# Patient Record
Sex: Male | Born: 1945 | Race: White | Hispanic: No | Marital: Married | Smoking: Former smoker
Health system: Southern US, Community
[De-identification: ages and names within clinical notes are randomized; demographics above are authoritative.]

## PROBLEM LIST (undated history)

## (undated) DIAGNOSIS — T4145XA Adverse effect of unspecified anesthetic, initial encounter: Secondary | ICD-10-CM

## (undated) DIAGNOSIS — G4730 Sleep apnea, unspecified: Secondary | ICD-10-CM

## (undated) DIAGNOSIS — K219 Gastro-esophageal reflux disease without esophagitis: Secondary | ICD-10-CM

## (undated) DIAGNOSIS — F32A Depression, unspecified: Secondary | ICD-10-CM

## (undated) DIAGNOSIS — D751 Secondary polycythemia: Secondary | ICD-10-CM

## (undated) DIAGNOSIS — Z9889 Other specified postprocedural states: Secondary | ICD-10-CM

## (undated) DIAGNOSIS — T8859XA Other complications of anesthesia, initial encounter: Secondary | ICD-10-CM

## (undated) DIAGNOSIS — R0600 Dyspnea, unspecified: Secondary | ICD-10-CM

## (undated) DIAGNOSIS — I2510 Atherosclerotic heart disease of native coronary artery without angina pectoris: Secondary | ICD-10-CM

## (undated) DIAGNOSIS — R112 Nausea with vomiting, unspecified: Secondary | ICD-10-CM

## (undated) DIAGNOSIS — I10 Essential (primary) hypertension: Secondary | ICD-10-CM

## (undated) DIAGNOSIS — R911 Solitary pulmonary nodule: Secondary | ICD-10-CM

## (undated) DIAGNOSIS — Z9109 Other allergy status, other than to drugs and biological substances: Secondary | ICD-10-CM

## (undated) DIAGNOSIS — M1990 Unspecified osteoarthritis, unspecified site: Secondary | ICD-10-CM

## (undated) DIAGNOSIS — F329 Major depressive disorder, single episode, unspecified: Secondary | ICD-10-CM

## (undated) DIAGNOSIS — L719 Rosacea, unspecified: Secondary | ICD-10-CM

## (undated) DIAGNOSIS — E039 Hypothyroidism, unspecified: Secondary | ICD-10-CM

## (undated) HISTORY — DX: Dyspnea, unspecified: R06.00

## (undated) HISTORY — PX: NECK SURGERY: SHX720

## (undated) HISTORY — PX: BACK SURGERY: SHX140

## (undated) HISTORY — DX: Sleep apnea, unspecified: G47.30

## (undated) HISTORY — PX: POSTERIOR FUSION LUMBAR SPINE: SUR632

## (undated) HISTORY — DX: Solitary pulmonary nodule: R91.1

## (undated) HISTORY — DX: Rosacea, unspecified: L71.9

## (undated) HISTORY — PX: ESOPHAGOGASTRODUODENOSCOPY: SHX1529

## (undated) HISTORY — DX: Essential (primary) hypertension: I10

## (undated) HISTORY — DX: Atherosclerotic heart disease of native coronary artery without angina pectoris: I25.10

## (undated) HISTORY — PX: OTHER SURGICAL HISTORY: SHX169

## (undated) HISTORY — DX: Gastro-esophageal reflux disease without esophagitis: K21.9

---

## 1997-06-28 HISTORY — PX: SHOULDER OPEN ROTATOR CUFF REPAIR: SHX2407

## 1998-06-28 HISTORY — PX: PROSTATE SURGERY: SHX751

## 1998-12-22 ENCOUNTER — Ambulatory Visit (HOSPITAL_BASED_OUTPATIENT_CLINIC_OR_DEPARTMENT_OTHER): Admission: RE | Admit: 1998-12-22 | Discharge: 1998-12-22 | Payer: Self-pay | Admitting: Urology

## 2000-08-15 ENCOUNTER — Ambulatory Visit (HOSPITAL_COMMUNITY): Admission: RE | Admit: 2000-08-15 | Discharge: 2000-08-15 | Payer: Self-pay | Admitting: Orthopedic Surgery

## 2000-08-15 ENCOUNTER — Encounter: Payer: Self-pay | Admitting: Orthopedic Surgery

## 2002-04-28 HISTORY — PX: CERVICAL FUSION: SHX112

## 2002-05-03 ENCOUNTER — Encounter: Payer: Self-pay | Admitting: Neurological Surgery

## 2002-05-07 ENCOUNTER — Encounter: Payer: Self-pay | Admitting: Neurological Surgery

## 2002-05-08 ENCOUNTER — Inpatient Hospital Stay (HOSPITAL_COMMUNITY): Admission: AD | Admit: 2002-05-08 | Discharge: 2002-05-10 | Payer: Self-pay | Admitting: Neurological Surgery

## 2002-05-09 ENCOUNTER — Encounter: Payer: Self-pay | Admitting: Neurological Surgery

## 2003-05-16 ENCOUNTER — Ambulatory Visit (HOSPITAL_COMMUNITY): Admission: RE | Admit: 2003-05-16 | Discharge: 2003-05-16 | Payer: Self-pay | Admitting: Neurological Surgery

## 2003-07-02 ENCOUNTER — Inpatient Hospital Stay (HOSPITAL_COMMUNITY): Admission: RE | Admit: 2003-07-02 | Discharge: 2003-07-11 | Payer: Self-pay | Admitting: Neurological Surgery

## 2004-06-28 HISTORY — PX: TRANSURETHRAL RESECTION OF PROSTATE: SHX73

## 2004-08-13 ENCOUNTER — Ambulatory Visit: Payer: Self-pay | Admitting: Internal Medicine

## 2004-08-19 ENCOUNTER — Ambulatory Visit: Payer: Self-pay

## 2004-08-28 ENCOUNTER — Encounter (INDEPENDENT_AMBULATORY_CARE_PROVIDER_SITE_OTHER): Payer: Self-pay | Admitting: Specialist

## 2004-08-28 ENCOUNTER — Inpatient Hospital Stay (HOSPITAL_COMMUNITY): Admission: RE | Admit: 2004-08-28 | Discharge: 2004-09-02 | Payer: Self-pay | Admitting: Urology

## 2005-03-18 ENCOUNTER — Ambulatory Visit: Payer: Self-pay | Admitting: Pulmonary Disease

## 2005-12-22 ENCOUNTER — Ambulatory Visit: Payer: Self-pay | Admitting: Gastroenterology

## 2005-12-26 HISTORY — PX: COLONOSCOPY: SHX174

## 2006-01-13 ENCOUNTER — Ambulatory Visit: Payer: Self-pay | Admitting: Gastroenterology

## 2006-03-16 ENCOUNTER — Ambulatory Visit: Payer: Self-pay

## 2006-06-28 HISTORY — PX: LUMBAR DISC SURGERY: SHX700

## 2006-07-05 ENCOUNTER — Observation Stay (HOSPITAL_COMMUNITY): Admission: RE | Admit: 2006-07-05 | Discharge: 2006-07-07 | Payer: Self-pay | Admitting: Neurological Surgery

## 2007-01-12 ENCOUNTER — Ambulatory Visit (HOSPITAL_COMMUNITY): Admission: RE | Admit: 2007-01-12 | Discharge: 2007-01-12 | Payer: Self-pay | Admitting: Neurological Surgery

## 2007-02-16 ENCOUNTER — Inpatient Hospital Stay (HOSPITAL_COMMUNITY): Admission: RE | Admit: 2007-02-16 | Discharge: 2007-03-01 | Payer: Self-pay | Admitting: Neurological Surgery

## 2007-03-01 ENCOUNTER — Ambulatory Visit: Payer: Self-pay | Admitting: Gastroenterology

## 2007-06-01 ENCOUNTER — Ambulatory Visit: Payer: Self-pay | Admitting: Internal Medicine

## 2007-07-14 DIAGNOSIS — I10 Essential (primary) hypertension: Secondary | ICD-10-CM | POA: Insufficient documentation

## 2007-07-14 DIAGNOSIS — K219 Gastro-esophageal reflux disease without esophagitis: Secondary | ICD-10-CM | POA: Insufficient documentation

## 2008-03-20 ENCOUNTER — Ambulatory Visit: Payer: Self-pay | Admitting: Internal Medicine

## 2008-03-20 DIAGNOSIS — G4733 Obstructive sleep apnea (adult) (pediatric): Secondary | ICD-10-CM | POA: Insufficient documentation

## 2008-03-20 DIAGNOSIS — R0989 Other specified symptoms and signs involving the circulatory and respiratory systems: Secondary | ICD-10-CM | POA: Insufficient documentation

## 2008-03-20 DIAGNOSIS — R635 Abnormal weight gain: Secondary | ICD-10-CM | POA: Insufficient documentation

## 2008-03-20 DIAGNOSIS — R0609 Other forms of dyspnea: Secondary | ICD-10-CM

## 2008-06-28 HISTORY — PX: CATARACT EXTRACTION, BILATERAL: SHX1313

## 2008-07-29 ENCOUNTER — Ambulatory Visit: Payer: Self-pay | Admitting: Internal Medicine

## 2008-08-09 ENCOUNTER — Ambulatory Visit: Payer: Self-pay

## 2008-08-22 ENCOUNTER — Ambulatory Visit: Payer: Self-pay | Admitting: Internal Medicine

## 2008-08-22 LAB — CONVERTED CEMR LAB
Basophils Absolute: 0.1 10*3/uL (ref 0.0–0.1)
Calcium: 9.6 mg/dL (ref 8.4–10.5)
GFR calc Af Amer: 126 mL/min
Glucose, Bld: 120 mg/dL — ABNORMAL HIGH (ref 70–99)
INR: 1 (ref 0.8–1.0)
Lymphocytes Relative: 20.1 % (ref 12.0–46.0)
MCHC: 33.9 g/dL (ref 30.0–36.0)
Monocytes Absolute: 0.7 10*3/uL (ref 0.1–1.0)
Monocytes Relative: 9.1 % (ref 3.0–12.0)
Neutro Abs: 5.3 10*3/uL (ref 1.4–7.7)
Platelets: 241 10*3/uL (ref 150–400)
Potassium: 4.5 meq/L (ref 3.5–5.1)
Prothrombin Time: 11 s (ref 10.9–13.3)
RDW: 15.6 % — ABNORMAL HIGH (ref 11.5–14.6)
Sodium: 138 meq/L (ref 135–145)

## 2008-08-26 ENCOUNTER — Inpatient Hospital Stay (HOSPITAL_BASED_OUTPATIENT_CLINIC_OR_DEPARTMENT_OTHER): Admission: RE | Admit: 2008-08-26 | Discharge: 2008-08-26 | Payer: Self-pay | Admitting: Internal Medicine

## 2008-08-26 ENCOUNTER — Ambulatory Visit: Payer: Self-pay | Admitting: Internal Medicine

## 2008-08-26 HISTORY — PX: MENISCUS REPAIR: SHX5179

## 2008-08-26 HISTORY — PX: CARDIAC CATHETERIZATION: SHX172

## 2008-09-09 ENCOUNTER — Ambulatory Visit: Payer: Self-pay | Admitting: Internal Medicine

## 2008-09-09 ENCOUNTER — Encounter (INDEPENDENT_AMBULATORY_CARE_PROVIDER_SITE_OTHER): Payer: Self-pay | Admitting: *Deleted

## 2008-09-13 ENCOUNTER — Ambulatory Visit (HOSPITAL_COMMUNITY): Admission: RE | Admit: 2008-09-13 | Discharge: 2008-09-14 | Payer: Self-pay | Admitting: Orthopedic Surgery

## 2008-12-06 ENCOUNTER — Encounter: Payer: Self-pay | Admitting: Internal Medicine

## 2009-01-23 ENCOUNTER — Ambulatory Visit (HOSPITAL_COMMUNITY): Admission: RE | Admit: 2009-01-23 | Discharge: 2009-01-23 | Payer: Self-pay | Admitting: Neurological Surgery

## 2009-03-14 ENCOUNTER — Telehealth (INDEPENDENT_AMBULATORY_CARE_PROVIDER_SITE_OTHER): Payer: Self-pay | Admitting: *Deleted

## 2009-04-14 ENCOUNTER — Telehealth (INDEPENDENT_AMBULATORY_CARE_PROVIDER_SITE_OTHER): Payer: Self-pay | Admitting: *Deleted

## 2009-04-16 ENCOUNTER — Encounter (INDEPENDENT_AMBULATORY_CARE_PROVIDER_SITE_OTHER): Payer: Self-pay | Admitting: *Deleted

## 2009-05-26 ENCOUNTER — Inpatient Hospital Stay (HOSPITAL_COMMUNITY): Admission: RE | Admit: 2009-05-26 | Discharge: 2009-06-01 | Payer: Self-pay | Admitting: Neurological Surgery

## 2009-06-18 ENCOUNTER — Telehealth (INDEPENDENT_AMBULATORY_CARE_PROVIDER_SITE_OTHER): Payer: Self-pay | Admitting: *Deleted

## 2009-06-18 ENCOUNTER — Ambulatory Visit: Payer: Self-pay | Admitting: Internal Medicine

## 2009-06-18 DIAGNOSIS — I2510 Atherosclerotic heart disease of native coronary artery without angina pectoris: Secondary | ICD-10-CM | POA: Insufficient documentation

## 2009-06-18 DIAGNOSIS — E782 Mixed hyperlipidemia: Secondary | ICD-10-CM | POA: Insufficient documentation

## 2009-10-14 ENCOUNTER — Ambulatory Visit (HOSPITAL_COMMUNITY): Admission: RE | Admit: 2009-10-14 | Discharge: 2009-10-14 | Payer: Self-pay | Admitting: Neurological Surgery

## 2010-06-08 ENCOUNTER — Encounter: Payer: Self-pay | Admitting: Internal Medicine

## 2010-06-08 ENCOUNTER — Ambulatory Visit: Payer: Self-pay | Admitting: Internal Medicine

## 2010-06-17 ENCOUNTER — Encounter: Payer: Self-pay | Admitting: Internal Medicine

## 2010-07-30 NOTE — Assessment & Plan Note (Signed)
Summary: per check out/sf appt is 11:15/ gd   Primary Provider:  Shaune Pollack  CC:  1 year ROV; Had Pressure 1 month ago.  History of Present Illness: Patient is a 65 year old wiht a history of mild CAD (cath in 2010 with 30% lesions), dyslipidemia, sleep apnea, HTN and GERD.  I saw him 1 year ago. Since I saw him he has had rare atpical CP. Breathin is unchanged.  His biggest complaints are hurting all over.  Activity is limited by musculoskeletal complaints. He has sleep apnea.  Has not tolerated CPAP.  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 Once Daily 2)  Vesicare 10 Mg  Tabs (Solifenacin Succinate) .... Once Daily 3)  Miralax   Pack (Polyethylene Glycol 3350) .... Two Times A Day 4)  Nexium 40 Mg Cpdr (Esomeprazole Magnesium) .[name].. 1 Tab Once Daily 5)  Lisinopril-Hydrochlorothiazide 10-12.5 Mg Tabs (Lisinopril-Hydrochlorothiazide) .[name].. 1 Tab Once Daily 6)  Iron Pills .... 2 Tabs Daily 7)  Androgel Pump 1 % Gel (Testosterone) .... 8 Pumps Daily 8)  Simvastatin 20 Mg Tabs (Simvastatin) .... Take One Tablet By Mouth Daily At Bedtime 9)  Percocet 5-325 Mg Tabs (Oxycodone-Acetaminophen) .... 2 Tabs Q 4 Hours As Needed 10)  Valium 5 Mg Tabs (Diazepam) .[name].. 1 Tab Q 6hours 11)  Aspirin 81 Mg Tbec (Aspirin) .... Take One Tablet By Mouth Daily  Current Medications (verified): 1)  Synthroid 150 Mcg  Tabs (Levothyroxine Sodium) .... Once Daily 2)  Vesicare 10 Mg  Tabs (Solifenacin Succinate) .... Once Daily 3)  Miralax   Pack (Polyethylene Glycol 3350) .... Once Daily 4)  Prilosec 20 Mg Cpdr (Omeprazole) .... Take 1 By Mouth Once Daily 5)  Lisinopril-Hydrochlorothiazide 10-12.5 Mg  Tabs (Lisinopril-Hydrochlorothiazide) .[name].. 1 Tab Once Daily 6)  Iron Pills .... 2 Tabs Daily 7)  Androgel Pump 1 % Gel (Testosterone) .... 8 Pumps Daily 8)  Simvastatin 20 Mg Tabs (Simvastatin) .... Take One Tablet By Mouth Daily At Bedtime 9)  Vicodin 5-500 Mg Tabs (Hydrocodone-Acetaminophen) .... Take 1 By Mouth As Needed 10)  Aspirin 81 Mg Tbec (Aspirin) .... Take One Tablet By Mouth Two Times A Day 11)  Citalopram Hydrobromide 20 Mg Tabs (Citalopram Hydrobromide) .... Take 1 By Mouth Once Daily  Allergies: 1)  ! Sulfa 2)  ! Penicillin 3)  ! Codeine  Past History:  Past medical, surgical, family and social histories (including risk factors) reviewed, and no changes noted (except as noted below).  Past Medical History: Reviewed history from 06/18/2009 and no changes required.  OBSTRUCTIVE SLEEP APNEA (ICD-327.23)     - can't wear cpap HYPERTENSION (ICD-401.9) G E R D (ICD-530.81) Dyspnea   - PFTs completely normal 03/20/08 including DLC0 Chest pain CAD  Past Surgical History: Reviewed history from 07/14/2007 and no changes required. history of neck and back fusions 3 prostate surgeries hx of shoulder surgery  Family History: Reviewed history from 03/20/2008 and no changes required. neg resp dz  Social History: Reviewed history from 03/20/2008 and no changes required. Former smoker.  Quit in 1980's.  Smoked for 15 years 1 to 2 ppd. Married Works for [employer]  Review of [employer]  ALl systems reviewed.  Neg to the above problem except as noted above.  Vital Signs:  Patient profile:   65 year old male Height:      71 inches Weight:      259 pounds BMI:     36.25 Pulse rate:   80 / minute Pulse rhythm:   regular BP sitting:   120 / 90  (right arm) Cuff size:   regular  Vitals Entered By: Stanton Kidney, EMT-P (June 08, 2010 10:55 AM)  Physical Exam  Additional Exam:  Patient is in NAD HEENT:  Normocephalic, atraumatic. EOMI, PERRLA.  Neck:  JVP is normal. Lungs: clear to auscultation. No rales no wheezes.  Heart: Regular rate and rhythm. Normal S1, S2. No S3.   No significant murmurs. PMI not displaced.  Abdomen:  Supple, nontender. Normal bowel sounds. No masses. No hepatomegaly.  Extremities:   Good distal pulses throughout. No lower extremity edema.  Neuro:   alert and oriented x3.    EKG  Procedure date:  06/08/2010  Findings:      NSR.  80 bpm.    Occas PAC.  Impression & Recommendations:  Problem # 1:  CAD, NATIVE VESSEL (ICD-414.01) Mild by cath.  I am not convinced he is having any active angina.  No change in regimen.  Problem # 2:  HYPERLIPIDEMIA-MIXED (ICD-272.4) Assessment: Improved Will need to f/u on lipids. His updated medication list for this problem includes:    Simvastatin 20 Mg Tabs (Simvastatin) .[name]... Take one tablet by mouth daily at bedtime  Orders: EKG w/ Interpretation (93000)  Problem # 3:  OBSTRUCTIVE SLEEP APNEA (ICD-327.23) Encouraged him to try to use CPAP.    Problem # 4:  HYPERTENSION (ICD-401.9) Diastolic at 90.  Systolic good.  Would not make changes for now.  Follow. His updated medication list for this problem includes:    Lisinopril-hydrochlorothiazide 10-12.5 Mg Tabs (Lisinopril-hydrochlorothiazide) .[name]... 1 tab once daily    Aspirin 81 Mg Tbec (Aspirin) .[name]... Take one tablet by mouth two times a day  Patient Instructions: 1)  Your physician recommends that you schedule a follow-up appointment in: 1 year with Dr. Tenny Craw 2)  Your physician recommends that you have lab work drawn with  primary care doctor. 3)  Your physician recommends that you continue on your current medications as directed. Please refer to the Current Medication list given to you today. Prescriptions: SIMVASTATIN 20 MG TABS (SIMVASTATIN) Take one tablet by mouth daily at bedtime  #90 x 3   Entered by:   Lisabeth Devoid RN   Authorized by:   Sherrill Raring, MD, [facility]   Signed by:   Lisabeth Devoid RN on  06/08/2010   Method used:   Faxed to ...       [facility] (mail-order)             , [place]         Ph: [phone]       Fax: [phone]   RxID:   [id number]

## 2010-09-17 ENCOUNTER — Other Ambulatory Visit: Payer: Self-pay | Admitting: Oncology

## 2010-09-17 ENCOUNTER — Encounter (HOSPITAL_BASED_OUTPATIENT_CLINIC_OR_DEPARTMENT_OTHER): Payer: 59 | Admitting: Oncology

## 2010-09-17 DIAGNOSIS — D751 Secondary polycythemia: Secondary | ICD-10-CM

## 2010-09-17 LAB — CBC WITH DIFFERENTIAL/PLATELET
BASO%: 0.3 % (ref 0.0–2.0)
EOS%: 1.9 % (ref 0.0–7.0)
HGB: 16.5 g/dL (ref 13.0–17.1)
MCH: 32.3 pg (ref 27.2–33.4)
MCHC: 33.4 g/dL (ref 32.0–36.0)
RDW: 14.1 % (ref 11.0–14.6)
WBC: 5.9 10*3/uL (ref 4.0–10.3)
lymph#: 1 10*3/uL (ref 0.9–3.3)

## 2010-09-18 LAB — COMPREHENSIVE METABOLIC PANEL
ALT: 18 U/L (ref 0–53)
AST: 18 U/L (ref 0–37)
Albumin: 4 g/dL (ref 3.5–5.2)
Alkaline Phosphatase: 56 U/L (ref 39–117)
Potassium: 4.3 mEq/L (ref 3.5–5.3)
Sodium: 135 mEq/L (ref 135–145)
Total Protein: 7.3 g/dL (ref 6.0–8.3)

## 2010-09-29 LAB — CBC
HCT: 39.9 % (ref 39.0–52.0)
Hemoglobin: 13.4 g/dL (ref 13.0–17.0)
RBC: 4.49 MIL/uL (ref 4.22–5.81)
WBC: 10.3 10*3/uL (ref 4.0–10.5)

## 2010-09-29 LAB — BASIC METABOLIC PANEL
Calcium: 8 mg/dL — ABNORMAL LOW (ref 8.4–10.5)
GFR calc Af Amer: >60 mL/min (ref >60)
GFR calc non Af Amer: >60 mL/min (ref >60)
Glucose, Bld: 114 mg/dL — ABNORMAL HIGH (ref 70–99)
Potassium: 4.1 mEq/L (ref 3.5–5.1)
Sodium: 132 mEq/L — ABNORMAL LOW (ref 135–145)

## 2010-09-30 LAB — CBC
HCT: 43.5 % (ref 39.0–52.0)
Hemoglobin: 14.3 g/dL (ref 13.0–17.0)
Platelets: 210 10*3/uL (ref 150–400)
RBC: 4.93 MIL/uL (ref 4.22–5.81)
RDW: 18.9 % — ABNORMAL HIGH (ref 11.5–15.5)

## 2010-09-30 LAB — BASIC METABOLIC PANEL
CO2: 27 mEq/L (ref 19–32)
GFR calc Af Amer: >60 mL/min (ref >60)
GFR calc non Af Amer: >60 mL/min (ref >60)
Glucose, Bld: 99 mg/dL (ref 70–99)
Potassium: 4.3 mEq/L (ref 3.5–5.1)
Sodium: 137 mEq/L (ref 135–145)

## 2010-09-30 LAB — COMPREHENSIVE METABOLIC PANEL
AST: 27 U/L (ref 0–37)
Albumin: 3.9 g/dL (ref 3.5–5.2)
Alkaline Phosphatase: 63 U/L (ref 39–117)
Chloride: 103 mEq/L (ref 96–112)
GFR calc Af Amer: >60 mL/min (ref >60)
Potassium: 4.8 mEq/L (ref 3.5–5.1)
Total Bilirubin: 0.9 mg/dL (ref 0.3–1.2)

## 2010-09-30 LAB — TYPE AND SCREEN

## 2010-10-08 LAB — BASIC METABOLIC PANEL
BUN: 14 mg/dL (ref 6–23)
CO2: 27 mEq/L (ref 19–32)
GFR calc non Af Amer: >60 mL/min (ref >60)
Glucose, Bld: 103 mg/dL — ABNORMAL HIGH (ref 70–99)
Potassium: 4.3 mEq/L (ref 3.5–5.1)

## 2010-10-08 LAB — CBC
HCT: 42.7 % (ref 39.0–52.0)
MCHC: 32.8 g/dL (ref 30.0–36.0)
MCV: 82.8 fL (ref 78.0–100.0)
Platelets: 258 10*3/uL (ref 150–400)
RDW: 16.4 % — ABNORMAL HIGH (ref 11.5–15.5)

## 2010-11-10 NOTE — Discharge Summary (Signed)
NAMENAJEE, [name] [name]                 ACCOUNT NO.:  [id number]   MEDICAL RECORD NO.:  [id number]          PATIENT TYPE:  INP   LOCATION:  5114                         FACILITY:  [facility]   PHYSICIAN:  Stefani Dama, M.D.  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   FINAL DIAGNOSES:  1. Lumbar spondylosis and stenosis L3 to L5, status post arthrodesis      L5-S1 secondary to spondylolisthesis with lumbar radiculopathy and      myelopathy  2. Postoperative paralytic ileus.  3. Postoperative nausea.   HOSPITAL COURSE:  The patient is a 65 year old individual who has had  previous difficulties with lumbar spondylosis and spondylolisthesis,  having undergone a fusion at the L5-S1 level.  He developed increasing  lumbar radiculopathy and had a herniated nucleus pulposus at L3-L4 which  was operated on approximately eight months ago.  He was improving,  albeit very slowly and has had complaints of chronic radiculopathy.  Attempts to return him to the workplace were unsuccessful and the  patient has had increasing continual back pain for the past several  months.  Followup studies demonstrated that he was now developing  significant spondylitic changes at the L3-4 level and also at L4-5.  He  has had a moderate degree of stenosis that seemed to be worsening from  his previous MRIs.  After careful consideration of his options, I  advised regarding surgical decompression and arthrodesis from L3 down to  L5, thus he would have an arthrodesis from L3 to the sacrum.  Previously, the patient had had difficulties with postop paralytic  ileus, and after this the surgery, the condition was quite the same and  quite severe.  He was seen by the GI service and had a  gastrointestinal  consult where he was given multiple laxatives and enemas in an effort to  try to stimulate mobility in his bowels.  At the same time, he was  weaned as best possible from his narcotic pain medications.  He was  ambulated, albeit the patient's motivation in regards to ambulation was  quite limited.  Nonetheless, it took considerable amount of time for the  patient's bowels ultimately to breakthrough, and on the 2nd, he  complained significantly of having diarrhea stools.  Thing seemed to  improve, and by the morning of the 3rd, his diarrhea had largely  subsided.   His incision is clean and dry and healing well.  His motor function has  remained stable, although he feels generally weak.  He does have some  postoperative anemia with hematocrit down to 29.  He did not receive any  transfusions during this hospital stay.  Overall, we are pleased with  his progress and it is anticipated that he should have further smoother  recovery.   DISCHARGE MEDICATIONS:  He is discharged home with prescription for:  1. Percocet #60, without  refills.  2. Valium 5 mg, #40, with two refills.  3. Robaxin #40, 500 mg, with p.r.n. refills for muscle spasms.  4. MiraLax as a laxative to use twice a day.   DISCHARGE INSTRUCTIONS:  In addition, he will be seen by physical  therapy and occupational therapy after his discharged to home place.   CONDITION ON DISCHARGE:  Stable.      Stefani Dama, M.D.  Electronically Signed     HJE/MEDQ  D:  03/01/2007  T:  03/01/2007  Job:  [id number]

## 2010-11-10 NOTE — Assessment & Plan Note (Signed)
[facility]                             PULMONARY OFFICE NOTE   NAME:[name] [name], [name] [name]                        MRN:          [id number]  DATE:06/01/2007                            DOB:          02/23/1946    PULMONARY CONSULTATION   DATE OF VISIT:  June 01, 2007.   REQUESTING PHYSICIAN:  Duncan Dull, MD.   REASON FOR CONSULTATION:  Dyspnea.   HISTORY:  This is a 65 year old, white male, who quit smoking 30 years  ago and weighed about 180 pounds then.  He weighs 260 pounds now and  comes in with a history of an increasing cough and dyspnea since June of  2008, to the point where he is having trouble getting up a flight of  steps without stopping, although he can still do so without stopping at  the top.  He was much worse two weeks ago when Dr. Kevan Ny switched him  from Lisinopril to Benicar and he is some better today.  The cough is  dry in nature, worse as the day goes on, does not typically disturb his  sleep.  He denies any overt sinus or reflux symptoms, fevers, chills,  sweats, orthopnea, PND, or leg swelling.   PAST MEDICAL HISTORY:  1. Hypertension.  2. GERD complicated by a previous stricture needing dilatation.  3. He also is on a CPAP machine.   ALLERGIES:  1. PENICILLIN.  2. SULFA.  3. CODEINE.   MEDICATIONS:  Synthroid, Nexium, Benicar, Vesicare, aspirin, and  MiraLax.  For full medication dosing, please see medication face sheet  accommodated June 01, 2007.   SOCIAL HISTORY:  He quit smoking over 30 years ago, works for the [employer].   FAMILY HISTORY:  Taken in detail on the worksheet and negative for  atypia or asthma.   REVIEW OF SYSTEMS:  Taken also today in detail on the worksheet and  negative except as outlined above.   PHYSICAL EXAMINATION:  GENERAL:  This is an obese, ambulatory, white  male in no acute distress.  HEENT:  Unremarkable, oropharynx clear.  He does have frequent throat  clearing,  however, and slight hoarseness with voice fatigue.  Nasal  turbinates reveal moderate edema with mucopurulent secretions.  NECK:  Supple without cervical adenopathy or tenderness, trachea is  midline.  CHEST:  The lung fields reveal decreased breath sounds, but no wheeze.  There is no cough elicited on inspiratory or expiratory maneuvers.  HEART:  There is a regular rhythm with a distant S1 S2.  ABDOMEN:  Massively obese as well as benign with a positive end  inspiratory Hoover's sign.  EXTREMITIES:  Warm without calf tenderness, cyanosis, clubbing, or  edema.   LABORATORY DATA:  His office records were reviewed from November 14th  and indicate that is when the change was made to Benicar.   IMPRESSION:  1. New onset cough in a patient, who has documented reflux complicated      by stricture and occurred while the patient was on ACE inhibitors.      While not documented  in the literature, I have found that reflux      and ACE inhibitors go together like gasoline and matches.  Neither      is a problem until the match is struck by something like an      endotracheal tube or a viral upper respiratory infection, and then      the inflammation in the upper airway that ensues this can be quite      problematic.   I therefore am not only recommending elimination of ACE inhibitors  indefinitely but continued aggressive treatment directed at reflux with  Nexium b.i.d. and a diet that I reviewed with him.   1. Clearly some of his chronic dyspnea is related to obesity with      deconditioning and may be related to chronic obstructive pulmonary      disease.  I have asked him to return in four to six weeks for a set      of pulmonary function tests to sort through this and emphasized      paced exercise as a way to begin to overcome this problem.  I do      not believe any bronchodilators at this point are necessary and may      risk aggravating the upper airway component of the cough.      Charlaine Dalton. Sherene Sires, MD, [facility]  Electronically Signed    MBW/MedQ  DD: 06/01/2007  DT: 06/01/2007  Job #: [id number]   cc:   Duncan Dull, M.D.

## 2010-11-10 NOTE — Op Note (Signed)
NAME:  [name] [name], [name] [name]                 ACCOUNT NO.:  [id number]   MEDICAL RECORD NO.:  [id number]          PATIENT TYPE:  OIB   LOCATION:  5012                         FACILITY:  [facility]   PHYSICIAN:  Dyke Brackett, M.D.    DATE OF BIRTH:  1946/06/26   DATE OF PROCEDURE:  09/13/2008  DATE OF DISCHARGE:                               OPERATIVE REPORT   INDICATIONS:  A 65 year old with intractable knee pain.  MRI showing  tear of medial meniscus of the left knee with osteoarthritis, requiring  inpatient evaluation due to severe sleep apnea.   PREOPERATIVE DIAGNOSES:  1. Complex tear, posterior horn of the medial meniscus, left knee.  2. Patellofemoral chondromalacia, grade 3.  3. Grade 3 chondromalacia of medial compartment.   OPERATION:  1. Partial medial meniscectomy.  2. Debridement and chondroplasty of patellofemoral joint.  3. Debridement and chondroplasty of medial compartment.   SURGEON:  Dyke Brackett, MD   ANESTHESIA:  General with local.   Arthroscopic portals were created inferomedially and inferolaterally in  the leg holders.  The patellofemoral joint showed some generalized  chondromalacia, early grade 3 changes more on the medial side, some  involvement of the trochlear groove.  This was debrided and separated  from the medial compartment, lateral compartment and lateral meniscus  was normal.  Medial compartment showed a complex tear of the posterior  horn of the meniscus, resection 30-40%, meniscus substance with  generalized advanced grade 3 changes on the most of the weightbearing  surface of the femur with a less involvement, but still with some grade  3 changes on the tibia.  Again, two compartments were debrided that are  patellofemoral joint and medial compartment.  This was partially medial  meniscectomy __________closed portals  with nylon.  Portals were  injected with Marcaine with addition of 40 mg of Depo-Medrol into the  joint for a total of 30 mL, 0.5%  Marcaine with epinephrine.  Taken to  recovery room in stable condition,  after light compressive sterile  dressing applied.      Dyke Brackett, M.D.  Electronically Signed     WDC/MEDQ  D:  09/13/2008  T:  09/14/2008  Job:  [id number]

## 2010-11-10 NOTE — Assessment & Plan Note (Signed)
[facility]                            CARDIOLOGY OFFICE NOTE   NAME:[name] [name], [name] [name]                        MRN:          [id number]  DATE:07/29/2008                            DOB:          09-15-1945    IDENTIFICATION:  [name] [name] is a 65 year old gentleman who I saw last  time back in 2006.  He had a history of intermittent chest discomfort.  He returns today in referral from Dr. Kevan Ny for evaluation of chest  pains.   On talking to him, he is vagued, but he states that he occasionally feel  a vibrating sensation in his chest almost like he has a cellphone and  they are on vibrate.  Denies dizziness.  He is not that active because  of musculoskeletal issues.  Denies any episodes that wake him.  I do not  get a clear sense if there is an association with activity.   CURRENT MEDICINES:  1. Synthroid daily.  2. VESIcare daily.  3. Aspirin 81.  4. MiraLax daily.  5. Prilosec daily.  6. Lisinopril HCTZ 10/12.5.  7. Etodolac 500 b.i.d.  8. Z-PAK p.r.n.   PHYSICAL EXAMINATION:  GENERAL:  The patient is in no distress.  VITAL SIGNS:  Blood pressure 119/71, pulse is 80 and regular, and weight  248.  LUNGS:  Clear with good air flow.  No rales.  CARDIAC:  Regular rate and rhythm, S1 and S2.  No S3, no significant  murmurs.  ABDOMEN:  Benign.  EXTREMITIES:  No edema.   A 12-lead EKG; normal sinus rhythm at 78 beats per minute, left anterior  fascicular block, LVH.   IMPRESSION:  #1.  Chest pain.  The patient with no known coronary artery  disease.  My last Myoview in 2006 was unchanged from the one in 2003  shows inferior thinning defect consistent with scar with mild peri-  infarct ischemia may indeed note that there may be some soft tissue  attenuation.   I would recommend getting another adenosine Myoview.  The patient's pain  is atypical, but would schedule to reevaluate and compare.  He is being  scheduled for knee surgery and this will  be prior to preop risk  stratification.   I would keep him on his current regimen.  #2.  Hypertension, adequately controlled.  #3.  Cardiac risk factors.  He will be in touch with Dr. Kevan Ny' office  regarding lipid panel.     Pricilla Riffle, MD, [facility]  Electronically Signed    PVR/MedQ  DD: 07/29/2008  DT: 07/30/2008  Job #: [id number]   cc:   Duncan Dull, M.D.

## 2010-11-10 NOTE — Cardiovascular Report (Signed)
NAMEHAROUN, COTHAM                 ACCOUNT NO.:  [id number]   MEDICAL RECORD NO.:  [id number]          PATIENT TYPE:  OIB   LOCATION:  1962                         FACILITY:  [facility]   PHYSICIAN:  Bevelyn Buckles. Bensimhon, MDDATE OF BIRTH:  03/08/1946   DATE OF PROCEDURE:  08/26/2008  DATE OF DISCHARGE:  08/26/2008                            CARDIAC CATHETERIZATION   PRIMARY CARDIOLOGIST:  Pricilla Riffle, MD, [facility]   THE PATIENT IDENTIFICATION:  Mr. Pavek is a 65 year old male with  history of hypertension and sleep apnea.  He has been having  intermittent chest discomfort.  He saw Dr. Tenny Craw, underwent adenosine  Myoview on June 08, 2008.  This showed evidence of an inferior wall  scar with some mild peri-infarct ischemia.  He is thus sent for a  diagnostic catheterization in the outpatient lab.   PROCEDURES PERFORMED:  1. Selective coronary angiography.  2. Left heart catheterization.  3. Left ventriculogram.   DESCRIPTION OF THE PROCEDURE:  The risks and indication were explained,  consent was signed and placed on the chart, the right groin area was  prepped and draped in routine sterile fashion, anesthetized with 1%  local lidocaine.  A 4-French arterial sheath was placed in the right  femoral artery using a modified Seldinger technique.  A JL5, 3DRC, and  angled pigtail catheter were used for the procedure.  All catheter  exchanges were made over wire.  There are no apparent complications.   Central aortic pressure was 111/72 with a mean of 90.  LV pressure was  109/5 and EDP of 10.  There was no aortic stenosis.   Left main was normal.   LAD was a long vessel, wrapping the apex, gave off to moderate-sized  diagonals.  There is a long 30% tubular lesion in the midsection.   Left circumflex gave off a large ramus and 2 small OMs.  They were  angiographically normal.   Right coronary artery was a large dominant vessel, gave off a PDA and 2  posterolaterals.  There is a 30%  lesion in the proximal RCA.   Left ventriculogram done in the RAO position showed an EF of 55-60%.  There was some question of a mild wall motion defect in the mid anterior  wall.  Otherwise, I suspect this is just related to ectopy.   ASSESSMENT:  1. Mild nonobstructive coronary artery disease.  2. Normal left ventricular function.   PLAN:  Will be for medical therapy with continued risk factor  management.  He will follow up with Dr. Tenny Craw.      Bevelyn Buckles. Bensimhon, MD  Electronically Signed     DRB/MEDQ  D:  08/26/2008  T:  08/26/2008  Job:  [id number]

## 2010-11-10 NOTE — Op Note (Signed)
NAMECHRISTOPHR, [name] [name]                 ACCOUNT NO.:  [id number]   MEDICAL RECORD NO.:  [id number]          PATIENT TYPE:  INP   LOCATION:  3108                         FACILITY:  [facility]   PHYSICIAN:  Stefani Dama, M.D.  DATE OF BIRTH:  02/23/1946   DATE OF PROCEDURE:  02/16/2007  DATE OF DISCHARGE:                               OPERATIVE REPORT   PREOPERATIVE DIAGNOSIS:  Spondylosis and stenosis L3-L4 and L4-L5 status  post arthrodesis L5-S1.   POSTOPERATIVE DIAGNOSIS:  Spondylosis and stenosis L3-L4 and L4-L5  status post arthrodesis L5-S1.   OPERATION:  Decompression L3-L4 and L4-L5 via laminectomy of L3 and L4,  total discectomy L3-L4 and L4-L5, decompress L3, L4, and L5 nerve roots  bilaterally, posterior interbody arthrodesis L3-L4 and L4-L5 with PEEK  spacers, local autograft and allograft, segmental fixation L3 to L5,  posterolateral arthrodesis L3 to L5 with local autograft and allograft,  removal of previous hardware L5-S1.   SURGEON:  Stefani Dama, M.D.   FIRST ASSISTANT:  Cristi Loron, M.D.   ANESTHESIA:  General endotracheal.   INDICATIONS:  [name] [name] is a 65 year old individual who has had  significant problems with his back in the past.  He has advanced  spondylitic changes with stenosis in the lateral recess, nerve root  compromise at the L5-S1 level, and underwent fusion, developed a  herniated nucleus pulposus at L3-L4 causing a severe left lumbar  radiculopathy.  This had been decompressed and since that time, the  patient has developed additional spondylitic changes with retrolisthesis  of L3-L4 causing significant stenosis at that level and spondylitic  changes at L4-L5 with, again, lateral recess stenosis noted there.  The  patient was advised regarding surgical decompression and is taken to the  operating room now to undergo decompression and stabilization from L3 to  L5 with removal of the hardware.   PROCEDURE:  The patient was brought to the  operating room supine on the  stretcher. After the smooth induction of general endotracheal  anesthesia, he was turned prone.  The back was prepped with alcohol and  DuraPrep and draped in a sterile fashion.  A midline incision was  creating and carried down to the lumbodorsal fascia which was opened on  either side of the midline to expose the L3 lamina and L4 lamina in  addition to exposing the hardware at L5-S1.  This was Synthes hardware  that was placed a number of years ago and the hardware was uncovered and  then the central portion of the screws were loosened, rods were removed,  and then the pedicle screws were removed.  The fusion was noted be solid  at L5-S1 and then we proceeded with the laminectomy to decompress L3, L4  and also the L5 nerve roots.   Once the laminectomies were removed, a complete facetectomy was  performed at the L3-L4 and L4-L5 spaces and the disc space was then  entered, first on the left side where there had been a previous  extraforaminal discectomy, then the bulk of the retrolisthesis was noted  at the L3-L4 level.  This area was decompressed and a complete  discectomy was performed on the left side and a spacer was then placed  into the disc space.  This was an 8 mm spacer.  Discectomy was then  performed at L4-L5 on the left side and a similar 8 mm spacer could be  placed into this disc space which was noted to be markedly deteriorated.  Right sided discectomies were then performed removing the bulk of the  disc material from within the disc space and clearing over to the  opposite side.  Disc shapers were then used to remove cartilaginous  endplate and to size the intervertebral space at the L3-L4 level, 8 mm  PEEK spacers could be placed into the interspace, and these were then  prepared, filled with a combination of autograft and some strips of  Infuse.  The spacers were then placed into the interspace at L3-L4. At  L4-L5, 9 mm spacers were placed  in same fashion after complete  discectomy was performed.   Once the interbody portion of the procedure was completed, additional  bone graft was placed into the interbody space along with some small  strips of Infuse. Once this was completed, care was taken to again check  that the L3, the L4, and the L5 nerve roots were each well decompressed.  Then, using fluoroscopic guidance, pedicle screws were placed in L3, L4,  and L5.  These were 6.5 x 50 mm screws.  The left sided screw in L5 was  re-tapped more medially to obtain better bony purchase and a 6.5 x 50 mm  screw was placed into this pedicle.  On the right side, the previously  used pedicle opening was secured again with 50 mm 6.5 diameter screw. 60  mm pre-contoured rods were then fitted between the screw heads and  provisionally tightened.  Fluoroscopic images were obtained in AP and  lateral projections. Prior to placement of the hardware, the lateral  gutters had been decorticated and were packed off with sponges.  The  sponges had been removed.  At this time, additional Infuse and autograft  was placed into the lateral gutters.  The system was then tightened  completely and torqued into position.  A transverse connector was placed  at the bottom at L4-L5.   With the hardware being placed, the area was checked carefully for  hemostasis.  Once this was secured, the lumbodorsal fascia was closed  with #1 Vicryl in an interrupted fashion, 2-0 Vicryl was used in the  subcutaneous tissues, and 3-0 Vicryl subcuticularly.  A dry sterile  dressing was placed on the patient's back.  Blood loss was estimated at  600 mL, 200 mL of Cell Saver blood was returned to the patient.      Stefani Dama, M.D.  Electronically Signed     HJE/MEDQ  D:  02/16/2007  T:  02/17/2007  Job:  [id number]

## 2010-11-10 NOTE — Assessment & Plan Note (Signed)
[name]                            CARDIOLOGY OFFICE NOTE   NAME:Babiarz, [name] [name]                        MRN:          [id number]  DATE:09/09/2008                            DOB:          12-18-1945    IDENTIFICATION:  [name] is a 65 year old gentleman.  He had a  history of intermittent chest discomfort, hypertension.  I last saw him  in early February.   The patient complained of some chest tightness, vibrating sensation.  With this and his lack of activity because of DJD, I scheduled him for a  Myoview scan.  This was done on August 09, 2008, that showed inferior  defect consistent with mild scar with mild-to-moderate peri-infarct  ischemia.  Note, he had had some of these changes in the past.   Based on these findings, I went ahead and recommend a cardiac  catheterization to define his anatomy, especially since he was being  evaluated for a knee surgery.   Cardiac catheterization was done on August 26, 2008.  This showed a 30%  RCA lesion, 30% LAD lesion.  LVEF was normal.   Since seen, the patient has been doing okay.  Denies chest pain.   CURRENT MEDICINES:  1. Synthroid 150.  2. VESIcare 10.  3. Aspirin on hold.  4. MiraLax daily.  5. Prilosec daily.  6. Lisinopril hydrochlorothiazide 10/12.5 daily.  7. VPAP (the patient cannot tolerate).   PHYSICAL EXAMINATION:  GENERAL:  On exam, the patient is in no distress.  VITAL SIGNS:  Blood pressure is 118/72, pulse is 76 and regular, weight  249.  NECK:  JVP is normal.  LUNGS:  Clear with no rales or wheezes.  CARDIAC:  Regular rate and rhythm, S1 and S2.  No S3, no significant  murmurs.  ABDOMEN:  Benign.  Obese.  EXTREMITIES:  No edema.   IMPRESSION:  1. Chest pains.  Cath with minimal disease.  I would began him on a      statin, Zocor 20 to prevent progression, check lipids in 2-1/2      months (note in December, his LDL was 1034, HDL of 38).  Activity      will be  difficult with his back problems.  2. Hypertension, good control.  3. Sleep apnea.  The patient was seen by Dr. Fraser Din in the past.  He      is not tolerating the VPAP.  I offered that he be seen by one of      our pulmonologist after his orthopedic work done.  I do think it is      important.  Examination does show prominent tonsils not occluding      the upper airway.  He has been seen by Dr. Haroldine Laws also for      consideration for surgery.  No decision was made.   The patient is holding aspirin because of the upcoming surgery.   I will set to see him back in December but will be in touch with him  regarding blood work.     Pricilla Riffle, MD, [facility]  Electronically Signed    PVR/MedQ  DD: 09/09/2008  DT: 09/10/2008  Job #: [id number]

## 2010-11-10 NOTE — Assessment & Plan Note (Signed)
[facility]                            CARDIOLOGY OFFICE NOTE   NAME:Massey, YESENIA FONTENETTE                        MRN:          [id number]  DATE:08/21/2008                            DOB:          [birth year]    IDENTIFICATION:  Mr. Vasek is a 65 year old gentleman who I followed  in the past.  He has a history of intermittent chest pain, hypertension,  hypothyroidism.  He also has a history of sleep apnea.   I saw the patient on February 1 of this year.  He was complaining of  some vague vibrating sensations in his chest, did not say it was a chest  pain.  He is not that active, limited because of musculoskeletal issues.  I do not get a clear sense there was an association with activity.   With this, I went ahead and scheduled him for an adenosine Myoview.  He  had this done on June 08, 2009.  There were no EKG changes with  infusion.  Myoview scan showed a scar in the inferior wall with some  evidence of peri-infarct ischemia, mild to moderate.  Note, he has had  decreased counts in the inferior wall in the past that had been  attributed to scar and possible soft tissue attenuation without  significant ischemia.  Comparison of these three studies, scan from  February 12, improvement was prominent.   Based on this, we will plan for cardiac catheterization to define his  anatomy once in for all.   ALLERGIES:  CODEINE, SULFA, PENICILLIN.   CURRENT MEDICINES:  1. Synthroid 150 mcg.  2. VESIcare 10.  3. Aspirin 81.  4. MiraLax.  5. Prilosec.  6. Lisinopril/hydrochlorothiazide 10/12.5.  7. Etodolac 500 b.i.d.  8. VPAP.  9. Vicodin p.r.n.   PAST MEDICAL HISTORY:  1. Hypothyroidism.  2. DJD.  3. History of hypertension.  4. History of sleep apnea.   SOCIAL HISTORY:  The patient is married, works, quit smoking almost 35  years ago, does not drink.   FAMILY HISTORY:  Father had a heart attack in his 50s.  Mother with  breast cancer.  No other  history of premature CAD.   REVIEW OF SYSTEMS:  All systems reviewed negative to the above problems  except as noted above.   PHYSICAL EXAMINATION:  VITAL SIGNS:  When I saw him on February 1, blood  pressure was 119/71, pulse was 80 and regular, weight 248 pounds.  HEENT:  Normocephalic, atraumatic, EOMI, PERRL.  NECK:  JVP was normal.  LUNGS:  Clear.  CARDIAC:  Regular rate and rhythm.  S1 and S2.  No S3.  No murmurs.  ABDOMEN:  Benign.  EXTREMITIES:  Good distal pulses.  No lower extremity edema.  NEURO:  Deferred.   A 12-lead EKG showed normal sinus rhythm, 78 beats per minute, left  anterior fascicular block.  LVH.   IMPRESSION:  Mr. Hreha is a 65 year old gentleman with no known  coronary disease.  He has had abnormal Myoview in the past without  definite ischemia but inferior changes consistent with scar, possible  soft  tissue attenuation.  He was complaining of vibrating sensation in  his chest.  To clarify this, another Myoview was done.  This one shows  more improvement in the inferior wall.   Based on this, I would recommend a cardiac catheterization to finally  define his anatomy.  Discussed this with the patient.  He is agreeable.  We will plan to have pre-cath labs done this week and a chest x-Elih, set  catheterization for Monday.     Pricilla Riffle, MD, [facility]  Electronically Signed    PVR/MedQ  DD: 08/21/2008  DT: 08/22/2008  Job #: [id number]

## 2010-11-13 NOTE — Op Note (Signed)
NAME:  [name], [name] [name]                           ACCOUNT NO.:  [id number]   MEDICAL RECORD NO.:  [id number]                   PATIENT TYPE:  OIB   LOCATION:  2894                                 FACILITY:  [facility]   PHYSICIAN:  Stefani Dama, M.D.               DATE OF BIRTH:  May 02, 1946   DATE OF PROCEDURE:  05/07/2002  DATE OF DISCHARGE:                                 OPERATIVE REPORT   PREOPERATIVE DIAGNOSES:  Cervical spondylosis with myelopathy, C3-4.  Cervical radiculopathy.   POSTOPERATIVE DIAGNOSES:  Cervical spondylosis with myelopathy, C3-4.  Cervical radiculopathy.   OPERATION PERFORMED:  Anterior cervical decompression, arthrodesis with  structural allograft, Synthes plate fixation.   SURGEON:  Stefani Dama, M.D.   ASSISTANT:  Hilda Lias, M.D.   ANESTHESIA:  General endotracheal.   INDICATIONS FOR PROCEDURE:  The patient is a 65 year old individual who has  had significant neck, shoulder and arm pain. He was found to have myopathic  cord compression at the level of C3 and C4.  He also had some mild  spondylosis elsewhere in the cervical spine including C5-C6.  Because of his  ongoing problems with dysesthesias and severe localized neck pain.  He was  advised regarding decompression of the cord at 3-4.   DESCRIPTION OF PROCEDURE:  The patient was brought to the operating room  supine on the stretcher.  After smooth induction of general endotracheal  anesthesia, he was placed in five pounds of halter traction.  The neck was  shaved, prepped with DuraPrep and draped in sterile fashion.  A transverse  incision was created in the upper portion of the neck and this was carried  down to the platysma.  The plain between the sternocleidomastoid muscle and  strap muscles was dissected bluntly until the first prevertebral space was  reached.  This was identified as C3-4.  The longus colli muscle was stripped  on either side so as to place a Agricultural engineer.  The  ventral aspect of C3-  4 was opened with a small rongeur and the disk space was noted to be  collapsed anteriorly.  A high speed air drill and a 2.3 mm dissecting tool  was used to open up this ventral aspect of the disk space.  Then a  significant quantity of severely degenerated disk material was removed.  Bony osteophytic overgrowth was encountered.  A self-retaining disk spreader  could be placed first on the right side then by clearing out the left side  of the disk space, it was alternately placed on the opposite side to allow  for some distraction of the disk space ultimately to get to the posterior  longitudinal ligament region.  It was noted that the patient had large  spondylitic overgrowths from the inferior margin of body of C3 and also the  uncinate processes laterally.  These were drilled down with a 2.3 mm  dissection tool.  Ultimately, the bone spurs were removed en bloc.  The  posterior longitudinal ligament was noted to be thickened and redundant.  This was removed in piecemeal fashion to expose the common dural tube.  Ventral osteophytes were also removed from the superior margin of the body  of C4 centrally.  Once these were decompressed, the lateral edges were  decompressed.  Care was taken to assure hemostasis in the epidural  compartment and then a 7 mm tricortical graft  was fashioned with the  cortical surface facing dorsally.  Once this was secured, the ventral  aspects of the graft were trimmed and 18 mm standard size Synthes plate was  affixed to the anterior aspect of the vertebral body of C3 and C4 locking 4  x 14 mm screws.  This was checked for position  radiographically.  When it was felt to be adequate, the platysma was closed  with 3-0 Vicryl in interrupted fashion.  3-0 Vicryl was used to close the  subcuticular skin.  The patient tolerated the procedure well and was  returned to the recovery room in stable condition.                                                  Stefani Dama, M.D.    Merla Riches  D:  05/07/2002  T:  05/07/2002  Job:  [id number]

## 2010-11-13 NOTE — H&P (Signed)
[name], [name] [name]                 ACCOUNT NO.:  [id number]   MEDICAL RECORD NO.:  [id number]          PATIENT TYPE:  INP   LOCATION:  X001                         [facility]ACILITY:  [facility]   PHYSICIAN:  Jamison Neighbor, M.D.  DATE OF BIRTH:  [birth year]   DATE OF ADMISSION:  08/28/2004  DATE OF DISCHARGE:                                HISTORY & PHYSICAL   ADMISSION DIAGNOSIS:  Benign prostatic hypertrophy with bladder outlet  obstruction.   SECONDARY DIAGNOSES:  1.  Hypertension.  2.  Esophageal reflux disease.  3.  Arthritis.  4.  Possible mass on chest x-Ronak.   HISTORY OF PRESENT ILLNESS:  This 65 year old male has been followed by me  in the office for several years with lots of problems.  One of these is  bladder outlet obstruction.  The patient had a TUNA procedure done a number  of years ago and did well at first but now has a return of his symptoms.  He  has undergone cystoscopic examination and was found to have a tight bladder  neck with regrowth of the prostatic tissue and significant trabeculation.  The patient had an elevated post-residual and diminished flow rate.  Because  of his problems with dribbling, poor stream, urgency, frequency, incomplete  emptying, and general poor quality voiding, as well as his lack of response  to medications, he is now to undergo a TURP.  He will be admitted following  that procedure.   The patient also had erectile dysfunction.  He has not responded to oral  therapy.  Even with testosterone supplementation, his erections are very  poor.  The patient is going to consider implant therapy or injection therapy  but wants to wait until after the TURP has been completed.  The patient is  known to have some hypogonadism, but he has been AndroGel and his  testosterone and PSA have been carefully monitored.   PAST MEDICAL HISTORY:  1.  Esophageal reflux disease.  2.  History of hypertension.  3.  As part of his preoperative evaluation, we did  find an abnormality on      chest x-Damario, and he will getting a CT scan done while he is here in the      hospital.  4.  Thyroid disease.  5.  Depression.  6.  Breathing problems.   PAST SURGICAL HISTORY:  1.  Back fusion in January of 2005.  2.  Disk surgery in the neck in 2003.  3.  TUNA procedure in 2000.   MEDICATIONS:  1.  Stool softeners.  2.  Nexium.  3.  Synthroid.  4.  Lisinopril.  5.  AndroGel.  6.  He has been on anti-inflammatories and aspirin which have been      discontinued.  7.  He has also, in the fairly recent past, taken Lexapro.   ALLERGIES:  PENICILLIN, SULFA, AND CODEINE.   SOCIAL HISTORY:  The patient has not smoked since 1976.  He has not used  alcohol.   FAMILY HISTORY:  Pertinent for father who had a heart attack, hypertension,  diabetes,  kidney stones, and acid reflux.  He had a mother and sister both  with breast cancer.   REVIEW OF SYSTEMS:  Present in the office chart and is noncontributory at  this time.   PHYSICAL EXAMINATION:  GENERAL:  He is a well-developed, well-nourished man  in no acute distress.  VITAL SIGNS:  Temperature 97.8, pulse 84, respirations 18, blood pressure  134/70.  HEENT:  Normocephalic, atraumatic.  Cranial nerves II-XII are grossly  intact.  The patient does have an incision from his neck surgery.  LUNGS:  Clear.  HEART:  Regular rate and rhythm without murmurs, thrills, gallops, or rubs.  ABDOMEN:  Soft, nontender, with no palpable masses, rebound, or guarding.  EXTREMITIES:  No clubbing, cyanosis, or edema.  NEUROLOGIC:  Unremarkable.  VASCULAR:  Unremarkable.  SKIN:  Normal.  GU:  Normal penis.  The meatus was free of any abnormalities.  There were no  _________.  Testicles were normocephalic.  No __________ hernia, or  adenopathy.  RECTAL:  There is some regrowth of the prostate, generally smooth and  nonnodular.   IMPRESSION:  The patient has definite evidence of bladder outlet obstruction  and is now to  undergo cystoscopy and transurethral resection of the  prostate.      RJE/MEDQ  D:  08/28/2004  T:  08/28/2004  Job:  [id number]

## 2010-11-13 NOTE — Op Note (Signed)
NAME:  [name] [name], [name] [name]                           ACCOUNT NO.:  [id number]   MEDICAL RECORD NO.:  [id number]                   PATIENT TYPE:  INP   LOCATION:  3172                                 FACILITY:  [facility]   PHYSICIAN:  Stefani Dama, M.D.               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   PROCEDURES:  1. L5 Gill procedure.  2. Decompression of both L5 nerve roots.  3. Posterior lumbar interbody arthrodesis with leopard carbon fiber bone     cages.  4. Posterior nonsegmental fixation of L5-S1.  5. Posterolateral arthrodesis with local autograft and allograft platelet     gel.   SURGEON:  Stefani Dama, M.D.   FIRST ASSISTANT:  Cristi Loron, M.D.   ANESTHESIA:  General endotracheal.   INDICATIONS:  The patient is a 65 year old individual who has had previous  cervical spondylitic disease.  He has had increasing back and leg pain  bilaterally.  After careful consideration and evaluation of his problems, he  was noted to have a spondylolisthesis at the L5-S1 level and some lateral  recess entrapment of the L5 nerve roots.  Having failed efforts at  conservative management with the passage of time, he was advised regarding  surgical decompression and stabilization of his spine using a posterior  interbody technique.   DESCRIPTION OF PROCEDURE:  The patient was brought to the operating room and  placed on the table in the supine position.  After a smooth induction of  general endotracheal anesthesia, he was turned prone.  The back was shaved,  prepped with Duraprep, and draped in a sterile fashion.  Bony prominences  were appropriately padded and protected.  A midline  incision was created in  the lower lumbar spine and carried down to the lumbodorsal fascia, which was  opened on either side of the midline to expose the spinous processes of L4,  L5, and the sacrum.  L5 was noted to be particularly loose, but was  identified with radiography for positive identification.  Dissection was  then carried out in a subperiosteal fashion out to the facet joints and then  over the facet joints in the intertransverse spaces.  The laminar arch was  freed and then using rongeur to dissect the laminar fragment, it was removed  en bloc, allowing for good decompression of the bony cover over the common  dural tube and then the takeoff of the L5 nerve roots.  Then using  meticulous dissection of the takeoff of the L5 nerve roots.  This was  undertaken, removing significant overgrown material from the pseudoarthritic  capsule at L5-S1.  The facet surfaces were cleared on the superior facet of  S1 and laterally disk space was identified at the L5-S1 interspace.  After  dissecting the veins in the epidural space, the disk space was then entered  using a series of curets and rongeurs to remove the disk material from this  markedly degenerated space.  Once this was accomplished, interbody plugs  were inserted to distract the space and this was distracted to the 11 mm  height.  A template of the bone cage was then inserted on one side while the  other side was prepared.  A complete diskectomy was performed using a great  deal of caution and work to remove all of the disk from the subligamentous  space all the way to the anterior longitudinal ligament.  Once this was  secured, pedicle entry sites were then secured at L5-S1.  This was done with  radiographic confirmation of each of the pedicle probe placements.  Once  they were adequately placed, the probes were sequentially removed and  replaced with pedicle screws.  These were 6.5 x 45 mm pedicle screws.  The  lateral gutters  were then decorticated and this was in the intertransverse  space of L5 in the sacral ala.  This area was then packed with the patient's  autologous bone that was mixed with an allograft of bone matrix mixed with  platelet gel.  The interspace was similarly prepacked with bone and then 11  mm high leopard cages were placed into the interspace.  This was first done  on the right side and then on the left side.  The pedicle screws were then  reamed and the caps were placed over the pedicle screws at the L5 and S1  spaces.  These were provisionally locked down and then released to allow for  some compression of the construct.  Once the final compression was obtained,  the system was locked down totally.  The remainder of the bone was packed  into the lateral gutters over the hardware.  Then the wound was copiously  irrigated with antibiotic irrigating solution.  Final radiograph was  obtained.  Care was taken to make sure that the L5 and the S1 nerve roots  were well protected and were well decompressed.  Once this was secured, then  the lumbodorsal fascia was reapproximated with 1-0 Vicryl in an interrupted  fashion, 2-0 Vicryl was used in the subcutaneous tissues, and 3-0 Vicryl  subcuticularly.  The patient tolerated the procedure well.  The blood loss  was estimated at 500 mL and 200 mL of Cell Saver blood were returned to the  patient.                                               Stefani Dama, M.D.    Merla Riches  D:  07/02/2003  T:  07/02/2003  Job:  [id number]

## 2010-11-13 NOTE — Op Note (Signed)
[name] [name], [name] [name]                 ACCOUNT NO.:  [id number]   MEDICAL RECORD NO.:  [id number]          PATIENT TYPE:  INP   LOCATION:  0371                         FACILITY:  [facility]   PHYSICIAN:  Jamison Neighbor, M.D.  DATE OF BIRTH:  Jan 23, 1946   DATE OF PROCEDURE:  09/01/2004  DATE OF DISCHARGE:                                 OPERATIVE REPORT   PREOPERATIVE DIAGNOSIS:  Clot retention, status post TURP.   POSTOPERATIVE DIAGNOSIS:  Clot retention, status post TURP.   PROCEDURE:  1.  Clot evacuation.  2.  Fulguration of prostatic fossa.   SURGEON:  Jamison Neighbor, M.D.   ANESTHESIA:  General.   COMPLICATIONS:  None.   DRAINS:  A 24 French hematuria catheter with three-way bladder irrigation.   HISTORY:  This 65 year old male underwent a TURP on August 28, 2004.  The  patient's urine was quite clear on the first postoperative day.  The on-call  physician asked to consider removing the Foley catheter.  Due to the  patient's concern, the catheter was not removed until postoperative day #2  as per the usual postop routine.   The patient had a lot of straining to urinate as well as straining to move  his bowels, and he developed hematuria after the catheter was removed.  He  developed problems with emptying the bladder and had to have the catheter  removed.  Ever since the catheter was reinserted, the urine has been very  dark and has the appearance of old clot.  This has been difficult to clear,  and attempts at completely washing out the clot have been unsuccessful.  The  patient has had problems with his bowels, although those have straightened  out.  He has cut back some on his staining but because the urine is not  clear and despite two additional days of catheter drainage, it is felt that  he needs clot evacuation to remove the clot from the bladder in hopes to  allow him to urinate and leave the hospital.  The patient understands the  risks and benefits of the procedure  and gave full informed consent.   DESCRIPTION OF PROCEDURE:  After successful induction of general anesthesia,  the patient was placed in the dorsal lithotomy position, prepped with  Betadine, and draped in the usual sterile fashion.  Cystoscopy was  performed.  The urethra was visualized in its entirety and was found to be  normal.  Beyond the sphincter mechanism, the prostatic fossa was wide open.  The veru was intact.  There was no real bleeding from within the prostatic  fossa but up in the bladder proper, there were a lot of clots seen.  All the  clots were evacuated, and then inspection really showed very little in the  way of bleeding.  The cystoscope was removed.  The resectoscope was  inserted, and the entire area was carefully fulgurated removing old adherent  clot from the prostatic fossa and trying to fulgurate as much as possible.  There was very little if any tissue that required a resection and, in a  few  places, it was just done to smooth things out and allow the adherent clot to  be removed to check for underlying bleeders.  The irrigant ran quite clear,  and it was felt that there really was no active bleeding at this time.  A  three-way hematuria catheter was inserted.  Continuous bladder irrigation  was initiated.  This was placed to traction.  The patient  tolerated the procedure well and was taken to the recovery room in good  condition.  He will likely have a voiding trial tomorrow and be discharged  home as soon as he urinates.  Alternatively, he will be given the option to  go home with the catheter for a few days until stable.      RJE/MEDQ  D:  09/01/2004  T:  09/01/2004  Job:  [id number]

## 2010-11-13 NOTE — Discharge Summary (Signed)
[name] [name], [name] [name]                 ACCOUNT NO.:  [id number]   MEDICAL RECORD NO.:  [id number]          PATIENT TYPE:  INP   LOCATION:  0371                         [facility]ACILITY:  [facility]   PHYSICIAN:  Jamison Neighbor, M.D.  DATE OF BIRTH:  [birth year]   DATE OF ADMISSION:  08/28/2004  DATE OF DISCHARGE:  09/02/2004                                 DISCHARGE SUMMARY   DISCHARGE DIAGNOSES:  1.  Benign prostatic hypertrophy with bladder outlet obstruction.  2.  Postoperative bleeding.  3.  Hypertension.  4.  Esophageal reflux.  5.  Arthropathy.  6.  Erectile dysfunction.  7.  Hypogonadism.  8.  Constipation.  9.  History of tobacco use.  10. History of PENICILLIN allergy.   PRINCIPLE PROCEDURE:  Cystoscopy and transurethral resection of the  prostate.   SECONDARY PROCEDURE:  Cystoscopy and clot evacuation.   HISTORY:  This 65 year old male has been followed by me in the office for a  number of years.  The patient had a __________ procedure done a few years  ago and first did well but then began to have return of the symptoms.  The  patient was found to have a tight bladder neck with regrowth to the prostate  tissue and significant trabeculation.  Because of his significant postvoid  residual and diminished flow rate, it was recommended that he undergo a  TURP.   PAST MEDICAL HISTORY:  Remarkable for esophageal reflux disease,  hypertension, thyroid disease, depression, and some chronic breathing  problems.  Patient has also had past abnormalities on chest x-Kamran and had a  CT scan as part of his evaluation.  He does not appear to have a report in  the chart; however, my recollection of this is that it was negative when he  was actually in the hospital.   The patient's previous surgery and medications are well delineated in the  initial history and physical.   Patient was taken to the operating room, where he underwent a cystoscopy and  TURP.  The patient's initial postoperative  course was unremarkable.  He had  a little bit of nausea, but his urine cleared up very nicely.   By postoperative day #2, his urine was nearly clear, and Dr. __________  thought he would be ready for discharge.  The patient began to develop  severe pain after his cath was removed and said it was hard for him to  urinate.  For that reason, his discharge was held.  When the patient was  seen by me on March 6, his urine had turned grossly bloody and did not  clear.  We obtained a CBC and made the decision to take him for a clot  evacuation when his urine did not clear.  Fortunately, his laboratory  studies remained stable.  It does appear that the patient voided after the  Foley catheter was DC'd and could not clear the clot.  He was taken for clot  evacuation on March 7.   The patient's postoperative course was then quite unremarkable.  He was sent  home following clot evacuation  on September 02, 2004.  He was sent home with  Levaquin, Lorcet Plus, and will resume all of his preoperative medications  except for aspirin and anti-inflammatories.      RJE/MEDQ  D:  09/21/2004  T:  09/21/2004  Job:  [id number]

## 2010-11-13 NOTE — Discharge Summary (Signed)
NAME:  [name] [name], [name] [name]                           ACCOUNT NO.:  [id number]   MEDICAL RECORD NO.:  [id number]                   PATIENT TYPE:  INP   LOCATION:  3020                                 FACILITY:  [facility]   PHYSICIAN:  Stefani Dama, M.D.               DATE OF BIRTH:  11-13-1945   DATE OF ADMISSION:  07/02/2003  DATE OF DISCHARGE:  07/11/2003                                 DISCHARGE SUMMARY   ADMITTING DIAGNOSIS:  Spondylolisthesis, L5-S1 with lumbar radiculopathy in  L5 distribution.   DISCHARGE DIAGNOSES:  1. Spondylolisthesis, L5-S1 with lumbar radiculopathy in L5 distribution.  2. Paralytic ileus postoperatively with nausea and vomiting.   CONDITION ON DISCHARGE:  Improving.   HOSPITAL COURSE:  Mr. [name] is a 65 year old individual who has had  significant problems with back pain and lower extremity pain. He has a  spondylolisthesis at L5-S1.  He was taken to the operating room on  07/02/2003 where he underwent decompression stabilization. Postoperatively  the patient was slow to mobilize.  He is rather large in size.  He had some  fever in the early postoperative period of time and on the third  postoperative day was noted to develop increasing abdominal distention and  nausea with an ileus. He was given laxatives and kept NPO for a period of  time and given IV fluids.  This did not seem to make the situation better;  and, ultimately on the 8th he required the placement of an NG tube as he had  some episodes of nausea and vomiting.  The NG tube evacuated over 2 liters  of greenish-bilious fluid over time and this allowed for a good  decompression of his upper GI system.  The lower GI tract appeared to have  trapped gas and this was treated with a rectal tube which drained very  little.  Nonetheless he was observed in the hospital and finally on the 10th  it appeared that his abdomen was somewhat softer; but his ileus was somewhat  resolved, in that he was not  passing gas.  Rectal tube was removed.  The  patient was urged to become more mobile; and, over time, it seems that his  NG secretions decreased and he started to feel better; and his NG tube was  then discontinued on the 11th.  During the 12th he was able to tolerate some  p.o. intake and he was gradually urged to mobilize.  On the 13th his bowels  finally had moved.  He was feeling somewhat better, though he was  complaining of some back pain in the incision.  The incision was slightly  reddened and though it was not consistent with an infection the patient was  started on Cipro to prevent any infectious etiology from becoming manifest.   DISPOSITION:  The patient is discharged home.   DISCHARGE MEDICATIONS:  1. Cipro 500 mg twice a day  for 10 days.  2. Percocet, as needed for pain.  3. Valium as needed for muscle spasm.   DISCHARGE INSTRUCTIONS:  He will be seen in the office on January 21.   It should be noted that after the third hospital day when he had a fever to  101, his fevers gradually resolved though he did have some low-grade  temperature elevations to 100.4 maximally on the day prior to discharge.                                                Stefani Dama, M.D.    Merla Riches  D:  07/11/2003  T:  07/12/2003  Job:  [id number]

## 2010-11-13 NOTE — Op Note (Signed)
[name], [name]                 ACCOUNT NO.:  [id number]   MEDICAL RECORD NO.:  [id number]          PATIENT TYPE:  OIB   LOCATION:  3172                         FACILITY:  [facility]   PHYSICIAN:  Stefani Dama, M.D.  DATE OF BIRTH:  1945-10-22   DATE OF PROCEDURE:  07/05/2006  DATE OF DISCHARGE:                               OPERATIVE REPORT   PREOPERATIVE DIAGNOSIS:  Herniated nucleus pulposus, L3-L4 left with  left lumbar radiculopathy.   POSTOPERATIVE DIAGNOSIS:  Herniated nucleus pulposus, L3-L4 left with  left lumbar radiculopathy.   OPERATION:  L3-L4 extraforaminal diskectomy with operating microscope,  microdissection technique.   SURGEON:  Stefani Dama, M.D.   FIRST ASSISTANT:  Dr. Maeola Harman.   ANESTHESIA:  General endotracheal.   INDICATIONS:  [name] [name] is a 65 year old individual who has had  significant problems with back and left lower extremity pain and  weakness in the quadriceps and iliopsoas muscle.  He was found to have  foraminal stenosis on the left side at the L3-L4 level due to a  combination of spondylitic changes plus herniated fragment of disk right  in the region of the foramen.  The patient was advised that he may need  extraforaminal and intraforaminal decompression.  He is now taken to the  operating room.   PROCEDURE:  The patient brought to the operating room supine on the  stretcher.  After smooth induction of general endotracheal anesthesia he  was turned prone, back was prepped with DuraPrep and draped sterilely.  Radiographic localization with a needle marker was used to identify the  L3 laminar space and incision was made above this area and dissection  was then carried down in subperiosteal fashion to expose the laminar  arch of L3 out to the region of the pars.  Second localizing radiograph  confirmed position of the dissected area.  Then with a McCullough  retractor in place, the operating microscope was draped and  brought into  the field.  The external portion of the pars was drilled and the soft  tissue fascial attachments were removed.  The dissection was taken down  to the intertransverse ligament which was opened.  The fascia overlying  the external portion of the pars was then opened and underneath this the  L3 nerve root was identified. Inferior to the L3 nerve root, further  dissection relieved some fascial tissue and in this region, there was  noted to be some epidural veins, slight amount of fat.  The fat was  being displaced laterally and underneath this was identified a fragment  of disk in the region of the foramen.  The fragment was teased out.  It  was noted to have protruded from the under surface of the annular  ligament at the L3-4 disk space.  This opening was small.  Further  palpation revealed no other fragments of disk.  The foramen itself could  be palpated easily and inside the foramen, there was noted to be  significantly more fat.  The L3 nerve root itself appeared to be well  decompressed.  There was  some spondylitic overgrowth on the lateral  aspect that did appear to elevate and compress the path of the L3 nerve  root extraforaminally. This was taken down with an osteophyte tool and  allowed for some the decompression of lateral portion of the nerve right  in the region of the dorsal root ganglion.  In the and the L3 nerve root  was well decompressed in its path to the extraforaminal space.  Because  the extraforaminal decompression was felt to be quite adequate and  palpating within the foramen yielded no significant spondylitic  material, save for epidural fat then the intraforaminal exploration was  not performed.  The area was copiously irrigated with antibiotic  irrigating solution.  The retractor was removed, microscope was removed,  lumbodorsal fascia was then reapproximated with #1 Vicryl interrupted  fashion, 2-0 Vicryl using subcutaneous tissues, 3-0 Vicryl   subcuticularly and dry sterile dressing was placed on the skin.  The  patient tolerated procedure well was returned to recovery in stable  condition.      Stefani Dama, M.D.  Electronically Signed     HJE/MEDQ  D:  07/05/2006  T:  07/05/2006  Job:  [id number]

## 2010-11-13 NOTE — Discharge Summary (Signed)
NAME:  [name] [name], [name] [name]                           ACCOUNT NO.:  [id number]   MEDICAL RECORD NO.:  [id number]                   PATIENT TYPE:  INP   LOCATION:  3010                                 FACILITY:  [facility]   PHYSICIAN:  Stefani Dama, M.D.               DATE OF BIRTH:  [birth year]   DATE OF ADMISSION:  05/07/2002  DATE OF DISCHARGE:  05/10/2002                                 DISCHARGE SUMMARY   ADMISSION DIAGNOSIS:  Cervical spondylosis with myelopathy, C3-C4.   DISCHARGE DIAGNOSES:  1. Cervical spondylosis with myelopathy, C3-C4.  2. Postoperative urinary retention.  3. Postoperative nausea and vomiting secondary to ileus.  4. Spondylolisthesis, L5-S1 with lumbar radiculopathy.   CONDITION ON DISCHARGE:  Improving.   HOSPITAL COURSE:  Mr. Minish is a 65 year old individual who was having  significant left shoulder and arm pain. He was seen in the office with a MRI  that showed that he had spinal cord compression at the level of C3-C4. He  was advised regarding surgical decompression with stabilization of this  process and anterior diskectomy. This was carried out on 11/10.  Postoperatively, the patient had significant difficulties with at first  urinary retention and then a paralytic ileus which resulted in significant  nausea and vomiting. Medications were changed around in the first 24 hours,  and this did little to help; ultimately discontinuing his Percocet seemed to  help resolve the situation. The patient complained bitterly of significant  back pain and leg pain at the start of his admission. Because of the  persistence of this process, it was initially felt that his pain was due to  the fact he had to stop his Lodine prior to the surgery, but after given him  significant analgesic and anti-inflammatory medication, the pain persisted.  A MRI of the lumbar spine was performed on 11/12. This demonstrated the  patient had a grade I spondylolisthesis with some lateral  entrapment of the  L5 nerve root. It is felt at this time that he should be treated  conservatively for this process. His incision has been clean and dry,  although he has had some residual swelling developed in the left side of the  neck. It is not felt that this significant enough to require a surgical  drainage, and because he has remained stable, he was discharged home with  Darvocet N-100 #60 without refills, Valium 5 mg #40 without refills. He will  be seen in the office in two to three weeks' time for further followup. We  will see how he does with his low back pain and lumbar radiculopathy. He is  notably interested in the prospects for surgical intervention for this  process, and I suggested at this time that this should be treated  conservatively as he is neurologically intact with good dorsiflexor strength  in both lower extremities, though he does have primarily radicular  complaints  in the left leg. Condition on discharge is improving. Final  diagnoses are as noted above.                                               Stefani Dama, M.D.    Merla Riches  D:  05/10/2002  T:  05/10/2002  Job:  [id number]

## 2010-11-13 NOTE — Op Note (Signed)
NAMESHERROD, [name] [name]                 ACCOUNT NO.:  [id number]   MEDICAL RECORD NO.:  [id number]          PATIENT TYPE:  INP   LOCATION:  X001                         FACILITY:  [facility]   PHYSICIAN:  Jamison Neighbor, M.D.  DATE OF BIRTH:  [birth year]   DATE OF PROCEDURE:  08/28/2004  DATE OF DISCHARGE:                                 OPERATIVE REPORT   PREOPERATIVE DIAGNOSIS:  Benign prostatic hypertrophy with bladder outlet  obstruction.   POSTOPERATIVE DIAGNOSIS:  Benign prostatic hypertrophy with bladder outlet  obstruction.   PROCEDURE:  TURP.   SURGEON:  Jamison Neighbor, M.D.   ANESTHESIA:  General.   COMPLICATIONS:  None.   DRAINS:  69 French three-way Foley catheter.   INDICATIONS FOR PROCEDURE:  This 65 year old male has had longstanding  problems with bladder outlet obstruction.  A number of years ago, he  underwent a TUNA which was reasonably successful at first.  The patient did  not hold on to his good results for very long, however, and now has problems  with dribbling, poor stream, urgency, frequency, incomplete emptying, and a  sense that he is not voiding as well as he might like.  Cystoscopy and flow  rate study plus PVR check showed that he did have evidence of regrowth in  the prostate and a very tight urethra at the level of the bladder neck.  The  prostate had lateral hypertrophy as well as significant median lobe  hypertrophy.  The bladder was trabeculated.  The patient held 400 cc.  His  flow rate showed that he only voided 144 cc with an average __________ of  only 1, and it was a very sawtoothed pattern showing significant straining  and pushing.  The patient had a posterior residual of 175.  Because the  patient has not responded well to medication, he is now to undergo TURP.  He  understands the risks and benefits of the procedure and gave full informed  consent.   DESCRIPTION OF PROCEDURE:  After successful induction of general anesthesia,  the  patient was placed in the dorsal lithotomy position, prepped with  Betadine, and draped in the usual sterile fashion.   The urethra was dilated to 30 [place] with [employer].  The continuous  flow resectoscope sheath was then inserted using a 10 Blake obturator.  The  bladder neck was very tight and somewhat firm and certainly was obstructing.  The patient also had some lateral lobe hypertrophy.  The bladder was noted  to be trabeculated as seen in the studies done preoperatively.  The ureteral  orifices were identified.  The bladder neck was carefully taken down and  flattened as was the median lobe.  The right lateral lobe was then resected  beginning at the 11 o'clock position and extending down to the floor of the  prostate.  The left lateral lobe was resected in identical fashion.  There  was a small amount of tissue at the apex that was resected, with the  verumontanum and sphincter mechanism intact.  Resection was taken down to  the surgical  capsule all the way around and including the top where a small  amount of anterior lobe tissue was identified.  All chips were irrigated  from the bladder.  Careful inspection showed the bladder was wide open with  little fatty residual prostatic tissue.  Rectal examination confirmed that  there was a good resection.  The veru was intact.  The sphincter was intact.  The bladder neck had not been undermined.  The ureters had not been injured.  One final inspection showed that all chips were removed.  Adequate  hemostasis was  obtained with electrocautery.  The resectoscope was removed.  A 24 French  three-way Foley catheter was inserted and placed to straight drainage.  The  patient tolerated this procedure well and was taken to the recovery room in  good condition, with continuous bladder irrigation running.      RJE/MEDQ  D:  08/28/2004  T:  08/28/2004  Job:  [id number]   cc:   Duncan Dull, M.D.  [address]   [place]  [place] [address]  Fax: [phone]

## 2011-01-13 ENCOUNTER — Other Ambulatory Visit: Payer: Self-pay | Admitting: Oncology

## 2011-01-13 ENCOUNTER — Encounter (HOSPITAL_BASED_OUTPATIENT_CLINIC_OR_DEPARTMENT_OTHER): Payer: 59 | Admitting: Oncology

## 2011-01-13 DIAGNOSIS — D751 Secondary polycythemia: Secondary | ICD-10-CM

## 2011-01-13 LAB — CBC WITH DIFFERENTIAL/PLATELET
Basophils Absolute: 0 10*3/uL (ref 0.0–0.1)
EOS%: 1.5 % (ref 0.0–7.0)
HCT: 51.2 % — ABNORMAL HIGH (ref 38.4–49.9)
HGB: 17.4 g/dL — ABNORMAL HIGH (ref 13.0–17.1)
MCH: 31.9 pg (ref 27.2–33.4)
MCV: 93.6 fL (ref 79.3–98.0)
MONO%: 10.2 % (ref 0.0–14.0)
NEUT%: 69.4 % (ref 39.0–75.0)
Platelets: 178 10*3/uL (ref 140–400)
lymph#: 1.2 10*3/uL (ref 0.9–3.3)

## 2011-01-21 ENCOUNTER — Encounter: Payer: Self-pay | Admitting: Physician Assistant

## 2011-01-27 ENCOUNTER — Ambulatory Visit (INDEPENDENT_AMBULATORY_CARE_PROVIDER_SITE_OTHER): Payer: 59 | Admitting: Physician Assistant

## 2011-01-27 ENCOUNTER — Encounter: Payer: Self-pay | Admitting: Physician Assistant

## 2011-01-27 VITALS — BP 94/58 | HR 77 | Resp 18 | Ht 71.0 in | Wt 275.0 lb

## 2011-01-27 DIAGNOSIS — E785 Hyperlipidemia, unspecified: Secondary | ICD-10-CM

## 2011-01-27 DIAGNOSIS — I2510 Atherosclerotic heart disease of native coronary artery without angina pectoris: Secondary | ICD-10-CM

## 2011-01-27 DIAGNOSIS — I10 Essential (primary) hypertension: Secondary | ICD-10-CM

## 2011-01-27 DIAGNOSIS — Z01810 Encounter for preprocedural cardiovascular examination: Secondary | ICD-10-CM

## 2011-01-27 HISTORY — PX: TOTAL KNEE ARTHROPLASTY: SHX125

## 2011-01-27 NOTE — Progress Notes (Signed)
History of Present Illness: Primary Cardiologist:  Dr. Dietrich Pates  [name] [name] is a 65 y.o. male who presents for surgical clearance.   He has a history of nonobstructive CAD by cardiac catheterization in 3/10.  At that time he had a mid LAD 30% and proximal RCA 30% with an EF of 55-60%.  Myoview prior to his cardiac catheterization 2/10 demonstrated inferior scar with mild to moderate peri-infarct ischemia.  Echocardiogram 3/03 demonstrated normal LV function.  His other medical history includes hypertension, hyperlipidemia, hypothyroidism, depression, testosterone deficiency, obstructive sleep apnea and degenerative joint disease.  He needs a left total knee replacement.  The date is to be determined.  His surgeon will be Dr. Madelon Lips.  He denies any chest pain.  He has chronic dyspnea with exertion.  This is unchanged.  He is able to walk up hills, go up steps, vacuum a room and do all the cooking for his house without chest pain or shortness of breath.  He denies syncope.  He denies orthopnea.  He denies PND.  He denies significant edema.  His lisinopril/HCTZ was changed recently to what sounds like an ARB/HCTZ combination.  His cough has improved significantly with this.  Unfortunately, we do not have that on his medication list today.   Past Medical History  Diagnosis Date  . Observed sleep apnea     can't wear cpap  . HTN (hypertension)   . GERD (gastroesophageal reflux disease)   . Dyspnea     -PFTs compeltely normal 03/20/08 including DLc0  . Chest pain   . CAD (coronary artery disease)     cath 3/10: mLAD 30%, pRCA 30%, EF 55-60%    Current Outpatient Prescriptions  Medication Sig Dispense Refill  . aspirin (ASPIR-81) 81 MG EC tablet Take 81 mg by mouth 2 (two) times daily.        . citalopram (CELEXA) 20 MG tablet Take 20 mg by mouth daily.        [name] esomeprazole (NEXIUM) 40 MG capsule Take 40 mg by mouth daily before breakfast.        . HYDROcodone-acetaminophen (VICODIN) 5-500 MG  per tablet Take 1 tablet by mouth as needed.        [name] levothyroxine (SYNTHROID) 150 MCG tablet Take 150 mcg by mouth daily.        [name] lisinopril-hydrochlorothiazide (PRINZIDE,ZESTORETIC) 10-12.5 MG per tablet Take 1 tablet by mouth daily.        [name] oxymetazoline (AFRIN) 0.05 % nasal spray Place 2 sprays into the nose 2 (two) times daily.        . polyethylene glycol (MIRALAX) packet Take 17 g by mouth daily.        . simvastatin (ZOCOR) 20 MG tablet Take 20 mg by mouth at bedtime.        . solifenacin (VESICARE) 10 MG tablet Take 5 mg by mouth daily.        . Testosterone (ANDROGEL PUMP) 1.25 GM/ACT (1%) GEL Place onto the skin. 8 pumps daily         Allergies: Allergies  Allergen Reactions  . Codeine   . Penicillins   . Sulfonamide Derivatives     Social history:  Nonsmoker.  He quit 30 years ago.  ROS:  Please see the history of present illness.  All other systems reviewed and negative.   Vital Signs: BP 94/58  Pulse 77  Resp 18  Ht 5\' 11"  (1.803 m)  Wt 275 lb (124.739 kg)  BMI  38.35 kg/m2 Repeat blood pressure by me on the left: 108/70  PHYSICAL EXAM: Well nourished, well developed, in no acute distress HEENT: normal Neck: no JVD Vascular: No carotid bruits Cardiac:  normal S1, S2; RRR; no murmur Lungs:  clear to auscultation bilaterally, no wheezing, rhonchi or rales Abd: soft, nontender, no hepatomegaly Ext: Trace bilateral edema Skin: warm and dry Neuro:  CNs 2-12 intact, no focal abnormalities noted Psych: Normal affect  EKG:  Sinus rhythm, heart rate 77, left axis deviation, nonspecific ST-T wave changes  ASSESSMENT AND PLAN:

## 2011-01-27 NOTE — Patient Instructions (Signed)
Your physician wants you to follow-up in: December 2012 WITH DR. Tenny Craw. You will receive a reminder letter in the mail two months in advance. If you don't receive a letter, please call our office to schedule the follow-up appointment.

## 2011-01-27 NOTE — Assessment & Plan Note (Signed)
Controlled.  

## 2011-01-27 NOTE — Assessment & Plan Note (Signed)
Continue simvastatin. 

## 2011-01-27 NOTE — Assessment & Plan Note (Addendum)
He does not have any unstable cardiac conditions.  He had minimal nonobstructive coronary artery disease at cardiac catheterization in 3/10.  He is able to achieve 4 METS without symptoms of chest pain or dyspnea.  According to ACC/AHA guidelines, he requires no further cardiac workup prior to his noncardiac surgery.  He should be at acceptable risk.  Our service will be available in the perioperative period as necessary.

## 2011-01-27 NOTE — Assessment & Plan Note (Signed)
Continue aspirin and statin.  His blood pressure would not tolerate the addition of a beta blocker at this time.

## 2011-02-10 ENCOUNTER — Encounter (HOSPITAL_COMMUNITY)
Admission: RE | Admit: 2011-02-10 | Discharge: 2011-02-10 | Disposition: A | Discharge status: Home / Self Care | Payer: 59 | Source: Ambulatory Visit | Attending: Orthopedic Surgery | Admitting: Orthopedic Surgery

## 2011-02-10 ENCOUNTER — Other Ambulatory Visit (HOSPITAL_COMMUNITY): Payer: Self-pay | Admitting: Orthopedic Surgery

## 2011-02-10 ENCOUNTER — Ambulatory Visit (HOSPITAL_COMMUNITY)
Admission: RE | Admit: 2011-02-10 | Discharge: 2011-02-10 | Disposition: A | Discharge status: Home / Self Care | Payer: 59 | Source: Ambulatory Visit | Attending: Orthopedic Surgery | Admitting: Orthopedic Surgery

## 2011-02-10 DIAGNOSIS — J42 Unspecified chronic bronchitis: Secondary | ICD-10-CM | POA: Insufficient documentation

## 2011-02-10 DIAGNOSIS — Z01818 Encounter for other preprocedural examination: Secondary | ICD-10-CM | POA: Insufficient documentation

## 2011-02-10 DIAGNOSIS — I10 Essential (primary) hypertension: Secondary | ICD-10-CM | POA: Insufficient documentation

## 2011-02-10 DIAGNOSIS — G4730 Sleep apnea, unspecified: Secondary | ICD-10-CM | POA: Insufficient documentation

## 2011-02-10 DIAGNOSIS — M1712 Unilateral primary osteoarthritis, left knee: Secondary | ICD-10-CM

## 2011-02-10 DIAGNOSIS — Z01812 Encounter for preprocedural laboratory examination: Secondary | ICD-10-CM | POA: Insufficient documentation

## 2011-02-10 LAB — URINALYSIS, ROUTINE W REFLEX MICROSCOPIC
Glucose, UA: NEGATIVE mg/dL
Leukocytes, UA: NEGATIVE
Protein, ur: NEGATIVE mg/dL
Specific Gravity, Urine: 1.016 (ref 1.005–1.030)
pH: 6 (ref 5.0–8.0)

## 2011-02-10 LAB — COMPREHENSIVE METABOLIC PANEL
ALT: 27 U/L (ref 0–53)
Calcium: 9.7 mg/dL (ref 8.4–10.5)
GFR calc Af Amer: >60 mL/min (ref >60)
Glucose, Bld: 102 mg/dL — ABNORMAL HIGH (ref 70–99)
Sodium: 137 mEq/L (ref 135–145)
Total Protein: 8.2 g/dL (ref 6.0–8.3)

## 2011-02-10 LAB — DIFFERENTIAL
Basophils Absolute: 0 10*3/uL (ref 0.0–0.1)
Basophils Relative: 0 % (ref 0–1)
Eosinophils Relative: 2 % (ref 0–5)
Lymphocytes Relative: 23 % (ref 12–46)
Monocytes Absolute: 0.6 10*3/uL (ref 0.1–1.0)

## 2011-02-10 LAB — PROTIME-INR: Prothrombin Time: 13.3 seconds (ref 11.6–15.2)

## 2011-02-10 LAB — CBC
Hemoglobin: 18.2 g/dL — ABNORMAL HIGH (ref 13.0–17.0)
RBC: 5.7 MIL/uL (ref 4.22–5.81)

## 2011-02-10 LAB — APTT: aPTT: 33 seconds (ref 24–37)

## 2011-02-10 LAB — SURGICAL PCR SCREEN: MRSA, PCR: NEGATIVE

## 2011-02-10 LAB — TYPE AND SCREEN

## 2011-02-11 LAB — URINE CULTURE

## 2011-02-19 ENCOUNTER — Inpatient Hospital Stay (HOSPITAL_COMMUNITY)
Admission: RE | Admit: 2011-02-19 | Discharge: 2011-02-24 | DRG: 470 | Disposition: A | Discharge status: Skilled Nursing Facility | Payer: 59 | Source: Ambulatory Visit | Attending: Orthopedic Surgery | Admitting: Orthopedic Surgery

## 2011-02-19 ENCOUNTER — Inpatient Hospital Stay (HOSPITAL_COMMUNITY): Payer: 59

## 2011-02-19 DIAGNOSIS — E785 Hyperlipidemia, unspecified: Secondary | ICD-10-CM | POA: Diagnosis present

## 2011-02-19 DIAGNOSIS — G4733 Obstructive sleep apnea (adult) (pediatric): Secondary | ICD-10-CM | POA: Diagnosis present

## 2011-02-19 DIAGNOSIS — E291 Testicular hypofunction: Secondary | ICD-10-CM | POA: Diagnosis present

## 2011-02-19 DIAGNOSIS — Z01812 Encounter for preprocedural laboratory examination: Secondary | ICD-10-CM

## 2011-02-19 DIAGNOSIS — K560 Paralytic ileus: Secondary | ICD-10-CM | POA: Diagnosis not present

## 2011-02-19 DIAGNOSIS — I10 Essential (primary) hypertension: Secondary | ICD-10-CM | POA: Diagnosis present

## 2011-02-19 DIAGNOSIS — Y831 Surgical operation with implant of artificial internal device as the cause of abnormal reaction of the patient, or of later complication, without mention of misadventure at the time of the procedure: Secondary | ICD-10-CM | POA: Diagnosis not present

## 2011-02-19 DIAGNOSIS — M1710 Unilateral primary osteoarthritis, unspecified knee: Principal | ICD-10-CM | POA: Diagnosis present

## 2011-02-19 DIAGNOSIS — K929 Disease of digestive system, unspecified: Secondary | ICD-10-CM | POA: Diagnosis not present

## 2011-02-19 DIAGNOSIS — Z87891 Personal history of nicotine dependence: Secondary | ICD-10-CM

## 2011-02-19 DIAGNOSIS — K219 Gastro-esophageal reflux disease without esophagitis: Secondary | ICD-10-CM | POA: Diagnosis present

## 2011-02-19 DIAGNOSIS — E039 Hypothyroidism, unspecified: Secondary | ICD-10-CM | POA: Diagnosis present

## 2011-02-19 DIAGNOSIS — Z6838 Body mass index (BMI) 38.0-38.9, adult: Secondary | ICD-10-CM

## 2011-02-19 DIAGNOSIS — N400 Enlarged prostate without lower urinary tract symptoms: Secondary | ICD-10-CM | POA: Diagnosis present

## 2011-02-19 DIAGNOSIS — Z01818 Encounter for other preprocedural examination: Secondary | ICD-10-CM

## 2011-02-19 DIAGNOSIS — I2510 Atherosclerotic heart disease of native coronary artery without angina pectoris: Secondary | ICD-10-CM | POA: Diagnosis present

## 2011-02-20 LAB — CBC
HCT: 43.2 % (ref 39.0–52.0)
Hemoglobin: 14.4 g/dL (ref 13.0–17.0)
MCHC: 33.3 g/dL (ref 30.0–36.0)
RBC: 4.61 MIL/uL (ref 4.22–5.81)
WBC: 10.2 10*3/uL (ref 4.0–10.5)

## 2011-02-20 LAB — BASIC METABOLIC PANEL
BUN: 16 mg/dL (ref 6–23)
CO2: 30 mEq/L (ref 19–32)
Chloride: 99 mEq/L (ref 96–112)
Glucose, Bld: 129 mg/dL — ABNORMAL HIGH (ref 70–99)
Potassium: 4 mEq/L (ref 3.5–5.1)

## 2011-02-21 LAB — CBC
HCT: 39.4 % (ref 39.0–52.0)
Hemoglobin: 13.3 g/dL (ref 13.0–17.0)
MCH: 31.5 pg (ref 26.0–34.0)
MCHC: 33.8 g/dL (ref 30.0–36.0)
MCV: 93.4 fL (ref 78.0–100.0)

## 2011-02-21 LAB — BASIC METABOLIC PANEL
BUN: 16 mg/dL (ref 6–23)
CO2: 33 mEq/L — ABNORMAL HIGH (ref 19–32)
Calcium: 8.5 mg/dL (ref 8.4–10.5)
GFR calc non Af Amer: >60 mL/min (ref >60)
Glucose, Bld: 123 mg/dL — ABNORMAL HIGH (ref 70–99)

## 2011-02-22 ENCOUNTER — Inpatient Hospital Stay (HOSPITAL_COMMUNITY): Payer: 59

## 2011-02-22 DIAGNOSIS — R933 Abnormal findings on diagnostic imaging of other parts of digestive tract: Secondary | ICD-10-CM

## 2011-02-22 DIAGNOSIS — K560 Paralytic ileus: Secondary | ICD-10-CM

## 2011-02-22 LAB — URINE MICROSCOPIC-ADD ON

## 2011-02-22 LAB — URINALYSIS, ROUTINE W REFLEX MICROSCOPIC
Glucose, UA: NEGATIVE mg/dL
Ketones, ur: NEGATIVE mg/dL
Protein, ur: 100 mg/dL — AB
Urobilinogen, UA: 1 mg/dL (ref 0.0–1.0)

## 2011-02-22 LAB — CBC
MCH: 31.1 pg (ref 26.0–34.0)
MCHC: 33.3 g/dL (ref 30.0–36.0)
MCV: 93.3 fL (ref 78.0–100.0)
Platelets: 192 10*3/uL (ref 150–400)

## 2011-02-22 LAB — BASIC METABOLIC PANEL
Calcium: 8.7 mg/dL (ref 8.4–10.5)
Creatinine, Ser: 0.83 mg/dL (ref 0.50–1.35)
GFR calc non Af Amer: >60 mL/min (ref >60)
Glucose, Bld: 104 mg/dL — ABNORMAL HIGH (ref 70–99)
Sodium: 131 mEq/L — ABNORMAL LOW (ref 135–145)

## 2011-02-23 ENCOUNTER — Inpatient Hospital Stay (HOSPITAL_COMMUNITY): Payer: 59

## 2011-02-23 DIAGNOSIS — R933 Abnormal findings on diagnostic imaging of other parts of digestive tract: Secondary | ICD-10-CM

## 2011-02-23 DIAGNOSIS — K560 Paralytic ileus: Secondary | ICD-10-CM

## 2011-02-23 LAB — URINALYSIS, ROUTINE W REFLEX MICROSCOPIC
Nitrite: NEGATIVE
Protein, ur: 30 mg/dL — AB
Specific Gravity, Urine: 1.006 (ref 1.005–1.030)
Urobilinogen, UA: 1 mg/dL (ref 0.0–1.0)

## 2011-02-23 LAB — BASIC METABOLIC PANEL
CO2: 28 mEq/L (ref 19–32)
Calcium: 8.3 mg/dL — ABNORMAL LOW (ref 8.4–10.5)
Chloride: 94 mEq/L — ABNORMAL LOW (ref 96–112)
Glucose, Bld: 112 mg/dL — ABNORMAL HIGH (ref 70–99)
Potassium: 3.7 mEq/L (ref 3.5–5.1)
Sodium: 130 mEq/L — ABNORMAL LOW (ref 135–145)

## 2011-02-23 LAB — URINE MICROSCOPIC-ADD ON

## 2011-02-23 LAB — MAGNESIUM: Magnesium: 2.5 mg/dL (ref 1.5–2.5)

## 2011-02-23 LAB — PHOSPHORUS: Phosphorus: 2.3 mg/dL (ref 2.3–4.6)

## 2011-02-23 LAB — TSH: TSH: 3.706 u[IU]/mL (ref 0.350–4.500)

## 2011-02-25 LAB — URINE CULTURE

## 2011-03-12 NOTE — Op Note (Signed)
NAME:  MICKEAL, DAWS NO.:  [id number]  MEDICAL RECORD NO.:  [id number]  LOCATION:  5033                         FACILITY:  [facility]  PHYSICIAN:  Dyke Brackett, M.D.    DATE OF BIRTH:  [birth year]  DATE OF PROCEDURE:  02/19/2011 DATE OF DISCHARGE:                              OPERATIVE REPORT   INDICATION:  A 65 year old with significant osteoarthritis left knee, felt to be amenable to hospitalization, replacement.  PREOPERATIVE DIAGNOSIS:  Osteoarthritis, left knee.  POSTOPERATIVE DIAGNOSIS:  Osteoarthritis, left knee.  OPERATION:  Left total knee replacement (Sigma cemented knee, size 4 tibia, femur, 10 mm bearing with 41-mm 3-peg all poly patella.  SURGEON:  Dyke Brackett, MD  General anesthetic with local supplementation.  ASSISTANT:  Laural Benes. Su Hilt, PA  DESCRIPTION OF PROCEDURE:  Sterile prep and drape, exsanguination of legs, inflation of 375 mmHg.  Straight skin incision made with medial parapatellar approach to the knee made, identified the joint. Significant synovectomy carried out, disease in the medial compartment was noted.  We cut an 11-mm with 5-degree distal valgus cut followed by cutting 10 mm up the mostly diseased medial lateral compartment, resected the PCL and excess menisci.  Once this was done, we measured the extension gap at 10 mm and then pared the femur.  We sized the femur to be a size 4 and then placed a rotational guide on there, set the rotational pins, then placed the 5-in-1 cutting block on the femur cutting the anterior and posterior femur, and chamfers in slight external rotation.  We then did a little bone trimming on the posterior aspect of the femur  with released of the capsule and then matched the extension gap at 10 mm.  Attention was next directed to the tibia.  We actually used an MBT tray due to the patient's high BMI of over approximately 38.  __________ in keeping with the MBT tray we had actually cut the  slope of the tibia to accommodate that of a -2, placed the trial tibia then went back, did the box cut for the femoral prosthesis placed in the trial femur.  Full extension was noted. Excellent stability of varus and valgus.  No instability through anterior or  posterior stability was noted.  The patella was cut, leaving about 17 mm for the 3-peg all poly trial.  Again trial was carried out, full extension good alignment.  No  varus instability and full extension and flexion at 120 degrees without difficulty.  Bony surfaces were irrigated.  Trials was removed.  We placed final prosthesis in, cemented to dough state, followed by femur and patella, leaving the bearing trial in place, cement was allowed to harden, excess cement was removed under direct vision with the trial bearing out. Tourniquet was released.  No excessive bleeding was noted in the posterior aspect of the knee, all bleeders were coagulated.  Final bearing was placed.  We placed a Hemovac drain in the superolateral gutter.  We closed the wound with interrupted Ethibond, 2-0 Vicryl, skin clips, Marcaine with epinephrine was placed in the capsule and taken to the recovery room in a stable condition.  Dyke Brackett, M.D.     WDC/MEDQ  D:  02/19/2011  T:  02/19/2011  Job:  [id number]  Electronically Signed by W. Vira Chaplin M.D. on 03/12/2011 02:45:47 PM

## 2011-04-02 NOTE — Discharge Summary (Signed)
NAMEARLAND, USERY NO.:  [id number]  MEDICAL RECORD NO.:  [id number]  LOCATION:  5033                         FACILITY:  [facility]  PHYSICIAN:  Dyke Brackett, M.D.    DATE OF BIRTH:  1945-09-14  DATE OF ADMISSION:  02/19/2011 DATE OF DISCHARGE:  02/24/2011                              DISCHARGE SUMMARY   DIAGNOSIS:  End-stage degenerative joint disease, left knee.  SECONDARY DIAGNOSES:  Postoperative ileus, postoperative urinary tract infection, hypertension, benign prostatic hypertrophy, coronary artery disease, hyperlipidemia, and hypothyroidism.  DISCHARGE SUMMARY:  The patient is 65 year old male with a many year history of left knee osteoarthritis has failed conservative care with nonsteroidal, anti-inflammatories, injections, physical therapy, rest, and has pain that now prevents ADLs, safe ambulation, sleep and requires assistance with transfer.  The patient wishes to proceed with a left total knee arthroplasty after discussing the risks versus benefits of the procedure.  Primary MD is Dr. Kevan Ny.  Cardiac MD is Dr. Tenny Craw. Clearances were obtained from both prior to surgery.  The patient is allergic to PENICILLIN which causes rash and SULFA which causes nausea. Also has difficulty with CODEINE causing nausea.  Previous surgical history includes rotator cuff repair, TURP, effusion of both his neck and his back, vertebral bodies, heart catheterization, cataract surgery. Only difficulties postoperatively have been postoperative ileus.  MEDICATION AT TIME OF ADMISSION:  AndroGel, VESIcare, baby aspirin, citalopram, simvastatin, Nexium, levothyroxine, hydrocodone, nasal spray, losartan, hydrochlorothiazide, and MiraLax as well as daily aspirin.  SOCIAL HISTORY:  The patient no longer smokes.  He does not use alcohol or have any other drug or alcohol problems.  He lives with his wife in a 2-storey residence.  FAMILY HISTORY:  Positive for coronary artery  disease, heart attack, hypertension, diabetes, and breast cancer.  REVIEW OF SYSTEMS:  The patient is positive for change of vision due to cataract surgery, decreased hearing, difficulty swallowing large __bites,enlarged________ tonsils, upper and lower dentures, shortness of breath with exertion, elevated hemoglobin which is followed by Dr. Clelia Croft, sleep apnea for which he uses a VPAP, dizziness, and frequent urination.  Preoperative labs including CBC, CMET, chest x-Othon, EKG, PT, and PTT were all within normal limits.  PHYSICAL EXAMINATION:  VITAL SIGNS:  The patient's temperature is 99, pulse 84, respirations 16, blood pressure 120/81.  He is 5 feet 11 inches, 265-pound male with BMI of 37. HEENT:  Pupils equal, round, and reactive to light and accommodation after cataract surgery.  Nose is clear.  Throat benign. NECK:  Supple.  Full range of motion. CHEST:  Clear to auscultation with some upper airway congestion. CARDIAC:  Regular rate and rhythm. ABDOMEN:  Soft, nontender.  Positive bowel sounds. CNS:  Decreased light touch in bilateral lower extremities in stocking distribution with decreased quad strength bilaterally which has been present since time of his back surgeries.  SKIN:  Multiple scars, bilateral lower extremities as well as postsurgical scars in multiple areas. MUSCULOSKELETAL:  Decreased range of motion, left knee 5-110 degrees, stable ligamentous, minimal varus deformity.  HOSPITAL COURSE:  On the day of admission, the patient was taken to operating room at [facility] where he underwent a left total  knee arthroplasty using a Sigma DePuy components, size 4 femur, size 4 tibia, 10-mm bearing with 41 mm 3 peg all poly patella, all components cemented.  The patient was placed on perioperative antibiotics, postoperative Lovenox prophylaxis along with mechanical VTE prevention including TED hose and pulse stockings.  CPM was begun in the PACU. Hemovac and Foley  drains were placed perioperatively.  Postoperative day #1, the patient complained of moderate pain, was tolerating the CPM well.  Hemovac was discontinued without difficulty.  Lab shows hemoglobin to be 14.4, WBC of 10.2.  He had not advanced to full diet and trying to advance him slowly due to his previous history of ileus. Postoperative day #2, the patient was making slow but steady progress in physical therapy.  There was some thought that he might have to go to a skilled nursing facility due to his preoperative deconditioning. Temperature was 99.2, hemoglobin 13.3, WBC is 11.2.  Other vitals stable.  Minimal drainage of the Hemovac site.  Otherwise wound was clean and dry, tolerating CPM, still had no bowel movement but did have positive flatus and positive bowel sounds.  Postoperative day #3, the patient continued to complain of difficulty having bowel movement, had positive nausea, no emesis but was feeling bloated, was still having positive flatus.  Temperature 100.1, pulse 88.  Other vitals stable. Hemoglobin 12.9, WBC 11.0.  Also complained of some difficulty with burning with urination and he had a sensation that his urine was darker than its usual color.  The wound was clean and dry, orthopedically was stable, made slow to steady progress in physical therapy.  Because of his continued complaints of constipation and previous history of ileus, a KUB was obtained which did show a adynamic ileus.  [facility] was consulted as they had treated him in the past and discharge planning was begun for a skilled bed due to a slow progress in physical therapy. UA was obtained to determine if he had a UTI which did turn out to be positive for multiple bacteria after cultures were sent.  He was started on Cipro.  Postoperative day #4, the patient had positive bowel movement.  He was advanced from his full liquid diet to gradually increase to regular diet.  Orthopedically he was stable.   No change in wound, still had significant weakness but at his baseline level. Postoperative day #5, the patient was without complaint other than moderate pain and weakness when he attempted to ambulate.  He had had several positive bowel movements and no longer felt bloated.  He completed his course of Reglan to promote his bowels and a bed was available at [facility].  Examination of his wound showed it to be clean and dry.  No sign of infection.  Neck showed full range of motion and he was felt to be stable and okayed for discharge to [facility] for continued physical therapy and care.  At the time of his discharge, he was medically stable and improving.  DIAGNOSES:  End-stage degenerative joint disease, left knee, status post multiple surgeries on his lower back resulting in lower extremity weakness, postoperative ileus, mild hyponatremia, hypertension, hypothyroidism, cardiac artery disease, sleep apnea, and postoperative urinary tract infection.  MEDICATIONS AT TIME OF DISCHARGE: 1. Tylenol 1-2 pills by mouth every 4 hours as needed for pain.  This     should be used in preference to narcotics as much as possible due     to his postoperative ileus. 2. Cepacol sore throat lozenges as needed. 3.  Chloraseptic spray as needed for throat irritation. 4. Cipro 500 mg by mouth twice daily for an additional 10 days. 5. Benadryl 25 mg mouth daily as needed at bedtime. 6. Colace 100 mg by mouth twice daily. 7. Lovenox 30 mg injection subcutaneously b.i.d. for a total of 9     additional days. 8. Flonase nasal spray daily as needed. 9. TED hose.  Please continue until followup with the physician. 10.Robaxin 500 mg 1 p.o. by mouth every 6 hours as needed. 11.Ocean nasal spray as needed daily. 12.AndroGel 1%, 8 pumps to chest every morning topically. 13.Cetirizine 10 mg 1 tablet by mouth daily. 14.Citalopram 20 mg by mouth daily. 15.Hydrochlorothiazide 25 mg by mouth daily. 16.Hydrocodone  with Tylenol 1-2 tablets by mouth as needed every 6-8     hours that should be given in a very limited amount in hopes of     decreasing the return of his postoperative ileus. 17.Levothroid 175 mcg by mouth daily. 18.Losartan 50 mg by mouth daily. 19.MiraLax 1 scoop by mouth b.i.d. 2 daily as needed. 20.Nexium 40 mg by mouth daily. 21.Simvastatin 20 mg by mouth daily. 22.VESIcare 10 mg by mouth daily.  ACTIVITY:  Weightbearing as tolerated, bilateral lower extremities.  He should use a knee immobilizer until he is safe to ambulate without it due to his quadriceps strength.  This may be several more days.  He should use a walker and have assistance while ambulation, CPM 0-75 degrees today, increase by 5-10 degrees daily until bending 90 degrees, continue until followup appointment.  Followup appointment with Dr. Madelon Lips in 9 days' time.  Please call [phone] to make appointment.  He should return or call sooner if he should have any drainage from wound, any temperature increase greater than 101, any pain that is not well controlled by oral pain medication.  DIET:  Regular.  DRESSING CHANGES:  He needs to keep the wound clean, dry, and covered. The patient may shower doing a seated shower with careful attention to dry dressing being replaced after the shower is completed.  He should continue with TED hose for VTE prophylaxis until returning to see Dr. Madelon Lips.     Laural Benes. Jannet Mantis   ______________________________ Dyke Brackett, M.D.    JBR/MEDQ  D:  02/24/2011  T:  02/24/2011  Job:  [id number]  Electronically Signed by Laurell Josephs. on 03/12/2011 09:50:25 PM Electronically Signed by Lacretia Nicks. Sunshine Mackowski M.D. on 04/02/2011 01:56:56 PM

## 2011-04-09 LAB — CBC
HCT: 28.9 — ABNORMAL LOW
HCT: 29.6 — ABNORMAL LOW
HCT: 31.3 — ABNORMAL LOW
Hemoglobin: 10.6 — ABNORMAL LOW
Hemoglobin: 11 — ABNORMAL LOW
Hemoglobin: 13.5
Hemoglobin: 9.9 — ABNORMAL LOW
MCHC: 33.1
MCHC: 33.8
MCHC: 33.8
MCV: 82.1
MCV: 83.3
MCV: 84.3
MCV: 85.3
Platelets: 249
RBC: 3.81 — ABNORMAL LOW
RBC: 3.91 — ABNORMAL LOW
RBC: 4.91
RDW: 16.5 — ABNORMAL HIGH
RDW: 17 — ABNORMAL HIGH
RDW: 17.2 — ABNORMAL HIGH
WBC: 14.6 — ABNORMAL HIGH
WBC: 7.5
WBC: 8.4

## 2011-04-09 LAB — BASIC METABOLIC PANEL
BUN: 6
BUN: 7
CO2: 25
CO2: 25
CO2: 28
Calcium: 8.2 — ABNORMAL LOW
Calcium: 8.2 — ABNORMAL LOW
Calcium: 8.3 — ABNORMAL LOW
Chloride: 102
Chloride: 102
Chloride: 103
Chloride: 104
Chloride: 105
Creatinine, Ser: 0.76
Creatinine, Ser: 0.87
Creatinine, Ser: 1.1
GFR calc Af Amer: >60
GFR calc Af Amer: >60
GFR calc Af Amer: >60
GFR calc non Af Amer: >60
GFR calc non Af Amer: >60
Glucose, Bld: 101 — ABNORMAL HIGH
Glucose, Bld: 119 — ABNORMAL HIGH
Glucose, Bld: 137 — ABNORMAL HIGH
Glucose, Bld: 97
Potassium: 3.7
Potassium: 3.8
Sodium: 134 — ABNORMAL LOW
Sodium: 135
Sodium: 137
Sodium: 137

## 2011-04-09 LAB — URINALYSIS, ROUTINE W REFLEX MICROSCOPIC
Glucose, UA: NEGATIVE
Ketones, ur: NEGATIVE
Nitrite: NEGATIVE
Specific Gravity, Urine: 1.02
pH: 6

## 2011-04-09 LAB — TYPE AND SCREEN: Antibody Screen: NEGATIVE

## 2011-04-09 LAB — CULTURE, BLOOD (ROUTINE X 2)
Culture: NO GROWTH
Culture: NO GROWTH

## 2011-04-09 LAB — ABO/RH: ABO/RH(D): A POS

## 2011-04-09 LAB — URINE CULTURE

## 2011-05-19 ENCOUNTER — Other Ambulatory Visit: Payer: Self-pay | Admitting: Internal Medicine

## 2011-05-26 ENCOUNTER — Encounter: Payer: Self-pay | Admitting: *Deleted

## 2011-06-04 ENCOUNTER — Ambulatory Visit (INDEPENDENT_AMBULATORY_CARE_PROVIDER_SITE_OTHER): Payer: 59 | Admitting: Internal Medicine

## 2011-06-04 ENCOUNTER — Encounter: Payer: Self-pay | Admitting: Internal Medicine

## 2011-06-04 VITALS — BP 130/78 | HR 77 | Ht 71.0 in | Wt 266.4 lb

## 2011-06-04 DIAGNOSIS — G4733 Obstructive sleep apnea (adult) (pediatric): Secondary | ICD-10-CM

## 2011-06-04 DIAGNOSIS — E785 Hyperlipidemia, unspecified: Secondary | ICD-10-CM

## 2011-06-04 DIAGNOSIS — I10 Essential (primary) hypertension: Secondary | ICD-10-CM

## 2011-06-04 DIAGNOSIS — I2510 Atherosclerotic heart disease of native coronary artery without angina pectoris: Secondary | ICD-10-CM

## 2011-06-04 NOTE — Progress Notes (Signed)
HPI Patient is a 65 year old with a history of nonobstructive CAD by cardiac catheterization in 3/10. At that time he had a mid LAD 30% and proximal RCA 30% with an EF of 55-60%. Myoview prior to his cardiac catheterization 2/10 demonstrated inferior scar with mild to moderate peri-infarct ischemia. Echocardiogram 3/03 demonstrated normal LV function. His other medical history includes hypertension, hyperlipidemia, hypothyroidism, depression, testosterone deficiency, obstructive sleep apnea and degenerative joint disease.  He was last seen in clinic in August by Wende Mott as a preop risk stratification.  He underwent knee replacement by D. Caffrey. Since surgery his biggest problems have been continued knee pains. Also back pain.  He has been seen by Dr. Madelon Lips  He has an appt with neurosurgery. He denies Cp.  Breathing is OK.    Allergies  Allergen Reactions  . Codeine   . Penicillins   . Sulfonamide Derivatives     Current Outpatient Prescriptions  Medication Sig Dispense Refill  . aspirin (ASPIR-81) 81 MG EC tablet Take 81 mg by mouth 2 (two) times daily.        . citalopram (CELEXA) 20 MG tablet Take 20 mg by mouth daily.        [name] esomeprazole (NEXIUM) 40 MG capsule Take 40 mg by mouth daily before breakfast.        . hydrochlorothiazide (HYDRODIURIL) 25 MG tablet Take 25 mg by mouth daily.        [name] HYDROcodone-acetaminophen (VICODIN) 5-500 MG per tablet Take 1 tablet by mouth as needed.        [name] levothyroxine (SYNTHROID) 150 MCG tablet Take 200 mcg by mouth daily.       [name] losartan-hydrochlorothiazide (HYZAAR) 50-12.5 MG per tablet Take 1 tablet by mouth daily.        . methocarbamol (ROBAXIN) 500 MG tablet Take 500 mg by mouth 2 (two) times daily.        [name] oxymetazoline (AFRIN) 0.05 % nasal spray Place 2 sprays into the nose 2 (two) times daily.        . polyethylene glycol (MIRALAX) packet Take 17 g by mouth daily.        . simvastatin (ZOCOR) 20 MG tablet TAKE 1 TABLET AT BEDTIME  90  tablet  2  . solifenacin (VESICARE) 10 MG tablet Take 5 mg by mouth daily.        . Testosterone (ANDROGEL PUMP) 1.25 GM/ACT (1%) GEL Place onto the skin. 8 pumps daily         Past Medical History  Diagnosis Date  . Observed sleep apnea     can't wear cpap  . HTN (hypertension)   . GERD (gastroesophageal reflux disease)   . Dyspnea     -PFTs compeltely normal 03/20/08 including DLc0  . Chest pain   . CAD (coronary artery disease)     cath 3/10: mLAD 30%, pRCA 30%, EF 55-60%    Past Surgical History  Procedure Date  . Neck and back fusions   . Prostate surgeries     x3  . Shoulder surgery     No family history on file.  History   Social History  . Marital Status: Married    Spouse Name: N/A    Number of Children: N/A  . Years of Education: N/A   Occupational History  . Not on file.   Social History Main Topics  . Smoking status: Former Games developer  . Smokeless tobacco: Not on file   Comment: smoked for  15 years 1-2 ppd. Quit 1980s   . Alcohol Use:   . Drug Use:   . Sexually Active:    Other Topics Concern  . Not on file   Social History Narrative  . No narrative on file    Review of Systems:  All systems reviewed.  They are negative to the above problem except as previously stated.  Vital Signs: BP 130/78  Pulse 77  Ht 5\' 11"  (1.803 m)  Wt 266 lb 6.4 oz (120.838 kg)  BMI 37.16 kg/m2  Physical Exam  Patient is in NAD HEENT:  Normocephalic, atraumatic. EOMI, PERRLA.  Neck: JVP is normal. No thyromegaly. No bruits.  Lungs: clear to auscultation. No rales no wheezes.  Heart: Regular rate and rhythm. Normal S1, S2. No S3.   No significant murmurs. PMI not displaced.  Abdomen:  Supple, nontender. Normal bowel sounds. No masses. No hepatomegaly.  Extremities:   Good distal pulses throughout.  L knee swollen. Musculoskeletal :moving all extremities.  Neuro:   alert and oriented x3.  CN II-XII grossly intact.  EKG:  SR.  77 bpm.  LAFB.  LVH   Assessment  and Plan:

## 2011-06-04 NOTE — Patient Instructions (Signed)
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

## 2011-06-06 NOTE — Assessment & Plan Note (Signed)
Adequate control. 

## 2011-06-06 NOTE — Assessment & Plan Note (Signed)
Need to get fasting lipids from [facility] office.

## 2011-06-06 NOTE — Assessment & Plan Note (Signed)
Intolerant to CPAP (tried all masks)

## 2011-06-06 NOTE — Assessment & Plan Note (Signed)
No symptoms to sugg ischemia.  Encouraged phisical acitivyt as Knee, back allow.

## 2011-06-08 ENCOUNTER — Other Ambulatory Visit: Payer: Self-pay | Admitting: Oncology

## 2011-06-08 ENCOUNTER — Telehealth: Payer: Self-pay | Admitting: Oncology

## 2011-06-08 ENCOUNTER — Other Ambulatory Visit (HOSPITAL_BASED_OUTPATIENT_CLINIC_OR_DEPARTMENT_OTHER): Payer: 59 | Admitting: Lab

## 2011-06-08 ENCOUNTER — Ambulatory Visit (HOSPITAL_BASED_OUTPATIENT_CLINIC_OR_DEPARTMENT_OTHER): Payer: 59 | Admitting: Oncology

## 2011-06-08 VITALS — BP 120/78 | HR 76 | Temp 97.4°F | Ht 71.0 in | Wt 266.4 lb

## 2011-06-08 DIAGNOSIS — D751 Secondary polycythemia: Secondary | ICD-10-CM

## 2011-06-08 LAB — CBC & DIFF AND RETIC
BASO%: 0.2 % (ref 0.0–2.0)
EOS%: 1.5 % (ref 0.0–7.0)
Immature Retic Fract: 10.4 % (ref 3.00–10.60)
MCH: 30.9 pg (ref 27.2–33.4)
MCHC: 33.5 g/dL (ref 32.0–36.0)
NEUT%: 64.9 % (ref 39.0–75.0)
RBC: 5.27 10*6/uL (ref 4.20–5.82)
RDW: 13.2 % (ref 11.0–14.6)
lymph#: 2 10*3/uL (ref 0.9–3.3)

## 2011-06-08 NOTE — Progress Notes (Signed)
Hematology and Oncology Follow Up Visit  [name] [name] [id number] [birth year] 65 y.o. 06/08/2011 10:33 AM  CC: Duncan Dull, M.D.    Principle Diagnosis:This is a 65 year old gentleman with polycythemia, initially seen in March 2012, etiology most likely reactive due to obstructive sleep apnea and androgen supplements.   Current therapy: Observation and Surveillance.   Interim History:  [name] [name] presents today for a followup visit.  He is a pleasant 65 year old gentleman whom I saw back in March 2012 for evaluation for polycythemia.  His hemoglobin had ranged between 16 and 17 and up as high as 18 in December 2011.  He had normal white cell count and normal platelets.  He has had a lot of reasons to possibly be causing his erythrocytosis.  Causes of possible polycythemia would include obstructive sleep apnea as well as androgen supplements.  He has had really prototypical obstructive sleep apnea symptoms that include apneic episodes, snoring, he has really a thick neck and thick tonsils.  He had not had any major complications related to that, had not had any thrombotic events.  Did not report any bleeding complications.  Overall, performance status has been limited due to osteoarthritis of the knees, planning a knee operation in the near future. He has his Knee operation without complications  Medications: I have reviewed the patient's current medications. Current outpatient prescriptions:aspirin (ASPIR-81) 81 MG EC tablet, Take 81 mg by mouth 2 (two) times daily.  , Disp: , Rfl: ;  citalopram (CELEXA) 20 MG tablet, Take 20 mg by mouth daily.  , Disp: , Rfl: ;  esomeprazole (NEXIUM) 40 MG capsule, Take 40 mg by mouth daily before breakfast.  , Disp: , Rfl: ;  hydrochlorothiazide (HYDRODIURIL) 25 MG tablet, Take 25 mg by mouth daily.  , Disp: , Rfl:  HYDROcodone-acetaminophen (VICODIN) 5-500 MG per tablet, Take 1 tablet by mouth as needed.  , Disp: , Rfl: ;  levothyroxine (SYNTHROID) 150 MCG  tablet, Take 200 mcg by mouth daily. , Disp: , Rfl: ;  losartan-hydrochlorothiazide (HYZAAR) 50-12.5 MG per tablet, Take 1 tablet by mouth daily.  , Disp: , Rfl: ;  methocarbamol (ROBAXIN) 500 MG tablet, Take 500 mg by mouth 2 (two) times daily.  , Disp: , Rfl:  oxymetazoline (AFRIN) 0.05 % nasal spray, Place 2 sprays into the nose 2 (two) times daily.  , Disp: , Rfl: ;  polyethylene glycol (MIRALAX) packet, Take 17 g by mouth daily.  , Disp: , Rfl: ;  simvastatin (ZOCOR) 20 MG tablet, TAKE 1 TABLET AT BEDTIME, Disp: 90 tablet, Rfl: 2;  solifenacin (VESICARE) 10 MG tablet, Take 5 mg by mouth daily.  , Disp: , Rfl:  Testosterone (ANDROGEL PUMP) 1.25 GM/ACT (1%) GEL, Place onto the skin. 8 pumps daily , Disp: , Rfl:   Allergies:  Allergies  Allergen Reactions  . Codeine   . Penicillins   . Sulfonamide Derivatives     Past Medical History, Surgical history, Social history, and Family History were reviewed and updated.  Review of Systems: Constitutional:  Negative for fever, chills, night sweats, anorexia, weight loss, pain. Cardiovascular: no chest pain or dyspnea on exertion Respiratory: no cough, shortness of breath, or wheezing Neurological: no TIA or stroke symptoms Dermatological: negative ENT: negative Skin: Negative. Gastrointestinal: no abdominal pain, change in bowel habits, or black or bloody stools Genito-Urinary: no dysuria, trouble voiding, or hematuria Hematological and Lymphatic: negative Breast: negative Musculoskeletal: negative Remaining ROS negative. Physical Exam: Blood pressure 120/78, pulse 76, temperature 97.4  F (36.3 C), temperature source Oral, height 5\' 11"  (1.803 m), weight 266 lb 6.4 oz (120.838 kg). ECOG: 1 General appearance: alert Head: Normocephalic, without obvious abnormality, atraumatic Neck: no adenopathy, no carotid bruit, no JVD, supple, symmetrical, trachea midline and thyroid not enlarged, symmetric, no tenderness/mass/nodules Lymph nodes:  Cervical, supraclavicular, and axillary nodes normal. Heart:regular rate and rhythm, S1, S2 normal, no murmur, click, rub or gallop Lung:chest clear, no wheezing, rales, normal symmetric air entry Abdomin: soft, non-tender, without masses or organomegaly EXT:no erythema, induration, or nodules   Lab Results: Lab Results  Component Value Date   WBC 8.1 06/08/2011   HGB 16.3 06/08/2011   HCT 48.7 06/08/2011   MCV 92.4 06/08/2011   PLT 230 06/08/2011     Chemistry      Component Value Date/Time   NA 130* 02/23/2011 0600   K 3.7 02/23/2011 0600   CL 94* 02/23/2011 0600   CO2 28 02/23/2011 0600   BUN 20 02/23/2011 0600   CREATININE 0.90 02/23/2011 0600      Component Value Date/Time   CALCIUM 8.3* 02/23/2011 0600   ALKPHOS 71 02/10/2011 0939   AST 25 02/10/2011 0939   ALT 27 02/10/2011 0939   BILITOT 0.5 02/10/2011 0939         Impression and Plan: This is a pleasant 65 year old gentleman with the following issues: 1. 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  I encouraged him to really get more definitive treatment for his sleep apnea, whether that would be surgical intervention, whether it is more adherence to his CPAP prescription.  At this point, his hemoglobin, although slightly elevated above the normal range, is really a lower risk of thrombosis than primary causes for polycythemia.  At this point, I see really no immediate need for phlebotomy, and really from a hematology standpoint, it would be reasonable to observe for the time being.  2. Thrombosis prophylaxis.  I think low-dose aspirin would be a reasonable choice. 3. Knee operation.  No complications at this time .    [facility], MD 12/11/201210:33 AM

## 2011-06-08 NOTE — Telephone Encounter (Signed)
D and t per pt,appt info given for 11/2011,aom

## 2011-06-24 ENCOUNTER — Encounter: Payer: Self-pay | Admitting: Internal Medicine

## 2011-09-15 ENCOUNTER — Encounter (HOSPITAL_COMMUNITY): Payer: Self-pay | Admitting: Pharmacy Technician

## 2011-09-16 ENCOUNTER — Other Ambulatory Visit (HOSPITAL_COMMUNITY): Payer: Self-pay | Admitting: *Deleted

## 2011-09-17 ENCOUNTER — Encounter (HOSPITAL_COMMUNITY): Payer: Self-pay

## 2011-09-17 ENCOUNTER — Encounter (HOSPITAL_COMMUNITY)
Admission: RE | Admit: 2011-09-17 | Discharge: 2011-09-17 | Disposition: A | Discharge status: Home / Self Care | Payer: Medicare Other | Source: Ambulatory Visit | Attending: Otolaryngology | Admitting: Otolaryngology

## 2011-09-17 HISTORY — DX: Unspecified osteoarthritis, unspecified site: M19.90

## 2011-09-17 HISTORY — DX: Secondary polycythemia: D75.1

## 2011-09-17 HISTORY — DX: Major depressive disorder, single episode, unspecified: F32.9

## 2011-09-17 HISTORY — DX: Other specified postprocedural states: Z98.890

## 2011-09-17 HISTORY — DX: Nausea with vomiting, unspecified: R11.2

## 2011-09-17 HISTORY — DX: Sleep apnea, unspecified: G47.30

## 2011-09-17 HISTORY — DX: Depression, unspecified: F32.A

## 2011-09-17 HISTORY — DX: Hypothyroidism, unspecified: E03.9

## 2011-09-17 LAB — BASIC METABOLIC PANEL
Calcium: 10.2 mg/dL (ref 8.4–10.5)
GFR calc non Af Amer: >90 mL/min (ref >90)
Sodium: 136 mEq/L (ref 135–145)

## 2011-09-17 LAB — DIFFERENTIAL
Basophils Absolute: 0 K/uL (ref 0.0–0.1)
Basophils Relative: 0 % (ref 0–1)
Eosinophils Absolute: 0.1 10*3/uL (ref 0.0–0.7)
Eosinophils Relative: 2 % (ref 0–5)
Lymphocytes Relative: 19 % (ref 12–46)
Lymphs Abs: 1.6 10*3/uL (ref 0.7–4.0)
Monocytes Absolute: 1 K/uL (ref 0.1–1.0)
Monocytes Relative: 12 % (ref 3–12)
Neutro Abs: 5.5 K/uL (ref 1.7–7.7)
Neutrophils Relative %: 67 % (ref 43–77)

## 2011-09-17 LAB — SURGICAL PCR SCREEN
MRSA, PCR: NEGATIVE
Staphylococcus aureus: NEGATIVE

## 2011-09-17 LAB — BASIC METABOLIC PANEL WITH GFR
BUN: 15 mg/dL (ref 6–23)
CO2: 29 meq/L (ref 19–32)
Chloride: 97 meq/L (ref 96–112)
Creatinine, Ser: 0.8 mg/dL (ref 0.50–1.35)
GFR calc Af Amer: >90 mL/min (ref >90)
Glucose, Bld: 71 mg/dL (ref 70–99)
Potassium: 4.7 meq/L (ref 3.5–5.1)

## 2011-09-17 LAB — CBC
HCT: 52.6 % — ABNORMAL HIGH (ref 39.0–52.0)
Hemoglobin: 18.3 g/dL — ABNORMAL HIGH (ref 13.0–17.0)
MCH: 31.9 pg (ref 26.0–34.0)
MCHC: 34.8 g/dL (ref 30.0–36.0)
MCV: 91.8 fL (ref 78.0–100.0)
Platelets: 247 10*3/uL (ref 150–400)
RBC: 5.73 MIL/uL (ref 4.22–5.81)
RDW: 13.6 % (ref 11.5–15.5)
WBC: 8.2 10*3/uL (ref 4.0–10.5)

## 2011-09-17 NOTE — Consult Note (Signed)
Anesthesia:  Patient is a 65 year old male scheduled for a UPPP, tonsillectomy, bilateral turbinate reduction, septal reconstruction on 09/21/11.  History includes post-operative N/V, former smoker, OSA, HTN, GERD, depression, hypothyroidism, polycythemia felt secondary to OSA and androgen supplements (sees Hematologist Dr. Firas Shadad), dyspnea with normal PFTs on 03/20/08, prior neck, back, and prostate surgeries, and history of chest pain with non-obstructive CAD by cath in March 2010.  He is S/P left TKR on 02/19/11.  His Cardiologist is Dr. Ross.  He last saw her on 06/06/11.  He was free of chest pain and breathing was at baseline at that appointment.  EKG at that time showed NSR, LAFB, LVH which was present on his prior EKG from August 2012.  According to his last Cardiology notes, he had an abnormal adenosine Myoview on 06/08/08 that demonstrated inferior scar with mild to moderate peri-infarct ischemia. He subsequently underwent cardiac cath on 08/26/08 that showed: 1. Mild nonobstructive coronary artery disease (30% mid LAD, 40% proximal RCA).  2. Normal left ventricular function. EF 55-60%.  CXR on 02/10/11 showed no active disease. Stable chronic mild bronchitic changes.   Labs noted.  As mentioned he has a history of polycythemia with Hbg ranging between 16-18 according to Hematology notes. He last saw Dr. Shadad on 06/08/11 (see Notes tab)  According to his assessment/plan at that time, he did not see any immediate need for phlebotomy but recommended definitive treatment for his OSA which he is now scheduled.  Anticipate he can proceed if no significant change in his status. 

## 2011-09-17 NOTE — Pre-Procedure Instructions (Addendum)
20 [name] [name]  09/17/2011   Your procedure is scheduled on: September 21, 2011  Report to [facility] at 0530 AM.  Call this number if you have problems the morning of surgery: [phone]   Remember:   Do not eat food:After Midnight.  May have clear liquids: up to 4 Hours before arrival.  Clear liquids include soda, tea, black coffee, apple or grape juice, broth.  Take these medicines the morning of surgery with A SIP OF WATER: Cymbalta, Nexium, Levothyroxine, Vesicare   Stop Aspirin today  Do not wear jewelry, make-up or nail polish.  Do not wear lotions, powders, or perfumes. You may wear deodorant.  Do not shave 48 hours prior to surgery.  Do not bring valuables to the hospital.  Contacts, dentures or bridgework may not be worn into surgery.  Leave suitcase in the car. After surgery it may be brought to your room.  For patients admitted to the hospital, checkout time is 11:00 AM the day of discharge.   Patients discharged the day of surgery will not be allowed to drive home.  Name and phone number of your driver: Will have drive DOS  Special Instructions: CHG Shower Use Special Wash: 1/2 bottle night before surgery and 1/2 bottle morning of surgery.   Please read over the following fact sheets that you were given: Pain Booklet, Coughing and Deep Breathing and Surgical Site Infection Prevention

## 2011-09-20 NOTE — H&P (Signed)
NAME:  [name] [name], ENDERLE                      ACCOUNT NO.:  MEDICAL RECORD NO.:  [id number]  LOCATION:                                 FACILITY:  PHYSICIAN:  Hermelinda Medicus, M.D.   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  I am recommending a septal reconstruction, turbinate reduction, tonsillectomy, and uvulopalatoplasty because he has also severe sleep apnea.  He has been on CPAP, which has essentially failed because they have had to have such high 20 cm of water pressure.  He is very tired of the CPAP and cannot get it to work as well as he had hoped.  He had a polysomnogram, which showed the sleep time of 291 minutes, 0% time in stage III, 25 minutes in stage REM.  His deep sleep should be at least 1/3 of his time and he is spending about 1/10 of this time in deep sleep.  His RDI is 37.3, his O2 nadir was 82%.  His slowest heart rate was 53.  The CPAP being set so strongly, it literally blows his mask and he states off his face and with this severe septal deviation that is one of the problems, where he cannot get the air in through his nose.  He wishes to reconsider and talk about surgery and wishes to have a septal reconstruction, turbinate reduction, tonsillectomy, and uvulopalatoplasty or UPPP.  His past history is of hypothyroidism; depression, which may be somewhat secondary to sleep deprivation; gastroesophageal reflux, which is also aggravated by his sleep deprivation; snoring and sleep apnea; and chronic nasal obstruction.  MEDICATIONS: 1. MiraLAX suspension 1 scoop once a day. 2. Aspirin 81. 3. Nasal spray solution 2 sprays each nostril at bedtime. 4. VESIcare 10. 5. AndroGel pump 1% 8 pumps once a  day. 6. Nexium 40. 7. Synthroid 200 mcg 6 days a week. 8. Hydrochlorothiazide 25 mg tablet once a day. 9. Losartan potassium 50 mg once a day. 10.Simvastatin 20 once a day. 11.Citalopram hydrobromide 20 mg has been discontinued. 12.Nasonex nasal spray once a day. 13.Zyrtec for allergy 10 mg per day.  PAST MEDICAL HISTORY: 1. Hypertension. 2. Hyperthyroidism. 3. Gastroesophageal reflux. 4. Osteoarthritis. 5. Cervical spondylosis. 6. Obesity. 7. Sleep apnea. 8. Hypogonadism. 9. Coronary artery disease.  GENERAL HISTORY:  He has a history of smoking, but quit in 1973.  He does drink some caffeine, Pepsi, and coffee.  Does not exercise very much.  He has also had orthopedic surgery in the past.  PHYSICAL EXAMINATION:  HEENT:  Ears are completely clear.  Tympanic membranes look excellent.  He is doing extremely well.  His nose shows a severe septal deviation and turbinate hypertrophy as well.  His oral cavity is fairly small.  He has large tonsils.  His larynx is clear. True cords, false cords, epiglottis, base of tongue, lateral pharyngeal walls are clear.  No evidence  of any ulcerations, mass, or lesions. True cord mobility, gag reflex, tongue mobility, EOMs, facial nerve are all symmetrical as is shoulder strength. NECK:  His neck is fairly full, but no evidence of any cervical adenopathy, mass, or ulceration.  Posterior triangles also scalp and skin are clear of ulceration mass or lesion. CHEST:  Clear.  No rales, rhonchi, or wheezes.  He had a cardiac catheterization.  At that time, it shows a mid LAD 30%, proximal RCA 30%.  Ejection fraction 55-60%.  He had a mild scar and moderate peri-infarct and ischemia.  Echocardiogram demonstrated normal left ventricular function.  He does have a history of hypertension, hyperlipidemia, hypothyroidism, depression, testosterone deficiency, sleep apnea, and degenerative joint disease.  He has been seen recently by Dr. Dietrich Pates,  who has cleared him for surgery.  In her statements June 04, 2011, doing well from a cardiac standpoint.  Low risk of any major cardiac events for ENT surgery, proceed with further testing.  He therefore has a diagnosis of sleep apnea with septal deviation, turbinate hypertrophy, uvulopalatoplasty, and tonsillar hypertrophy. Uvulopalatoplasty necessary.  A clear larynx.  His further diagnoses; hypertension, hypothyroidism, gastroesophageal reflux, multiple joint osteoarthritis, cervical spondylosis, low back pain, obesity, sleep apnea, hypogonadism, history of smoking, history of chronic nasal congestion.  He also has an allergy.  He is allergic to PENICILLIN, SULFA, and CODEINE.          ______________________________ Hermelinda Medicus, M.D.     JC/MEDQ  D:  09/20/2011  T:  09/20/2011  Job:  [id number]  cc:   Pricilla Riffle, MD, [facility] Armanda Magic, M.D. Duncan Dull, M.D.

## 2011-09-21 ENCOUNTER — Encounter (HOSPITAL_COMMUNITY)
Admission: RE | Disposition: A | Discharge status: Home / Self Care | Payer: Self-pay | Source: Ambulatory Visit | Attending: Otolaryngology

## 2011-09-21 ENCOUNTER — Encounter (HOSPITAL_COMMUNITY): Payer: Self-pay | Admitting: Vascular Surgery

## 2011-09-21 ENCOUNTER — Ambulatory Visit (HOSPITAL_COMMUNITY)
Admission: RE | Admit: 2011-09-21 | Discharge: 2011-09-22 | Disposition: A | Discharge status: Home / Self Care | Payer: Medicare Other | Source: Ambulatory Visit | Attending: Otolaryngology | Admitting: Otolaryngology

## 2011-09-21 ENCOUNTER — Ambulatory Visit (HOSPITAL_COMMUNITY): Payer: Medicare Other | Admitting: Vascular Surgery

## 2011-09-21 ENCOUNTER — Encounter (HOSPITAL_COMMUNITY): Payer: Self-pay | Admitting: Otolaryngology

## 2011-09-21 DIAGNOSIS — Z87891 Personal history of nicotine dependence: Secondary | ICD-10-CM | POA: Insufficient documentation

## 2011-09-21 DIAGNOSIS — E039 Hypothyroidism, unspecified: Secondary | ICD-10-CM | POA: Insufficient documentation

## 2011-09-21 DIAGNOSIS — Z01812 Encounter for preprocedural laboratory examination: Secondary | ICD-10-CM | POA: Insufficient documentation

## 2011-09-21 DIAGNOSIS — G4730 Sleep apnea, unspecified: Secondary | ICD-10-CM | POA: Insufficient documentation

## 2011-09-21 DIAGNOSIS — I10 Essential (primary) hypertension: Secondary | ICD-10-CM | POA: Insufficient documentation

## 2011-09-21 DIAGNOSIS — I2510 Atherosclerotic heart disease of native coronary artery without angina pectoris: Secondary | ICD-10-CM | POA: Insufficient documentation

## 2011-09-21 DIAGNOSIS — K219 Gastro-esophageal reflux disease without esophagitis: Secondary | ICD-10-CM | POA: Insufficient documentation

## 2011-09-21 DIAGNOSIS — J342 Deviated nasal septum: Secondary | ICD-10-CM | POA: Insufficient documentation

## 2011-09-21 HISTORY — DX: Adverse effect of unspecified anesthetic, initial encounter: T41.45XA

## 2011-09-21 HISTORY — PX: TONSILLECTOMY: SUR1361

## 2011-09-21 HISTORY — DX: Other complications of anesthesia, initial encounter: T88.59XA

## 2011-09-21 HISTORY — PX: UVULOPALATOPHARYNGOPLASTY, TONSILLECTOMY AND SEPTOPLASTY: SHX2632

## 2011-09-21 SURGERY — SEPTOPLASTY, NOSE, WITH TONSILLECTOMY AND UPPP
Anesthesia: General | Site: Mouth | Laterality: Bilateral | Wound class: Clean Contaminated

## 2011-09-21 MED ORDER — MIDAZOLAM HCL 5 MG/5ML IJ SOLN
INTRAMUSCULAR | Status: DC | PRN
  Administered 2011-09-21 (×2): 1 mg via INTRAVENOUS

## 2011-09-21 MED ORDER — NEOSTIGMINE METHYLSULFATE 1 MG/ML IJ SOLN
INTRAMUSCULAR | Status: DC | PRN
  Administered 2011-09-21: 4 mg via INTRAVENOUS

## 2011-09-21 MED ORDER — FENTANYL CITRATE 0.05 MG/ML IJ SOLN
INTRAMUSCULAR | Status: DC | PRN
  Administered 2011-09-21 (×2): 50 ug via INTRAVENOUS
  Administered 2011-09-21: 100 ug via INTRAVENOUS

## 2011-09-21 MED ORDER — DROPERIDOL 2.5 MG/ML IJ SOLN: 0.6250 mg | INTRAMUSCULAR | Status: DC | PRN

## 2011-09-21 MED ORDER — DEXTROSE 5 % IV SOLN
INTRAVENOUS | Status: AC
  Filled 2011-09-21: qty 50

## 2011-09-21 MED ORDER — SODIUM CHLORIDE 0.9 % IR SOLN
Status: DC | PRN
  Administered 2011-09-21: 1000 mL

## 2011-09-21 MED ORDER — DEXAMETHASONE SODIUM PHOSPHATE 4 MG/ML IJ SOLN
INTRAMUSCULAR | Status: DC | PRN
  Administered 2011-09-21: 8 mg via INTRAVENOUS

## 2011-09-21 MED ORDER — PHENYLEPHRINE HCL 10 MG/ML IJ SOLN
INTRAMUSCULAR | Status: DC | PRN
  Administered 2011-09-21 (×3): 80 ug via INTRAVENOUS
  Administered 2011-09-21: 40 ug via INTRAVENOUS
  Administered 2011-09-21: 80 ug via INTRAVENOUS

## 2011-09-21 MED ORDER — LIDOCAINE-EPINEPHRINE 1 %-1:100000 IJ SOLN
INTRAMUSCULAR | Status: DC | PRN
  Administered 2011-09-21: 2.5 mL

## 2011-09-21 MED ORDER — GLYCOPYRROLATE 0.2 MG/ML IJ SOLN
INTRAMUSCULAR | Status: DC | PRN
  Administered 2011-09-21: .6 mg via INTRAVENOUS

## 2011-09-21 MED ORDER — SODIUM CHLORIDE 0.9 % IV SOLN
INTRAVENOUS | Status: DC
  Administered 2011-09-21 – 2011-09-22 (×2): via INTRAVENOUS

## 2011-09-21 MED ORDER — POLYETHYLENE GLYCOL 3350 17 G PO PACK
17.0000 g | PACK | Freq: Every day | ORAL | Status: DC
  Administered 2011-09-21 – 2011-09-22 (×2): 17 g via ORAL
  Filled 2011-09-21 (×2): qty 1

## 2011-09-21 MED ORDER — CLINDAMYCIN PHOSPHATE 600 MG/4ML IJ SOLN
INTRAMUSCULAR | Status: AC
  Filled 2011-09-21: qty 4

## 2011-09-21 MED ORDER — PROPOFOL 10 MG/ML IV EMUL
INTRAVENOUS | Status: DC | PRN
  Administered 2011-09-21: 150 mg via INTRAVENOUS
  Administered 2011-09-21: 50 mg via INTRAVENOUS

## 2011-09-21 MED ORDER — PANTOPRAZOLE SODIUM 40 MG PO TBEC
40.0000 mg | DELAYED_RELEASE_TABLET | Freq: Every day | ORAL | Status: DC
  Filled 2011-09-21: qty 1

## 2011-09-21 MED ORDER — COCAINE HCL POWD
Status: AC
  Filled 2011-09-21: qty 200

## 2011-09-21 MED ORDER — WHITE PETROLATUM GEL
Status: AC
  Filled 2011-09-21: qty 5

## 2011-09-21 MED ORDER — LOSARTAN POTASSIUM-HCTZ 50-12.5 MG PO TABS: 1.0000 | ORAL_TABLET | Freq: Every day | ORAL | Status: DC

## 2011-09-21 MED ORDER — COCAINE HCL POWD
Status: DC | PRN
  Administered 2011-09-21: 200 mg via NASAL

## 2011-09-21 MED ORDER — LIDOCAINE HCL 4 % MT SOLN
OROMUCOSAL | Status: DC | PRN
  Administered 2011-09-21: 4 mL via TOPICAL

## 2011-09-21 MED ORDER — DARIFENACIN HYDROBROMIDE ER 7.5 MG PO TB24
7.5000 mg | ORAL_TABLET | Freq: Every day | ORAL | Status: DC
  Administered 2011-09-21 – 2011-09-22 (×2): 7.5 mg via ORAL
  Filled 2011-09-21 (×2): qty 1

## 2011-09-21 MED ORDER — HYDROCHLOROTHIAZIDE 12.5 MG PO CAPS
12.5000 mg | ORAL_CAPSULE | Freq: Every day | ORAL | Status: DC
  Administered 2011-09-22: 12.5 mg via ORAL
  Filled 2011-09-21 (×2): qty 1

## 2011-09-21 MED ORDER — LOSARTAN POTASSIUM 50 MG PO TABS
50.0000 mg | ORAL_TABLET | Freq: Every day | ORAL | Status: DC
  Administered 2011-09-22: 50 mg via ORAL
  Filled 2011-09-21 (×2): qty 1

## 2011-09-21 MED ORDER — EPHEDRINE SULFATE 50 MG/ML IJ SOLN
INTRAMUSCULAR | Status: DC | PRN
  Administered 2011-09-21 (×3): 5 mg via INTRAVENOUS

## 2011-09-21 MED ORDER — BACITRACIN-NEOMYCIN-POLYMYXIN 400-5-5000 EX OINT
TOPICAL_OINTMENT | CUTANEOUS | Status: DC | PRN
  Administered 2011-09-21: 1 via TOPICAL

## 2011-09-21 MED ORDER — CLINDAMYCIN PHOSPHATE 600 MG/50ML IV SOLN
600.0000 mg | Freq: Three times a day (TID) | INTRAVENOUS | Status: DC
  Administered 2011-09-21 – 2011-09-22 (×3): 600 mg via INTRAVENOUS
  Filled 2011-09-21 (×5): qty 50

## 2011-09-21 MED ORDER — LEVOTHYROXINE SODIUM 200 MCG PO TABS: 200.0000 ug | ORAL_TABLET | ORAL | Status: DC

## 2011-09-21 MED ORDER — ONDANSETRON HCL 4 MG PO TABS: 4.0000 mg | ORAL_TABLET | ORAL | Status: DC | PRN

## 2011-09-21 MED ORDER — HYDROCHLOROTHIAZIDE 25 MG PO TABS
25.0000 mg | ORAL_TABLET | Freq: Every day | ORAL | Status: DC
  Administered 2011-09-22: 25 mg via ORAL
  Filled 2011-09-21 (×2): qty 1

## 2011-09-21 MED ORDER — LACTATED RINGERS IV SOLN
INTRAVENOUS | Status: DC | PRN
  Administered 2011-09-21 (×2): via INTRAVENOUS

## 2011-09-21 MED ORDER — CLINDAMYCIN PHOSPHATE 600 MG/50ML IV SOLN
INTRAVENOUS | Status: DC | PRN
  Administered 2011-09-21: 600 mg via INTRAVENOUS

## 2011-09-21 MED ORDER — ROCURONIUM BROMIDE 100 MG/10ML IV SOLN
INTRAVENOUS | Status: DC | PRN
  Administered 2011-09-21: 50 mg via INTRAVENOUS

## 2011-09-21 MED ORDER — IBUPROFEN 100 MG/5ML PO SUSP
400.0000 mg | Freq: Four times a day (QID) | ORAL | Status: DC | PRN
  Administered 2011-09-21 – 2011-09-22 (×3): 400 mg via ORAL
  Filled 2011-09-21 (×4): qty 20

## 2011-09-21 MED ORDER — HYDROMORPHONE HCL PF 1 MG/ML IJ SOLN
0.2500 mg | INTRAMUSCULAR | Status: DC | PRN
  Administered 2011-09-21 (×2): 0.25 mg via INTRAVENOUS

## 2011-09-21 MED ORDER — HYDROCODONE-ACETAMINOPHEN 7.5-500 MG/15ML PO SOLN
10.0000 mL | ORAL | Status: DC | PRN
  Administered 2011-09-21 – 2011-09-22 (×5): 15 mL via ORAL
  Filled 2011-09-21 (×5): qty 15

## 2011-09-21 MED ORDER — SIMVASTATIN 20 MG PO TABS
20.0000 mg | ORAL_TABLET | Freq: Every day | ORAL | Status: DC
  Administered 2011-09-21: 20 mg via ORAL
  Filled 2011-09-21 (×2): qty 1

## 2011-09-21 MED ORDER — ONDANSETRON HCL 4 MG/2ML IJ SOLN
INTRAMUSCULAR | Status: DC | PRN
  Administered 2011-09-21: 4 mg via INTRAVENOUS

## 2011-09-21 MED ORDER — LEVOTHYROXINE SODIUM 200 MCG PO TABS
200.0000 ug | ORAL_TABLET | ORAL | Status: DC
  Administered 2011-09-22: 200 ug via ORAL
  Filled 2011-09-21 (×2): qty 1

## 2011-09-21 MED ORDER — ONDANSETRON HCL 4 MG/2ML IJ SOLN: 4.0000 mg | INTRAMUSCULAR | Status: DC | PRN

## 2011-09-21 MED ORDER — PHENYLEPHRINE HCL 10 MG/ML IJ SOLN
10.0000 mg | INTRAVENOUS | Status: DC | PRN
  Administered 2011-09-21: 20 ug/min via INTRAVENOUS

## 2011-09-21 SURGICAL SUPPLY — 39 items
CANISTER SUCTION 2500CC (MISCELLANEOUS) ×3 IMPLANT
CLEANER TIP ELECTROSURG 2X2 (MISCELLANEOUS) ×3 IMPLANT
CLOTH BEACON ORANGE TIMEOUT ST (SAFETY) ×3 IMPLANT
COAGULATOR SUCT SWTCH 10FR 6 (ELECTROSURGICAL) IMPLANT
COVER SURGICAL LIGHT HANDLE (MISCELLANEOUS) ×2 IMPLANT
DECANTER SPIKE VIAL GLASS SM (MISCELLANEOUS) ×3 IMPLANT
DRESSING NASAL POPE 10X1.5X2.5 (GAUZE/BANDAGES/DRESSINGS) ×1 IMPLANT
DRESSING TELFA 8X3 (GAUZE/BANDAGES/DRESSINGS) ×3 IMPLANT
DRSG NASAL POPE 10X1.5X2.5 (GAUZE/BANDAGES/DRESSINGS) ×3
ELECT COATED BLADE 2.86 ST (ELECTRODE) ×3 IMPLANT
ELECT REM PT RETURN 9FT ADLT (ELECTROSURGICAL) ×3
ELECTRODE REM PT RTRN 9FT ADLT (ELECTROSURGICAL) ×2 IMPLANT
GLOVE BIOGEL PI IND STRL 6.5 (GLOVE) ×1 IMPLANT
GLOVE BIOGEL PI IND STRL 7.5 (GLOVE) ×1 IMPLANT
GLOVE BIOGEL PI INDICATOR 6.5 (GLOVE) ×1
GLOVE BIOGEL PI INDICATOR 7.5 (GLOVE) ×1
GLOVE ECLIPSE 7.5 STRL STRAW (GLOVE) ×4 IMPLANT
GLOVE SURG SS PI 6.5 STRL IVOR (GLOVE) ×4 IMPLANT
GLOVE SURG SS PI 7.5 STRL IVOR (GLOVE) ×3 IMPLANT
GOWN PREVENTION PLUS XLARGE (GOWN DISPOSABLE) ×3 IMPLANT
GOWN STRL NON-REIN LRG LVL3 (GOWN DISPOSABLE) ×5 IMPLANT
HEMOSTAT SURGICEL .5X2 ABSORB (HEMOSTASIS) IMPLANT
KIT BASIN OR (CUSTOM PROCEDURE TRAY) ×3 IMPLANT
KIT ROOM TURNOVER OR (KITS) ×3 IMPLANT
NDL SPNL 25GX3.5 QUINCKE BL (NEEDLE) ×1 IMPLANT
NEEDLE SPNL 25GX3.5 QUINCKE BL (NEEDLE) ×3 IMPLANT
NS IRRIG 1000ML POUR BTL (IV SOLUTION) ×3 IMPLANT
PAD ARMBOARD 7.5X6 YLW CONV (MISCELLANEOUS) ×6 IMPLANT
PENCIL BUTTON HOLSTER BLD 10FT (ELECTRODE) ×3 IMPLANT
SPECIMEN JAR SMALL (MISCELLANEOUS) ×7 IMPLANT
SPONGE TONSIL 1 RF SGL (DISPOSABLE) ×2 IMPLANT
SUT PLAIN 4 0 ~~LOC~~ 1 (SUTURE) ×3 IMPLANT
SUT PLAIN 5 0 P 3 18 (SUTURE) ×3 IMPLANT
SYR 3ML LL SCALE MARK (SYRINGE) ×3 IMPLANT
TOWEL OR 17X24 6PK STRL BLUE (TOWEL DISPOSABLE) ×3 IMPLANT
TOWEL OR 17X26 10 PK STRL BLUE (TOWEL DISPOSABLE) ×3 IMPLANT
TRAY ENT MC OR (CUSTOM PROCEDURE TRAY) ×3 IMPLANT
WATER STERILE IRR 1000ML POUR (IV SOLUTION) ×1 IMPLANT
YANKAUER SUCT BULB TIP NO VENT (SUCTIONS) ×3 IMPLANT

## 2011-09-21 NOTE — H&P (View-Only) (Signed)
Anesthesia:  Patient is a 66 year old male scheduled for a UPPP, tonsillectomy, bilateral turbinate reduction, septal reconstruction on 09/21/11.  History includes post-operative N/V, former smoker, OSA, HTN, GERD, depression, hypothyroidism, polycythemia felt secondary to OSA and androgen supplements (sees Hematologist Dr. Eli Hose), dyspnea with normal PFTs on 03/20/08, prior neck, back, and prostate surgeries, and history of chest pain with non-obstructive CAD by cath in March 2010.  He is S/P left TKR on 02/19/11.  His Cardiologist is Dr. Tenny Craw.  He last saw her on 06/06/11.  He was free of chest pain and breathing was at baseline at that appointment.  EKG at that time showed NSR, LAFB, LVH which was present on his prior EKG from August 2012.  According to his last Cardiology notes, he had an abnormal adenosine Myoview on 06/08/08 that demonstrated inferior scar with mild to moderate peri-infarct ischemia. He subsequently underwent cardiac cath on 08/26/08 that showed: 1. Mild nonobstructive coronary artery disease (30% mid LAD, 40% proximal RCA).  2. Normal left ventricular function. EF 55-60%.  CXR on 02/10/11 showed no active disease. Stable chronic mild bronchitic changes.   Labs noted.  As mentioned he has a history of polycythemia with Hbg ranging between 16-18 according to Hematology notes. He last saw Dr. Clelia Croft on 06/08/11 (see Notes tab)  According to his assessment/plan at that time, he did not see any immediate need for phlebotomy but recommended definitive treatment for his OSA which he is now scheduled.  Anticipate he can proceed if no significant change in his status.

## 2011-09-21 NOTE — Discharge Instructions (Signed)
Soft diet continue pain meds no travel for 10 days

## 2011-09-21 NOTE — Anesthesia Preprocedure Evaluation (Addendum)
Anesthesia Evaluation  Patient identified by MRN, date of birth, ID band Patient awake    Reviewed: Allergy & Precautions, H&P , NPO status , Patient's Chart, lab work & pertinent test results  History of Anesthesia Complications (+) PONV  Airway Mallampati: III TM Distance: >3 FB Neck ROM: Full    Dental  (+) Edentulous Upper and Edentulous Lower   Pulmonary shortness of breath, sleep apnea , former smoker   Pulmonary exam normal       Cardiovascular hypertension, Pt. on medications + CAD Rhythm:Regular Rate:Normal     Neuro/Psych PSYCHIATRIC DISORDERS Depression    GI/Hepatic Neg liver ROS, GERD-  Medicated,  Endo/Other  Hypothyroidism Morbid obesity  Renal/GU negative Renal ROS     Musculoskeletal   Abdominal   Peds  Hematology   Anesthesia Other Findings   Reproductive/Obstetrics                          Anesthesia Physical Anesthesia Plan  ASA: III  Anesthesia Plan: General   Post-op Pain Management:    Induction: Intravenous  Airway Management Planned: Oral ETT  Additional Equipment:   Intra-op Plan:   Post-operative Plan:   Informed Consent: I have reviewed the patients History and Physical, chart, labs and discussed the procedure including the risks, benefits and alternatives for the proposed anesthesia with the patient or authorized representative who has indicated his/her understanding and acceptance.   Dental advisory given  Plan Discussed with: CRNA, Anesthesiologist and Surgeon  Anesthesia Plan Comments:         Anesthesia Quick Evaluation

## 2011-09-21 NOTE — Anesthesia Postprocedure Evaluation (Signed)
Anesthesia Post Note  Patient: [name] [name]  Procedure(s) Performed: Procedure(s) (LRB): UVULOPALATOPHARYNGOPLASTY (UPPP)/TONSILLECTOMY/SEPTOPLASTY (Bilateral)  Anesthesia type: general  Patient location: PACU  Post pain: Pain level controlled  Post assessment: Patient's Cardiovascular Status Stable  Last Vitals:  Filed Vitals:   09/21/11 0915  BP:   Pulse:   Temp: 36.3 C  Resp:     Post vital signs: Reviewed and stable  Level of consciousness: sedated  Complications: No apparent anesthesia complications

## 2011-09-21 NOTE — Progress Notes (Signed)
Post op note Patient doing well SaO2 range 90-93% Slight    Bleeding from left nose Oral cavity clear

## 2011-09-21 NOTE — Op Note (Signed)
[id number] operative note

## 2011-09-21 NOTE — Interval H&P Note (Signed)
History and Physical Interval Note:  09/21/2011 7:15 AM  [name] [name]  has presented today for surgery, with the diagnosis of septal deviation, sleep apena, turbinate hypertrophy  The various methods of treatment have been discussed with the patient and family. After consideration of risks, benefits and other options for treatment, the patient has consented to  Procedure(s) (LRB): UVULOPALATOPHARYNGOPLASTY (UPPP) (Bilateral) SEPTOPLASTY (Bilateral) as a surgical intervention .  The patients' history has been reviewed, patient examined, no change in status, stable for surgery.  I have reviewed the patients' chart and labs.  Questions were answered to the patient's satisfaction.     Hermelinda Medicus

## 2011-09-21 NOTE — Transfer of Care (Signed)
Immediate Anesthesia Transfer of Care Note  Patient: [name] [name]  Procedure(s) Performed: Procedure(s) (LRB): UVULOPALATOPHARYNGOPLASTY (UPPP)/TONSILLECTOMY/SEPTOPLASTY (Bilateral)  Patient Location: PACU  Anesthesia Type: General  Level of Consciousness: awake, alert  and oriented  Airway & Oxygen Therapy: Patient Spontanous Breathing and Patient connected to face mask  Post-op Assessment: Report given to PACU RN, Post -op Vital signs reviewed and stable and Patient moving all extremities X 4  Post vital signs: Reviewed and stable  Complications: No apparent anesthesia complications

## 2011-09-22 NOTE — Discharge Summary (Signed)
NAMEEMANNUEL, VISE NO.:  [id number]  MEDICAL RECORD NO.:  [id number]  LOCATION:  5154                         FACILITY:  [facility]  PHYSICIAN:  Hermelinda Medicus, M.D.   DATE OF BIRTH:  09/18/1945  DATE OF ADMISSION:  09/21/2011 DATE OF DISCHARGE:                              DISCHARGE SUMMARY   HISTORY OF PRESENT ILLNESS:  He is a 66 year old male who I have followed since 2007.  He has had severe septal deviation about almost 100% on one side and on the opposite side he has about 70-80%.  He has been on CPAP essentially that has failed because he has had such a high cm of water pressure, he says the CPAP blows it off his face.  He has had a polysomnogram in course of a sleep time of 291 minutes, 0 time stage, 325 minutes and stage REM which is less than 10%, and it should be approximately 30%.  His nadir O2 82%, RDI is 37.3, slowest heart rate is 53.  His CPAP was set so strong that he literally could not use.  He wishes to consider surgery which would mean a septal reconstruction, turbinate reduction, as well as a tonsillectomy because he has very large tonsils which show exudate, he also has very narrow pharynx, oropharynx, and a very low palate with 3 times the size of normal uvula. He has past history of hypothyroidism, depression which I think is somewhat secondary to sleep deprivation because he does not sleep well. His wife states he cannot even eat very well with his large tonsils and narrow throat.  He does have also gastroesophageal reflux, typical of a sleep apnea patient with chronic nasal obstruction.  MEDICATIONS:  Are in the initial note. 1. MiraLax suspension once a day. 2. Aspirin 81 nasal spray solution. 3. VESIcare 10. 4. AndroGel 1%, 8 pumps a day. 5. Nexium 40. 6. Synthroid 200 mcg 6 days a week. 7. Hydrochlorothiazide 25. 8. Losartan potassium 50 mg once a day. 9. Simvastatin 20 once a day. 10.Citalopram hydrobromide 20 mg, which has  been discontinued     recently. 11.Nasonex nasal spray. 12.Zyrtec 10 mg.  PAST MEDICAL HISTORY: 1. Hypertension. 2. Hypothyroidism. 3. Gastroesophageal reflux. 4. Osteoarthritis. 5. Cervical spondylosis. 6. Obesity. 7. Sleep apnea. 8. Hypogonadism. 9. Coronary artery disease.  He has had a complete examination and has been cleared by Dr. Dietrich Pates.  He has had a complete cardiovascular evaluation, has been cleared for surgery by Dr. Dietrich Pates.  The details are in the initial chart. His general history he smoked until he quit in 1973.  Does drink some coffee, Pepsi.  He has also had a orthopedic surgery in the past.  PHYSICAL EXAMINATION:  HEENT:  The tympanic membranes to be completely clear and move well.  He has a severe septal deviation and turbinate hypertrophy which is blocking his nose.  He has a very large tonsils with small narrow oral cavity.  His larynx is clear.  True cords, false cords, epiglottis, base of tongue, lateral pharyngeal walls are within normal limits except he does have some reflux esophagitis issue. CHEST:  Clear.  No rales, rhonchi, or wheezes.  Increased AP diameter. HEART:  Within normal limits.  He had a cardiac cath showing a mid LAD 30%, proximal RCA 30%, ejection fraction 50-60%.  He had a mild scar, moderate peri-infarct and old with history of ischemia.  Electrocardiogram was normal with left ventricular function normal.  Our plan would be to do an uvulopalatoplasty and a tonsillectomy with a septal reconstruction turbinate reduction.  LABORATORY VALUES:  Showing a hematocrit of 48.7, hemoglobin of 16.3, platelets 230.  His differential within normal limits.  Urinalysis is also within normal limits.  He is negative for MRSA and staph.  He underwent a septal reconstruction turbinate reduction with a tonsillectomy and uvulopalatoplasty and did extremely well postoperatively.  He was observed on 5100 with O2 levels approaching 90%,  sometimes falling into 88%.  I have removed his packing this morning, and he will be discharged as a 23 hour observation, will be followed up tomorrow for cleansing his nose.  His diagnosis is sleep apnea with septal deviation turbinate reduction failed CPAP, and his diagnoses are also hypertension, hypothyroidism, gastroesophageal reflux, osteoarthritis, cervical spondylosis, obesity, sleep apnea, hypogonadism, and coronary artery disease cleared by Dr. Tenny Craw.  His further followup will be then in 10 days, 3 weeks, 6 weeks, 3 months, 6 months, and a year.          ______________________________ Hermelinda Medicus, M.D.     JC/MEDQ  D:  09/22/2011  T:  09/22/2011  Job:  [id number]  cc:   Pricilla Riffle, MD, [facility] Armanda Magic, M.D. Duncan Dull, M.D.

## 2011-09-22 NOTE — Op Note (Signed)
NAMELENIX, KIDD NO.:  [id number]  MEDICAL RECORD NO.:  [id number]  LOCATION:  5154                         FACILITY:  [facility]  PHYSICIAN:  Hermelinda Medicus, M.D.   DATE OF BIRTH:  20-Mar-1946  DATE OF PROCEDURE: DATE OF DISCHARGE:                              OPERATIVE REPORT   This patient is a 66 year old male who has failed a CPAP.  He has had a set at 20 mL of water.  He has had an RDI of 37.3, his O2 nadir was 82%. He spent 0 stage and stage III in 25 minutes in REM, normal is about 30% of your time.  He was approximately less than 10% of the 291 minutes sleep time.  He also takes Nexium for reflux which is aggravated course by sleep apnea, so essentially just failed CPAP.  He is aware of the risks and gains.  Our plan is to do a tonsillectomy and a uvulopalatoplasty plus a septal reconstruction as he has had a history of trauma to his nose as well as a turbinate reduction, and he is aware that he is going to have some congestion and some pain with this tonsillectomy and that he cannot travel for about 10-12 days to any out of the way places, and he is to be on soft bland diet, and he could have some bleeding from his tonsils of specifically each aggressively.  PREOPERATIVE DIAGNOSIS:  Sleep apnea with a septal deviation right side approximately 80-90% obstruction, left side off the premaxillary crest of 70%, turbinate hypertrophy with tonsillar hypertrophy with a very large long uvula and a low redundant palate.  POSTOPERATIVE DIAGNOSIS:  Sleep apnea with a septal deviation right side approximately 80-90% obstruction, left side off the premaxillary crest of 70%, turbinate hypertrophy with tonsillar hypertrophy with a very large long uvula and a low redundant palate.  OPERATION:  Septal reconstruction, turbinate reduction with a tonsillectomy and an uvulopalatoplasty under general endotracheal anesthesia with Dr. Krista Blue.  SURGEON:  Hermelinda Medicus,  M.D.  PROCEDURE:  The patient was placed in supine position under general endotracheal anesthesia.  The patient was prepped and draped in the usual manner using the facial prep and then the drape was with the usual head drape and body drape.  The septum was first approached where the anesthesia was 1% Xylocaine with epinephrine at 4 mL and topical cocaine 200 mg.  The inferior turbinates were aggressively outfractured, and we gained considerable space here on those turbinates just on gaining some nasal airway.  We then made a hemitransfixion incision on the right side and worked our way back toward a quadrilateral cartilage and ethmoid septal deviation on the right side and corrected this by using the open and close Morgan Jemmott.  We then worked our way on the left side back where was off the premaxillary crest and took a 4 mm chisel and took down this spur off the premaxillary crest bringing the septum back to the midline and establishing it in the middle. The closure of the septal surgery was with 5-0 plain catgut on the hemitransfixion incision, and then through and through septal suture as a blanket stitch using 4-0  plain x2. This minimizes any chance of hematoma or swelling within the septum.  We then placed 2 Telfa packings, and we oriented ourselves toward the oral cavity where the Crowe-Davis mouth gag was placed, and the uvula was found to be approximately 3 times its normal size plus very large tonsils which were deeply buried and also were filled with infectious and food debris.  We removed the tonsils using the sharp, blunt, and Bovie electrocoagulation, the tonsils were very large in size, taking up considerable space making his oral cavity extremely narrow in that posterior area.  He is a large man.  Weight is 250 pounds with a very full neck where his tongue rode quite high as well.  His both tonsils were removed without difficulty.  The uvula was trimmed, and the  palate was trimmed and then the closure of anterior- posterior mucous membrane was completed using 5-0 plain catgut.  All hemostasis was again checked, the stomach was suctioned.  The oral cavity was checked for any hemostasis, found to be completely under control, and we then removed the Telfa and placed a Merocel pack on his right side and a #8 anesthesia trumpet on the left to guarantee his airway when he is coming out of surgery.  The throat was again checked. Hemostasis was established, and there was probably blood loss estimated at 50 mL.  The patient was then awakened, tolerated procedure well, and is doing well postoperatively.  His follow up will be overnight with a pulse oximeter on 5100, and then he will be seen again under my care on Thursday, and then in 1 week, 3 weeks, 6 weeks, 3 months, 6 months, and a year.          ______________________________ Hermelinda Medicus, M.D.     JC/MEDQ  D:  09/21/2011  T:  09/21/2011  Job:  [id number]  cc:   Duncan Dull, M.D. Pricilla Riffle, MD, [facility] Armanda Magic, M.D.

## 2011-09-22 NOTE — Op Note (Addendum)
discharge sum [id number]   CSN [id number]

## 2011-09-22 NOTE — Progress Notes (Signed)
DC instructions given to pt and wife by Dr. Haroldine Laws. No verbal questions at this time. Asal packing removed by Dr. Haroldine Laws

## 2011-09-23 ENCOUNTER — Ambulatory Visit (HOSPITAL_COMMUNITY): Admission: RE | Admit: 2011-09-23 | Payer: Medicare Other | Source: Ambulatory Visit | Admitting: Otolaryngology

## 2011-09-23 ENCOUNTER — Encounter (HOSPITAL_COMMUNITY): Admission: RE | Payer: Self-pay | Source: Ambulatory Visit

## 2011-09-23 SURGERY — SEPTOPLASTY, NOSE, WITH TONSILLECTOMY AND UPPP
Anesthesia: General

## 2011-12-15 ENCOUNTER — Telehealth: Payer: Self-pay | Admitting: Oncology

## 2011-12-15 ENCOUNTER — Other Ambulatory Visit (HOSPITAL_BASED_OUTPATIENT_CLINIC_OR_DEPARTMENT_OTHER): Payer: Medicare Other | Admitting: Lab

## 2011-12-15 ENCOUNTER — Ambulatory Visit (HOSPITAL_BASED_OUTPATIENT_CLINIC_OR_DEPARTMENT_OTHER): Payer: Medicare Other | Admitting: Oncology

## 2011-12-15 VITALS — BP 144/89 | HR 110 | Temp 97.5°F | Ht 69.0 in | Wt 271.1 lb

## 2011-12-15 DIAGNOSIS — D751 Secondary polycythemia: Secondary | ICD-10-CM

## 2011-12-15 DIAGNOSIS — G4733 Obstructive sleep apnea (adult) (pediatric): Secondary | ICD-10-CM

## 2011-12-15 DIAGNOSIS — D45 Polycythemia vera: Secondary | ICD-10-CM

## 2011-12-15 LAB — CBC WITH DIFFERENTIAL/PLATELET
BASO%: 0.5 % (ref 0.0–2.0)
EOS%: 1.6 % (ref 0.0–7.0)
HCT: 47 % (ref 38.4–49.9)
LYMPH%: 19.6 % (ref 14.0–49.0)
MCH: 31.6 pg (ref 27.2–33.4)
MCHC: 33.9 g/dL (ref 32.0–36.0)
NEUT%: 66.5 % (ref 39.0–75.0)
Platelets: 236 10*3/uL (ref 140–400)
RBC: 5.03 10*6/uL (ref 4.20–5.82)

## 2011-12-15 NOTE — Addendum Note (Signed)
Addended by: Sherre Poot on: 12/15/2011 09:32 AM   Modules accepted: Orders

## 2011-12-15 NOTE — Progress Notes (Signed)
Hematology and Oncology Follow Up Visit  [name] [name] [id number] [birth year] 66 y.o. 12/15/2011 9:21 AM  CC: [name] [name], M.D.    Principle Diagnosis:This is a 66 year old gentleman with polycythemia, initially seen in March 2012, etiology most likely reactive due to obstructive sleep apnea and androgen supplements.   Current therapy: Observation and Surveillance.   Interim History:  [name] [name] presents today for a followup visit.  He is a pleasant 66 year old gentleman whom I saw back in March 2012 for evaluation for polycythemia.  His hemoglobin had ranged between 16 and 17 and up as high as 18 in December 2011.  He had normal white cell count and normal platelets.  He has had a lot of reasons to possibly be causing his erythrocytosis.  Causes of possible polycythemia would include obstructive sleep apnea as well as androgen supplements.  He has had really prototypical obstructive sleep apnea symptoms that include apneic episodes, snoring, he has really a thick neck and thick tonsils. He had tonsill surgery recently which have helped his symptoms. He had not had any major complications related to that, had not had any thrombotic events.  Did not report any bleeding complications.  Overall, performance status has been limited due to osteoarthritis of the knees.  Medications: I have reviewed the patient's current medications. Current outpatient prescriptions:DULoxetine (CYMBALTA) 30 MG capsule, Take 30 mg by mouth daily., Disp: , Rfl: ;  esomeprazole (NEXIUM) 40 MG capsule, Take 40 mg by mouth daily before breakfast.  , Disp: , Rfl: ;  hydrochlorothiazide (HYDRODIURIL) 25 MG tablet, Take 25 mg by mouth daily. , Disp: , Rfl: ;  levothyroxine (SYNTHROID, LEVOTHROID) 200 MCG tablet, Take 200 mcg by mouth as directed. Pt takes 6 days/week; none on 7th day, Disp: , Rfl:  losartan-hydrochlorothiazide (HYZAAR) 50-12.5 MG per tablet, Take 1 tablet by mouth daily.  , Disp: , Rfl: ;  polyethylene glycol  (MIRALAX) packet, Take 17 g by mouth daily.  , Disp: , Rfl: ;  simvastatin (ZOCOR) 20 MG tablet, Take 20 mg by mouth at bedtime., Disp: , Rfl: ;  solifenacin (VESICARE) 10 MG tablet, Take 10 mg by mouth daily. , Disp: , Rfl:  Testosterone (ANDROGEL PUMP) 20.25 MG/ACT (1.62%) GEL, Place 81 mg onto the skin daily. 4 pumps per day= 81mg , Disp: , Rfl: ;  DISCONTD: citalopram (CELEXA) 20 MG tablet, Take 20 mg by mouth daily.  , Disp: , Rfl:   Allergies:  Allergies  Allergen Reactions  . Penicillins Hives and Other (See Comments)    "I break out in welts"  . Codeine Nausea And Vomiting  . Sulfonamide Derivatives Nausea And Vomiting    Past Medical History, Surgical history, Social history, and Family History were reviewed and updated.  Review of Systems: Constitutional:  Negative for fever, chills, night sweats, anorexia, weight loss, pain. Cardiovascular: no chest pain or dyspnea on exertion Respiratory: no cough, shortness of breath, or wheezing Neurological: no TIA or stroke symptoms Dermatological: negative ENT: negative Skin: Negative. Gastrointestinal: no abdominal pain, change in bowel habits, or black or bloody stools Genito-Urinary: no dysuria, trouble voiding, or hematuria Hematological and Lymphatic: negative Breast: negative Musculoskeletal: negative Remaining ROS negative. Physical Exam: Blood pressure 144/89, pulse 110, temperature 97.5 F (36.4 C), temperature source Oral, height 5\' 9"  (1.753 m), weight 271 lb 1.6 oz (122.97 kg). ECOG: 1 General appearance: alert Head: Normocephalic, without obvious abnormality, atraumatic Neck: no adenopathy, no carotid bruit, no JVD, supple, symmetrical, trachea midline and thyroid not enlarged, symmetric,  no tenderness/mass/nodules Lymph nodes: Cervical, supraclavicular, and axillary nodes normal. Heart:regular rate and rhythm, S1, S2 normal, no murmur, click, rub or gallop Lung:chest clear, no wheezing, rales, normal symmetric air  entry Abdomin: soft, non-tender, without masses or organomegaly EXT:no erythema, induration, or nodules   Lab Results: Lab Results  Component Value Date   WBC 6.5 12/15/2011   HGB 15.9 12/15/2011   HCT 47.0 12/15/2011   MCV 93.4 12/15/2011   PLT 236 12/15/2011     Chemistry      Component Value Date/Time   NA 136 09/17/2011 0902   K 4.7 09/17/2011 0902   CL 97 09/17/2011 0902   CO2 29 09/17/2011 0902   BUN 15 09/17/2011 0902   CREATININE 0.80 09/17/2011 0902      Component Value Date/Time   CALCIUM 10.2 09/17/2011 0902   ALKPHOS 71 02/10/2011 0939   AST 25 02/10/2011 0939   ALT 27 02/10/2011 0939   BILITOT 0.5 02/10/2011 0939         Impression and Plan: This is a pleasant 66 year old gentleman with the following issues: 1. 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 This has normalized since his tonsill surgery. No further work up needed at this time. Will continue to monitor every 6 months.   2. Thrombosis prophylaxis.  I think low-dose aspirin would be a reasonable choice.    [facility], MD 6/19/20139:21 AM

## 2011-12-15 NOTE — Telephone Encounter (Signed)
appts made and printed for pt aom °

## 2012-04-04 ENCOUNTER — Telehealth: Payer: Self-pay | Admitting: Internal Medicine

## 2012-04-04 NOTE — Telephone Encounter (Signed)
Pt needs new rx refill to [facility] for 90 days, simvastatin 20 mg

## 2012-04-05 ENCOUNTER — Other Ambulatory Visit: Payer: Self-pay | Admitting: Cardiology

## 2012-04-05 MED ORDER — SIMVASTATIN 20 MG PO TABS: 20.0000 mg | ORAL_TABLET | Freq: Every day | ORAL | Status: DC

## 2012-04-05 NOTE — Telephone Encounter (Signed)
Called patient and left message that I sent in his refill request to [facility] and that if he had any questions to give me a call back at [phone].

## 2012-04-14 ENCOUNTER — Other Ambulatory Visit: Payer: Self-pay | Admitting: *Deleted

## 2012-04-14 MED ORDER — SIMVASTATIN 20 MG PO TABS: 20.0000 mg | ORAL_TABLET | Freq: Every day | ORAL | Status: DC

## 2012-04-14 NOTE — Telephone Encounter (Signed)
Pt calling re refill request to [facility] supply, simvastatin 20 mg, still not there, requested 10-8, can it be faxed today [phone] or call [phone]

## 2012-04-20 ENCOUNTER — Other Ambulatory Visit: Payer: Self-pay | Admitting: Internal Medicine

## 2012-04-20 MED ORDER — SIMVASTATIN 20 MG PO TABS: 20.0000 mg | ORAL_TABLET | Freq: Every day | ORAL | Status: DC

## 2012-04-20 NOTE — Telephone Encounter (Signed)
Follow-up:    Patient's wife called in needing a refill of her husbands simvastatin (ZOCOR) 20 MG tablet filled with [facility]. Please call once the order has been placed and feel free to leave a message

## 2012-04-20 NOTE — Telephone Encounter (Signed)
LMOM that I sent refill for generic Zocor to mail order pharmacy.

## 2012-04-25 ENCOUNTER — Other Ambulatory Visit: Payer: Self-pay | Admitting: *Deleted

## 2012-04-25 MED ORDER — SIMVASTATIN 20 MG PO TABS: 20.0000 mg | ORAL_TABLET | Freq: Every day | ORAL | Status: DC

## 2012-05-24 ENCOUNTER — Other Ambulatory Visit (HOSPITAL_COMMUNITY): Payer: Self-pay | Admitting: Neurological Surgery

## 2012-05-24 ENCOUNTER — Other Ambulatory Visit: Payer: Self-pay | Admitting: Neurological Surgery

## 2012-05-24 DIAGNOSIS — M47812 Spondylosis without myelopathy or radiculopathy, cervical region: Secondary | ICD-10-CM

## 2012-05-24 DIAGNOSIS — M545 Low back pain: Secondary | ICD-10-CM

## 2012-05-24 DIAGNOSIS — M546 Pain in thoracic spine: Secondary | ICD-10-CM

## 2012-05-26 ENCOUNTER — Encounter (HOSPITAL_COMMUNITY): Payer: Self-pay | Admitting: Respiratory Therapy

## 2012-05-30 ENCOUNTER — Ambulatory Visit (HOSPITAL_COMMUNITY)
Admission: RE | Admit: 2012-05-30 | Discharge: 2012-05-30 | Disposition: A | Discharge status: Home / Self Care | Payer: Medicare Other | Source: Ambulatory Visit | Attending: Neurological Surgery | Admitting: Neurological Surgery

## 2012-05-30 ENCOUNTER — Encounter (HOSPITAL_COMMUNITY): Payer: Self-pay

## 2012-05-30 DIAGNOSIS — M47812 Spondylosis without myelopathy or radiculopathy, cervical region: Secondary | ICD-10-CM

## 2012-05-30 DIAGNOSIS — M545 Low back pain, unspecified: Secondary | ICD-10-CM | POA: Insufficient documentation

## 2012-05-30 DIAGNOSIS — M546 Pain in thoracic spine: Secondary | ICD-10-CM

## 2012-05-30 DIAGNOSIS — Z981 Arthrodesis status: Secondary | ICD-10-CM | POA: Insufficient documentation

## 2012-05-30 MED ORDER — HYDROCODONE-ACETAMINOPHEN 5-325 MG PO TABS
1.0000 | ORAL_TABLET | ORAL | Status: DC | PRN
  Administered 2012-05-30 (×2): 1 via ORAL

## 2012-05-30 MED ORDER — IOHEXOL 300 MG/ML  SOLN
10.0000 mL | Freq: Once | INTRAMUSCULAR | Status: AC | PRN
  Administered 2012-05-30: 10 mL via INTRATHECAL

## 2012-05-30 MED ORDER — HYDROCODONE-ACETAMINOPHEN 5-325 MG PO TABS
ORAL_TABLET | ORAL | Status: AC
  Filled 2012-05-30: qty 1

## 2012-05-30 MED ORDER — ONDANSETRON HCL 4 MG/2ML IJ SOLN: 4.0000 mg | Freq: Four times a day (QID) | INTRAMUSCULAR | Status: DC | PRN

## 2012-05-30 MED ORDER — DIAZEPAM 5 MG PO TABS
ORAL_TABLET | ORAL | Status: AC
  Administered 2012-05-30: 10 mg via ORAL
  Filled 2012-05-30: qty 2

## 2012-05-30 MED ORDER — DIAZEPAM 5 MG PO TABS
10.0000 mg | ORAL_TABLET | Freq: Once | ORAL | Status: AC
  Administered 2012-05-30: 10 mg via ORAL

## 2012-05-30 MED ORDER — DIAZEPAM 5 MG PO TABS: 10.0000 mg | ORAL_TABLET | Freq: Once | ORAL | Status: DC

## 2012-05-30 NOTE — Progress Notes (Signed)
C/O pain 3/10 post myelogram po pain meds as ordered

## 2012-05-30 NOTE — Procedures (Signed)
05/24/2012:  [name] returns to the office today. I had last visited with him in December of 2012 when I reviewed some plain radiographs of his lumbar spine.  It demonstrated that he had a solid arthrodesis from L2 to the sacrum.   Clinically, [name] [name] tells me that he has been having considerably more problems with back pain, but he also gets some neck pain, headaches, and dysesthesias into his upper extremities.  His stamina on his feet is terrible and he notes that his balance is particularly a concern.  He has a great deal of difficulty if he has to turn rapidly, that he will lose his balance substantially.    Today on observing his gait, I note that he has a moderately wide-based gait. He is grasping for a wall whenever he can to help support him.  His motor strength, however, in the major groups, particularly the iliopsoas, the quadriceps, tibialis anterior, and the gastrocnemii appears intact on observing his gait.  His upper extremity strength also appears good.  His reflexes are trace in the biceps and triceps.  He has no reflexes in the distal lower extremities.    I obtained some new radiographs of the patient's cervical spine and lumbar spine today in the office.  The cervical spine x-rays demonstrate that the patient has spondylitic changes throughout the cervical spine.  He has a one-level anterior decompression and arthrodesis at the level of C3-4 with a plate well placed and bone graft nicely formed.  The other levels, particularly C5-6 shows some significant osteophytosis in the posterior aspect of the vertebral body.    The impression on the cervical spine is that the patient has anterior cervical decompression and fusion at C3-4 with advanced spondylosis below.    As regards to his lumbar spine, the patient has evidence of arthrodesis from L2 to the sacrum, with good alignment in the coronal and sagittal planes.  The adjacent level above his arthrodesis, that is L1-2, shows only modest  degenerative changes, but no evidence of any retrolisthesis or significant malalignment. Coronal alignment is good on the AP view.    Impression on the x-Nameer of the AP and lateral lumbar spine is that the patient has a solid arthrodesis L2 to the sacrum, with adjacent level spondylosis.    At this time, I indicated to [name] [name] that the best way to work his back and his neck up would be with a myelogram and post-myelogram CT scan.  The last study that he had performed was in December of 2010 and at that time we saw that he did not have any overt compression, but he did have evidence of significant spondylitic disease.  This was in the cervical spine below his fusion. There was no overt compression of the cord.  We will proceed with myelography at the earliest convenience and hopefully gain some resolution or some idea of what can be done for [name] [name] in regards to his neck and also his back as this is the major area that has been bothering him.   Pre op Dx: Spondylosis with myelopathy cervical, thoracic, lumbar Post op Dx: Spondylosis with myelopathy cervical, thoracic, lumbar  Procedure: Total myelogram Surgeon: Danielle Dess Puncture level: L2-3 Fluid color: Clear colorless Injection: 9 cc iohexol 300 Findings: Diffuse spondylosis

## 2012-06-12 ENCOUNTER — Encounter: Payer: Self-pay | Admitting: Internal Medicine

## 2012-06-12 ENCOUNTER — Ambulatory Visit (INDEPENDENT_AMBULATORY_CARE_PROVIDER_SITE_OTHER): Payer: Medicare Other | Admitting: Internal Medicine

## 2012-06-12 VITALS — BP 105/63 | HR 84 | Ht 71.0 in | Wt 275.0 lb

## 2012-06-12 DIAGNOSIS — I2584 Coronary atherosclerosis due to calcified coronary lesion: Secondary | ICD-10-CM

## 2012-06-12 NOTE — Progress Notes (Addendum)
HPI Patient is a 66 year old with a history of nonobstructive CAD by cardiac catheterization in 3/10. At that time he had a mid LAD 30% and proximal RCA 30% with an EF of 55-60%. Myoview prior to his cardiac catheterization 2/10 demonstrated inferior scar with mild to moderate peri-infarct ischemia. Echocardiogram 3/03 demonstrated normal LV function. His other medical history includes hypertension, hyperlipidemia, hypothyroidism, depression, testosterone deficiency, obstructive sleep apnea and degenerative joint disease.  He was last in clinic in December 2012.  Patinet notes occasional SOB   Occasional R sided chest pains.  COmes with associated HA    Allergies  Allergen Reactions  . Penicillins Hives and Other (See Comments)    "I break out in welts"  . Codeine Nausea And Vomiting  . Sulfonamide Derivatives Nausea And Vomiting    Current Outpatient Prescriptions  Medication Sig Dispense Refill  . aspirin 81 MG tablet Take 2 tabs daily      . citalopram (CELEXA) 20 MG tablet Take 20 mg by mouth daily.      . hydrochlorothiazide (HYDRODIURIL) 25 MG tablet Take 25 mg by mouth daily.       [name] levothyroxine (SYNTHROID, LEVOTHROID) 200 MCG tablet Take 200 mcg by mouth daily.       [name] losartan (COZAAR) 50 MG tablet Take 50 mg by mouth daily.      [name] omeprazole (PRILOSEC) 20 MG capsule Take 20 mg by mouth daily.      . polyethylene glycol (MIRALAX) packet Take 17 g by mouth daily.        . simvastatin (ZOCOR) 20 MG tablet Take 1 tablet (20 mg total) by mouth at bedtime.  90 tablet  3  . Testosterone (ANDROGEL PUMP) 20.25 MG/ACT (1.62%) GEL Place 81 mg onto the skin daily. 6 pumps per day= 81mg       . Trospium Chloride 60 MG CP24 Take 60 mg by mouth daily.        Past Medical History  Diagnosis Date  . Observed sleep apnea     can't wear cpap  . HTN (hypertension)   . GERD (gastroesophageal reflux disease)   . Dyspnea     -PFTs compeltely normal 03/20/08 including DLc0  . Chest pain   .  Sleep apnea     Most recent sleep study 2010; records at [facility] office  . CAD (coronary artery disease)     cath 3/10: mLAD 30%, pRCA 30%, EF 55-60% Dr. Tenny Craw cardiologist  most recent  stress stress done ~ 2 years ago with Dr. Tenny Craw  . Hypothyroidism   . Low testosterone   . Arthritis   . Depression   . Polycythemia   . PONV (postoperative nausea and vomiting)   . Complication of anesthesia     "he usually gets an ileus after back OR"    Past Surgical History  Procedure Date  . Cataract extraction, bilateral 2010  . Meniscus repair 08/2008    left  . Tonsillectomy 09/21/11  . Uvulopalatopharyngoplasty, tonsillectomy and septoplasty 09/21/11  . Posterior fusion lumbar spine 2005; 01/2007; 04/2009    L5; L3-4; L2-3  . Lumbar disc surgery 06/2006    L3  . Back surgery 2005; 06/2006; 01/2007; 04/2009  . Prostate surgery 2000  . Transurethral resection of prostate 2006    followed by "surgery to get rid of clots"  . Shoulder open rotator cuff repair     left  . Cervical fusion 04/2002    ?C3-4  . Total knee  arthroplasty 01/2011    left  . Cardiac catheterization 08/2008    Family History  Problem Relation Age of Onset  . Anesthesia problems Neg Hx     History   Social History  . Marital Status: Married    Spouse Name: N/A    Number of Children: N/A  . Years of Education: N/A   Occupational History  . Not on file.   Social History Main Topics  . Smoking status: Former Smoker -- 1.5 packs/day for 18 years    Types: Cigarettes    Quit date: 06/28/1974  . Smokeless tobacco: Never Used  . Alcohol Use: No  . Drug Use: No  . Sexually Active: No   Other Topics Concern  . Not on file   Social History Narrative  . No narrative on file    Review of Systems:  All systems reviewed.  They are negative to the above problem except as previously stated.  Vital Signs: BP 105/63  Pulse 84  Ht 5\' 11"  (1.803 m)  Wt 275 lb (124.739 kg)  BMI 38.35 kg/m2  Physical  Exam Patient in NAD HEENT:  Normocephalic, atraumatic. EOMI, PERRLA.  Neck: JVP is normal.  No bruits.  Lungs: clear to auscultation. No rales no wheezes.  Heart: Regular rate and rhythm. Normal S1, S2. No S3.   No significant murmurs. PMI not displaced.  Abdomen:  Supple, nontender. Normal bowel sounds. No masses. No hepatomegaly.  Extremities:   Good distal pulses throughout. No lower extremity edema.  Musculoskeletal :moving all extremities.  Neuro:   alert and oriented x3.  CN I-XII grossly intact.  EKG:  NSR  84 bpm  Assessment and Plan:  1. CAD  Mild by cath in 2010  I am not convinced of any active symptoms.  R sided chest pain that has been going on for Awhile is atypical  Most likely musculoskeletal with radiation  2.  HTN  Good control  3.  HL  ON simvistatin.  Need to get lipids from Dr Kevan Ny.

## 2012-06-13 ENCOUNTER — Encounter: Payer: Self-pay | Admitting: Oncology

## 2012-06-13 ENCOUNTER — Telehealth: Payer: Self-pay | Admitting: Oncology

## 2012-06-13 ENCOUNTER — Ambulatory Visit (HOSPITAL_BASED_OUTPATIENT_CLINIC_OR_DEPARTMENT_OTHER): Payer: Medicare Other | Admitting: Oncology

## 2012-06-13 ENCOUNTER — Other Ambulatory Visit (HOSPITAL_BASED_OUTPATIENT_CLINIC_OR_DEPARTMENT_OTHER): Payer: Medicare Other

## 2012-06-13 VITALS — BP 129/73 | HR 81 | Temp 98.3°F | Resp 20 | Ht 71.0 in | Wt 274.2 lb

## 2012-06-13 DIAGNOSIS — G4733 Obstructive sleep apnea (adult) (pediatric): Secondary | ICD-10-CM

## 2012-06-13 DIAGNOSIS — R918 Other nonspecific abnormal finding of lung field: Secondary | ICD-10-CM

## 2012-06-13 DIAGNOSIS — D751 Secondary polycythemia: Secondary | ICD-10-CM

## 2012-06-13 DIAGNOSIS — R911 Solitary pulmonary nodule: Secondary | ICD-10-CM | POA: Insufficient documentation

## 2012-06-13 HISTORY — DX: Solitary pulmonary nodule: R91.1

## 2012-06-13 LAB — COMPREHENSIVE METABOLIC PANEL (CC13)
ALT: 26 U/L (ref 0–55)
AST: 20 U/L (ref 5–34)
Alkaline Phosphatase: 77 U/L (ref 40–150)
BUN: 15 mg/dL (ref 7.0–26.0)
Calcium: 9.5 mg/dL (ref 8.4–10.4)
Creatinine: 1 mg/dL (ref 0.7–1.3)
Total Bilirubin: 0.64 mg/dL (ref 0.20–1.20)

## 2012-06-13 LAB — CBC WITH DIFFERENTIAL/PLATELET
BASO%: 0.4 % (ref 0.0–2.0)
Basophils Absolute: 0 10*3/uL (ref 0.0–0.1)
EOS%: 1.5 % (ref 0.0–7.0)
HCT: 48.9 % (ref 38.4–49.9)
HGB: 16.5 g/dL (ref 13.0–17.1)
LYMPH%: 21 % (ref 14.0–49.0)
MCH: 29.8 pg (ref 27.2–33.4)
MCHC: 33.7 g/dL (ref 32.0–36.0)
MCV: 88.5 fL (ref 79.3–98.0)
MONO%: 9 % (ref 0.0–14.0)
NEUT%: 68.1 % (ref 39.0–75.0)

## 2012-06-13 NOTE — Telephone Encounter (Signed)
gv and printed pt appt schedule for June 2014...the patient aware central scheduling will call with d/t of Ct.

## 2012-06-13 NOTE — Progress Notes (Signed)
Hematology and Oncology Follow Up Visit  BRIGG CAPE [id number] Oct 11, 1945 66 y.o. 06/13/2012 10:43 AM  CC: Duncan Dull, M.D.    Principle Diagnosis:This is a 66 year old gentleman with polycythemia, initially seen in March 2012, etiology most likely reactive due to obstructive sleep apnea and androgen supplements.   Current therapy: Observation and Surveillance.   Interim History:  Mr. Kohles presents today for a followup visit.  He is a pleasant 66 year old gentleman initially seen in March 2012 for evaluation for polycythemia.  His hemoglobin had ranged between 16 and 17 and up as high as 18 in December 2011.  He had normal white cell count and normal platelets.  He has had a lot of reasons to possibly be causing his erythrocytosis.  Causes of possible polycythemia would include obstructive sleep apnea as well as androgen supplements.  He has had really prototypical obstructive sleep apnea symptoms that include apneic episodes, snoring, he has really a thick neck and thick tonsils. He had tonsill surgery with improvement of symptoms. He had not had any major complications related to that, had not had any thrombotic events.  Did not report any bleeding complications.  Overall, performance status has been limited due to osteoarthritis of the knees. The patient had a recent CT angiogram of his back. There were small pulmonary nodules noted. He has no hemoptysis and SOB.  Medications: I have reviewed the patient's current medications. Current outpatient prescriptions:aspirin 81 MG tablet, Take 2 tabs daily, Disp: , Rfl: ;  citalopram (CELEXA) 20 MG tablet, Take 20 mg by mouth daily., Disp: , Rfl: ;  hydrochlorothiazide (HYDRODIURIL) 25 MG tablet, Take 25 mg by mouth daily. , Disp: , Rfl: ;  levothyroxine (SYNTHROID, LEVOTHROID) 200 MCG tablet, Take 200 mcg by mouth daily. , Disp: , Rfl: ;  losartan (COZAAR) 50 MG tablet, Take 50 mg by mouth daily., Disp: , Rfl:  omeprazole (PRILOSEC) 20 MG  capsule, Take 20 mg by mouth daily., Disp: , Rfl: ;  polyethylene glycol (MIRALAX) packet, Take 17 g by mouth daily.  , Disp: , Rfl: ;  simvastatin (ZOCOR) 20 MG tablet, Take 1 tablet (20 mg total) by mouth at bedtime., Disp: 90 tablet, Rfl: 3;  Testosterone (ANDROGEL PUMP) 20.25 MG/ACT (1.62%) GEL, Place 81 mg onto the skin daily. 6 pumps per day= 81mg , Disp: , Rfl:  Trospium Chloride 60 MG CP24, Take 60 mg by mouth daily., Disp: , Rfl:   Allergies:  Allergies  Allergen Reactions  . Penicillins Hives and Other (See Comments)    "I break out in welts"  . Codeine Nausea And Vomiting  . Sulfonamide Derivatives Nausea And Vomiting    Past Medical History, Surgical history, Social history, and Family History were reviewed and updated.  Review of Systems: Constitutional:  Negative for fever, chills, night sweats, anorexia, weight loss, pain. Cardiovascular: no chest pain or dyspnea on exertion Respiratory: no cough, shortness of breath, or wheezing Neurological: no TIA or stroke symptoms Dermatological: negative ENT: negative Skin: Negative. Gastrointestinal: no abdominal pain, change in bowel habits, or black or bloody stools Genito-Urinary: no dysuria, trouble voiding, or hematuria Hematological and Lymphatic: negative Breast: negative Musculoskeletal: negative Remaining ROS negative.  Physical Exam: Blood pressure 129/73, pulse 81, temperature 98.3 F (36.8 C), temperature source Oral, resp. rate 20, height 5\' 11"  (1.803 m), weight 274 lb 3.2 oz (124.376 kg). ECOG: 1 General appearance: alert Head: Normocephalic, without obvious abnormality, atraumatic Neck: no adenopathy, no carotid bruit, no JVD, supple, symmetrical, trachea midline and  thyroid not enlarged, symmetric, no tenderness/mass/nodules Lymph nodes: Cervical, supraclavicular, and axillary nodes normal. Heart:regular rate and rhythm, S1, S2 normal, no murmur, click, rub or gallop Lung:chest clear, no wheezing, rales,  normal symmetric air entry Abdomen: soft, non-tender, without masses or organomegaly EXT:no erythema, induration, or nodules   Lab Results: Lab Results  Component Value Date   WBC 6.3 06/13/2012   HGB 16.5 06/13/2012   HCT 48.9 06/13/2012   MCV 88.5 06/13/2012   PLT 211 06/13/2012     Chemistry      Component Value Date/Time   NA 136 06/13/2012 0921   NA 136 09/17/2011 0902   K 4.4 06/13/2012 0921   K 4.7 09/17/2011 0902   CL 100 06/13/2012 0921   CL 97 09/17/2011 0902   CO2 28 06/13/2012 0921   CO2 29 09/17/2011 0902   BUN 15.0 06/13/2012 0921   BUN 15 09/17/2011 0902   CREATININE 1.0 06/13/2012 0921   CREATININE 0.80 09/17/2011 0902      Component Value Date/Time   CALCIUM 9.5 06/13/2012 0921   CALCIUM 10.2 09/17/2011 0902   ALKPHOS 77 06/13/2012 0921   ALKPHOS 71 02/10/2011 0939   AST 20 06/13/2012 0921   AST 25 02/10/2011 0939   ALT 26 06/13/2012 0921   ALT 27 02/10/2011 0939   BILITOT 0.64 06/13/2012 0921   BILITOT 0.5 02/10/2011 0939       Impression and Plan: This is a pleasant 66 year old gentleman with the following issues: 1. 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 This has normalized since his tonsil surgery. No further work up needed at this time. Will continue to monitor every 6 months. 2. Thrombosis prophylaxis. On low dose ASA. 3. Pulmonary nodules. Pt has a history of smoking 1.5-2 ppd for 18 years. I have ordered a CT of the chest in about 6 months to ensure stability.    Dovey Fatzinger 12/17/201310:43 AM

## 2012-06-19 ENCOUNTER — Encounter: Payer: Self-pay | Admitting: Internal Medicine

## 2012-06-20 ENCOUNTER — Telehealth: Payer: Self-pay | Admitting: *Deleted

## 2012-06-20 NOTE — Telephone Encounter (Signed)
Called Dr.Gates office. They will fax the last set of labs to Dr.Ross.

## 2012-07-17 ENCOUNTER — Encounter: Payer: Self-pay | Admitting: Internal Medicine

## 2012-08-15 ENCOUNTER — Encounter: Payer: Self-pay | Admitting: Internal Medicine

## 2012-08-15 ENCOUNTER — Ambulatory Visit (INDEPENDENT_AMBULATORY_CARE_PROVIDER_SITE_OTHER): Payer: Medicare Other | Admitting: Internal Medicine

## 2012-08-15 VITALS — BP 130/72 | HR 84 | Ht 69.0 in | Wt 286.1 lb

## 2012-08-15 DIAGNOSIS — K219 Gastro-esophageal reflux disease without esophagitis: Secondary | ICD-10-CM

## 2012-08-15 DIAGNOSIS — K5900 Constipation, unspecified: Secondary | ICD-10-CM

## 2012-08-15 DIAGNOSIS — R1314 Dysphagia, pharyngoesophageal phase: Secondary | ICD-10-CM

## 2012-08-15 NOTE — Patient Instructions (Addendum)
You have been scheduled for an endoscopy with propofol. Please follow written instructions given to you at your visit today. If you use inhalers (even only as needed) or a CPAP machine, please bring them with you on the day of your procedure.  Take a 1/2 dose of Miralax every day and see if that helps the diarrhea.  Thank you for choosing me and [facility].  Iva Boop, M.D., [facility]

## 2012-08-15 NOTE — Progress Notes (Signed)
Subjective:    Patient ID: [name] [name], male    DOB: 10-25-1945, 67 y.o.   MRN: [id number]  HPI Patient is a very nice middle-aged white man here with his wife. He is having intermittent dysphagia. He had a CT myelogram within the past couple of months that demonstrated thickening of the distal esophagus. He has a remote history of having an esophageal dilation in 1996. He takes Prilosec 20 mg daily and does not have much heartburn. However he is having intermittent dysphagia with a suprasternal sticking point and is modified his diet to avoid this. He wears dentures, and he generally wears both sets though it often leads the lower set out because of friction in the oral cavity. He has a long history, 15 years of intermittent sharp right upper chest wall pain that comes and goes a few times a week. Wonders if that is related to reflux. He has chronic constipation that is helped by MiraLax but he has to use a MiraLax intermittently because he may develop diarrhea. He had a colonoscopy in 2007 that was unremarkable. Allergies  Allergen Reactions  . Penicillins Hives and Other (See Comments)    "I break out in welts"  . Codeine Nausea And Vomiting  . Sulfonamide Derivatives Nausea And Vomiting   Outpatient Prescriptions Prior to Visit  Medication Sig Dispense Refill  . aspirin 81 MG tablet Take 2 tabs daily      . citalopram (CELEXA) 20 MG tablet Take 20 mg by mouth daily.      . hydrochlorothiazide (HYDRODIURIL) 25 MG tablet Take 25 mg by mouth daily.       [name] levothyroxine (SYNTHROID, LEVOTHROID) 200 MCG tablet Take 200 mcg by mouth daily.       [name] losartan (COZAAR) 50 MG tablet Take 50 mg by mouth daily.      [name] omeprazole (PRILOSEC) 20 MG capsule Take 20 mg by mouth daily.      . polyethylene glycol (MIRALAX) packet Take 17 g by mouth daily.        . simvastatin (ZOCOR) 20 MG tablet Take 1 tablet (20 mg total) by mouth at bedtime.  90 tablet  3  . Testosterone (ANDROGEL PUMP) 20.25 MG/ACT  (1.62%) GEL Place 81 mg onto the skin daily. 6 pumps per day= 81mg       . Trospium Chloride 60 MG CP24 Take 60 mg by mouth daily.       No facility-administered medications prior to visit.   Past Medical History  Diagnosis Date  . Observed sleep apnea     can't wear cpap  . HTN (hypertension)   . GERD (gastroesophageal reflux disease)   . Dyspnea     -PFTs compeltely normal 03/20/08 including DLc0  . Chest pain   . Sleep apnea     Most recent sleep study 2010; records at [facility] office  . CAD (coronary artery disease)     cath 3/10: mLAD 30%, pRCA 30%, EF 55-60% Dr. Tenny Craw cardiologist  most recent  stress stress done ~ 2 years ago with Dr. Tenny Craw  . Hypothyroidism   . Low testosterone   . Arthritis   . Depression   . Polycythemia   . PONV (postoperative nausea and vomiting)   . Complication of anesthesia     "he usually gets an ileus after back OR"  . Pulmonary nodule 06/13/2012  . Rosacea    Past Surgical History  Procedure Laterality Date  . Cataract extraction, bilateral  2010  .  Meniscus repair  08/2008    left  . Tonsillectomy  09/21/11  . Uvulopalatopharyngoplasty, tonsillectomy and septoplasty  09/21/11  . Posterior fusion lumbar spine  2005; 01/2007; 04/2009    L5; L3-4; L2-3  . Lumbar disc surgery  06/2006    L3  . Back surgery  2005; 06/2006; 01/2007; 04/2009  . Prostate surgery  2000  . Transurethral resection of prostate  2006    followed by "surgery to get rid of clots"  . Shoulder open rotator cuff repair      left  . Cervical fusion  04/2002    ?C3-4  . Total knee arthroplasty  01/2011    left  . Cardiac catheterization  08/2008  . Esophagogastroduodenoscopy  1996     [facility]  . Colonoscopy     History   Social History  . Marital Status: Married         Number of Children: 3  .     Occupational History  . retired    Social History Main Topics  . Smoking status: Former Smoker -- 1.50 packs/day for 18 years    Types: Cigarettes    Quit date:  06/28/1974  . Smokeless tobacco: Never Used  . Alcohol Use: No  . Drug Use: No  . Sexually Active: No     Family History  Problem Relation Age of Onset  . Anesthesia problems Neg Hx   . Breast cancer Mother   . Colon polyps Mother   . Diabetes Mother   . Breast cancer Sister   . Diabetes Sister     borderline  . Heart Problems Sister   . Heart attack Father   . Alzheimer's disease Father   . Lung cancer Paternal Uncle     x4  . Throat cancer Maternal Uncle   . Liver cancer Maternal Uncle    The patient is retired from the city of [place]     Review of Systems Somewhat diffusely positive, notable for postnasal drip. He has a mucous production at times. He has fatigue. He has reduced hearing and insomnia and dyspnea at times. He has problems with sleep apnea, he had uvula surgery and tonsillectomy last year but has not had another sleeping test. He had problems with a mask causing skin reactions. He has rosacea and recently tried MetroGel for a month but did not think it helped. His primary care physician has suggested he might have lupus but her rheumatologist says he does not. He does have myalgias and muscle aches and occasional headaches also. Back pain as a problem as well and is chronic. He has new diagnosis of pulmonary nodules and a CT of the chest is plan for about 6 months from now, these are tiny All other review of systems negative or as per history of present illness.    Objective:   Physical Exam  General:  Well-developed, well-nourished and in no acute distress Eyes:  anicteric. ENT:   Mouth and posterior pharynx free of lesions. + upper dentures Neck:   supple w/o thyromegaly or mass.  Lungs: Clear to auscultation bilaterally.Rare wheeze Heart:  S1S2, no rubs, murmurs, gallops. Abdomen:  obese soft, non-tender, no hepatosplenomegaly, hernia, or mass and BS+.  Lymph:  no cervical or supraclavicular adenopathy. Extremities:   Trace lower extremity  edema Skin   + rosacea Neuro:  A&O x 3.  Psych:  appropriate mood and  Affect.   Data Reviewed: CT myelogram, 2007 colonoscopy 1996 EGD, recent hematology oncology note  Assessment & Plan:   1. Dysphagia, pharyngoesophageal phase   2. GERD (gastroesophageal reflux disease)   Sounds like he has a symptomatic peptic stricture or esophagitis reflux. Plan for upper GI endoscopy and probable esophageal dilation. The risks and benefits as well as alternatives of endoscopic procedure(s) have been discussed and reviewed. All questions answered. The patient agrees to proceed.  We'll need to discuss reducing caffeine ingestion also he drinks 5 beverages daily If this is unrevealing then consider other workup.   3. constipation   Chronic issue, he sometimes has problems with diarrhea related to too much MiraLax and has difficulty regulating so I have suggested taking a half a dose and MiraLax every day    I appreciate the opportunity to care for this patient.  CC: Hollice Espy, MD

## 2012-08-18 ENCOUNTER — Ambulatory Visit (AMBULATORY_SURGERY_CENTER): Payer: Medicare Other | Admitting: Internal Medicine

## 2012-08-18 ENCOUNTER — Encounter: Payer: Self-pay | Admitting: Internal Medicine

## 2012-08-18 VITALS — BP 134/81 | HR 82 | Temp 98.2°F | Resp 25 | Ht 69.0 in | Wt 286.0 lb

## 2012-08-18 DIAGNOSIS — R1314 Dysphagia, pharyngoesophageal phase: Secondary | ICD-10-CM

## 2012-08-18 DIAGNOSIS — D130 Benign neoplasm of esophagus: Secondary | ICD-10-CM

## 2012-08-18 DIAGNOSIS — K2210 Ulcer of esophagus without bleeding: Secondary | ICD-10-CM

## 2012-08-18 MED ORDER — OMEPRAZOLE 40 MG PO CPDR: 40.0000 mg | DELAYED_RELEASE_CAPSULE | Freq: Every day | ORAL | Status: DC

## 2012-08-18 MED ORDER — SODIUM CHLORIDE 0.9 % IV SOLN: 500.0000 mL | INTRAVENOUS | Status: DC

## 2012-08-18 NOTE — Op Note (Signed)
[facility] [address] [place], [address]   ENDOSCOPY PROCEDURE REPORT  PATIENT: [name] [name]  MR#: [id number] BIRTHDATE: 05-Oct-1945 , 66  yrs. old GENDER: Male ENDOSCOPIST: Iva Boop, MD, [facility] PROCEDURE DATE:  08/18/2012 PROCEDURE:  EGD w/ biopsy and Maloney dilation of esophagus ASA CLASS:     Class III INDICATIONS:  Dysphagia. MEDICATIONS: propofol (Diprivan) 200mg  IV, MAC sedation, administered by CRNA, and These medications were titrated to patient response per physician's verbal order TOPICAL ANESTHETIC: Cetacaine Spray  DESCRIPTION OF PROCEDURE: After the risks benefits and alternatives of the procedure were thoroughly explained, informed consent was obtained.  The [facility] [id number]-[id number] endoscope was introduced through the mouth and advanced to the second portion of the duodenum. Without limitations.  The instrument was slowly withdrawn as the mucosa was fully examined.        ESOPHAGUS: Esophagitis was found in the middle third of the esophagus and upper third of the esophagus. Fissures, exudate and slight erosions seen.  Multiple biopsies were performed using cold forceps.  Sample sent for histology.  The remainder of the upper endoscopy exam was otherwise normal. Retroflexed views revealed no abnormalities.     The scope was then withdrawn from the patient, a [address] [name] was passed without much resistance, there was small heme (after bxs) and the procedure completed.  COMPLICATIONS: There were no complications. ENDOSCOPIC IMPRESSION: 1.   Esophagitis in the middle third of the esophagus and upper third of the esophagus; multiple biopsies 2.   The remainder of the upper endoscopy exam was otherwise normal - 48 Fr Maloney dilation performed to treat dysphagia  RECOMMENDATIONS: 1.  Clear liquids until 1130, then soft foods rest of day.  Resume prior diet tomorrow. 2.  Office will call with results/plans 3.  PPI qam - change  to omeprazole 40 mg   eSigned:  Iva Boop, MD, [facility] 08/18/2012 10:38 AM  VH:QIONG Kevan Ny, MD and The Patient

## 2012-08-18 NOTE — Patient Instructions (Addendum)
The esophagus was irritated and inflamed - "esophagitis". Could be from acid reflux - most likely. I took biopsies. The rest of the exam was ok.  I dilated the esophagus also to help swallowing.  I want you to increase omeprazole to 40 mg daily (new prescription sent) before breakfast.  You should also reduce the amount of pepsi and other caffeine you drink.  Once the biopsy results are in will contact you by phone.   Thank you for choosing me and [facility].  Iva Boop, MD, [facility]  Discharge instructions given with verbal understanding. Handout on a dilatation diet. Resume previous medications. Increase omeprazole to 40 mg daily. YOU HAD AN ENDOSCOPIC PROCEDURE TODAY AT THE [facility]: Refer to the procedure report that was given to you for any specific questions about what was found during the examination.  If the procedure report does not answer your questions, please call your gastroenterologist to clarify.  If you requested that your care partner not be given the details of your procedure findings, then the procedure report has been included in a sealed envelope for you to review at your convenience later.  YOU SHOULD EXPECT: Some feelings of bloating in the abdomen. Passage of more gas than usual.  Walking can help get rid of the air that was put into your GI tract during the procedure and reduce the bloating. If you had a lower endoscopy (such as a colonoscopy or flexible sigmoidoscopy) you may notice spotting of blood in your stool or on the toilet paper. If you underwent a bowel prep for your procedure, then you may not have a normal bowel movement for a few days.  DIET: Your first meal following the procedure should be a light meal and then it is ok to progress to your normal diet.  A half-sandwich or bowl of soup is an example of a good first meal.  Heavy or fried foods are harder to digest and may make you feel nauseous or bloated.  Likewise meals  heavy in dairy and vegetables can cause extra gas to form and this can also increase the bloating.  Drink plenty of fluids but you should avoid alcoholic beverages for 24 hours.  ACTIVITY: Your care partner should take you home directly after the procedure.  You should plan to take it easy, moving slowly for the rest of the day.  You can resume normal activity the day after the procedure however you should NOT DRIVE or use heavy machinery for 24 hours (because of the sedation medicines used during the test).    SYMPTOMS TO REPORT IMMEDIATELY: A gastroenterologist can be reached at any hour.  During normal business hours, 8:30 AM to 5:00 PM Monday through Friday, call [phone].  After hours and on weekends, please call the GI answering service at [phone] who will take a message and have the physician on call contact you.   Following upper endoscopy (EGD)  Vomiting of blood or coffee ground material  New chest pain or pain under the shoulder blades  Painful or persistently difficult swallowing  New shortness of breath  Fever of 100F or higher  Black, tarry-looking stools  FOLLOW UP: If any biopsies were taken you will be contacted by phone or by letter within the next 1-3 weeks.  Call your gastroenterologist if you have not heard about the biopsies in 3 weeks.  Our staff will call the home number listed on your records the next business day following your procedure  to check on you and address any questions or concerns that you may have at that time regarding the information given to you following your procedure. This is a courtesy call and so if there is no answer at the home number and we have not heard from you through the emergency physician on call, we will assume that you have returned to your regular daily activities without incident.  SIGNATURES/CONFIDENTIALITY: You and/or your care partner have signed paperwork which will be entered into your electronic medical record.  These  signatures attest to the fact that that the information above on your After Visit Summary has been reviewed and is understood.  Full responsibility of the confidentiality of this discharge information lies with you and/or your care-partner.

## 2012-08-18 NOTE — Progress Notes (Signed)
Called to room to assist during endoscopic procedure.  Patient ID and intended procedure confirmed with present staff. Received instructions for my participation in the procedure from the performing physician.  

## 2012-08-18 NOTE — Progress Notes (Signed)
Patient did not experience any of the following events: a burn prior to discharge; a fall within the facility; wrong site/side/patient/procedure/implant event; or a hospital transfer or hospital admission upon discharge from the facility. (G8907) Patient did not have preoperative order for IV antibiotic SSI prophylaxis. (G8918)  

## 2012-08-21 ENCOUNTER — Telehealth: Payer: Self-pay | Admitting: *Deleted

## 2012-08-21 NOTE — Telephone Encounter (Signed)
  Follow up Call-  Call back number 08/18/2012  Post procedure Call Back phone  # [phone] or [phone]  Permission to leave phone message Yes     Patient questions:  Do you have a fever, pain , or abdominal swelling? no Pain Score  0 *  Have you tolerated food without any problems? yes  Have you been able to return to your normal activities? yes  Do you have any questions about your discharge instructions: Diet   no Medications  no Follow up visit  no  Do you have questions or concerns about your Care? no  Actions: * If pain score is 4 or above: No action needed, pain <4.  Patient c/o "sore throat over the weekend" but he states it was better today.

## 2012-08-23 NOTE — Progress Notes (Signed)
Quick Note:  Please call from office  bx results show inflammation only - probably from acid reflux  Stay on new dose omeprazole (40) Reduce caffeine See me in 2 months in office - call back at 1 month if still not swallowing better  [facility] - no letter or recall ______

## 2012-10-10 ENCOUNTER — Ambulatory Visit (INDEPENDENT_AMBULATORY_CARE_PROVIDER_SITE_OTHER): Payer: Medicare Other | Admitting: Internal Medicine

## 2012-10-10 ENCOUNTER — Encounter: Payer: Self-pay | Admitting: Internal Medicine

## 2012-10-10 VITALS — BP 126/76 | HR 85 | Ht 70.0 in | Wt 287.0 lb

## 2012-10-10 DIAGNOSIS — R1310 Dysphagia, unspecified: Secondary | ICD-10-CM

## 2012-10-10 DIAGNOSIS — R1314 Dysphagia, pharyngoesophageal phase: Secondary | ICD-10-CM

## 2012-10-10 DIAGNOSIS — E669 Obesity, unspecified: Secondary | ICD-10-CM

## 2012-10-10 NOTE — Progress Notes (Signed)
Subjective:    Patient ID: [name] [name], male    DOB: October 06, 1945, 67 y.o.   MRN: [id number]  HPI The patient is here w/ wife after he had EGD and dilation 07/2012. Erosive esophagitis c/w GERD. Doing well w/o dysphagia. Has not reduced Pepsi, is starting to try to reduce weight but not much effort yet.  Allergies  Allergen Reactions  . Penicillins Hives and Other (See Comments)    "I break out in welts"  . Codeine Nausea And Vomiting  . Sulfonamide Derivatives Nausea And Vomiting   Outpatient Prescriptions Prior to Visit  Medication Sig Dispense Refill  . aspirin 81 MG tablet Take 2 tabs daily      . citalopram (CELEXA) 20 MG tablet Take 20 mg by mouth daily.      . hydrochlorothiazide (HYDRODIURIL) 25 MG tablet Take 25 mg by mouth daily.       [name] levothyroxine (SYNTHROID, LEVOTHROID) 200 MCG tablet Take 175 mcg by mouth daily. Take one tablet daily 5 days a week      . loratadine (CLARITIN) 10 MG tablet Take 10 mg by mouth daily.      [name] losartan (COZAAR) 50 MG tablet Take 50 mg by mouth daily.      [name] omeprazole (PRILOSEC) 40 MG capsule Take 1 capsule (40 mg total) by mouth daily before breakfast. 30 minutes before  90 capsule  3  . polyethylene glycol (MIRALAX) packet Take 17 g by mouth daily.        . simvastatin (ZOCOR) 20 MG tablet Take 1 tablet (20 mg total) by mouth at bedtime.  90 tablet  3  . Testosterone (ANDROGEL PUMP) 20.25 MG/ACT (1.62%) GEL Place 81 mg onto the skin daily. 6 pumps per day= 81mg       . Trospium Chloride 60 MG CP24 Take 60 mg by mouth daily.       No facility-administered medications prior to visit.   Past Medical History  Diagnosis Date  . Observed sleep apnea     can't wear cpap  . HTN (hypertension)   . GERD (gastroesophageal reflux disease)   . Dyspnea     -PFTs compeltely normal 03/20/08 including DLc0  . Chest pain   . Sleep apnea     Most recent sleep study 2010; records at [facility] office  . CAD (coronary artery disease)     cath 3/10:  mLAD 30%, pRCA 30%, EF 55-60% Dr. Tenny Craw cardiologist  most recent  stress stress done ~ 2 years ago with Dr. Tenny Craw  . Hypothyroidism   . Low testosterone   . Arthritis   . Depression   . Polycythemia   . PONV (postoperative nausea and vomiting)   . Complication of anesthesia     "he usually gets an ileus after back OR"  . Pulmonary nodule 06/13/2012  . Rosacea    Past Surgical History  Procedure Laterality Date  . Cataract extraction, bilateral  2010  . Meniscus repair  08/2008    left  . Tonsillectomy  09/21/11  . Uvulopalatopharyngoplasty, tonsillectomy and septoplasty  09/21/11  . Posterior fusion lumbar spine  2005; 01/2007; 04/2009    L5; L3-4; L2-3  . Lumbar disc surgery  06/2006    L3  . Back surgery  2005; 06/2006; 01/2007; 04/2009  . Prostate surgery  2000  . Transurethral resection of prostate  2006    followed by "surgery to get rid of clots"  . Shoulder open rotator cuff repair  left  . Cervical fusion  04/2002    ?C3-4  . Total knee arthroplasty  01/2011    left  . Cardiac catheterization  08/2008  . Esophagogastroduodenoscopy  multiple    last 07/2012 GERD esophagitis, 48 Fr dilation  . Colonoscopy     Review of Systems As above    Objective:   Physical Exam Obese, NAD    Assessment & Plan:  Reflux esophagitis-improved/controlled  Esophageal dysphagia-resolved  Obesity-no change  1. Stay on long-term PPI - omeprazole 40 mg now. Possible bone loss issues discussed, I believe benefits>risks. 2. Try to reduce caffeine, sodas and lose weight. Caloric reduction discussed. 3. See me as needed, PCP should be able to refill PPI - I can do up to 2 years if no problems ongoing, if needed.  EX:BMWUX,LKGMW RUTH, MD

## 2012-10-10 NOTE — Patient Instructions (Addendum)
Stay on your omeprazole and you should be able to get it refilled thru your PCP.  Continue your weight loss efforts and try to cut back on your caffeine consumption.  Follow up with [name] as needed.  Thank you for choosing me and [facility].  Iva Boop, M.D., [facility]

## 2012-10-30 ENCOUNTER — Ambulatory Visit: Payer: Medicare Other | Attending: Family Medicine

## 2012-10-30 DIAGNOSIS — IMO0001 Reserved for inherently not codable concepts without codable children: Secondary | ICD-10-CM | POA: Insufficient documentation

## 2012-10-30 DIAGNOSIS — R269 Unspecified abnormalities of gait and mobility: Secondary | ICD-10-CM | POA: Insufficient documentation

## 2012-10-30 DIAGNOSIS — Z96659 Presence of unspecified artificial knee joint: Secondary | ICD-10-CM | POA: Insufficient documentation

## 2012-10-30 DIAGNOSIS — M6281 Muscle weakness (generalized): Secondary | ICD-10-CM | POA: Insufficient documentation

## 2012-10-30 DIAGNOSIS — R262 Difficulty in walking, not elsewhere classified: Secondary | ICD-10-CM | POA: Insufficient documentation

## 2012-10-30 DIAGNOSIS — M25569 Pain in unspecified knee: Secondary | ICD-10-CM | POA: Insufficient documentation

## 2012-11-01 ENCOUNTER — Ambulatory Visit: Payer: Medicare Other | Admitting: Physical Therapy

## 2012-11-07 ENCOUNTER — Ambulatory Visit: Payer: Medicare Other

## 2012-11-09 ENCOUNTER — Ambulatory Visit: Payer: Medicare Other

## 2012-11-13 ENCOUNTER — Ambulatory Visit: Payer: Medicare Other

## 2012-11-15 ENCOUNTER — Encounter: Payer: Medicare Other | Admitting: Physical Therapy

## 2012-12-12 ENCOUNTER — Ambulatory Visit (HOSPITAL_COMMUNITY)
Admission: RE | Admit: 2012-12-12 | Discharge: 2012-12-12 | Disposition: A | Discharge status: Home / Self Care | Payer: Medicare Other | Source: Ambulatory Visit | Attending: Oncology | Admitting: Oncology

## 2012-12-12 ENCOUNTER — Other Ambulatory Visit (HOSPITAL_BASED_OUTPATIENT_CLINIC_OR_DEPARTMENT_OTHER): Payer: Medicare Other | Admitting: Lab

## 2012-12-12 DIAGNOSIS — R918 Other nonspecific abnormal finding of lung field: Secondary | ICD-10-CM | POA: Insufficient documentation

## 2012-12-12 DIAGNOSIS — K7689 Other specified diseases of liver: Secondary | ICD-10-CM | POA: Insufficient documentation

## 2012-12-12 DIAGNOSIS — G4733 Obstructive sleep apnea (adult) (pediatric): Secondary | ICD-10-CM

## 2012-12-12 DIAGNOSIS — R911 Solitary pulmonary nodule: Secondary | ICD-10-CM

## 2012-12-12 DIAGNOSIS — D751 Secondary polycythemia: Secondary | ICD-10-CM

## 2012-12-12 LAB — COMPREHENSIVE METABOLIC PANEL (CC13)
Albumin: 3.4 g/dL — ABNORMAL LOW (ref 3.5–5.0)
Alkaline Phosphatase: 74 U/L (ref 40–150)
BUN: 12.3 mg/dL (ref 7.0–26.0)
Calcium: 9.2 mg/dL (ref 8.4–10.4)
Creatinine: 0.8 mg/dL (ref 0.7–1.3)
Glucose: 109 mg/dl — ABNORMAL HIGH (ref 70–99)
Potassium: 3.9 mEq/L (ref 3.5–5.1)

## 2012-12-12 LAB — CBC WITH DIFFERENTIAL/PLATELET
Basophils Absolute: 0 10*3/uL (ref 0.0–0.1)
EOS%: 2.9 % (ref 0.0–7.0)
HCT: 45.1 % (ref 38.4–49.9)
HGB: 15 g/dL (ref 13.0–17.1)
LYMPH%: 26.5 % (ref 14.0–49.0)
MCH: 30.5 pg (ref 27.2–33.4)
MCV: 91.7 fL (ref 79.3–98.0)
NEUT%: 60.1 % (ref 39.0–75.0)
Platelets: 222 10*3/uL (ref 140–400)
lymph#: 1.3 10*3/uL (ref 0.9–3.3)

## 2012-12-12 MED ORDER — IOHEXOL 300 MG/ML  SOLN
100.0000 mL | Freq: Once | INTRAMUSCULAR | Status: AC | PRN
  Administered 2012-12-12: 100 mL via INTRAVENOUS

## 2012-12-14 ENCOUNTER — Ambulatory Visit (HOSPITAL_BASED_OUTPATIENT_CLINIC_OR_DEPARTMENT_OTHER): Payer: Medicare Other | Admitting: Oncology

## 2012-12-14 ENCOUNTER — Telehealth: Payer: Self-pay | Admitting: Oncology

## 2012-12-14 VITALS — BP 95/56 | HR 81 | Temp 98.4°F | Resp 18 | Ht 70.0 in | Wt 281.8 lb

## 2012-12-14 DIAGNOSIS — R911 Solitary pulmonary nodule: Secondary | ICD-10-CM

## 2012-12-14 NOTE — Telephone Encounter (Signed)
gv adn printed appt sched and avs for pt °

## 2012-12-14 NOTE — Progress Notes (Signed)
Hematology and Oncology Follow Up Visit  [name] [name] [id number] 1946/06/02 67 y.o. 12/14/2012 9:43 AM  CC: [name] [name], M.D.    Principle Diagnosis:This is a 67 year old gentleman with polycythemia, initially seen in March 2012, etiology most likely reactive due to obstructive sleep apnea and androgen supplements.  Current therapy: Observation and Surveillance.   Interim History:  [name] presents today for a followup visit.  He is a pleasant 67 year old gentleman initially seen in March 2012 for evaluation for polycythemia.  His hemoglobin had ranged between 16 and 17 and up as high as 18 in December 2011.  He had normal white cell count and normal platelets.  He has had a lot of reasons to possibly be causing his erythrocytosis.  Causes of possible polycythemia would include obstructive sleep apnea as well as androgen supplements.  He has had really prototypical obstructive sleep apnea symptoms that include apneic episodes, snoring, he has really a thick neck and thick tonsils. He had tonsill surgery with improvement of symptoms. He had not had any major complications related to that, had not had any thrombotic events.  Did not report any bleeding complications.  Overall, performance status has been limited due to osteoarthritis of the knees. The patient had a recent CT angiogram of his back. There were small pulmonary nodules noted. He has no hemoptysis and SOB. No new symptoms noted since the last visit.   Medications: I have reviewed the patient's current medications.  Current Outpatient Prescriptions  Medication Sig Dispense Refill  . aspirin 81 MG tablet Take 2 tabs daily      . citalopram (CELEXA) 20 MG tablet Take 20 mg by mouth daily.      . hydrochlorothiazide (HYDRODIURIL) 25 MG tablet Take 25 mg by mouth daily.       [name] levothyroxine (SYNTHROID, LEVOTHROID) 125 MCG tablet Take 125 mcg by mouth daily before breakfast.      . losartan (COZAAR) 50 MG tablet Take 50 mg by mouth  daily.      [name] omeprazole (PRILOSEC) 40 MG capsule Take 1 capsule (40 mg total) by mouth daily before breakfast. 30 minutes before  90 capsule  3  . polyethylene glycol (MIRALAX) packet Take 17 g by mouth daily.        . simvastatin (ZOCOR) 20 MG tablet Take 1 tablet (20 mg total) by mouth at bedtime.  90 tablet  3  . Testosterone (ANDROGEL PUMP) 20.25 MG/ACT (1.62%) GEL Place 81 mg onto the skin daily. 6 pumps per day= 81mg       . Trospium Chloride 60 MG CP24 Take 60 mg by mouth daily.       No current facility-administered medications for this visit.    Allergies:  Allergies  Allergen Reactions  . Penicillins Hives and Other (See Comments)    "I break out in welts"  . Codeine Nausea And Vomiting  . Sulfonamide Derivatives Nausea And Vomiting    Past Medical History, Surgical history, Social history, and Family History were reviewed and updated.  Review of Systems: Constitutional:  Negative for fever, chills, night sweats, anorexia, weight loss, pain. Cardiovascular: no chest pain or dyspnea on exertion Respiratory: no cough, shortness of breath, or wheezing Neurological: no TIA or stroke symptoms Dermatological: negative ENT: negative Skin: Negative. Gastrointestinal: no abdominal pain, change in bowel habits, or black or bloody stools Genito-Urinary: no dysuria, trouble voiding, or hematuria Hematological and Lymphatic: negative Breast: negative Musculoskeletal: negative Remaining ROS negative.  Physical Exam: Blood pressure 95/56,  pulse 81, temperature 98.4 F (36.9 C), temperature source Oral, resp. rate 18, height 5\' 10"  (1.778 m), weight 281 lb 12.8 oz (127.824 kg), SpO2 97.00%. ECOG: 1 General appearance: alert Head: Normocephalic, without obvious abnormality, atraumatic Neck: no adenopathy, no carotid bruit, no JVD, supple, symmetrical, trachea midline and thyroid not enlarged, symmetric, no tenderness/mass/nodules Lymph nodes: Cervical, supraclavicular, and  axillary nodes normal. Heart:regular rate and rhythm, S1, S2 normal, no murmur, click, rub or gallop Lung:chest clear, no wheezing, rales, normal symmetric air entry Abdomen: soft, non-tender, without masses or organomegaly EXT:no erythema, induration, or nodules   Lab Results: Lab Results  Component Value Date   WBC 4.8 12/12/2012   HGB 15.0 12/12/2012   HCT 45.1 12/12/2012   MCV 91.7 12/12/2012   PLT 222 12/12/2012     Chemistry      Component Value Date/Time   NA 137 12/12/2012 0752   NA 136 09/17/2011 0902   K 3.9 12/12/2012 0752   K 4.7 09/17/2011 0902   CL 102 12/12/2012 0752   CL 97 09/17/2011 0902   CO2 26 12/12/2012 0752   CO2 29 09/17/2011 0902   BUN 12.3 12/12/2012 0752   BUN 15 09/17/2011 0902   CREATININE 0.8 12/12/2012 0752   CREATININE 0.80 09/17/2011 0902      Component Value Date/Time   CALCIUM 9.2 12/12/2012 0752   CALCIUM 10.2 09/17/2011 0902   ALKPHOS 74 12/12/2012 0752   ALKPHOS 71 02/10/2011 0939   AST 20 12/12/2012 0752   AST 25 02/10/2011 0939   ALT 23 12/12/2012 0752   ALT 27 02/10/2011 0939   BILITOT 0.60 12/12/2012 0752   BILITOT 0.5 02/10/2011 0939     CT CHEST WITH CONTRAST  Technique: Multidetector CT imaging of the chest was performed  following the standard protocol during bolus administration of  intravenous contrast.  Contrast: OMNIPAQUE IOHEXOL 300 MG/ML SOLN  Comparison: CT thoracic spine dated 05/30/2012  Findings: Multiple small bilateral pulmonary nodules, many of which  are subpleural in location. For example:  --4 mm nodule along the right minor fissure (series 5/image 29)  --6 mm nodule in the right lower lobe (series 5/image 36),  unchanged  --5 mm nodule along the right lung base (series 5/image 43)  Calcified pleural plaques. No pleural effusion or pneumothorax.  Minimal nodularity along the right lateral aspect of the trachea  (series 5/image 14), possibly reflecting secretions/debris.  Visualized thyroid is unremarkable.  The heart  is normal in size. No pericardial effusion.  No mediastinal, hilar, or axillary lymphadenopathy.  Visualized upper abdomen is notable for mild hepatic steatosis.  Mild degenerative changes of the visualized thoracolumbar spine.  IMPRESSION:  Multiple small bilateral pulmonary nodules, as described above.  68-month stability has been demonstrated for the dominant 6 mm right  lower lobe nodule. Follow-up CT chest is suggested in 12 months  (18-24 months from original CT).  Calcified pleural plaques, suggesting asbestos related pleural  disease.  This recommendation follows the consensus statement: Guidelines for  Management of Small Pulmonary Nodules Detected on CT Scans: A  Statement from the Fleischner Society as published in Radiology  2005; 237:395-400.   Impression and Plan: This is a pleasant 67 year old gentleman with the following issues: 1. 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 This has normalized since his tonsil surgery. No further work up needed at this time.  2. Thrombosis prophylaxis. On low dose ASA. 3. Pulmonary nodules.  Pt has a history of smoking 1.5-2 ppd for 18 years. CT scan discussed today and show stable nodule. The plan is to repeat in 12 months, if continues to be stable in 12 months, no further follow up will be needed.     Lacrystal Barbe 6/19/20149:43 AM

## 2013-02-08 ENCOUNTER — Other Ambulatory Visit: Payer: Self-pay | Admitting: *Deleted

## 2013-02-08 MED ORDER — SIMVASTATIN 20 MG PO TABS: 20.0000 mg | ORAL_TABLET | Freq: Every day | ORAL | Status: DC

## 2013-03-14 ENCOUNTER — Ambulatory Visit (INDEPENDENT_AMBULATORY_CARE_PROVIDER_SITE_OTHER): Payer: Medicare Other | Admitting: Neurology

## 2013-03-14 ENCOUNTER — Encounter: Payer: Self-pay | Admitting: Neurology

## 2013-03-14 VITALS — BP 126/68 | HR 83 | Temp 98.5°F | Ht 70.0 in | Wt 287.0 lb

## 2013-03-14 DIAGNOSIS — R269 Unspecified abnormalities of gait and mobility: Secondary | ICD-10-CM

## 2013-03-14 NOTE — Patient Instructions (Addendum)
I encouraged the patient to use a walker for long distances preferably one with a seat that he can rest when tired. Check MRI scan of the brain and neck with and without contrast. I offered referral to physical therapy but the patient refused since it has not helped him in the past. Return for followup in 6 weeks.

## 2013-03-14 NOTE — Progress Notes (Signed)
[facility] [address] [place]. [place] [address] [phone]       OFFICE CONSULT NOTE  [name]. [name] [name] Date of Birth:  Jul 25, 1945 Medical Record Number:  [id number]   Referring MD:  Shaune Pollack, MD  Reason for Referral:  neuropathy  HPI: [name] [name] is a 64 year Caucasian male who is had chronic low back pain and gait difficulties for several years. He states his symptoms began in 2005 since his first back surgery. Over the years he subsequently had 4 other back surgeries and since then he also had left knee surgery and noticed that his balance is poor and his left leg is weak, he cannot trust his left leg as  it often gives out. He cannot walk for long distances and often needs to hold on. He has been advised to use a cane and walker but does not use them consistently. He was in fact evaluated by me in July 2013 and thought to have multifactorial gait disorder clips exam showing some weakness and sensory loss. No collection the history none by Dr. Terrace Arabia on 01/10/12 showed no evidence of peripheral neuropathy but did show changes of chronic  right L4 radiculopathy. I had advised MRI scan of the brain but for unclear reason that was not done and the patient did not keep followup appointments with me. He states that he has noticed increasing gait and balance problems  of late. He has had chronic back pain but denies any recent increase or radicular pain. He does have some pain and numbness in his left foot which is also chronic and not change recently. He denies any symptoms suggestive of her stroke sudden weakness numbness. He was referred to physical therapy in May this year by Dr. Kevan Ny but went only for 2 weeks and stopped going due to lack of benefit. He does also complain of some intermittent left upper extremity numbness off and on even while driving particularly when he raises his left arm above his head. He has had previous neck surgery but has not had any imaging studies in  recent years.    ROS:   14 system review of systems is positive for weight gain, leg swelling, hearing loss, spinning sensation, trouble swallowing, itching, nose, rash, shortness of breath, cough, wheezing, snoring, constipation, importance, increased thirst, joint pain, allergies, numbness, weakness, difficulty swallowing, dizziness, depression, not enough sleep, decreased energy, disinterest in activities snoring.  PMH:  Past Medical History  Diagnosis Date  . Observed sleep apnea     can't wear cpap  . HTN (hypertension)   . GERD (gastroesophageal reflux disease)   . Dyspnea     -PFTs compeltely normal 03/20/08 including DLc0  . Chest pain   . Sleep apnea     Most recent sleep study 2010; records at [facility] office  . CAD (coronary artery disease)     cath 3/10: mLAD 30%, pRCA 30%, EF 55-60% Dr. Tenny Craw cardiologist  most recent  stress stress done ~ 2 years ago with Dr. Tenny Craw  . Hypothyroidism   . Low testosterone   . Arthritis   . Depression   . Polycythemia   . PONV (postoperative nausea and vomiting)   . Complication of anesthesia     "he usually gets an ileus after back OR"  . Pulmonary nodule 06/13/2012  . Rosacea     Social History:  History   Social History  . Marital Status: Married    Spouse Name: N/A  Number of Children: 3  . Years of Education: N/A   Occupational History  . retired    Social History Main Topics  . Smoking status: Former Smoker -- 1.50 packs/day for 18 years    Types: Cigarettes    Quit date: 06/28/1974  . Smokeless tobacco: Never Used  . Alcohol Use: No  . Drug Use: No  . Sexual Activity: No   Other Topics Concern  . Not on file   Social History Narrative   Area, 3 children.   Retired from the city of [place]   5 caffeinated beverages daily    Medications:   Current Outpatient Prescriptions on File Prior to Visit  Medication Sig Dispense Refill  . aspirin 81 MG tablet Take 2 tabs daily      . citalopram (CELEXA) 20  MG tablet Take 20 mg by mouth daily.      . hydrochlorothiazide (HYDRODIURIL) 25 MG tablet Take 25 mg by mouth daily.       [name] levothyroxine (SYNTHROID, LEVOTHROID) 125 MCG tablet Take 125 mcg by mouth daily before breakfast.      . losartan (COZAAR) 50 MG tablet Take 50 mg by mouth daily.      [name] omeprazole (PRILOSEC) 40 MG capsule Take 1 capsule (40 mg total) by mouth daily before breakfast. 30 minutes before  90 capsule  3  . polyethylene glycol (MIRALAX) packet Take 17 g by mouth daily.        . simvastatin (ZOCOR) 20 MG tablet Take 1 tablet (20 mg total) by mouth at bedtime.  90 tablet  3  . Testosterone (ANDROGEL PUMP) 20.25 MG/ACT (1.62%) GEL Place 81 mg onto the skin daily. 6 pumps per day= 81mg       . Trospium Chloride 60 MG CP24 Take 60 mg by mouth daily.       No current facility-administered medications on file prior to visit.    Allergies:   Allergies  Allergen Reactions  . Penicillins Hives and Other (See Comments)    "I break out in welts"  . Codeine Nausea And Vomiting  . Sulfonamide Derivatives Nausea And Vomiting    Physical Exam General: obese middle aged Caucasian male, seated, in no evident distress Head: head normocephalic and atraumatic. Orohparynx benign Neck: supple with no carotid or supraclavicular bruits Cardiovascular: regular rate and rhythm, no murmurs Musculoskeletal: no deformity Skin:  no surgical scar left knee and midline low back Vascular:  Normal pulses all extremities Filed Vitals:   03/14/13 1351  BP: 126/68  Pulse: 83  Temp: 98.5 F (36.9 C)    Neurologic Exam Mental Status: Awake and fully alert. Oriented to place and time. Recent and remote memory intact. Attention span, concentration and fund of knowledge appropriate. Mood and affect appropriate.  Cranial Nerves: Fundoscopic exam reveals sharp disc margins. Pupils equal, briskly reactive to light. Extraocular movements full without nystagmus. Visual fields full to confrontation.  Hearing intact. Facial sensation intact. Face, tongue, palate moves normally and symmetrically.  Motor: Normal bulk and tone. Normal strength in all tested extremity muscles. Except mild weakness of the left shoulder abduction and elbow extension. Mild weakness of left hip flexors and ankle dorsiflexors.  Sensory.: diminished touch and pinprick and vibratory sensation left foot only..  Coordination: Rapid alternating movements normal in all extremities. Finger-to-nose and heel-to-shin performed accurately bilaterally. Gait and Station: Arises from chair with difficulty. Stance is  Broad based. Gait demonstrates flaring of the left knee and some splinting of his back. Unable  to tandem walking unsteady when standing on either foot unsupported, heel or toes  Reflexes: 2+ and symmetric in upper extremities left knee right ankle jerk are depressed. Toes downgoing.    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     PLAN: I encouraged the patient to use a walker for long distances preferably one with a seat that he can rest when tired. Check MRI scan of the brain for cerebrovascular and MRI neck with and without contrast for compressive myelopathy given his brisk reflexes. I offered referral to physical therapy but the patient refused since it has not helped him in the past. Return for followup in 6 weeks.

## 2013-04-13 ENCOUNTER — Ambulatory Visit
Admission: RE | Admit: 2013-04-13 | Discharge: 2013-04-13 | Disposition: A | Discharge status: Home / Self Care | Payer: Medicare Other | Source: Ambulatory Visit | Attending: Neurology | Admitting: Neurology

## 2013-04-13 DIAGNOSIS — R269 Unspecified abnormalities of gait and mobility: Secondary | ICD-10-CM

## 2013-04-13 MED ORDER — GADOBENATE DIMEGLUMINE 529 MG/ML IV SOLN
20.0000 mL | Freq: Once | INTRAVENOUS | Status: AC | PRN
  Administered 2013-04-13: 20 mL via INTRAVENOUS

## 2013-04-17 NOTE — Progress Notes (Signed)
Quick Note:  I called and relayed the MRI brain and C spine results to wife. She would relay to pt. Pt had 2 appts (11-10- and 11-4) I cancelled the 11/10 appt. Wife verbalized understanding of results and appt. ______

## 2013-05-01 ENCOUNTER — Encounter: Payer: Self-pay | Admitting: Neurology

## 2013-05-01 ENCOUNTER — Ambulatory Visit (INDEPENDENT_AMBULATORY_CARE_PROVIDER_SITE_OTHER): Payer: Medicare Other | Admitting: Neurology

## 2013-05-01 VITALS — BP 110/66 | HR 78 | Temp 98.2°F | Ht 70.0 in | Wt 288.0 lb

## 2013-05-01 DIAGNOSIS — R269 Unspecified abnormalities of gait and mobility: Secondary | ICD-10-CM

## 2013-05-01 NOTE — Progress Notes (Signed)
[facility] [address] [place]. [place] [address] [phone]       OFFICE FOLLOW UP NOTE  [name] Date of Birth:  [birth year] Medical Record Number:  [id number]   Referring MD:  Shaune Pollack, MD  Reason for Referral:  neuropathy  HPI: [name] is a 67 year Caucasian male who is had chronic low back pain and gait difficulties for several years. He states his symptoms began in 2005 since his first back surgery. Over the years he subsequently had 4 other back surgeries and since then he also had left knee surgery and noticed that his balance is poor and his left leg is weak, he cannot trust his left leg as  it often gives out. He cannot walk for long distances and often needs to hold on. He has been advised to use a cane and walker but does not use them consistently. He was in fact evaluated by me in July 2013 and thought to have multifactorial gait disorder clips exam showing some weakness and sensory loss. No collection the history none by Dr. Terrace Arabia on 01/10/12 showed no evidence of peripheral neuropathy but did show changes of chronic  right L4 radiculopathy. I had advised MRI scan of the brain but for unclear reason that was not done and the patient did not keep followup appointments with me. He states that he has noticed increasing gait and balance problems  of late. He has had chronic back pain but denies any recent increase or radicular pain. He does have some pain and numbness in his left foot which is also chronic and not change recently. He denies any symptoms suggestive of her stroke sudden weakness numbness. He was referred to physical therapy in May this year by Dr. Kevan Ny but went only for 2 weeks and stopped going due to lack of benefit. He does also complain of some intermittent left upper extremity numbness off and on even while driving particularly when he raises his left arm above his head. He has had previous neck surgery but has not had any imaging studies  in recent years.   Update 05/01/13 He returns for followup of her wrist last visit with me on 03/14/13. He had a fall on [place] 1 [place] when [place] his neck to one side. Fortunately did not sustain major injuries. He does complain that his neck is tight and he feels as if his head head is in a bucket hand with a lot of pressure on his temples and ears. He continues to have gait and balance difficulties which are unchanged. He did undergo MRI scan of the brain on 04/13/13 which showed mild changes of chronic microvascular ischemia and generalized cerebral atrophy. MRI scan of the cervical spine showed stable postoperative changes of C3-C4 anterior cervical fusion and prominent spondylitic changes from C4-C7 showing mild, and asymmetric right greater than left foraminal narrowing. ROS:   14 system review of systems is positive for weight gain, leg swelling, hearing loss, spinning sensation, trouble swallowing, itching, nose, rash, shortness of breath, cough, wheezing, snoring, constipation, importance, increased thirst, joint pain, allergies, numbness, weakness, difficulty swallowing, dizziness, depression, not enough sleep, decreased energy, disinterest in activities snoring.  PMH:  Past Medical History  Diagnosis Date  . Observed sleep apnea     can't wear cpap  . HTN (hypertension)   . GERD (gastroesophageal reflux disease)   . Dyspnea     -PFTs compeltely normal 03/20/08 including DLc0  .  Chest pain   . Sleep apnea     Most recent sleep study 2010; records at [facility] office  . CAD (coronary artery disease)     cath 3/10: mLAD 30%, pRCA 30%, EF 55-60% Dr. Tenny Craw cardiologist  most recent  stress stress done ~ 2 years ago with Dr. Tenny Craw  . Hypothyroidism   . Low testosterone   . Arthritis   . Depression   . Polycythemia   . PONV (postoperative nausea and vomiting)   . Complication of anesthesia     "he usually gets an ileus after back OR"  . Pulmonary nodule  06/13/2012  . Rosacea     Social History:  History   Social History  . Marital Status: Married    Spouse Name: N/A    Number of Children: 3  . Years of Education: N/A   Occupational History  . retired    Social History Main Topics  . Smoking status: Former Smoker -- 1.50 packs/day for 18 years    Types: Cigarettes    Quit date: 06/28/1974  . Smokeless tobacco: Never Used  . Alcohol Use: No  . Drug Use: No  . Sexual Activity: No   Other Topics Concern  . Not on file   Social History Narrative   Area, 3 children.   Retired from the city of [place]   5 caffeinated beverages daily    Medications:   Current Outpatient Prescriptions on File Prior to Visit  Medication Sig Dispense Refill  . aspirin 81 MG tablet Take 2 tabs daily      . citalopram (CELEXA) 20 MG tablet Take 20 mg by mouth daily.      . hydrochlorothiazide (HYDRODIURIL) 25 MG tablet Take 25 mg by mouth daily.       [name] levothyroxine (SYNTHROID, LEVOTHROID) 125 MCG tablet Take 125 mcg by mouth daily before breakfast.      . losartan (COZAAR) 50 MG tablet Take 50 mg by mouth daily.      [name] omeprazole (PRILOSEC) 40 MG capsule Take 1 capsule (40 mg total) by mouth daily before breakfast. 30 minutes before  90 capsule  3  . polyethylene glycol (MIRALAX) packet Take 17 g by mouth daily.        . simvastatin (ZOCOR) 20 MG tablet Take 1 tablet (20 mg total) by mouth at bedtime.  90 tablet  3  . Trospium Chloride 60 MG CP24 Take 60 mg by mouth daily.       No current facility-administered medications on file prior to visit.    Allergies:   Allergies  Allergen Reactions  . Penicillins Hives and Other (See Comments)    "I break out in welts"  . Codeine Nausea And Vomiting  . Sulfonamide Derivatives Nausea And Vomiting    Physical Exam General: obese 67 year Caucasian male, seated, in no evident distress Head: head normocephalic and atraumatic. Orohparynx benign Neck: supple with no carotid or  supraclavicular bruits Cardiovascular: regular rate and rhythm, no murmurs Musculoskeletal: no deformity. Painful left knee Skin:   surgical scar left knee and midline low back Vascular:  Normal pulses all extremities Filed Vitals:   05/01/13 1352  BP: 110/66  Pulse: 78  Temp: 98.2 F (36.8 C)    Neurologic Exam Mental Status: Awake and fully alert. Oriented to place and time. Recent and remote memory intact. Attention span, concentration and fund of knowledge appropriate. Mood and affect appropriate.  Cranial Nerves: Fundoscopic exam not done . Pupils equal,  briskly reactive to light. Extraocular movements full without nystagmus. Visual fields full to confrontation. Hearing intact. Facial sensation intact. Face, tongue, palate moves normally and symmetrically.  Motor: Normal bulk and tone. Normal strength in all tested extremity muscles. Except mild weakness of the left shoulder abduction and elbow extension. Mild weakness of left hip flexors and ankle dorsiflexors.  Sensory.: diminished touch and pinprick and vibratory sensation left foot only..  Coordination: Rapid alternating movements normal in all extremities. Finger-to-nose and heel-to-shin performed accurately bilaterally. Gait and Station: Arises from chair with difficulty. Stance is  Broad based. Gait demonstrates favoring of the left knee and some splinting of his back. Unable to tandem walking unsteady when standing on either foot unsupported, heel or toes  Reflexes: 2+ and symmetric in upper extremities left knee right ankle jerk are depressed. Toes downgoing.    ASSESSMENT: 67 year old Caucasian male with chronic gait difficulties and left leg weakness likely multifactorial etiology from combination of degenerative spine  & knee disease and small vessely cerebrovascular disease.    PLAN: I had a long discussion the patient and his wife Re: the results of his imaging findings, my exam and answered questions. I advised him to  get up slowly and avoid sudden movements and to use an assist device at all times while walking.I encouraged the patient to use a walker for long distances preferably one with a seat that he can rest when tired. Refer to physical therapy for gait and balance. Use wheeled walker with a seat for ambulation and given a prescription for the same. F/u in 3 months or as needed only

## 2013-05-01 NOTE — Patient Instructions (Signed)
Refer to physical therapy for gait and balance. Use wheeled walker with a seat for ambulation and given a prescription for the same. F/u in 3 months or as needed only

## 2013-05-04 ENCOUNTER — Ambulatory Visit: Payer: Medicare Other | Attending: Neurology

## 2013-05-04 DIAGNOSIS — R279 Unspecified lack of coordination: Secondary | ICD-10-CM | POA: Insufficient documentation

## 2013-05-04 DIAGNOSIS — IMO0001 Reserved for inherently not codable concepts without codable children: Secondary | ICD-10-CM | POA: Insufficient documentation

## 2013-05-04 DIAGNOSIS — R262 Difficulty in walking, not elsewhere classified: Secondary | ICD-10-CM | POA: Insufficient documentation

## 2013-05-07 ENCOUNTER — Ambulatory Visit: Payer: Medicare Other | Admitting: Neurology

## 2013-05-07 ENCOUNTER — Ambulatory Visit: Payer: Medicare Other

## 2013-05-09 ENCOUNTER — Ambulatory Visit: Payer: Medicare Other

## 2013-05-14 ENCOUNTER — Ambulatory Visit: Payer: Medicare Other

## 2013-05-16 ENCOUNTER — Ambulatory Visit: Payer: Medicare Other

## 2013-05-21 ENCOUNTER — Ambulatory Visit: Payer: Medicare Other

## 2013-05-22 ENCOUNTER — Ambulatory Visit: Payer: Medicare Other

## 2013-05-23 ENCOUNTER — Ambulatory Visit: Payer: Medicare Other

## 2013-05-28 ENCOUNTER — Ambulatory Visit: Payer: Medicare Other | Attending: Neurology | Admitting: Physical Therapy

## 2013-05-28 DIAGNOSIS — R262 Difficulty in walking, not elsewhere classified: Secondary | ICD-10-CM | POA: Insufficient documentation

## 2013-05-28 DIAGNOSIS — R279 Unspecified lack of coordination: Secondary | ICD-10-CM | POA: Insufficient documentation

## 2013-05-28 DIAGNOSIS — IMO0001 Reserved for inherently not codable concepts without codable children: Secondary | ICD-10-CM | POA: Insufficient documentation

## 2013-05-30 ENCOUNTER — Ambulatory Visit: Payer: Medicare Other | Admitting: Physical Therapy

## 2013-06-04 ENCOUNTER — Ambulatory Visit: Payer: Medicare Other

## 2013-06-06 ENCOUNTER — Ambulatory Visit: Payer: Medicare Other | Admitting: Physical Therapy

## 2013-06-11 ENCOUNTER — Ambulatory Visit: Payer: Medicare Other | Admitting: Physical Therapy

## 2013-06-13 ENCOUNTER — Ambulatory Visit: Payer: Medicare Other

## 2013-06-18 ENCOUNTER — Ambulatory Visit (INDEPENDENT_AMBULATORY_CARE_PROVIDER_SITE_OTHER): Payer: Medicare Other | Admitting: Internal Medicine

## 2013-06-18 ENCOUNTER — Ambulatory Visit: Payer: Medicare Other

## 2013-06-18 ENCOUNTER — Encounter: Payer: Self-pay | Admitting: Internal Medicine

## 2013-06-18 VITALS — BP 123/78 | HR 84 | Ht 70.0 in | Wt 291.8 lb

## 2013-06-18 DIAGNOSIS — I2510 Atherosclerotic heart disease of native coronary artery without angina pectoris: Secondary | ICD-10-CM

## 2013-06-18 NOTE — Patient Instructions (Signed)
Your physician wants you to follow-up in: ONE YEAR WITH DR Filbert Schilder will receive a reminder letter in the mail two months in advance. If you don't receive a letter, please call our office to schedule the follow-up appointment.   REFERRAL TO NUTRITION FOR WEIGHT LOSS

## 2013-06-18 NOTE — Progress Notes (Signed)
HPI Patient is a 67 year old with a history of nonobstructive CAD by cardiac catheterization in 3/10. At that time he had a mid LAD 30% and proximal RCA 30% with an EF of 55-60%. Myoview prior to his cardiac catheterization 2/10 demonstrated inferior scar with mild to moderate peri-infarct ischemia. Echocardiogram 3/03 demonstrated normal LV function. His other medical history includes hypertension, hyperlipidemia, hypothyroidism, depression, testosterone deficiency, obstructive sleep apnea and degenerative joint disease.  He was last in clinic in December 2012.  Patinet notes occasional SOB   Still notes some R sided CP  Not associated with activity  No L sided pain Not too active.    Allergies  Allergen Reactions  . Penicillins Hives and Other (See Comments)    "I break out in welts"  . Codeine Nausea And Vomiting  . Sulfonamide Derivatives Nausea And Vomiting    Current Outpatient Prescriptions  Medication Sig Dispense Refill  . aspirin 81 MG tablet Take 2 tabs daily      . citalopram (CELEXA) 20 MG tablet Take 20 mg by mouth daily.      [name] DIMETHICONE, TOPICAL, 2 % CREA Apply topically daily.      [name] doxycycline (VIBRA-TABS) 100 MG tablet       . hydrochlorothiazide (HYDRODIURIL) 25 MG tablet Take 25 mg by mouth daily.       [name] levothyroxine (SYNTHROID, LEVOTHROID) 125 MCG tablet Take 125 mcg by mouth daily before breakfast.      . losartan (COZAAR) 50 MG tablet Take 50 mg by mouth daily.      [name] omeprazole (PRILOSEC) 40 MG capsule Take 1 capsule (40 mg total) by mouth daily before breakfast. 30 minutes before  90 capsule  3  . polyethylene glycol (MIRALAX) packet Take 17 g by mouth daily.        . simvastatin (ZOCOR) 20 MG tablet Take 1 tablet (20 mg total) by mouth at bedtime.  90 tablet  3  . Trospium Chloride 60 MG CP24 Take 60 mg by mouth daily.       No current facility-administered medications for this visit.    Past Medical History  Diagnosis Date  . Observed sleep apnea      can't wear cpap  . HTN (hypertension)   . GERD (gastroesophageal reflux disease)   . Dyspnea     -PFTs compeltely normal 03/20/08 including DLc0  . Chest pain   . Sleep apnea     Most recent sleep study 2010; records at [facility] office  . CAD (coronary artery disease)     cath 3/10: mLAD 30%, pRCA 30%, EF 55-60% Dr. Tenny Craw cardiologist  most recent  stress stress done ~ 2 years ago with Dr. Tenny Craw  . Hypothyroidism   . Low testosterone   . Arthritis   . Depression   . Polycythemia   . PONV (postoperative nausea and vomiting)   . Complication of anesthesia     "he usually gets an ileus after back OR"  . Pulmonary nodule 06/13/2012  . Rosacea     Past Surgical History  Procedure Laterality Date  . Cataract extraction, bilateral  2010  . Meniscus repair  08/2008    left  . Tonsillectomy  09/21/11  . Uvulopalatopharyngoplasty, tonsillectomy and septoplasty  09/21/11  . Posterior fusion lumbar spine  2005; 01/2007; 04/2009    L5; L3-4; L2-3  . Lumbar disc surgery  06/2006    L3  . Back surgery  2005; 06/2006; 01/2007; 04/2009  .  Prostate surgery  2000  . Transurethral resection of prostate  2006    followed by "surgery to get rid of clots"  . Shoulder open rotator cuff repair      left  . Cervical fusion  04/2002    ?C3-4  . Total knee arthroplasty  01/2011    left  . Cardiac catheterization  08/2008  . Esophagogastroduodenoscopy  multiple    last 07/2012 GERD esophagitis, 48 Fr dilation  . Colonoscopy      Family History  Problem Relation Age of Onset  . Anesthesia problems Neg Hx   . Breast cancer Mother   . Colon polyps Mother   . Diabetes Mother   . Breast cancer Sister   . Diabetes Sister     borderline  . Heart Problems Sister   . Heart attack Father   . Alzheimer's disease Father   . Lung cancer Paternal Uncle     x4  . Throat cancer Maternal Uncle   . Liver cancer Maternal Uncle     History   Social History  . Marital Status: Married    Spouse Name:  N/A    Number of Children: 3  . Years of Education: N/A   Occupational History  . retired    Social History Main Topics  . Smoking status: Former Smoker -- 1.50 packs/day for 18 years    Types: Cigarettes    Quit date: 06/28/1974  . Smokeless tobacco: Never Used  . Alcohol Use: No  . Drug Use: No  . Sexual Activity: No   Other Topics Concern  . Not on file   Social History Narrative   Area, 3 children.   Retired from the city of [place]   5 caffeinated beverages daily    Review of Systems:  All systems reviewed.  They are negative to the above problem except as previously stated.  Vital Signs: BP 123/78  Pulse 84  Ht 5\' 10"  (1.778 m)  Wt 291 lb 12.8 oz (132.36 kg)  BMI 41.87 kg/m2  Physical Exam Patient in NAD HEENT:  Normocephalic, atraumatic. EOMI, PERRLA.  Neck: JVP is normal.  No bruits.  Lungs: clear to auscultation. No rales no wheezes.  Heart: Regular rate and rhythm. Normal S1, S2. No S3.   No significant murmurs. PMI not displaced.  Abdomen:  Supple, nontender. Normal bowel sounds. No masses. No hepatomegaly.  Extremities:   Good distal pulses throughout. No lower extremity edema.  Musculoskeletal :moving all extremities.  Neuro:   alert and oriented x3.  CN I-XII grossly intact.  EKG:  NSR  84 bpm  Nonspecific IVCD  LAFB  LVH    Assessment and Plan:  1. CAD  Mild by cath in 2010  I am not convinced of any active symptoms.  Again with R sided symptoms that I do not think are cardiac in origen   2.  HTN  Good control  3.  HL  ON simvistatin.  Need to get lipids from Dr Kevan Ny.  4.  Obesity  Will refer to dietary  Admits to sweets, sugar drinks  Hates to feel hungry. Needs to increase activity  Has stationary bike

## 2013-06-20 ENCOUNTER — Ambulatory Visit: Payer: Medicare Other | Admitting: Physical Therapy

## 2013-06-25 ENCOUNTER — Ambulatory Visit: Payer: Medicare Other | Admitting: Physical Therapy

## 2013-06-27 ENCOUNTER — Ambulatory Visit: Payer: Medicare Other

## 2013-07-02 ENCOUNTER — Encounter: Payer: Self-pay | Admitting: Internal Medicine

## 2013-08-01 ENCOUNTER — Ambulatory Visit: Payer: Medicare Other | Admitting: Neurology

## 2013-11-09 ENCOUNTER — Other Ambulatory Visit: Payer: Self-pay | Admitting: *Deleted

## 2013-11-09 DIAGNOSIS — E785 Hyperlipidemia, unspecified: Secondary | ICD-10-CM

## 2013-12-10 ENCOUNTER — Ambulatory Visit (INDEPENDENT_AMBULATORY_CARE_PROVIDER_SITE_OTHER): Payer: Medicare Other | Admitting: Internal Medicine

## 2013-12-10 ENCOUNTER — Encounter: Payer: Self-pay | Admitting: Internal Medicine

## 2013-12-10 VITALS — BP 108/60 | HR 81 | Temp 98.7°F | Ht 69.5 in | Wt 247.0 lb

## 2013-12-10 DIAGNOSIS — R059 Cough, unspecified: Secondary | ICD-10-CM

## 2013-12-10 DIAGNOSIS — R05 Cough: Secondary | ICD-10-CM

## 2013-12-10 DIAGNOSIS — R0989 Other specified symptoms and signs involving the circulatory and respiratory systems: Principal | ICD-10-CM

## 2013-12-10 DIAGNOSIS — R0609 Other forms of dyspnea: Secondary | ICD-10-CM

## 2013-12-10 DIAGNOSIS — I10 Essential (primary) hypertension: Secondary | ICD-10-CM

## 2013-12-10 MED ORDER — NEBIVOLOL HCL 5 MG PO TABS: 5.0000 mg | ORAL_TABLET | Freq: Every day | ORAL | Status: DC

## 2013-12-10 NOTE — Patient Instructions (Addendum)
We need to add CT sinus to your CT chest tomorrow  Stop cozar   Start bystolic 5 mg daily   Be sure to take  prilosec 40mg   Take 30-60 min before first meal of the day and Pepcid 20 mg one at bedtime until  Return  GERD (REFLUX)  is an extremely common cause of respiratory symptoms, many times with no significant heartburn at all.    It can be treated with medication, but also with lifestyle changes including avoidance of late meals, excessive alcohol, smoking cessation, and avoid fatty foods, chocolate, peppermint, colas, red wine, and acidic juices such as orange juice.  NO MINT OR MENTHOL PRODUCTS SO NO COUGH DROPS  USE SUGARLESS CANDY INSTEAD (jolley ranchers or Stover's)  NO OIL BASED VITAMINS - use powdered substitutes.  Please schedule a follow up office visit in 4 weeks, sooner if needed with all meds in hand

## 2013-12-10 NOTE — Progress Notes (Signed)
Subjective:    Patient ID: [name] [name], male    DOB: 04/13/1946  MRN: [id number]  HPI  28 yowm , who quit smoking around 1980   and weighed about 180 pounds then. seen   02/2008 wt 260 with nl pfts and unexplained sob assoc with obesity rec trial off acei and rx for GERD and did not return.  Reconsulted 12/10/13 at request of Dr Inda Merlin for same problem.   12/10/2013 1st [facility] office visit/ [name] [name]  Chief Complaint  Patient presents with  . Pulmonary Consult    Pt c/o SOB "for years"- worse for "a while".  He states that he gets SOB walking approx 200 ft. He also c/o cough "for years".  Cough is prod with minimal yellow to white sputum.   coughs daily but tends to flare up s pattern but  mostly daytime - no better with rx with nasal steroids or for GERD/ no better with saba in past  No obvious  patterns in day to day or daytime variabilty or assoc   cp or chest tightness, subjective wheeze overt sinus or hb symptoms. No unusual exp hx or h/o childhood pna/ asthma or knowledge of premature birth.  Sleeping ok without nocturnal  or early am exacerbation  of respiratory  c/o's or need for noct saba. Also denies any obvious fluctuation of symptoms with weather or environmental changes or other aggravating or alleviating factors except as outlined above   Current Medications, Allergies, Complete Past Medical History, Past Surgical History, Family History, and Social History were reviewed in [facility] record.                 Past Medical History:  OBSTRUCTIVE SLEEP APNEA (ICD-327.23)  - can't wear cpap  HYPERTENSION (ICD-401.9)  G E R D (ICD-530.81)  Dyspnea  - PFTs completely normal 03/20/08 including DLC0   Family History:  neg resp dz   Social History:  Former smoker. Quit in 1980's. Smoked for 15 years 1 to 2 ppd.  Married  Works for [employer]  Constitutional: Negative for fever, chills, activity change, appetite  change and unexpected weight change.  HENT: Positive for congestion, postnasal drip, sneezing and trouble swallowing. Negative for dental problem, rhinorrhea, sore throat and voice change.   Eyes: Negative for visual disturbance.  Respiratory: Positive for cough and shortness of breath. Negative for choking.   Cardiovascular: Negative for chest pain and leg swelling.  Gastrointestinal: Negative for nausea, vomiting and abdominal pain.  Genitourinary: Negative for difficulty urinating.  Musculoskeletal: Positive for arthralgias.  Skin: Negative for rash.  Psychiatric/Behavioral: Negative for behavioral problems and confusion.       Objective:   Physical Exam   amb mobidly obese wm nad  260 - 250 March 20, 2008 Wt Readings from Last 3 Encounters:  12/10/13 247 lb (112.038 kg)  06/18/13 291 lb 12.8 oz (132.36 kg)  05/01/13 288 lb (130.636 kg)     HEENT: edentulous with nl turbinates, and orophanx. Nl external ear canals without cough reflex  NECK :  without JVD/Nodes/TM/ nl carotid upstrokes bilaterally   LUNGS: no acc muscle use, clear to A and P bilaterally without cough on insp or exp maneuvers   CV:  RRR  no s3 or murmur or increase in P2, no edema   ABD:  soft and nontender with nl excursion in the supine position. No bruits or organomegaly, bowel sounds nl  MS:  warm without  deformities, calf tenderness, cyanosis or clubbing  SKIN: warm and dry without lesions    NEURO:  alert, approp, no deficits      12/12/12 CT Multiple small bilateral pulmonary nodules, as described above.  42-month stability has been demonstrated for the dominant 6 mm right  lower lobe nodule.          Assessment & Plan:

## 2013-12-11 ENCOUNTER — Encounter (HOSPITAL_COMMUNITY): Payer: Self-pay

## 2013-12-11 ENCOUNTER — Ambulatory Visit (HOSPITAL_COMMUNITY)
Admission: RE | Admit: 2013-12-11 | Discharge: 2013-12-11 | Disposition: A | Discharge status: Home / Self Care | Payer: Medicare Other | Source: Ambulatory Visit | Attending: Oncology | Admitting: Oncology

## 2013-12-11 ENCOUNTER — Encounter: Payer: Self-pay | Admitting: Internal Medicine

## 2013-12-11 ENCOUNTER — Other Ambulatory Visit (HOSPITAL_BASED_OUTPATIENT_CLINIC_OR_DEPARTMENT_OTHER): Payer: Medicare Other

## 2013-12-11 DIAGNOSIS — R911 Solitary pulmonary nodule: Secondary | ICD-10-CM

## 2013-12-11 DIAGNOSIS — R059 Cough, unspecified: Secondary | ICD-10-CM | POA: Insufficient documentation

## 2013-12-11 DIAGNOSIS — K7689 Other specified diseases of liver: Secondary | ICD-10-CM | POA: Insufficient documentation

## 2013-12-11 DIAGNOSIS — E785 Hyperlipidemia, unspecified: Secondary | ICD-10-CM

## 2013-12-11 DIAGNOSIS — R05 Cough: Secondary | ICD-10-CM

## 2013-12-11 DIAGNOSIS — R918 Other nonspecific abnormal finding of lung field: Secondary | ICD-10-CM | POA: Insufficient documentation

## 2013-12-11 DIAGNOSIS — D751 Secondary polycythemia: Secondary | ICD-10-CM

## 2013-12-11 LAB — COMPREHENSIVE METABOLIC PANEL (CC13)
ALT: 32 U/L (ref 0–55)
AST: 27 U/L (ref 5–34)
Albumin: 3.8 g/dL (ref 3.5–5.0)
Alkaline Phosphatase: 66 U/L (ref 40–150)
Anion Gap: 9 mEq/L (ref 3–11)
BUN: 14.3 mg/dL (ref 7.0–26.0)
CALCIUM: 9.2 mg/dL (ref 8.4–10.4)
CHLORIDE: 102 meq/L (ref 98–109)
CO2: 25 meq/L (ref 22–29)
Creatinine: 0.9 mg/dL (ref 0.7–1.3)
GLUCOSE: 106 mg/dL (ref 70–140)
POTASSIUM: 4 meq/L (ref 3.5–5.1)
Sodium: 136 mEq/L (ref 136–145)
TOTAL PROTEIN: 8 g/dL (ref 6.4–8.3)
Total Bilirubin: 0.77 mg/dL (ref 0.20–1.20)

## 2013-12-11 LAB — CBC WITH DIFFERENTIAL/PLATELET
BASO%: 0.5 % (ref 0.0–2.0)
Basophils Absolute: 0 10*3/uL (ref 0.0–0.1)
EOS ABS: 0.1 10*3/uL (ref 0.0–0.5)
EOS%: 2.3 % (ref 0.0–7.0)
HCT: 45.2 % (ref 38.4–49.9)
HGB: 14.8 g/dL (ref 13.0–17.1)
LYMPH%: 22.5 % (ref 14.0–49.0)
MCH: 30.8 pg (ref 27.2–33.4)
MCHC: 32.8 g/dL (ref 32.0–36.0)
MCV: 93.7 fL (ref 79.3–98.0)
MONO#: 0.5 10*3/uL (ref 0.1–0.9)
MONO%: 9.8 % (ref 0.0–14.0)
NEUT%: 64.9 % (ref 39.0–75.0)
NEUTROS ABS: 3.6 10*3/uL (ref 1.5–6.5)
PLATELETS: 231 10*3/uL (ref 140–400)
RBC: 4.82 10*6/uL (ref 4.20–5.82)
RDW: 13.9 % (ref 11.0–14.6)
WBC: 5.6 10*3/uL (ref 4.0–10.3)
lymph#: 1.3 10*3/uL (ref 0.9–3.3)

## 2013-12-11 MED ORDER — IOHEXOL 300 MG/ML  SOLN
80.0000 mL | Freq: Once | INTRAMUSCULAR | Status: AC | PRN
  Administered 2013-12-11: 80 mL via INTRAVENOUS

## 2013-12-11 NOTE — Assessment & Plan Note (Signed)
Changed losartan to bysolic 3/42/8768 due to ? Upper airway issues

## 2013-12-11 NOTE — Assessment & Plan Note (Addendum)
-   12/10/2013  Walked RA x 3 laps @ 185 ft each stopped due to  Legs tired, no sob, no desat / peak HR 107, lowest sat 96%  Symptoms are markedly disproportionate to objective findings and not clear this is a lung problem but pt does appear to have difficult airway management issues. DDX of  difficult airways management all start with A and  include Adherence, Ace Inhibitors, Acid Reflux, Active Sinus Disease, Alpha 1 Antitripsin deficiency, Anxiety masquerading as Airways dz,  ABPA,  allergy(esp in young), Aspiration (esp in elderly), Adverse effects of DPI,  Active smokers, plus two Bs  = Bronchiectasis and Beta blocker use..and one C= CHF  Adherence is always the initial "prime suspect" and is a multilayered concern that requires a "trust but verify" approach in every patient - starting with knowing how to use medications, especially inhalers, correctly, keeping up with refills and understanding the fundamental difference between maintenance and prns vs those medications only taken for a very short course and then stopped and not refilled.  ? Acid (or non-acid) GERD > always difficult to exclude as up to 75% of pts in some series report no assoc GI/ Heartburn symptoms> rec max (24h)  acid suppression and diet restrictions/ reviewed and instructions given in writing.    ? acei effect > For reasons that may related to vascular permability and nitric oxide pathways but not elevated  bradykinin levels (as seen with  ACEi use) losartan in the generic form has been reported now from mulitple sources  to cause a similar pattern of non-specific  upper airway symptoms as seen with acei.   This has not been reported with exposure to the other ARB's to date, so it seems reasonable for now to try either generic diovan or avapro if ARB needed or use an alternative class altogether.  See:  Lelon Frohlich Allergy Asthma Immunol  2008: 101: p [phone]   See hbp  ? Active sinus dz > add sinus CT to chest ct already scheduled  See  instructions for specific recommendations which were reviewed directly with the patient who was given a copy with highlighter outlining the key components.

## 2013-12-13 ENCOUNTER — Encounter: Payer: Self-pay | Admitting: Oncology

## 2013-12-13 ENCOUNTER — Ambulatory Visit (HOSPITAL_BASED_OUTPATIENT_CLINIC_OR_DEPARTMENT_OTHER): Payer: Medicare Other | Admitting: Oncology

## 2013-12-13 VITALS — BP 128/74 | HR 72 | Temp 98.3°F | Resp 20 | Ht 69.5 in | Wt 292.6 lb

## 2013-12-13 DIAGNOSIS — R911 Solitary pulmonary nodule: Secondary | ICD-10-CM

## 2013-12-13 DIAGNOSIS — D751 Secondary polycythemia: Secondary | ICD-10-CM

## 2013-12-13 DIAGNOSIS — G4733 Obstructive sleep apnea (adult) (pediatric): Secondary | ICD-10-CM

## 2013-12-13 NOTE — Progress Notes (Signed)
Quick Note:  LMTCB ______ 

## 2013-12-13 NOTE — Progress Notes (Signed)
Hematology and Oncology Follow Up Visit  [name] [name] [id number] 1946/04/25 68 y.o. 12/13/2013 9:20 AM  CC: [name] [name], M.D.    Principle Diagnosis:This is a 68 year old gentleman with polycythemia, initially seen in March 2012, etiology most likely reactive due to obstructive sleep apnea and androgen supplements. He was also found to have an incidental pulmonary nodules.  Current therapy: Observation and Surveillance.   Interim History:  [name] [name] presents today for a followup visit. Since his last visit, he continued to be relatively stable. He continues to have mild respiratory symptoms including chronic cough and dyspnea on exertion. He has had  obstructive sleep apnea symptoms that include apneic episodes, snoring, he has really a thick neck and thick tonsils. He had tonsill surgery with improvement of symptoms. He continues to have chronic back and knee pain related to arthritis.  Did not report any bleeding complications.  Overall, performance status has been limited due to osteoarthritis of the knees. He does not report any headaches or blurry vision or double vision. Does not report any fevers or chills or sweats. Does not report any hemoptysis or hematemesis. Is not reporting nausea vomiting abdominal pain. Does not report any frequency urgency or hesitancy. Does not report any lymphadenopathy or petechiae. Rest of his review of systems unremarkable. Medications: I have reviewed the patient's current medications.  Current Outpatient Prescriptions  Medication Sig Dispense Refill  . aspirin 81 MG tablet Take 2 tabs daily      . citalopram (CELEXA) 20 MG tablet Take 20 mg by mouth daily.      [name] guaiFENesin (MUCINEX) 600 MG 12 hr tablet Take 600 mg by mouth 2 (two) times daily.      . hydrochlorothiazide (HYDRODIURIL) 25 MG tablet Take 25 mg by mouth daily.       [name] levothyroxine (SYNTHROID, LEVOTHROID) 125 MCG tablet Take 125 mcg by mouth daily before breakfast.      . losartan  (COZAAR) 50 MG tablet       . mometasone (NASONEX) 50 MCG/ACT nasal spray Place 2 sprays into the nose daily.      . nebivolol (BYSTOLIC) 5 MG tablet Take 1 tablet (5 mg total) by mouth daily.    0  . omeprazole (PRILOSEC) 40 MG capsule Take 1 capsule (40 mg total) by mouth daily before breakfast. 30 minutes before  90 capsule  3  . polyethylene glycol (MIRALAX) packet Take 17 g by mouth daily.        . simvastatin (ZOCOR) 20 MG tablet Take 1 tablet (20 mg total) by mouth at bedtime.  90 tablet  3  . Trospium Chloride 60 MG CP24 Take 60 mg by mouth daily.       No current facility-administered medications for this visit.    Allergies:  Allergies  Allergen Reactions  . Penicillins Hives and Other (See Comments)    "I break out in welts"  . Codeine Nausea And Vomiting  . Sulfonamide Derivatives Nausea And Vomiting    Past Medical History, Surgical history, Social history, and Family History were reviewed and updated.    Physical Exam: Blood pressure 128/74, pulse 72, temperature 98.3 F (36.8 C), temperature source Oral, resp. rate 20, height 5' 9.5" (1.765 m), weight 292 lb 9.6 oz (132.722 kg), SpO2 100.00%. ECOG: 1 General appearance: alert awake not in any distress Head: Normocephalic, without obvious abnormality, atraumatic Neck: no adenopathy Lymph nodes: Cervical, supraclavicular, and axillary nodes normal. Heart:regular rate and rhythm, S1, S2 normal, no  murmur, click, rub or gallop Lung:chest clear, no wheezing.  Abdomen: soft, non-tender, without masses or organomegaly EXT:no erythema, induration, or nodules. Trace edema noted.   Lab Results: Lab Results  Component Value Date   WBC 5.6 12/11/2013   HGB 14.8 12/11/2013   HCT 45.2 12/11/2013   MCV 93.7 12/11/2013   PLT 231 12/11/2013     Chemistry      Component Value Date/Time   NA 136 12/11/2013 0805   NA 136 09/17/2011 0902   K 4.0 12/11/2013 0805   K 4.7 09/17/2011 0902   CL 102 12/12/2012 0752   CL 97 09/17/2011  0902   CO2 25 12/11/2013 0805   CO2 29 09/17/2011 0902   BUN 14.3 12/11/2013 0805   BUN 15 09/17/2011 0902   CREATININE 0.9 12/11/2013 0805   CREATININE 0.80 09/17/2011 0902      Component Value Date/Time   CALCIUM 9.2 12/11/2013 0805   CALCIUM 10.2 09/17/2011 0902   ALKPHOS 66 12/11/2013 0805   ALKPHOS 71 02/10/2011 0939   AST 27 12/11/2013 0805   AST 25 02/10/2011 0939   ALT 32 12/11/2013 0805   ALT 27 02/10/2011 0939   BILITOT 0.77 12/11/2013 0805   BILITOT 0.5 02/10/2011 0939     EXAM:  CT CHEST WITH CONTRAST  TECHNIQUE:  Multidetector CT imaging of the chest was performed during  intravenous contrast administration.  CONTRAST: 10mL OMNIPAQUE IOHEXOL 300 MG/ML SOLN  COMPARISON: None.  FINDINGS:  Stable bilateral pulmonary nodules, including:  --4 mm right upper lobe nodule (series 6/image 28)  --6 mm subpleural right lower lobe nodule (series 6/image 38)  --5 mm nodule along the right hemidiaphragm (series 6/image 44)  No new/suspicious pulmonary nodules. Calcified pleural plaques. No  pleural effusion or pneumothorax.  Stable mild nodularity along the right mid trachea (series 6/image  12).  Heart is top-normal in size. No pericardial effusion. Coronary  atherosclerosis.  No suspicious mediastinal, hilar, or axillary lymphadenopathy.  Visualized upper abdomen is notable for moderate hepatic steatosis.  Degenerative changes of the visualized thoracolumbar spine.  IMPRESSION:  Stable bilateral pulmonary nodules, measuring up to 6 mm, as above.  18 month stability of the dominant nodule has been demonstrated. No  dedicated follow-up imaging is required per Fleischner Society  guidelines.  Stable mild nodularity along the right mid trachea, favored to  reflect a benign etiology such as papillomas or hamartomas. Consider  bronchoscopic correlation as clinically warranted.  Nodule recommendation follows the consensus statement: Guidelines  for Management of Small Pulmonary Nodules  Detected on CT Scans: A  Statement from the [employer] as published in Radiology  2005; 237:395-400.    Impression and Plan: This is a pleasant 68 year old gentleman with the following issues: 1. 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 This has normalized since his tonsil surgery. No further work up needed at this time. His hemoglobin on 12/11/2013 showed normal level at this time. 2. Thrombosis prophylaxis. On low dose ASA. 3. Pulmonary nodules. Pt has a history of smoking 1.5-2 ppd for 18 years. CT scan on 616 was discussed today and show stable nodule and less likely malignancy. Based on the radiology recommendation no further imaging studies will be done routinely. 4. Followup: Will be as needed from an oncology standpoint.    OIZTIW,PYKDX 6/18/20159:20 AM

## 2013-12-14 ENCOUNTER — Other Ambulatory Visit: Payer: Self-pay | Admitting: Internal Medicine

## 2013-12-20 ENCOUNTER — Encounter: Payer: Self-pay | Admitting: *Deleted

## 2013-12-20 NOTE — Progress Notes (Signed)
Quick Note:  LMTCB Will go ahead and send a letter  ______

## 2013-12-20 NOTE — Progress Notes (Signed)
Quick Note:  LMTCB ______ 

## 2014-01-04 ENCOUNTER — Other Ambulatory Visit: Payer: Self-pay | Admitting: Neurological Surgery

## 2014-01-04 DIAGNOSIS — M5416 Radiculopathy, lumbar region: Secondary | ICD-10-CM

## 2014-01-07 ENCOUNTER — Ambulatory Visit: Payer: Medicare Other | Admitting: Internal Medicine

## 2014-01-07 ENCOUNTER — Encounter: Payer: Self-pay | Admitting: Internal Medicine

## 2014-01-07 ENCOUNTER — Ambulatory Visit: Admission: RE | Admit: 2014-01-07 | Payer: Medicare Other | Source: Ambulatory Visit

## 2014-01-07 ENCOUNTER — Ambulatory Visit (INDEPENDENT_AMBULATORY_CARE_PROVIDER_SITE_OTHER): Payer: Medicare Other | Admitting: Internal Medicine

## 2014-01-07 VITALS — BP 102/60 | HR 77 | Temp 97.8°F | Ht 68.0 in | Wt 290.0 lb

## 2014-01-07 DIAGNOSIS — R059 Cough, unspecified: Secondary | ICD-10-CM

## 2014-01-07 DIAGNOSIS — R0989 Other specified symptoms and signs involving the circulatory and respiratory systems: Secondary | ICD-10-CM

## 2014-01-07 DIAGNOSIS — R05 Cough: Secondary | ICD-10-CM

## 2014-01-07 DIAGNOSIS — R0609 Other forms of dyspnea: Secondary | ICD-10-CM

## 2014-01-07 NOTE — Assessment & Plan Note (Signed)
-   12/10/2013  Walked RA x 3 laps @ 185 ft each stopped due to  Legs tired, no sob, no desat / peak HR 107, lowest sat 96%  C/w obesity/ deconditioning

## 2014-01-07 NOTE — Assessment & Plan Note (Signed)
CT sinus 12/11/2013 > No evidence of sinusitis.  Most likely this is  Classic Upper airway cough syndrome, so named because it's frequently impossible to sort out how much is  CR/sinusitis with freq throat clearing (which can be related to primary GERD)   vs  causing  secondary (" extra esophageal")  GERD from wide swings in gastric pressure that occur with throat clearing, often  promoting self use of mint and menthol lozenges that reduce the lower esophageal sphincter tone and exacerbate the problem further in a cyclical fashion.   These are the same pts (now being labeled as having "irritable larynx syndrome" by some cough centers) who not infrequently have a history of having failed to tolerate ace inhibitors,  dry powder inhalers or biphosphonates or report having atypical reflux symptoms that don't respond to standard doses of PPI , and are easily confused as having aecopd or asthma flares by even experienced allergists/ pulmonologists.  Try adding h2 and h1 at hs per guidelines and f/u prn   Discussed that non-acid reflux is a major challenge and is mainly treated by wt loss and diet/ revewed in writing

## 2014-01-07 NOTE — Progress Notes (Signed)
Subjective:    Patient ID: [name] [name], male    DOB: 07-Sep-1945  MRN: [id number]    Brief patient profile:  36 yowm , who quit smoking around 1980   and weighed about 180 pounds then. seen   02/2008 wt 260 with nl pfts and unexplained sob assoc with obesity rec trial off acei and rx for GERD and did not return.  Reconsulted 12/10/13 at request of Dr Inda Merlin for same problem.    History of Present Illness  12/10/2013 1st [facility] office visit/ [name]  Chief Complaint  Patient presents with  . Pulmonary Consult    Pt c/o SOB "for years"- worse for "a while".  He states that he gets SOB walking approx 200 ft. He also c/o cough "for years".  Cough is prod with minimal yellow to white sputum.   coughs daily but tends to flare up s pattern but  mostly daytime - no better with rx with nasal steroids or for GERD/ no better with saba in past rec We need to add CT sinus > neg  Stop cozar  Start bystolic 5 mg daily  Be sure to take  prilosec 40mg   Take 30-60 min before first meal of the day and Pepcid 20 mg one at bedtime until  Return GERD diet   01/07/2014 f/u ov/[name] re:  Obesity/ deconditioning/ chronic cough Did not follow diet or try the pepcid at bedtime Daily cough esp in am assoc with sensation of pnds no difference off cozar Not limited by breathing from desired activities  But rather by back and legs No noct cough or sob  No obvious day to day or daytime variabilty or assoc chronic cough or cp or chest tightness, subjective wheeze overt sinus or hb symptoms. No unusual exp hx or h/o childhood pna/ asthma or knowledge of premature birth.  Sleeping ok without nocturnal  or early am exacerbation  of respiratory  c/o's or need for noct saba. Also denies any obvious fluctuation of symptoms with weather or environmental changes or other aggravating or alleviating factors except as outlined above   Current Medications, Allergies, Complete Past Medical History, Past Surgical History,  Family History, and Social History were reviewed in [employer] [facility] record.  ROS  The following are not active complaints unless bolded sore throat, dysphagia, dental problems, itching, sneezing,  nasal congestion or excess/ purulent secretions, ear ache,   fever, chills, sweats, unintended wt loss, pleuritic or exertional cp, hemoptysis,  orthopnea pnd or leg swelling, presyncope, palpitations, heartburn, abdominal pain, anorexia, nausea, vomiting, diarrhea  or change in bowel or urinary habits, change in stools or urine, dysuria,hematuria,  rash, arthralgias, visual complaints, headache, numbness weakness or ataxia or problems with walking or coordination,  change in mood/affect or memory.                   Past Medical History:  OBSTRUCTIVE SLEEP APNEA (ICD-327.23)  - can't wear cpap  HYPERTENSION (ICD-401.9)  G E R D (ICD-530.81)  Dyspnea  - PFTs completely normal 03/20/08 including DLC0   Family History:  neg resp dz   Social History:  Former smoker. Quit in 1980's. Smoked for 15 years 1 to 2 ppd.  Married  Works for [employer]        Objective:   Physical Exam   amb mobidly obese wm nad  260 - 250 March 20, 2008  > 01/07/2014  290  Wt Readings from Last 3 Encounters:  12/10/13 247 lb (112.038 kg)  06/18/13 291 lb 12.8 oz (132.36 kg)  05/01/13 288 lb (130.636 kg)     HEENT: edentulous with nl turbinates, and orophanx. Nl external ear canals without cough reflex  NECK :  without JVD/Nodes/TM/ nl carotid upstrokes bilaterally   LUNGS: no acc muscle use, clear to A and P bilaterally without cough on insp or exp maneuvers   CV:  RRR  no s3 or murmur or increase in P2, no edema   ABD: markedly obese soft and nontender with nl excursion in the supine position. No bruits or organomegaly, bowel sounds nl  MS:  warm without deformities, calf tenderness, cyanosis or clubbing  SKIN: warm and dry without lesions    NEURO:  alert,  approp, no deficits      12/12/12 CT Multiple small bilateral pulmonary nodules, as described above.  29-month stability has been demonstrated for the dominant 6 mm right  lower lobe nodule.   12/11/13 CT  No change, no need for f/u studies          Assessment & Plan:

## 2014-01-07 NOTE — Patient Instructions (Addendum)
You do not have have copd and you never will   Try pepcid 20 mg at bedtime and chlortimeton 4 mg x 2 at bedtime  For drainage take chlortrimeton (chlorpheniramine) 4 mg every 4 hours available over the counter (may cause drowsiness)   GERD (REFLUX)  is an extremely common cause of respiratory symptoms, many times with no significant heartburn at all.    It can be treated with medication, but also with lifestyle changes including avoidance of late meals, excessive alcohol, smoking cessation, and avoid fatty foods, chocolate, peppermint, colas, red wine, and acidic juices such as orange juice.  NO MINT OR MENTHOL PRODUCTS SO NO COUGH DROPS  USE SUGARLESS CANDY INSTEAD (jolley ranchers or Stover's)  NO OIL BASED VITAMINS - use powdered substitutes.   Ok to resume losartan instead of bystolic   Pulmonary follow up is as needed

## 2014-01-10 ENCOUNTER — Ambulatory Visit
Admission: RE | Admit: 2014-01-10 | Discharge: 2014-01-10 | Disposition: A | Discharge status: Home / Self Care | Payer: Medicare Other | Source: Ambulatory Visit | Attending: Neurological Surgery | Admitting: Neurological Surgery

## 2014-01-10 DIAGNOSIS — M5416 Radiculopathy, lumbar region: Secondary | ICD-10-CM

## 2014-01-10 MED ORDER — GADOBENATE DIMEGLUMINE 529 MG/ML IV SOLN
20.0000 mL | Freq: Once | INTRAVENOUS | Status: AC | PRN
  Administered 2014-01-10: 20 mL via INTRAVENOUS

## 2014-01-12 ENCOUNTER — Other Ambulatory Visit: Payer: Medicare Other

## 2014-04-08 ENCOUNTER — Encounter: Payer: Self-pay | Admitting: Internal Medicine

## 2014-05-02 ENCOUNTER — Other Ambulatory Visit: Payer: Self-pay | Admitting: Internal Medicine

## 2014-06-06 ENCOUNTER — Other Ambulatory Visit: Payer: Self-pay | Admitting: Neurological Surgery

## 2014-06-06 DIAGNOSIS — M4712 Other spondylosis with myelopathy, cervical region: Secondary | ICD-10-CM

## 2014-06-08 ENCOUNTER — Ambulatory Visit
Admission: RE | Admit: 2014-06-08 | Discharge: 2014-06-08 | Disposition: A | Discharge status: Home / Self Care | Payer: Medicare Other | Source: Ambulatory Visit | Attending: Neurological Surgery | Admitting: Neurological Surgery

## 2014-06-08 DIAGNOSIS — M4712 Other spondylosis with myelopathy, cervical region: Secondary | ICD-10-CM

## 2014-07-07 NOTE — Progress Notes (Signed)
HPI Patient is a 69 year old with a history of nonobstructive CAD by cardiac catheterization in 3/10. At that time he had a mid LAD 30% and proximal RCA 30% with an EF of 55-60%. Myoview prior to his cardiac catheterization 2/10 demonstrated inferior scar with mild to moderate peri-infarct ischemia. Echocardiogram 3/03 demonstrated normal LV function. His other medical history includes hypertension, hyperlipidemia, hypothyroidism, depression, testosterone deficiency, obstructive sleep apnea and degenerative joint disease.  He was last in clinic in 2014  patinet says that legs weak  Gives out Gets SOB   Back arthitis a problem   No presyncpe   Twinges of CP   Allergies  Allergen Reactions  . Penicillins Hives and Other (See Comments)    "I break out in welts"  . Codeine Nausea And Vomiting  . Sulfonamide Derivatives Nausea And Vomiting    Current Outpatient Prescriptions  Medication Sig Dispense Refill  . aspirin 81 MG tablet Take 2 tabs daily    . citalopram (CELEXA) 20 MG tablet Take 20 mg by mouth daily.    . famotidine (PEPCID) 20 MG tablet Take 20 mg by mouth daily.    . fluticasone (FLONASE) 50 MCG/ACT nasal spray Place 2 sprays into both nostrils daily.     . hydrochlorothiazide (HYDRODIURIL) 25 MG tablet Take 25 mg by mouth daily.     [name] levothyroxine (SYNTHROID, LEVOTHROID) 150 MCG tablet Take 150 mcg by mouth daily before breakfast.    . losartan (COZAAR) 50 MG tablet Take 50 mg by mouth daily.    [name] omeprazole (PRILOSEC) 40 MG capsule Take 1 capsule (40 mg total) by mouth daily before breakfast. 30 minutes before 90 capsule 3  . polyethylene glycol (MIRALAX) packet Take 17 g by mouth daily.      . simvastatin (ZOCOR) 20 MG tablet Take 1 tablet by mouth at  bedtime 30 tablet 3  . Trospium Chloride 60 MG CP24 Take 60 mg by mouth daily.     No current facility-administered medications for this visit.    Past Medical History  Diagnosis Date  . Observed sleep apnea     can't  wear cpap  . HTN (hypertension)   . GERD (gastroesophageal reflux disease)   . Dyspnea     -PFTs compeltely normal 03/20/08 including DLc0  . Chest pain   . Sleep apnea     Most recent sleep study 2010; records at [facility] office  . CAD (coronary artery disease)     cath 3/10: mLAD 30%, pRCA 30%, EF 55-60% Dr. Harrington Challenger cardiologist  most recent  stress stress done ~ 2 years ago with Dr. Harrington Challenger  . Hypothyroidism   . Low testosterone   . Arthritis   . Depression   . Polycythemia   . PONV (postoperative nausea and vomiting)   . Complication of anesthesia     "he usually gets an ileus after back OR"  . Pulmonary nodule 06/13/2012  . Rosacea     Past Surgical History  Procedure Laterality Date  . Cataract extraction, bilateral  2010  . Meniscus repair  08/2008    left  . Tonsillectomy  09/21/11  . Uvulopalatopharyngoplasty, tonsillectomy and septoplasty  09/21/11  . Posterior fusion lumbar spine  2005; 01/2007; 04/2009    L5; L3-4; L2-3  . Lumbar disc surgery  06/2006    L3  . Back surgery  2005; 06/2006; 01/2007; 04/2009  . Prostate surgery  2000  . Transurethral resection of prostate  2006  followed by "surgery to get rid of clots"  . Shoulder open rotator cuff repair      left  . Cervical fusion  04/2002    ?C3-4  . Total knee arthroplasty  01/2011    left  . Cardiac catheterization  08/2008  . Esophagogastroduodenoscopy  multiple    last 07/2012 GERD esophagitis, 48 Fr dilation  . Colonoscopy      Family History  Problem Relation Age of Onset  . Anesthesia problems Neg Hx   . Breast cancer Mother   . Colon polyps Mother   . Diabetes Mother   . Breast cancer Sister   . Diabetes Sister     borderline  . Heart Problems Sister   . Heart attack Father   . Alzheimer's disease Father   . Lung cancer Paternal Uncle     x4  . Throat cancer Maternal Uncle   . Liver cancer Maternal Uncle     History   Social History  . Marital Status: Married    Spouse Name: N/A     Number of Children: 3  . Years of Education: N/A   Occupational History  . retired    Social History Main Topics  . Smoking status: Former Smoker -- 1.50 packs/day for 18 years    Types: Cigarettes    Quit date: 06/28/1974  . Smokeless tobacco: Never Used  . Alcohol Use: No  . Drug Use: No  . Sexual Activity: No   Other Topics Concern  . Not on file   Social History Narrative   Area, 3 children.   Retired from the city of [place]   5 caffeinated beverages daily    Review of Systems:  All systems reviewed.  They are negative to the above problem except as previously stated.  Vital Signs: BP 111/70 mmHg  Pulse 84  Ht 5\' 8"  (1.727 m)  Wt 295 lb 3.2 oz (133.902 kg)  BMI 44.90 kg/m2  Physical Exam Patient in NAD HEENT:  Normocephalic, atraumatic. EOMI, PERRLA.  Neck: JVP is normal.  No bruits.  Lungs: clear to auscultation. No rales no wheezes.  Heart: Regular rate and rhythm. Normal S1, S2. No S3.   No significant murmurs. PMI not displaced.  Abdomen:  Supple, nontender. Normal bowel sounds. No masses. No hepatomegaly.  Extremities:   Good distal pulses throughout. No lower extremity edema.  Musculoskeletal :moving all extremities.  Neuro:   alert and oriented x3.  CN I-XII grossly intact.  EKG:  NSR  84 bpm  RBBB    LAFB  LVH    Assessment and Plan:  1. CAD  Mild by cath in 2010 Denis CP but does have some SOB Chronic  With EKG change (RBBB) will set up for echo  Consider myoview    2.  HTN  Good control  3.  HL  ON simvistatin.  Need to get lipids from Dr Inda Merlin.  4.  Obesity  Will refer to dietary  Admits to sweets, sugar drinks  Hates to feel hungry. Needs to increase activity  Has stationary bike

## 2014-07-08 ENCOUNTER — Encounter: Payer: Self-pay | Admitting: Internal Medicine

## 2014-07-08 ENCOUNTER — Ambulatory Visit (INDEPENDENT_AMBULATORY_CARE_PROVIDER_SITE_OTHER): Payer: Medicare Other | Admitting: Internal Medicine

## 2014-07-08 VITALS — BP 111/70 | HR 84 | Ht 68.0 in | Wt 295.2 lb

## 2014-07-08 DIAGNOSIS — I452 Bifascicular block: Secondary | ICD-10-CM

## 2014-07-08 DIAGNOSIS — I2510 Atherosclerotic heart disease of native coronary artery without angina pectoris: Secondary | ICD-10-CM

## 2014-07-08 MED ORDER — SIMVASTATIN 20 MG PO TABS: 20.0000 mg | ORAL_TABLET | Freq: Every day | ORAL | Status: DC

## 2014-07-08 NOTE — Patient Instructions (Signed)
Your physician recommends that you continue on your current medications as directed. Please refer to the Current Medication list given to you today.  Your physician has requested that you have an echocardiogram. Echocardiography is a painless test that uses sound waves to create images of your heart. It provides your doctor with information about the size and shape of your heart and how well your heart's chambers and valves are working. This procedure takes approximately one hour. There are no restrictions for this procedure.  Your physician recommends that you schedule a follow-up appointment based on results of echocardiogram

## 2014-07-17 ENCOUNTER — Ambulatory Visit (HOSPITAL_COMMUNITY): Payer: Medicare Other | Attending: Cardiovascular Disease | Admitting: Cardiology

## 2014-07-17 DIAGNOSIS — I452 Bifascicular block: Secondary | ICD-10-CM | POA: Diagnosis not present

## 2014-07-17 DIAGNOSIS — I2510 Atherosclerotic heart disease of native coronary artery without angina pectoris: Secondary | ICD-10-CM | POA: Diagnosis present

## 2014-07-17 NOTE — Progress Notes (Signed)
Echo performed. 

## 2015-03-31 ENCOUNTER — Other Ambulatory Visit: Payer: Self-pay | Admitting: Orthopedic Surgery

## 2015-03-31 DIAGNOSIS — M25561 Pain in right knee: Secondary | ICD-10-CM

## 2015-04-02 ENCOUNTER — Ambulatory Visit
Admission: RE | Admit: 2015-04-02 | Discharge: 2015-04-02 | Disposition: A | Discharge status: Home / Self Care | Payer: Medicare Other | Source: Ambulatory Visit | Attending: Orthopedic Surgery | Admitting: Orthopedic Surgery

## 2015-04-02 DIAGNOSIS — M25561 Pain in right knee: Secondary | ICD-10-CM

## 2015-05-08 ENCOUNTER — Encounter (HOSPITAL_BASED_OUTPATIENT_CLINIC_OR_DEPARTMENT_OTHER): Payer: Self-pay | Admitting: *Deleted

## 2015-05-08 NOTE — Pre-Procedure Instructions (Signed)
Pt to see cardiology Monday 05/12/15 at 1330 for cardiac clearance needed  for surgery Wed 05/14/15.  Will come in for labs following appt.

## 2015-05-09 ENCOUNTER — Ambulatory Visit: Payer: Self-pay | Admitting: Physician Assistant

## 2015-05-09 NOTE — H&P (Signed)
[name] [name] is an 69 y.o. male.   Chief Complaint: right knee pain HPI: .  Right knee we injected as well, very temporary relief and now with returned symptoms, more medial joint line pain.  Also complaints of chronic pain and weakness with the left knee since his replacement four years ago.  Also has had multiple back surgeries.  Unclear whether there is some radicular cause to this pain.  MRI right knee shows meniscal tearing. Moderate osteoarthritis.   Past Medical History  Diagnosis Date  . Observed sleep apnea     can't wear cpap  . HTN (hypertension)   . GERD (gastroesophageal reflux disease)   . Dyspnea     -PFTs compeltely normal 03/20/08 including DLc0  . Sleep apnea     Most recent sleep study 2010; records at [facility] office  . CAD (coronary artery disease)     cath 3/10: mLAD 30%, pRCA 30%, EF 55-60% Dr. Harrington Challenger cardiologist  most recent  stress stress done ~ 2 years ago with Dr. Harrington Challenger  . Hypothyroidism   . Low testosterone   . Arthritis   . Depression   . Polycythemia   . PONV (postoperative nausea and vomiting)   . Complication of anesthesia     "he usually gets an ileus after back OR"  . Pulmonary nodule 06/13/2012  . Rosacea     Past Surgical History  Procedure Laterality Date  . Cataract extraction, bilateral  2010  . Meniscus repair  08/2008    left  . Tonsillectomy  09/21/11  . Uvulopalatopharyngoplasty, tonsillectomy and septoplasty  09/21/11  . Posterior fusion lumbar spine  2005; 01/2007; 04/2009    L5; L3-4; L2-3  . Lumbar disc surgery  06/2006    L3  . Back surgery  2005; 06/2006; 01/2007; 04/2009  . Prostate surgery  2000  . Transurethral resection of prostate  2006    followed by "surgery to get rid of clots"  . Shoulder open rotator cuff repair      left  . Cervical fusion  04/2002    ?C3-4  . Total knee arthroplasty  01/2011    left  . Cardiac catheterization  08/2008  . Esophagogastroduodenoscopy  multiple    last 07/2012 GERD esophagitis, 48 Fr  dilation  . Colonoscopy      Family History  Problem Relation Age of Onset  . Anesthesia problems Neg Hx   . Breast cancer Mother   . Colon polyps Mother   . Diabetes Mother   . Breast cancer Sister   . Diabetes Sister     borderline  . Heart Problems Sister   . Heart attack Father   . Alzheimer's disease Father   . Lung cancer Paternal Uncle     x4  . Throat cancer Maternal Uncle   . Liver cancer Maternal Uncle    Social History:  reports that he quit smoking about 40 years ago. His smoking use included Cigarettes. He has a 27 pack-year smoking history. He has never used smokeless tobacco. He reports that he does not drink alcohol or use illicit drugs.  Allergies:  Allergies  Allergen Reactions  . Penicillins Hives and Other (See Comments)    "I break out in welts"  . Codeine Nausea And Vomiting  . Sulfonamide Derivatives Nausea And Vomiting     (Not in a hospital admission)  No results found for this or any previous visit (from the past 48 hour(s)). No results found.  Review of  Systems  HENT: Positive for hearing loss.   Respiratory: Positive for cough.   Musculoskeletal: Positive for joint pain.  Neurological: Positive for sensory change and weakness.  All other systems reviewed and are negative.   There were no vitals taken for this visit. Physical Exam  Constitutional: He is oriented to person, place, and time. He appears well-developed and well-nourished. No distress.  HENT:  Head: Normocephalic and atraumatic.  Nose: Nose normal.  Eyes: Conjunctivae and EOM are normal. Pupils are equal, round, and reactive to light.  Neck: Normal range of motion. Neck supple.  Cardiovascular: Normal rate and intact distal pulses.   Respiratory: Effort normal. No respiratory distress. He has no wheezes.  GI: Soft. He exhibits no distension. There is no tenderness.  Musculoskeletal:       Right knee: He exhibits decreased range of motion and swelling. He exhibits no LCL  laxity and no MCL laxity. Tenderness found. Medial joint line tenderness noted.  Lymphadenopathy:    He has no cervical adenopathy.  Neurological: He is alert and oriented to person, place, and time.  Skin: Skin is warm and dry. No rash noted. No erythema.  Psychiatric: He has a normal mood and affect. His behavior is normal.     Assessment/Plan Right knee medial meniscus tear moderate OA  This is certainly not an end stage knee. He is a candidate for an arthroscopic evaluation. The risks and benefits were discussed in detail. Severe sleep apnea, this would have to be done at [facility] with a possible overnight for pain medication.   Chriss Czar 05/09/2015, 11:31 AM

## 2015-05-12 ENCOUNTER — Encounter (HOSPITAL_BASED_OUTPATIENT_CLINIC_OR_DEPARTMENT_OTHER)
Admission: RE | Admit: 2015-05-12 | Discharge: 2015-05-12 | Disposition: A | Discharge status: Home / Self Care | Payer: Medicare Other | Source: Ambulatory Visit | Attending: Orthopedic Surgery | Admitting: Orthopedic Surgery

## 2015-05-12 ENCOUNTER — Encounter: Payer: Self-pay | Admitting: Nurse Practitioner

## 2015-05-12 ENCOUNTER — Ambulatory Visit (INDEPENDENT_AMBULATORY_CARE_PROVIDER_SITE_OTHER): Payer: Medicare Other | Admitting: Nurse Practitioner

## 2015-05-12 VITALS — BP 124/72 | HR 76 | Ht 70.0 in | Wt 283.4 lb

## 2015-05-12 DIAGNOSIS — Z882 Allergy status to sulfonamides status: Secondary | ICD-10-CM | POA: Diagnosis not present

## 2015-05-12 DIAGNOSIS — M2241 Chondromalacia patellae, right knee: Secondary | ICD-10-CM | POA: Diagnosis not present

## 2015-05-12 DIAGNOSIS — Z880 Allergy status to penicillin: Secondary | ICD-10-CM | POA: Diagnosis not present

## 2015-05-12 DIAGNOSIS — I452 Bifascicular block: Secondary | ICD-10-CM | POA: Diagnosis not present

## 2015-05-12 DIAGNOSIS — Z6839 Body mass index (BMI) 39.0-39.9, adult: Secondary | ICD-10-CM | POA: Diagnosis not present

## 2015-05-12 DIAGNOSIS — I2510 Atherosclerotic heart disease of native coronary artery without angina pectoris: Secondary | ICD-10-CM

## 2015-05-12 DIAGNOSIS — K219 Gastro-esophageal reflux disease without esophagitis: Secondary | ICD-10-CM | POA: Diagnosis not present

## 2015-05-12 DIAGNOSIS — M23221 Derangement of posterior horn of medial meniscus due to old tear or injury, right knee: Secondary | ICD-10-CM | POA: Diagnosis not present

## 2015-05-12 DIAGNOSIS — G4730 Sleep apnea, unspecified: Secondary | ICD-10-CM | POA: Diagnosis not present

## 2015-05-12 DIAGNOSIS — I10 Essential (primary) hypertension: Secondary | ICD-10-CM | POA: Diagnosis not present

## 2015-05-12 DIAGNOSIS — Z885 Allergy status to narcotic agent status: Secondary | ICD-10-CM | POA: Diagnosis not present

## 2015-05-12 DIAGNOSIS — Z87891 Personal history of nicotine dependence: Secondary | ICD-10-CM | POA: Diagnosis not present

## 2015-05-12 DIAGNOSIS — Z01818 Encounter for other preprocedural examination: Secondary | ICD-10-CM | POA: Diagnosis not present

## 2015-05-12 DIAGNOSIS — M1711 Unilateral primary osteoarthritis, right knee: Secondary | ICD-10-CM | POA: Diagnosis not present

## 2015-05-12 LAB — BASIC METABOLIC PANEL
ANION GAP: 7 (ref 5–15)
BUN: 17 mg/dL (ref 6–20)
CALCIUM: 9.3 mg/dL (ref 8.9–10.3)
CO2: 27 mmol/L (ref 22–32)
CREATININE: 0.86 mg/dL (ref 0.61–1.24)
Chloride: 103 mmol/L (ref 101–111)
GLUCOSE: 92 mg/dL (ref 65–99)
Potassium: 4 mmol/L (ref 3.5–5.1)
Sodium: 137 mmol/L (ref 135–145)

## 2015-05-12 MED ORDER — SIMVASTATIN 20 MG PO TABS: 20.0000 mg | ORAL_TABLET | Freq: Every day | ORAL | Status: DC

## 2015-05-12 NOTE — Progress Notes (Signed)
CARDIOLOGY OFFICE NOTE  Date:  05/12/2015    [name] [name] Date of Birth: Feb 10, 1946 Medical Record [id number]  PCP:  [name] Smolder, MD  Cardiologist:  [name] [name]    Chief Complaint  Patient presents with  . Coronary Artery Disease    Surgical clearance - seen for Dr. Harrington [name].   . Pre-op Exam    History of Present Illness: [name] [name] is a 69 y.o. male who presents today for a pre op clearance visit. Seen for Dr. Harrington [name]. He has a history of nonobstructive CAD by cardiac catheterization in 3/10. At that time he had a mid LAD 30% and proximal RCA 30% with an EF of 55-60%. Myoview prior to his cardiac catheterization 2/10 demonstrated inferior scar with mild to moderate peri-infarct ischemia. Echocardiogram 3/03 demonstrated normal LV function. His other medical history includes hypertension, hyperlipidemia, hypothyroidism, depression, testosterone deficiency, obstructive sleep apnea and degenerative joint disease.   He was last seen in January - felt to be doing ok. Got his echo updated. This was ok. Normal LV function. Mild LVH noted.   Comes back today. Here with his wife. He is limping coming in the office. He is to have right knee arthroscopy chondroplasty, medial menisectomy per Dr. French [name].  This is scheduled for later this week. Already off of his aspirin. He remains short of breath - this is not changed. He is pretty sedentary due to back issues/arthritis/neck pain. No chest pain whatsoever. No syncope. Tolerating his medicines. Labs by PCP.   Past Medical History  Diagnosis Date  . Observed sleep apnea     can't wear cpap  . HTN (hypertension)   . GERD (gastroesophageal reflux disease)   . Dyspnea     -PFTs compeltely normal 03/20/08 including DLc0  . Sleep apnea     Most recent sleep study 2010; records at [facility] office  . CAD (coronary artery disease)     cath 3/10: mLAD 30%, pRCA 30%, EF 55-60% Dr. Harrington [name] cardiologist  most recent  stress stress done ~ 2 years ago  with Dr. Harrington [name]  . Hypothyroidism   . Low testosterone   . Arthritis   . Depression   . Polycythemia   . PONV (postoperative nausea and vomiting)   . Complication of anesthesia     "he usually gets an ileus after back OR"  . Pulmonary nodule 06/13/2012  . Rosacea     Past Surgical History  Procedure Laterality Date  . Cataract extraction, bilateral  2010  . Meniscus repair  08/2008    left  . Tonsillectomy  09/21/11  . Uvulopalatopharyngoplasty, tonsillectomy and septoplasty  09/21/11  . Posterior fusion lumbar spine  2005; 01/2007; 04/2009    L5; L3-4; L2-3  . Lumbar disc surgery  06/2006    L3  . Back surgery  2005; 06/2006; 01/2007; 04/2009  . Prostate surgery  2000  . Transurethral resection of prostate  2006    followed by "surgery to get rid of clots"  . Shoulder open rotator cuff repair      left  . Cervical fusion  04/2002    ?C3-4  . Total knee arthroplasty  01/2011    left  . Cardiac catheterization  08/2008  . Esophagogastroduodenoscopy  multiple    last 07/2012 GERD esophagitis, 48 Fr dilation  . Colonoscopy       Medications: Current Outpatient Prescriptions  Medication Sig Dispense Refill  . citalopram (CELEXA) 20 MG tablet Take 20 mg by mouth  daily.    . hydrochlorothiazide (HYDRODIURIL) 25 MG tablet Take 25 mg by mouth daily.     [name] levothyroxine (SYNTHROID, LEVOTHROID) 150 MCG tablet Take 150 mcg by mouth daily before breakfast.    . losartan (COZAAR) 50 MG tablet Take 50 mg by mouth daily.    [name] omeprazole (PRILOSEC) 40 MG capsule Take 1 capsule (40 mg total) by mouth daily before breakfast. 30 minutes before 90 capsule 3  . polyethylene glycol (MIRALAX) packet Take 17 g by mouth daily.      . simvastatin (ZOCOR) 20 MG tablet Take 1 tablet (20 mg total) by mouth daily at 6 PM. 90 tablet 3  . Trospium Chloride 60 MG CP24 Take 60 mg by mouth daily.    [name] aspirin 81 MG tablet Take 2 tabs daily    . ibuprofen (ADVIL,MOTRIN) 200 MG tablet Take 200 mg by mouth at  bedtime.     No current facility-administered medications for this visit.    Allergies: Allergies  Allergen Reactions  . Penicillins Hives and Other (See Comments)    "I break out in welts"  . Codeine Nausea And Vomiting  . Sulfonamide Derivatives Nausea And Vomiting    Social History: The patient  reports that he quit smoking about 40 years ago. His smoking use included Cigarettes. He has a 27 pack-year smoking history. He has never used smokeless tobacco. He reports that he does not drink alcohol or use illicit drugs.   Family History: The patient's family history includes Alzheimer's disease in his father; Breast cancer in his mother and sister; Colon polyps in his mother; Diabetes in his mother and sister; Heart Problems in his sister; Heart attack in his father; Liver cancer in his maternal uncle; Lung cancer in his paternal uncle; Throat cancer in his maternal uncle. There is no history of Anesthesia problems.   Review of Systems: Please see the history of present illness.   Otherwise, the review of systems is positive for none.   All other systems are reviewed and negative.   Physical Exam: VS:  BP 124/72 mmHg  Pulse 76  Ht 5\' 10"  (1.778 m)  Wt 283 lb 6.4 oz (128.549 kg)  BMI 40.66 kg/m2 .  BMI Body mass index is 40.66 kg/(m^2).  Wt Readings from Last 3 Encounters:  05/12/15 283 lb 6.4 oz (128.549 kg)  05/08/15 278 lb (126.1 kg)  07/08/14 295 lb 3.2 oz (133.902 kg)    General: Pleasant. Obese male who is alert and in no acute distress.  HEENT: Normal. Neck: Supple, no JVD, carotid bruits, or masses noted.  Cardiac: Regular rate and rhythm. No murmurs, rubs, or gallops. No edema.  Respiratory:  Lungs are clear to auscultation bilaterally with normal work of breathing.  GI: Soft and nontender.  MS: No deformity or atrophy. Gait and ROM intact. Skin: Warm and dry. Color is normal.  Neuro:  Strength and sensation are intact and no gross focal deficits noted.  Psych:  Alert, appropriate and with normal affect.   LABORATORY DATA:  EKG:  EKG is ordered today. This demonstrates NSR with RBBB and LAFB. Reviewed with Dr. Harrington [name].   Lab Results  Component Value Date   WBC 5.6 12/11/2013   HGB 14.8 12/11/2013   HCT 45.2 12/11/2013   PLT 231 12/11/2013   GLUCOSE 106 12/11/2013   ALT 32 12/11/2013   AST 27 12/11/2013   NA 136 12/11/2013   K 4.0 12/11/2013   CL 102 12/12/2012   CREATININE  0.9 12/11/2013   BUN 14.3 12/11/2013   CO2 25 12/11/2013   TSH 3.706 02/23/2011   INR 0.99 02/10/2011    BNP (last 3 results) No results for input(s): BNP in the last 8760 hours.  ProBNP (last 3 results) No results for input(s): PROBNP in the last 8760 hours.   Other Studies Reviewed Today:  Echo Study Conclusions from 06/2014  - Left ventricle: The cavity size was normal. There was mild concentric hypertrophy. The estimated ejection fraction was 55%. - Atrial septum: No defect or patent foramen ovale was identified.   ANGIOGRAM FROM 2010 Left main was normal.  LAD was a long vessel, wrapping the apex, gave off to moderate-sized diagonals. There is a long 30% tubular lesion in the midsection.  Left circumflex gave off a large ramus and 2 small OMs. They were angiographically normal.  Right coronary artery was a large dominant vessel, gave off a PDA and 2 posterolaterals. There is a 30% lesion in the proximal RCA.  Left ventriculogram done in the RAO position showed an EF of 55-60%. There was some question of a mild wall motion defect in the mid anterior wall. Otherwise, I suspect this is just related to ectopy.  ASSESSMENT: 1. Mild nonobstructive coronary artery disease. 2. Normal left ventricular function.  PLAN: Will be for medical therapy with continued risk factor management. He will follow up with Dr. Harrington [name].   Shaune Pascal. Bensimhon, MD Electronically Signed  DRB/MEDQ D: 08/26/2008 T: 08/26/2008  Job: [id number]     Assessment/Plan: 1. CAD - MIld/nonobstructive disease by cath in 2010 - no symptoms reported.   2. HTN- BP ok on current regimen.   3. HLD on statin therapy. Labs from PCP. I sent in his refill today.   4. Obesity - needs to be more active - unfortunately, I do not see this improving.   5. R BBB with LAFB - no symptoms reported. No syncope.   6. Pre op clearance - he is felt to be a satisfactory candidate for his surgery from our standpoint. Discussed with Dr. Harrington [name] who is in agreement. We will see back in one year unless needed sooner. Will be available as needed.   Current medicines are reviewed with the patient today.  The patient does not have concerns regarding medicines other than what has been noted above.  The following changes have been made:  See above.  Labs/ tests ordered today include:    Orders Placed This Encounter  Procedures  . EKG 12-Lead     Disposition:   FU with Dr. Harrington [name] as planned.   Patient is agreeable to this plan and will call if any problems develop in the interim.   Signed: Burtis Junes, RN, ANP-C 05/12/2015 1:57 PM  [facility] [address] [place], [place]  [address] Phone: [phone] Fax: [phone]

## 2015-05-12 NOTE — Patient Instructions (Addendum)
We will be checking the following labs today - NONE   Medication Instructions:    Continue with your current medicines.  I refilled your cholesterol medicine today to your mail order     Testing/Procedures To Be Arranged:  N/A  Follow-Up:   See Dr. Harrington Challenger next November of 2017 - sooner if having any issues.     Other Special Instructions:   N/A    If you need a refill on your cardiac medications before your next appointment, please call your pharmacy.   Call the [place] office at [phone] if you have any questions, problems or concerns.

## 2015-05-14 ENCOUNTER — Ambulatory Visit (HOSPITAL_BASED_OUTPATIENT_CLINIC_OR_DEPARTMENT_OTHER)
Admission: RE | Admit: 2015-05-14 | Discharge: 2015-05-15 | Disposition: A | Discharge status: Home / Self Care | Payer: Medicare Other | Source: Ambulatory Visit | Attending: Orthopedic Surgery | Admitting: Orthopedic Surgery

## 2015-05-14 ENCOUNTER — Ambulatory Visit (HOSPITAL_BASED_OUTPATIENT_CLINIC_OR_DEPARTMENT_OTHER): Payer: Medicare Other | Admitting: Certified Registered"

## 2015-05-14 ENCOUNTER — Encounter (HOSPITAL_BASED_OUTPATIENT_CLINIC_OR_DEPARTMENT_OTHER)
Admission: RE | Disposition: A | Discharge status: Home / Self Care | Payer: Self-pay | Source: Ambulatory Visit | Attending: Orthopedic Surgery

## 2015-05-14 ENCOUNTER — Encounter (HOSPITAL_BASED_OUTPATIENT_CLINIC_OR_DEPARTMENT_OTHER): Payer: Self-pay | Admitting: *Deleted

## 2015-05-14 DIAGNOSIS — I10 Essential (primary) hypertension: Secondary | ICD-10-CM | POA: Diagnosis not present

## 2015-05-14 DIAGNOSIS — M23221 Derangement of posterior horn of medial meniscus due to old tear or injury, right knee: Secondary | ICD-10-CM | POA: Diagnosis not present

## 2015-05-14 DIAGNOSIS — Z87891 Personal history of nicotine dependence: Secondary | ICD-10-CM | POA: Insufficient documentation

## 2015-05-14 DIAGNOSIS — Z885 Allergy status to narcotic agent status: Secondary | ICD-10-CM | POA: Insufficient documentation

## 2015-05-14 DIAGNOSIS — M2241 Chondromalacia patellae, right knee: Secondary | ICD-10-CM | POA: Insufficient documentation

## 2015-05-14 DIAGNOSIS — Z880 Allergy status to penicillin: Secondary | ICD-10-CM | POA: Insufficient documentation

## 2015-05-14 DIAGNOSIS — Z882 Allergy status to sulfonamides status: Secondary | ICD-10-CM | POA: Insufficient documentation

## 2015-05-14 DIAGNOSIS — G4730 Sleep apnea, unspecified: Secondary | ICD-10-CM | POA: Insufficient documentation

## 2015-05-14 DIAGNOSIS — K219 Gastro-esophageal reflux disease without esophagitis: Secondary | ICD-10-CM | POA: Insufficient documentation

## 2015-05-14 DIAGNOSIS — Z6839 Body mass index (BMI) 39.0-39.9, adult: Secondary | ICD-10-CM | POA: Insufficient documentation

## 2015-05-14 DIAGNOSIS — M1711 Unilateral primary osteoarthritis, right knee: Secondary | ICD-10-CM | POA: Diagnosis not present

## 2015-05-14 DIAGNOSIS — I2510 Atherosclerotic heart disease of native coronary artery without angina pectoris: Secondary | ICD-10-CM | POA: Insufficient documentation

## 2015-05-14 HISTORY — PX: KNEE ARTHROSCOPY WITH MEDIAL MENISECTOMY: SHX5651

## 2015-05-14 SURGERY — ARTHROSCOPY, KNEE, WITH MEDIAL MENISCECTOMY
Anesthesia: General | Site: Knee | Laterality: Right

## 2015-05-14 MED ORDER — SODIUM CHLORIDE 0.9 % IV SOLN
INTRAVENOUS | Status: DC
Start: 2015-05-14 — End: 2015-05-15

## 2015-05-14 MED ORDER — BUPIVACAINE-EPINEPHRINE (PF) 0.5% -1:200000 IJ SOLN
INTRAMUSCULAR | Status: AC
  Filled 2015-05-14: qty 30

## 2015-05-14 MED ORDER — OXYCODONE HCL 5 MG PO TABS
5.0000 mg | ORAL_TABLET | Freq: Once | ORAL | Status: AC | PRN
  Administered 2015-05-14: 5 mg via ORAL
  Filled 2015-05-14: qty 1

## 2015-05-14 MED ORDER — ACETAMINOPHEN 10 MG/ML IV SOLN
INTRAVENOUS | Status: AC
  Filled 2015-05-14: qty 100

## 2015-05-14 MED ORDER — LIDOCAINE HCL (CARDIAC) 20 MG/ML IV SOLN
INTRAVENOUS | Status: AC
  Filled 2015-05-14: qty 5

## 2015-05-14 MED ORDER — GLYCOPYRROLATE 0.2 MG/ML IJ SOLN: 0.2000 mg | Freq: Once | INTRAMUSCULAR | Status: DC | PRN

## 2015-05-14 MED ORDER — FENTANYL CITRATE (PF) 100 MCG/2ML IJ SOLN
INTRAMUSCULAR | Status: AC
  Filled 2015-05-14: qty 2

## 2015-05-14 MED ORDER — DEXAMETHASONE SODIUM PHOSPHATE 4 MG/ML IJ SOLN
INTRAMUSCULAR | Status: DC | PRN
  Administered 2015-05-14: 10 mg via INTRAVENOUS

## 2015-05-14 MED ORDER — MEPERIDINE HCL 25 MG/ML IJ SOLN: 6.2500 mg | INTRAMUSCULAR | Status: DC | PRN

## 2015-05-14 MED ORDER — SODIUM CHLORIDE 0.9 % IR SOLN
Status: DC | PRN
  Administered 2015-05-14: 6000 mL

## 2015-05-14 MED ORDER — PROPOFOL 10 MG/ML IV BOLUS
INTRAVENOUS | Status: DC | PRN
  Administered 2015-05-14: 150 mg via INTRAVENOUS

## 2015-05-14 MED ORDER — VANCOMYCIN HCL IN DEXTROSE 1-5 GM/200ML-% IV SOLN
INTRAVENOUS | Status: AC
  Filled 2015-05-14: qty 200

## 2015-05-14 MED ORDER — HYDROMORPHONE HCL 1 MG/ML IJ SOLN: 0.2500 mg | INTRAMUSCULAR | Status: DC | PRN

## 2015-05-14 MED ORDER — LACTATED RINGERS IV SOLN
INTRAVENOUS | Status: DC
  Administered 2015-05-14 (×2): via INTRAVENOUS

## 2015-05-14 MED ORDER — SODIUM CHLORIDE 0.9 % IV SOLN
INTRAVENOUS | Status: DC
  Administered 2015-05-14: 16:00:00 via INTRAVENOUS

## 2015-05-14 MED ORDER — METOCLOPRAMIDE HCL 5 MG PO TABS: 5.0000 mg | ORAL_TABLET | Freq: Three times a day (TID) | ORAL | Status: DC | PRN

## 2015-05-14 MED ORDER — EPINEPHRINE HCL 1 MG/ML IJ SOLN
INTRAMUSCULAR | Status: DC | PRN
  Administered 2015-05-14: 2 mg

## 2015-05-14 MED ORDER — FENTANYL CITRATE (PF) 100 MCG/2ML IJ SOLN
50.0000 ug | INTRAMUSCULAR | Status: DC | PRN
  Administered 2015-05-14: 50 ug via INTRAVENOUS
  Administered 2015-05-14: 25 ug via INTRAVENOUS

## 2015-05-14 MED ORDER — DEXAMETHASONE SODIUM PHOSPHATE 10 MG/ML IJ SOLN
INTRAMUSCULAR | Status: AC
  Filled 2015-05-14: qty 1

## 2015-05-14 MED ORDER — VANCOMYCIN HCL IN DEXTROSE 1-5 GM/200ML-% IV SOLN
1000.0000 mg | INTRAVENOUS | Status: AC
  Administered 2015-05-14: 1000 mg via INTRAVENOUS

## 2015-05-14 MED ORDER — EPINEPHRINE HCL 1 MG/ML IJ SOLN
INTRAMUSCULAR | Status: AC
  Filled 2015-05-14: qty 1

## 2015-05-14 MED ORDER — ONDANSETRON HCL 4 MG PO TABS: 4.0000 mg | ORAL_TABLET | Freq: Four times a day (QID) | ORAL | Status: DC | PRN

## 2015-05-14 MED ORDER — CHLORHEXIDINE GLUCONATE 4 % EX LIQD: 60.0000 mL | Freq: Once | CUTANEOUS | Status: DC

## 2015-05-14 MED ORDER — METOCLOPRAMIDE HCL 5 MG/ML IJ SOLN: 5.0000 mg | Freq: Three times a day (TID) | INTRAMUSCULAR | Status: DC | PRN

## 2015-05-14 MED ORDER — LIDOCAINE HCL (CARDIAC) 20 MG/ML IV SOLN
INTRAVENOUS | Status: DC | PRN
  Administered 2015-05-14: 60 mg via INTRAVENOUS

## 2015-05-14 MED ORDER — ONDANSETRON HCL 4 MG/2ML IJ SOLN
INTRAMUSCULAR | Status: AC
  Filled 2015-05-14: qty 2

## 2015-05-14 MED ORDER — HYDROCODONE-ACETAMINOPHEN 7.5-325 MG PO TABS
1.0000 | ORAL_TABLET | ORAL | Status: DC | PRN
  Administered 2015-05-15: 1 via ORAL

## 2015-05-14 MED ORDER — MIDAZOLAM HCL 2 MG/2ML IJ SOLN: 1.0000 mg | INTRAMUSCULAR | Status: DC | PRN

## 2015-05-14 MED ORDER — ONDANSETRON HCL 4 MG/2ML IJ SOLN: 4.0000 mg | Freq: Four times a day (QID) | INTRAMUSCULAR | Status: DC | PRN

## 2015-05-14 MED ORDER — ONDANSETRON HCL 4 MG/2ML IJ SOLN
INTRAMUSCULAR | Status: DC | PRN
  Administered 2015-05-14: 4 mg via INTRAVENOUS

## 2015-05-14 MED ORDER — DOCUSATE SODIUM 100 MG PO CAPS
100.0000 mg | ORAL_CAPSULE | Freq: Two times a day (BID) | ORAL | Status: DC
  Administered 2015-05-14: 100 mg via ORAL
  Filled 2015-05-14: qty 1

## 2015-05-14 MED ORDER — PROPOFOL 10 MG/ML IV BOLUS
INTRAVENOUS | Status: AC
  Filled 2015-05-14: qty 20

## 2015-05-14 MED ORDER — METHYLPREDNISOLONE ACETATE 80 MG/ML IJ SUSP
INTRAMUSCULAR | Status: AC
  Filled 2015-05-14: qty 1

## 2015-05-14 MED ORDER — ACETAMINOPHEN 10 MG/ML IV SOLN
1000.0000 mg | Freq: Once | INTRAVENOUS | Status: AC
  Administered 2015-05-14: 1000 mg via INTRAVENOUS

## 2015-05-14 MED ORDER — SCOPOLAMINE 1 MG/3DAYS TD PT72: 1.0000 | MEDICATED_PATCH | Freq: Once | TRANSDERMAL | Status: DC | PRN

## 2015-05-14 MED ORDER — OXYCODONE HCL 5 MG/5ML PO SOLN: 5.0000 mg | Freq: Once | ORAL | Status: AC | PRN

## 2015-05-14 MED ORDER — VANCOMYCIN HCL IN DEXTROSE 1-5 GM/200ML-% IV SOLN
1000.0000 mg | Freq: Two times a day (BID) | INTRAVENOUS | Status: AC
  Administered 2015-05-15: 1000 mg via INTRAVENOUS

## 2015-05-14 MED ORDER — HYDROMORPHONE HCL 1 MG/ML IJ SOLN: 0.5000 mg | INTRAMUSCULAR | Status: DC | PRN

## 2015-05-14 SURGICAL SUPPLY — 41 items
BANDAGE ELASTIC 6 VELCRO ST LF (GAUZE/BANDAGES/DRESSINGS) ×2 IMPLANT
BANDAGE ESMARK 6X9 LF (GAUZE/BANDAGES/DRESSINGS) IMPLANT
BLADE 4.2CUDA (BLADE) ×2 IMPLANT
BLADE CUDA GRT WHITE 3.5 (BLADE) ×2 IMPLANT
BNDG CMPR 9X6 STRL LF SNTH (GAUZE/BANDAGES/DRESSINGS)
BNDG ESMARK 6X9 LF (GAUZE/BANDAGES/DRESSINGS)
BNDG GAUZE ELAST 4 BULKY (GAUZE/BANDAGES/DRESSINGS) ×3 IMPLANT
BRUSH SCRUB EZ PLAIN DRY (MISCELLANEOUS) ×3 IMPLANT
DRAPE ARTHROSCOPY W/POUCH 90 (DRAPES) ×3 IMPLANT
DRSG EMULSION OIL 3X3 NADH (GAUZE/BANDAGES/DRESSINGS) ×3 IMPLANT
DURAPREP 26ML APPLICATOR (WOUND CARE) ×3 IMPLANT
GAUZE SPONGE 4X4 12PLY STRL (GAUZE/BANDAGES/DRESSINGS) ×3 IMPLANT
GLOVE BIO SURGEON STRL SZ 6.5 (GLOVE) ×1 IMPLANT
GLOVE BIO SURGEON STRL SZ7.5 (GLOVE) ×3 IMPLANT
GLOVE BIO SURGEONS STRL SZ 6.5 (GLOVE) ×1
GLOVE BIOGEL PI IND STRL 7.0 (GLOVE) IMPLANT
GLOVE BIOGEL PI IND STRL 8 (GLOVE) ×2 IMPLANT
GLOVE BIOGEL PI INDICATOR 7.0 (GLOVE) ×4
GLOVE BIOGEL PI INDICATOR 8 (GLOVE) ×4
GLOVE SURG ORTHO 8.0 STRL STRW (GLOVE) ×3 IMPLANT
GOWN STRL REUS W/ TWL LRG LVL3 (GOWN DISPOSABLE) ×1 IMPLANT
GOWN STRL REUS W/ TWL XL LVL3 (GOWN DISPOSABLE) ×1 IMPLANT
GOWN STRL REUS W/TWL LRG LVL3 (GOWN DISPOSABLE) ×3
GOWN STRL REUS W/TWL XL LVL3 (GOWN DISPOSABLE) ×3
HOLDER KNEE FOAM BLUE (MISCELLANEOUS) ×1 IMPLANT
KNEE WRAP E Z 3 GEL PACK (MISCELLANEOUS) ×3 IMPLANT
MANIFOLD NEPTUNE II (INSTRUMENTS) ×2 IMPLANT
NDL SAFETY ECLIPSE 18X1.5 (NEEDLE) ×1 IMPLANT
NEEDLE HYPO 18GX1.5 SHARP (NEEDLE) ×6
PACK ARTHROSCOPY DSU (CUSTOM PROCEDURE TRAY) ×3 IMPLANT
PACK BASIN DAY SURGERY FS (CUSTOM PROCEDURE TRAY) ×3 IMPLANT
SET ARTHROSCOPY TUBING (MISCELLANEOUS) ×3
SET ARTHROSCOPY TUBING LN (MISCELLANEOUS) ×1 IMPLANT
SUT ETHILON 4 0 PS 2 18 (SUTURE) ×3 IMPLANT
SYR 5ML LL (SYRINGE) ×3 IMPLANT
TOWEL OR 17X24 6PK STRL BLUE (TOWEL DISPOSABLE) ×3 IMPLANT
TOWEL OR NON WOVEN STRL DISP B (DISPOSABLE) ×3 IMPLANT
WAND 3.0 CAPSURE 30 DEG W/CORD (SURGICAL WAND) IMPLANT
WAND 30 DEG SABER W/CORD (SURGICAL WAND) IMPLANT
WAND STAR VAC 90 (SURGICAL WAND) IMPLANT
WATER STERILE IRR 1000ML POUR (IV SOLUTION) ×3 IMPLANT

## 2015-05-14 NOTE — Anesthesia Procedure Notes (Signed)
Procedure Name: LMA Insertion Date/Time: 05/14/2015 2:24 PM Performed by: Jajuan Skoog D Pre-anesthesia Checklist: Patient identified, Emergency Drugs available, Suction available and Patient being monitored Patient Re-evaluated:Patient Re-evaluated prior to inductionOxygen Delivery Method: Circle System Utilized Preoxygenation: Pre-oxygenation with 100% oxygen Intubation Type: IV induction Ventilation: Mask ventilation without difficulty LMA: LMA inserted LMA Size: 4.0 Number of attempts: 1 Airway Equipment and Method: Bite block Placement Confirmation: positive ETCO2 Tube secured with: Tape Dental Injury: Teeth and Oropharynx as per pre-operative assessment

## 2015-05-14 NOTE — Anesthesia Preprocedure Evaluation (Addendum)
Anesthesia Evaluation    Airway Mallampati: II  TM Distance: >3 FB Neck ROM: Full    Dental  (+) Teeth Intact, Dental Advisory Given   Pulmonary sleep apnea (Pt does not tolerate CPAP) , former smoker,    breath sounds clear to auscultation       Cardiovascular hypertension, + CAD   Rhythm:Regular Rate:Normal     Neuro/Psych    GI/Hepatic GERD  Medicated and Controlled,  Endo/Other  Morbid obesity  Renal/GU      Musculoskeletal   Abdominal   Peds  Hematology   Anesthesia Other Findings   Reproductive/Obstetrics                          Anesthesia Physical Anesthesia Plan  ASA: III  Anesthesia Plan: General   Post-op Pain Management:    Induction: Intravenous  Airway Management Planned: LMA  Additional Equipment:   Intra-op Plan:   Post-operative Plan: Extubation in OR  Informed Consent: I have reviewed the patients History and Physical, chart, labs and discussed the procedure including the risks, benefits and alternatives for the proposed anesthesia with the patient or authorized representative who has indicated his/her understanding and acceptance.   Dental advisory given  Plan Discussed with: CRNA, Anesthesiologist and Surgeon  Anesthesia Plan Comments:         Anesthesia Quick Evaluation

## 2015-05-14 NOTE — Progress Notes (Signed)
Gave report to Daniel, RN to assume care of patient in [facility] 

## 2015-05-14 NOTE — Anesthesia Postprocedure Evaluation (Signed)
  Anesthesia Post-op Note  Patient: [name] [name]  Procedure(s) Performed: Procedure(s): RIGHT KNEE ARTHROSCOPY CHONDROPLASTY, PARTIAL  MEDIAL MENISECTOMY (Right)  Patient Location: PACU  Anesthesia Type: General   Level of Consciousness: awake, alert  and oriented  Airway and Oxygen Therapy: Patient Spontanous Breathing  Post-op Pain: mild  Post-op Assessment: Post-op Vital signs reviewed  Post-op Vital Signs: Reviewed  Last Vitals:  Filed Vitals:   05/14/15 1530  BP:   Pulse: 77  Temp:   Resp: 15    Complications: No apparent anesthesia complications

## 2015-05-14 NOTE — Transfer of Care (Signed)
Immediate Anesthesia Transfer of Care Note  Patient: [name] [name]  Procedure(s) Performed: Procedure(s): RIGHT KNEE ARTHROSCOPY CHONDROPLASTY, PARTIAL  MEDIAL MENISECTOMY (Right)  Patient Location: PACU  Anesthesia Type:General  Level of Consciousness: awake and patient cooperative  Airway & Oxygen Therapy: Patient Spontanous Breathing and Patient connected to face mask oxygen  Post-op Assessment: Report given to RN and Post -op Vital signs reviewed and stable  Post vital signs: Reviewed and stable  Last Vitals:  Filed Vitals:   05/14/15 1515  BP:   Pulse: 77  Temp:   Resp: 18    Complications: No apparent anesthesia complications

## 2015-05-14 NOTE — Brief Op Note (Signed)
05/14/2015  3:27 PM  PATIENT:  [name] [name]  69 y.o. male  PRE-OPERATIVE DIAGNOSIS:  RIGHT KNEE OTHER TEAR OF MEDIAL [place] INJURY, UNSPECIFIED KNEE,INITIAL ENCOUNTER  POST-OPERATIVE DIAGNOSIS:  RIGHT KNEE OTHER TEAR OF MEDIAL MENISCUS,CURRENT  PROCEDURE:  Procedure(s): RIGHT KNEE ARTHROSCOPY CHONDROPLASTY, PARTIAL  MEDIAL MENISECTOMY (Right)  SURGEON:  Surgeon(s) and Role:    * [name], MD - Primary  PHYSICIAN ASSISTANT:   ASSISTANTS: none   ANESTHESIA:   general  EBL:  Total I/O In: 1600 [I.V.:1600] Out: -   BLOOD ADMINISTERED:none  DRAINS: none   LOCAL MEDICATIONS USED:  NONE  SPECIMEN:  No Specimen  DISPOSITION OF SPECIMEN:  N/A  COUNTS:  YES  TOURNIQUET:  * No tourniquets in log *  DICTATION: .Other Dictation: Dictation Number unknown  PLAN OF CARE: Admit for overnight observation  PATIENT DISPOSITION:  PACU - hemodynamically stable.   Delay start of Pharmacological VTE agent (>24hrs) due to surgical blood loss or risk of bleeding: not applicable

## 2015-05-14 NOTE — Discharge Instructions (Signed)
Diet: As you were doing prior to hospitalization  ° °Activity: Increase activity slowly as tolerated  °No lifting or driving for 48 hours  ° °Shower: May shower without a dressing on post op day #3, NO SOAKING in tub  ° °Dressing: You may change your dressing on post op day #3.  °Then change the dressing daily with sterile 4"x4"s gauze dressing  °Or band aids.  ° °Weight Bearing: weight bearing as tolerated. ° °To prevent constipation: you may use a stool softener such as -  °Colace ( over the counter) 100 mg by mouth twice a day  °Drink plenty of fluids ( prune juice may be helpful) and high fiber foods  °Miralax ( over the counter) for constipation as needed.  ° °Precautions: If you experience chest pain or shortness of breath - call 911 immediately For transfer to the hospital emergency department!!  °If you develop a fever greater that 101 F, purulent drainage from wound, increased redness or drainage from wound, or calf pain -- Call the office  ° °Follow- Up Appointment: Please call for an appointment to be seen in 1 week or as previously scheduled  °[place] - [phone] ° ° ° °Post Anesthesia Home Care Instructions ° °Activity: °Get plenty of rest for the remainder of the day. A responsible adult should stay with you for 24 hours following the procedure.  °For the next 24 hours, DO NOT: °-Drive a car °-Operate machinery °-Drink alcoholic beverages °-Take any medication unless instructed by your physician °-Make any legal decisions or sign important papers. ° °Meals: °Start with liquid foods such as gelatin or soup. Progress to regular foods as tolerated. Avoid greasy, spicy, heavy foods. If nausea and/or vomiting occur, drink only clear liquids until the nausea and/or vomiting subsides. Call your physician if vomiting continues. ° °Special Instructions/Symptoms: °Your throat may feel dry or sore from the anesthesia or the breathing tube placed in your throat during surgery. If this causes discomfort,  gargle with warm salt water. The discomfort should disappear within 24 hours. ° °If you had a scopolamine patch placed behind your ear for the management of post- operative nausea and/or vomiting: ° °1. The medication in the patch is effective for 72 hours, after which it should be removed.  Wrap patch in a tissue and discard in the trash. Wash hands thoroughly with soap and water. °2. You may remove the patch earlier than 72 hours if you experience unpleasant side effects which may include dry mouth, dizziness or visual disturbances. °3. Avoid touching the patch. Wash your hands with soap and water after contact with the patch. °  ° °

## 2015-05-15 ENCOUNTER — Encounter (HOSPITAL_BASED_OUTPATIENT_CLINIC_OR_DEPARTMENT_OTHER): Payer: Self-pay | Admitting: Orthopedic Surgery

## 2015-05-15 DIAGNOSIS — M23221 Derangement of posterior horn of medial meniscus due to old tear or injury, right knee: Secondary | ICD-10-CM | POA: Diagnosis not present

## 2015-05-15 MED ORDER — HYDROCODONE-ACETAMINOPHEN 7.5-325 MG/15ML PO SOLN
ORAL | Status: AC
  Filled 2015-05-15: qty 15

## 2015-05-15 NOTE — Op Note (Signed)
NAME:  [name] [name], [name] [name] NO.:  [id number]  MEDICAL RECORD NO.:  [id number]  LOCATION:                               FACILITY:  [facility]  PHYSICIAN:  Lockie Pares, M.D.    DATE OF BIRTH:  December 23, 1945  DATE OF PROCEDURE:  05/14/2015 DATE OF DISCHARGE:  05/15/2015                              OPERATIVE REPORT   INDICATIONS:  A 69 year old, MRI proven symptomatic medial meniscus tear with osteoarthritis thought to be amenable to outpatient surgery.  PREOPERATIVE DIAGNOSES:  Complex tear of posterior horn of medial meniscus, right knee, with addition of osteoarthritis, patellofemoral joint medial compartment.  POSTOPERATIVE DIAGNOSES:  Complex tear of posterior horn of medial meniscus, right knee, with addition of osteoarthritis, patellofemoral joint medial compartment.  OPERATION: 1. Partial medial meniscectomy. 2. Debridement of chondroplasty patellofemoral joint of medial     compartment.  SURGEON:  Lockie Pares, M.D.  ANESTHESIA:  General with local supplementation.  DESCRIPTION OF PROCEDURE:  Inferomedial inferolateral portals were made. Patellofemoral joint showed mild chondromalacia, which was debrided. The patellofemoral joint, lateral compartment, lateral meniscus were normal.  Complex tear of the posterior horn resected probably 50-60% of the meniscus substance, generalized grade 3 chondral damage was noted on the surface of the femur with less damage on the tibia, thoracic chondroplasty was carried out.  No grade 4 changes appreciated.  Again, partial meniscectomy with debridement of chondroplasty carried out. Knee drained free of fluid.  Portals were infiltrated with 15-20 mL 0.5% Marcaine with epinephrine with additional 8-10 mL intra-articularly with 80 mg Depo-Medrol intra-articularly.  Lightly compressive sterile dressing applied.  Taken to recovery room in stable condition.     Lockie Pares, M.D.     WDC/MEDQ  D:  05/14/2015  T:   05/15/2015  Job:  [id number]

## 2015-07-03 ENCOUNTER — Telehealth: Payer: Self-pay | Admitting: Internal Medicine

## 2015-07-03 NOTE — Telephone Encounter (Signed)
[facility] Found that surgical clearance form was received and will be reviewed with Dr. Harrington Challenger when she is back in the office on 07/07/15. Will call them if patient needs seen again prior to this surgery, otherwise, will forward the clearance form directly the the surgeon's office.

## 2015-07-03 NOTE — Telephone Encounter (Signed)
New Message  Pt wife calling to speak w/ RN- pt is having additional surgery- previously cleared by Dr Harrington Challenger- in Feb. Pt wife wanted to discuss. Please call back and discuss.

## 2015-07-11 ENCOUNTER — Telehealth: Payer: Self-pay | Admitting: *Deleted

## 2015-07-11 NOTE — Telephone Encounter (Signed)
Signed surgical clearance form placed in medical records bin to be faxed.

## 2015-07-15 ENCOUNTER — Ambulatory Visit: Payer: Self-pay | Admitting: Physician Assistant

## 2015-07-15 NOTE — H&P (Signed)
TOTAL KNEE ADMISSION H&P  Patient is being admitted for right total knee arthroplasty.  Subjective:  Chief Complaint:right knee pain.  HPI: [name] [name], 70 y.o. male, has a history of pain and functional disability in the right knee due to arthritis and has failed non-surgical conservative treatments for greater than 12 weeks to includeNSAID's and/or analgesics, corticosteriod injections, viscosupplementation injections, flexibility and strengthening excercises, use of assistive devices and activity modification.  Onset of symptoms was gradual, starting 6 years ago with gradually worsening course since that time. The patient noted prior procedures on the knee to include  arthroscopy and menisectomy on the right knee(s).  Patient currently rates pain in the right knee(s) at 10 out of 10 with activity. Patient has night pain, worsening of pain with activity and weight bearing, pain that interferes with activities of daily living, pain with passive range of motion, crepitus and joint swelling.  Patient has evidence of periarticular osteophytes and joint space narrowing by imaging studies. There is no active infection.  Patient Active Problem List   Diagnosis Date Noted  . Cough 12/10/2013  . Abnormality of gait 03/14/2013  . Pulmonary nodule 06/13/2012  . Pre-operative cardiovascular examination 01/27/2011  . HYPERLIPIDEMIA-MIXED 06/18/2009  . CAD, NATIVE VESSEL 06/18/2009  . OBSTRUCTIVE SLEEP APNEA 03/20/2008  . WEIGHT GAIN, ABNORMAL 03/20/2008  . DYSPNEA 03/20/2008  . HYPERTENSION 07/14/2007  . G E R D 07/14/2007   Past Medical History  Diagnosis Date  . Observed sleep apnea     can't wear cpap  . HTN (hypertension)   . GERD (gastroesophageal reflux disease)   . Dyspnea     -PFTs compeltely normal 03/20/08 including DLc0  . Sleep apnea     Most recent sleep study 2010; records at [facility] office  . CAD (coronary artery disease)     cath 3/10: mLAD 30%, pRCA 30%, EF 55-60% Dr.  Harrington Challenger cardiologist  most recent  stress stress done ~ 2 years ago with Dr. Harrington Challenger  . Hypothyroidism   . Low testosterone   . Arthritis   . Depression   . Polycythemia   . PONV (postoperative nausea and vomiting)   . Complication of anesthesia     "he usually gets an ileus after back OR"  . Pulmonary nodule 06/13/2012  . Rosacea     Past Surgical History  Procedure Laterality Date  . Cataract extraction, bilateral  2010  . Meniscus repair  08/2008    left  . Tonsillectomy  09/21/11  . Uvulopalatopharyngoplasty, tonsillectomy and septoplasty  09/21/11  . Posterior fusion lumbar spine  2005; 01/2007; 04/2009    L5; L3-4; L2-3  . Lumbar disc surgery  06/2006    L3  . Back surgery  2005; 06/2006; 01/2007; 04/2009  . Prostate surgery  2000  . Transurethral resection of prostate  2006    followed by "surgery to get rid of clots"  . Shoulder open rotator cuff repair      left  . Cervical fusion  04/2002    ?C3-4  . Total knee arthroplasty  01/2011    left  . Cardiac catheterization  08/2008  . Esophagogastroduodenoscopy  multiple    last 07/2012 GERD esophagitis, 48 Fr dilation  . Colonoscopy    . Knee arthroscopy with medial menisectomy Right 05/14/2015    Procedure: RIGHT KNEE ARTHROSCOPY CHONDROPLASTY, PARTIAL  MEDIAL MENISECTOMY;  Surgeon: [name], MD;  Location: [facility];  Service: Orthopedics;  Laterality: Right;     (Not  in a hospital admission) Allergies  Allergen Reactions  . Penicillins Hives and Other (See Comments)    "I break out in welts"  . Codeine Nausea And Vomiting  . Sulfonamide Derivatives Nausea And Vomiting    Social History  Substance Use Topics  . Smoking status: Former Smoker -- 1.50 packs/day for 18 years    Types: Cigarettes    Quit date: 06/28/1974  . Smokeless tobacco: Never Used  . Alcohol Use: No    Family History  Problem Relation Age of Onset  . Anesthesia problems Neg Hx   . Breast cancer Mother   . Colon polyps Mother    . Diabetes Mother   . Breast cancer Sister   . Diabetes Sister     borderline  . Heart Problems Sister   . Heart attack Father   . Alzheimer's disease Father   . Lung cancer Paternal Uncle     x4  . Throat cancer Maternal Uncle   . Liver cancer Maternal Uncle      Review of Systems  HENT: Positive for hearing loss.   Gastrointestinal: Positive for constipation.  Musculoskeletal: Positive for myalgias, back pain and joint pain.  Neurological: Positive for focal weakness.  All other systems reviewed and are negative.   Objective:  Physical Exam  Constitutional: He is oriented to person, place, and time. He appears well-developed and well-nourished. No distress.  HENT:  Head: Normocephalic and atraumatic.  Nose: Nose normal.  Eyes: Conjunctivae and EOM are normal. Pupils are equal, round, and reactive to light.  Neck: Normal range of motion. Neck supple.  Cardiovascular: Normal rate, regular rhythm, normal heart sounds and intact distal pulses.   Respiratory: Effort normal and breath sounds normal. No respiratory distress. He has no wheezes.  GI: Soft. Bowel sounds are normal. He exhibits no distension. There is no tenderness.  Musculoskeletal:       Right knee: He exhibits decreased range of motion and swelling. He exhibits no LCL laxity and no MCL laxity. Tenderness found. Medial joint line tenderness noted.  Lymphadenopathy:    He has no cervical adenopathy.  Neurological: He is alert and oriented to person, place, and time. No cranial nerve deficit.  Skin: Skin is warm and dry. No rash noted. No erythema.  Psychiatric: He has a normal mood and affect. His behavior is normal.    Vital signs in last 24 hours: @VSRANGES @  Labs:   Estimated body mass index is 40.66 kg/(m^2) as calculated from the following:   Height as of 05/14/15: 5\' 10"  (1.778 m).   Weight as of 05/12/15: 128.549 kg (283 lb 6.4 oz).   Imaging Review Plain radiographs demonstrate moderate  degenerative joint disease of the right knee(s). The overall alignment ismild varus. The bone quality appears to be good for age and reported activity level.  Assessment/Plan:  End stage arthritis, right knee   The patient history, physical examination, clinical judgment of the provider and imaging studies are consistent with end stage degenerative joint disease of the right knee(s) and total knee arthroplasty is deemed medically necessary. The treatment options including medical management, injection therapy arthroscopy and arthroplasty were discussed at length. The risks and benefits of total knee arthroplasty were presented and reviewed. The risks due to aseptic loosening, infection, stiffness, patella tracking problems, thromboembolic complications and other imponderables were discussed. The patient acknowledged the explanation, agreed to proceed with the plan and consent was signed. Patient is being admitted for inpatient treatment for surgery, pain control, PT,  OT, prophylactic antibiotics, VTE prophylaxis, progressive ambulation and ADL's and discharge planning. The patient is planning to be discharged to skilled nursing facility prefers [facility].

## 2015-07-17 ENCOUNTER — Encounter: Payer: Self-pay | Admitting: Internal Medicine

## 2015-07-29 ENCOUNTER — Encounter (HOSPITAL_COMMUNITY)
Admission: RE | Admit: 2015-07-29 | Discharge: 2015-07-29 | Disposition: A | Discharge status: Home / Self Care | Payer: Medicare Other | Source: Ambulatory Visit | Attending: Orthopedic Surgery | Admitting: Orthopedic Surgery

## 2015-07-29 ENCOUNTER — Encounter (HOSPITAL_COMMUNITY): Payer: Self-pay

## 2015-07-29 ENCOUNTER — Other Ambulatory Visit: Payer: Self-pay

## 2015-07-29 ENCOUNTER — Encounter (HOSPITAL_COMMUNITY)
Admission: RE | Admit: 2015-07-29 | Discharge: 2015-07-29 | Disposition: A | Discharge status: Home / Self Care | Payer: Medicare Other | Source: Ambulatory Visit | Attending: Physician Assistant | Admitting: Physician Assistant

## 2015-07-29 DIAGNOSIS — I2510 Atherosclerotic heart disease of native coronary artery without angina pectoris: Secondary | ICD-10-CM | POA: Insufficient documentation

## 2015-07-29 DIAGNOSIS — Z79899 Other long term (current) drug therapy: Secondary | ICD-10-CM | POA: Diagnosis not present

## 2015-07-29 DIAGNOSIS — I10 Essential (primary) hypertension: Secondary | ICD-10-CM | POA: Diagnosis not present

## 2015-07-29 DIAGNOSIS — Z87891 Personal history of nicotine dependence: Secondary | ICD-10-CM | POA: Insufficient documentation

## 2015-07-29 DIAGNOSIS — K219 Gastro-esophageal reflux disease without esophagitis: Secondary | ICD-10-CM | POA: Insufficient documentation

## 2015-07-29 DIAGNOSIS — Z7982 Long term (current) use of aspirin: Secondary | ICD-10-CM | POA: Insufficient documentation

## 2015-07-29 DIAGNOSIS — Z01812 Encounter for preprocedural laboratory examination: Secondary | ICD-10-CM | POA: Diagnosis not present

## 2015-07-29 DIAGNOSIS — Z0183 Encounter for blood typing: Secondary | ICD-10-CM | POA: Diagnosis not present

## 2015-07-29 DIAGNOSIS — M1711 Unilateral primary osteoarthritis, right knee: Secondary | ICD-10-CM | POA: Insufficient documentation

## 2015-07-29 DIAGNOSIS — G4733 Obstructive sleep apnea (adult) (pediatric): Secondary | ICD-10-CM | POA: Insufficient documentation

## 2015-07-29 DIAGNOSIS — Z01818 Encounter for other preprocedural examination: Secondary | ICD-10-CM | POA: Diagnosis present

## 2015-07-29 HISTORY — DX: Other allergy status, other than to drugs and biological substances: Z91.09

## 2015-07-29 LAB — URINALYSIS, ROUTINE W REFLEX MICROSCOPIC
Bilirubin Urine: NEGATIVE
GLUCOSE, UA: NEGATIVE mg/dL
Hgb urine dipstick: NEGATIVE
KETONES UR: NEGATIVE mg/dL
LEUKOCYTES UA: NEGATIVE
NITRITE: NEGATIVE
PROTEIN: NEGATIVE mg/dL
Specific Gravity, Urine: 1.012 (ref 1.005–1.030)
pH: 6.5 (ref 5.0–8.0)

## 2015-07-29 LAB — CBC WITH DIFFERENTIAL/PLATELET
BASOS ABS: 0 10*3/uL (ref 0.0–0.1)
Basophils Relative: 1 %
EOS ABS: 0.1 10*3/uL (ref 0.0–0.7)
EOS PCT: 1 %
HCT: 45.5 % (ref 39.0–52.0)
HEMOGLOBIN: 15.2 g/dL (ref 13.0–17.0)
LYMPHS ABS: 2 10*3/uL (ref 0.7–4.0)
LYMPHS PCT: 23 %
MCH: 31.5 pg (ref 26.0–34.0)
MCHC: 33.4 g/dL (ref 30.0–36.0)
MCV: 94.4 fL (ref 78.0–100.0)
Monocytes Absolute: 0.8 10*3/uL (ref 0.1–1.0)
Monocytes Relative: 9 %
NEUTROS PCT: 66 %
Neutro Abs: 5.8 10*3/uL (ref 1.7–7.7)
PLATELETS: 215 10*3/uL (ref 150–400)
RBC: 4.82 MIL/uL (ref 4.22–5.81)
RDW: 13.2 % (ref 11.5–15.5)
WBC: 8.8 10*3/uL (ref 4.0–10.5)

## 2015-07-29 LAB — COMPREHENSIVE METABOLIC PANEL
ALK PHOS: 64 U/L (ref 38–126)
ALT: 27 U/L (ref 17–63)
AST: 25 U/L (ref 15–41)
Albumin: 3.7 g/dL (ref 3.5–5.0)
Anion gap: 10 (ref 5–15)
BUN: 19 mg/dL (ref 6–20)
CALCIUM: 9.6 mg/dL (ref 8.9–10.3)
CHLORIDE: 102 mmol/L (ref 101–111)
CO2: 29 mmol/L (ref 22–32)
CREATININE: 0.91 mg/dL (ref 0.61–1.24)
GFR calc Af Amer: >60 mL/min (ref >60)
GFR calc non Af Amer: >60 mL/min (ref >60)
GLUCOSE: 80 mg/dL (ref 65–99)
Potassium: 4.4 mmol/L (ref 3.5–5.1)
SODIUM: 141 mmol/L (ref 135–145)
Total Bilirubin: 0.6 mg/dL (ref 0.3–1.2)
Total Protein: 7.4 g/dL (ref 6.5–8.1)

## 2015-07-29 LAB — PROTIME-INR
INR: 1.03 (ref 0.00–1.49)
Prothrombin Time: 13.7 seconds (ref 11.6–15.2)

## 2015-07-29 LAB — TYPE AND SCREEN
ABO/RH(D): A POS
Antibody Screen: NEGATIVE

## 2015-07-29 LAB — SURGICAL PCR SCREEN
MRSA, PCR: NEGATIVE
STAPHYLOCOCCUS AUREUS: NEGATIVE

## 2015-07-29 LAB — APTT: APTT: 30 s (ref 24–37)

## 2015-07-29 MED ORDER — SODIUM CHLORIDE 0.9 % IV SOLN: INTRAVENOUS | Status: DC

## 2015-07-29 NOTE — Progress Notes (Signed)
[name] [name] reported that he has medical and cardiac clearance.  (Dr [name] and Dr Darcus Austin).  I called and let a message on [facility] voice message to send clearances to PAT.  (I did not find this information in Dr Harrington Challenger' notes. [name] [name] denies chest pain or shortness of breath.

## 2015-07-29 NOTE — Pre-Procedure Instructions (Signed)
    [name]     Your procedure is scheduled on Friday, February 10.  Report to [facility] Admitting at 6:30 A.M.              Your surgery or procedure is scheduled for 8:30 Am   Call this number if you have problems the morning of surgery:[phone]               For any other questions, please call [phone], Monday - Friday 8 AM - 4 PM.   Remember:  Do not eat food or drink liquids after midnight Thursday, February 9.  Take these medicines the morning of surgery with A SIP OF WATER:citalopram (CELEXA), levothyroxine (SYNTHROID, LEVOTHROID), omeprazole (PRILOSEC).               On Friday, February 3, STOP taking Aspirin, Aspirin Products, Ibuprofen( Advil).  Do not take Aleve (Naproxen), Vitamins or Herbal Medications.   Do not wear jewelry, make-up or nail polish.  Do not wear lotions, powders, or perfumes.    Men may shave face and neck.  Do not bring valuables to the hospital.  [facility] is not responsible for any belongings or valuables.  Contacts, dentures or bridgework may not be worn into surgery.  Leave your suitcase in the car.  After surgery it may be brought to your room.  For patients admitted to the hospital, discharge time will be determined by your treatment team.  Special instructions:  Review  [facility] - Preparing For Surgery.  Please read over the following fact sheets that you were given. Pain Booklet, Coughing and Deep Breathing, Blood Transfusion Information, MRSA Information and Surgical Site Infection Prevention and Incentive Spirometry.

## 2015-07-31 LAB — URINE CULTURE

## 2015-07-31 NOTE — Progress Notes (Signed)
Anesthesia Chart Review:  Pt is 70 year old male scheduled for R total knee arthroplasty on 08/08/2015 with Dr. French Ana.   PCP is Dr. Darcus Austin. Cardiologist is Dr. [name].   PMH includes:  CAD (cath 3/10: mLAD 30%, pRCA 30%), HTN, OSA (no CPAP), pulmonary nodule, GERD. Former smoker. BMI 41. S/p R knee arthroscopy 05/14/15. S/p uvulopalatopharyngoplasty 09/21/11.   Medications include: ASA, hctz, levothyroxine, losartan, prilosec, simvastatin.   Preoperative labs reviewed.    Chest x-Arish 07/29/15 reviewed. No active cardiopulmonary disease.   EKG 07/29/15: NSR. RBBB. LAFB. Unchanged when compared to EKG dated 07/08/14.   Echo 07/17/14:  - Left ventricle: The cavity size was normal. There was mild concentric hypertrophy. The estimated ejection fraction was 55%. - Atrial septum: No defect or patent foramen ovale was identified.  Cardiac cath 08/26/08:  1. Mild nonobstructive coronary artery disease. (30% mid LAD and proximal RCA) 2. Normal left ventricular function.  Pt has cardiac clearance from Dr. Harrington Challenger and medical clearance from Dr. Inda Merlin for surgery on paper chart.   If no changes, I anticipate pt can proceed with surgery as scheduled.   Willeen Cass, FNP-BC [facility]/Anesthesiology Phone: [phone] 07/31/2015 1:32 PM

## 2015-08-07 MED ORDER — CHLORHEXIDINE GLUCONATE 4 % EX LIQD: 60.0000 mL | Freq: Once | CUTANEOUS | Status: DC

## 2015-08-07 MED ORDER — SODIUM CHLORIDE 0.9 % IV SOLN
1000.0000 mg | INTRAVENOUS | Status: AC
  Administered 2015-08-08: 1000 mg via INTRAVENOUS
  Filled 2015-08-07: qty 10

## 2015-08-07 MED ORDER — ACETAMINOPHEN 500 MG PO TABS: 1000.0000 mg | ORAL_TABLET | Freq: Once | ORAL | Status: DC

## 2015-08-07 MED ORDER — VANCOMYCIN HCL 10 G IV SOLR
1500.0000 mg | INTRAVENOUS | Status: AC
  Administered 2015-08-08: 1500 mg via INTRAVENOUS
  Filled 2015-08-07 (×3): qty 1500

## 2015-08-08 ENCOUNTER — Inpatient Hospital Stay (HOSPITAL_COMMUNITY): Payer: Medicare Other | Admitting: Certified Registered"

## 2015-08-08 ENCOUNTER — Encounter (HOSPITAL_COMMUNITY)
Admission: RE | Disposition: A | Discharge status: Skilled Nursing Facility | Payer: Self-pay | Source: Ambulatory Visit | Attending: Orthopedic Surgery

## 2015-08-08 ENCOUNTER — Inpatient Hospital Stay (HOSPITAL_COMMUNITY)
Admission: RE | Admit: 2015-08-08 | Discharge: 2015-08-11 | DRG: 470 | Disposition: A | Discharge status: Skilled Nursing Facility | Payer: Medicare Other | Source: Ambulatory Visit | Attending: Orthopedic Surgery | Admitting: Orthopedic Surgery

## 2015-08-08 ENCOUNTER — Encounter (HOSPITAL_COMMUNITY): Payer: Self-pay | Admitting: Certified Registered"

## 2015-08-08 ENCOUNTER — Inpatient Hospital Stay (HOSPITAL_COMMUNITY): Payer: Medicare Other | Admitting: Emergency Medicine

## 2015-08-08 DIAGNOSIS — K219 Gastro-esophageal reflux disease without esophagitis: Secondary | ICD-10-CM | POA: Diagnosis present

## 2015-08-08 DIAGNOSIS — Z96652 Presence of left artificial knee joint: Secondary | ICD-10-CM | POA: Diagnosis present

## 2015-08-08 DIAGNOSIS — M25561 Pain in right knee: Secondary | ICD-10-CM | POA: Diagnosis present

## 2015-08-08 DIAGNOSIS — Z882 Allergy status to sulfonamides status: Secondary | ICD-10-CM | POA: Diagnosis not present

## 2015-08-08 DIAGNOSIS — E039 Hypothyroidism, unspecified: Secondary | ICD-10-CM | POA: Diagnosis present

## 2015-08-08 DIAGNOSIS — I10 Essential (primary) hypertension: Secondary | ICD-10-CM | POA: Diagnosis not present

## 2015-08-08 DIAGNOSIS — Z885 Allergy status to narcotic agent status: Secondary | ICD-10-CM

## 2015-08-08 DIAGNOSIS — E782 Mixed hyperlipidemia: Secondary | ICD-10-CM | POA: Diagnosis present

## 2015-08-08 DIAGNOSIS — F329 Major depressive disorder, single episode, unspecified: Secondary | ICD-10-CM | POA: Diagnosis present

## 2015-08-08 DIAGNOSIS — R269 Unspecified abnormalities of gait and mobility: Secondary | ICD-10-CM | POA: Diagnosis present

## 2015-08-08 DIAGNOSIS — R911 Solitary pulmonary nodule: Secondary | ICD-10-CM | POA: Diagnosis present

## 2015-08-08 DIAGNOSIS — Z880 Allergy status to penicillin: Secondary | ICD-10-CM

## 2015-08-08 DIAGNOSIS — M1711 Unilateral primary osteoarthritis, right knee: Principal | ICD-10-CM | POA: Diagnosis present

## 2015-08-08 DIAGNOSIS — G4733 Obstructive sleep apnea (adult) (pediatric): Secondary | ICD-10-CM | POA: Diagnosis present

## 2015-08-08 DIAGNOSIS — I2510 Atherosclerotic heart disease of native coronary artery without angina pectoris: Secondary | ICD-10-CM | POA: Diagnosis present

## 2015-08-08 DIAGNOSIS — Z87891 Personal history of nicotine dependence: Secondary | ICD-10-CM | POA: Diagnosis not present

## 2015-08-08 DIAGNOSIS — H9190 Unspecified hearing loss, unspecified ear: Secondary | ICD-10-CM | POA: Diagnosis present

## 2015-08-08 DIAGNOSIS — Z981 Arthrodesis status: Secondary | ICD-10-CM | POA: Diagnosis not present

## 2015-08-08 HISTORY — PX: TOTAL KNEE ARTHROPLASTY: SHX125

## 2015-08-08 LAB — CREATININE, SERUM
Creatinine, Ser: 0.91 mg/dL (ref 0.61–1.24)
GFR calc non Af Amer: >60 mL/min (ref >60)

## 2015-08-08 LAB — CBC
HEMATOCRIT: 42 % (ref 39.0–52.0)
Hemoglobin: 14 g/dL (ref 13.0–17.0)
MCH: 30.8 pg (ref 26.0–34.0)
MCHC: 33.3 g/dL (ref 30.0–36.0)
MCV: 92.3 fL (ref 78.0–100.0)
Platelets: 191 10*3/uL (ref 150–400)
RBC: 4.55 MIL/uL (ref 4.22–5.81)
RDW: 13.5 % (ref 11.5–15.5)
WBC: 12.1 10*3/uL — ABNORMAL HIGH (ref 4.0–10.5)

## 2015-08-08 SURGERY — ARTHROPLASTY, KNEE, TOTAL
Anesthesia: General | Site: Knee | Laterality: Right

## 2015-08-08 MED ORDER — HYDROMORPHONE HCL 1 MG/ML IJ SOLN: 1.0000 mg | INTRAMUSCULAR | Status: DC | PRN

## 2015-08-08 MED ORDER — OXYCODONE HCL 5 MG PO TABS
ORAL_TABLET | ORAL | Status: AC
  Filled 2015-08-08: qty 2

## 2015-08-08 MED ORDER — LIDOCAINE HCL (CARDIAC) 20 MG/ML IV SOLN
INTRAVENOUS | Status: DC | PRN
  Administered 2015-08-08: 60 mg via INTRAVENOUS

## 2015-08-08 MED ORDER — POLYETHYLENE GLYCOL 3350 17 G PO PACK
17.0000 g | PACK | Freq: Every day | ORAL | Status: DC | PRN
  Filled 2015-08-08: qty 1

## 2015-08-08 MED ORDER — PROPOFOL 10 MG/ML IV BOLUS
INTRAVENOUS | Status: AC
  Filled 2015-08-08: qty 20

## 2015-08-08 MED ORDER — PANTOPRAZOLE SODIUM 40 MG PO TBEC
80.0000 mg | DELAYED_RELEASE_TABLET | Freq: Every day | ORAL | Status: DC
  Administered 2015-08-09 – 2015-08-11 (×3): 80 mg via ORAL
  Filled 2015-08-08 (×3): qty 2

## 2015-08-08 MED ORDER — METOCLOPRAMIDE HCL 5 MG PO TABS: 5.0000 mg | ORAL_TABLET | Freq: Three times a day (TID) | ORAL | Status: DC | PRN

## 2015-08-08 MED ORDER — ONDANSETRON HCL 4 MG PO TABS: 4.0000 mg | ORAL_TABLET | Freq: Four times a day (QID) | ORAL | Status: DC | PRN

## 2015-08-08 MED ORDER — HYDROMORPHONE HCL 1 MG/ML IJ SOLN
INTRAMUSCULAR | Status: AC
  Filled 2015-08-08: qty 1

## 2015-08-08 MED ORDER — ROCURONIUM BROMIDE 100 MG/10ML IV SOLN
INTRAVENOUS | Status: DC | PRN
  Administered 2015-08-08: 50 mg via INTRAVENOUS

## 2015-08-08 MED ORDER — ROCURONIUM BROMIDE 50 MG/5ML IV SOLN
INTRAVENOUS | Status: AC
  Filled 2015-08-08: qty 1

## 2015-08-08 MED ORDER — OXYCODONE-ACETAMINOPHEN 5-325 MG PO TABS: 1.0000 | ORAL_TABLET | ORAL | Status: DC | PRN

## 2015-08-08 MED ORDER — METOCLOPRAMIDE HCL 5 MG/ML IJ SOLN: 5.0000 mg | Freq: Three times a day (TID) | INTRAMUSCULAR | Status: DC | PRN

## 2015-08-08 MED ORDER — ONDANSETRON HCL 4 MG/2ML IJ SOLN
4.0000 mg | Freq: Four times a day (QID) | INTRAMUSCULAR | Status: DC | PRN
  Administered 2015-08-09: 4 mg via INTRAVENOUS
  Filled 2015-08-08: qty 2

## 2015-08-08 MED ORDER — CITALOPRAM HYDROBROMIDE 20 MG PO TABS
20.0000 mg | ORAL_TABLET | Freq: Every day | ORAL | Status: DC
  Administered 2015-08-09 – 2015-08-11 (×3): 20 mg via ORAL
  Filled 2015-08-08 (×3): qty 1

## 2015-08-08 MED ORDER — ENOXAPARIN SODIUM 30 MG/0.3ML ~~LOC~~ SOLN
30.0000 mg | Freq: Two times a day (BID) | SUBCUTANEOUS | Status: DC
Start: 2015-08-09 — End: 2015-08-11
  Administered 2015-08-09 – 2015-08-11 (×5): 30 mg via SUBCUTANEOUS
  Filled 2015-08-08 (×5): qty 0.3

## 2015-08-08 MED ORDER — 0.9 % SODIUM CHLORIDE (POUR BTL) OPTIME
TOPICAL | Status: DC | PRN
  Administered 2015-08-08: 1000 mL

## 2015-08-08 MED ORDER — DEXAMETHASONE SODIUM PHOSPHATE 4 MG/ML IJ SOLN
INTRAMUSCULAR | Status: AC
  Filled 2015-08-08: qty 1

## 2015-08-08 MED ORDER — SUCCINYLCHOLINE CHLORIDE 20 MG/ML IJ SOLN
INTRAMUSCULAR | Status: AC
  Filled 2015-08-08: qty 1

## 2015-08-08 MED ORDER — PHENYLEPHRINE HCL 10 MG/ML IJ SOLN
INTRAMUSCULAR | Status: DC | PRN
  Administered 2015-08-08: 40 ug via INTRAVENOUS

## 2015-08-08 MED ORDER — MIDAZOLAM HCL 2 MG/2ML IJ SOLN
INTRAMUSCULAR | Status: AC
  Filled 2015-08-08: qty 2

## 2015-08-08 MED ORDER — ONDANSETRON HCL 4 MG/2ML IJ SOLN
INTRAMUSCULAR | Status: AC
  Filled 2015-08-08: qty 2

## 2015-08-08 MED ORDER — HYDROCHLOROTHIAZIDE 25 MG PO TABS
25.0000 mg | ORAL_TABLET | Freq: Every day | ORAL | Status: DC
  Administered 2015-08-08 – 2015-08-11 (×4): 25 mg via ORAL
  Filled 2015-08-08 (×4): qty 1

## 2015-08-08 MED ORDER — LOSARTAN POTASSIUM 50 MG PO TABS
50.0000 mg | ORAL_TABLET | Freq: Every day | ORAL | Status: DC
  Administered 2015-08-08 – 2015-08-11 (×4): 50 mg via ORAL
  Filled 2015-08-08 (×4): qty 1

## 2015-08-08 MED ORDER — SIMVASTATIN 20 MG PO TABS
20.0000 mg | ORAL_TABLET | Freq: Every day | ORAL | Status: DC
  Administered 2015-08-08 – 2015-08-10 (×3): 20 mg via ORAL
  Filled 2015-08-08 (×3): qty 1

## 2015-08-08 MED ORDER — ONDANSETRON HCL 4 MG/2ML IJ SOLN
INTRAMUSCULAR | Status: DC | PRN
  Administered 2015-08-08: 4 mg via INTRAVENOUS

## 2015-08-08 MED ORDER — DEXAMETHASONE SODIUM PHOSPHATE 4 MG/ML IJ SOLN
INTRAMUSCULAR | Status: DC | PRN
  Administered 2015-08-08: 4 mg via INTRAVENOUS

## 2015-08-08 MED ORDER — TROSPIUM CHLORIDE ER 60 MG PO CP24: 60.0000 mg | ORAL_CAPSULE | Freq: Every day | ORAL | Status: DC

## 2015-08-08 MED ORDER — FENTANYL CITRATE (PF) 250 MCG/5ML IJ SOLN
INTRAMUSCULAR | Status: AC
  Filled 2015-08-08: qty 5

## 2015-08-08 MED ORDER — FLEET ENEMA 7-19 GM/118ML RE ENEM
1.0000 | ENEMA | Freq: Once | RECTAL | Status: DC | PRN
  Filled 2015-08-08: qty 1

## 2015-08-08 MED ORDER — SODIUM CHLORIDE 0.9 % IV SOLN
INTRAVENOUS | Status: DC
  Administered 2015-08-09: via INTRAVENOUS

## 2015-08-08 MED ORDER — SODIUM CHLORIDE 0.9 % IR SOLN
Status: DC | PRN
  Administered 2015-08-08: 1000 mL

## 2015-08-08 MED ORDER — SUGAMMADEX SODIUM 200 MG/2ML IV SOLN
INTRAVENOUS | Status: DC | PRN
  Administered 2015-08-08: 200 mg via INTRAVENOUS

## 2015-08-08 MED ORDER — SUGAMMADEX SODIUM 200 MG/2ML IV SOLN
INTRAVENOUS | Status: AC
  Filled 2015-08-08: qty 2

## 2015-08-08 MED ORDER — DIPHENHYDRAMINE HCL 12.5 MG/5ML PO ELIX: 12.5000 mg | ORAL_SOLUTION | ORAL | Status: DC | PRN

## 2015-08-08 MED ORDER — PHENOL 1.4 % MT LIQD: 1.0000 | OROMUCOSAL | Status: DC | PRN

## 2015-08-08 MED ORDER — FENTANYL CITRATE (PF) 100 MCG/2ML IJ SOLN
INTRAMUSCULAR | Status: AC
  Filled 2015-08-08: qty 2

## 2015-08-08 MED ORDER — ROPIVACAINE HCL 5 MG/ML IJ SOLN
INTRAMUSCULAR | Status: DC | PRN
Start: 2015-08-08 — End: 2015-08-08
  Administered 2015-08-08: 30 mL via PERINEURAL

## 2015-08-08 MED ORDER — DOCUSATE SODIUM 100 MG PO CAPS
100.0000 mg | ORAL_CAPSULE | Freq: Two times a day (BID) | ORAL | Status: DC
  Administered 2015-08-08 – 2015-08-11 (×7): 100 mg via ORAL
  Filled 2015-08-08 (×7): qty 1

## 2015-08-08 MED ORDER — OXYCODONE HCL 5 MG PO TABS
5.0000 mg | ORAL_TABLET | ORAL | Status: DC | PRN
  Administered 2015-08-08 – 2015-08-11 (×12): 10 mg via ORAL
  Filled 2015-08-08 (×11): qty 2

## 2015-08-08 MED ORDER — ENOXAPARIN SODIUM 30 MG/0.3ML ~~LOC~~ SOLN: 30.0000 mg | Freq: Two times a day (BID) | SUBCUTANEOUS | Status: DC

## 2015-08-08 MED ORDER — HYDROMORPHONE HCL 1 MG/ML IJ SOLN
0.2500 mg | INTRAMUSCULAR | Status: DC | PRN
  Administered 2015-08-08 (×4): 0.5 mg via INTRAVENOUS

## 2015-08-08 MED ORDER — MIDAZOLAM HCL 2 MG/2ML IJ SOLN
1.0000 mg | Freq: Once | INTRAMUSCULAR | Status: AC
  Administered 2015-08-08: 1 mg via INTRAVENOUS

## 2015-08-08 MED ORDER — ACETAMINOPHEN 325 MG PO TABS
650.0000 mg | ORAL_TABLET | Freq: Four times a day (QID) | ORAL | Status: DC | PRN
  Administered 2015-08-09 – 2015-08-10 (×4): 650 mg via ORAL
  Filled 2015-08-08 (×3): qty 2

## 2015-08-08 MED ORDER — MENTHOL 3 MG MT LOZG: 1.0000 | LOZENGE | OROMUCOSAL | Status: DC | PRN

## 2015-08-08 MED ORDER — LACTATED RINGERS IV SOLN
INTRAVENOUS | Status: DC
  Administered 2015-08-08: 08:00:00 via INTRAVENOUS

## 2015-08-08 MED ORDER — FENTANYL CITRATE (PF) 100 MCG/2ML IJ SOLN
INTRAMUSCULAR | Status: DC | PRN
  Administered 2015-08-08 (×2): 50 ug via INTRAVENOUS
  Administered 2015-08-08: 100 ug via INTRAVENOUS
  Administered 2015-08-08: 50 ug via INTRAVENOUS

## 2015-08-08 MED ORDER — LEVOTHYROXINE SODIUM 75 MCG PO TABS
150.0000 ug | ORAL_TABLET | Freq: Every day | ORAL | Status: DC
  Administered 2015-08-09 – 2015-08-11 (×3): 150 ug via ORAL
  Filled 2015-08-08 (×3): qty 2

## 2015-08-08 MED ORDER — VANCOMYCIN HCL IN DEXTROSE 1-5 GM/200ML-% IV SOLN
1000.0000 mg | Freq: Two times a day (BID) | INTRAVENOUS | Status: AC
  Administered 2015-08-08: 1000 mg via INTRAVENOUS
  Filled 2015-08-08: qty 200

## 2015-08-08 MED ORDER — PROPOFOL 10 MG/ML IV BOLUS
INTRAVENOUS | Status: DC | PRN
  Administered 2015-08-08: 10 mg via INTRAVENOUS
  Administered 2015-08-08: 190 mg via INTRAVENOUS

## 2015-08-08 MED ORDER — PROMETHAZINE HCL 25 MG/ML IJ SOLN: 6.2500 mg | INTRAMUSCULAR | Status: DC | PRN

## 2015-08-08 MED ORDER — LIDOCAINE HCL (CARDIAC) 20 MG/ML IV SOLN
INTRAVENOUS | Status: AC
  Filled 2015-08-08: qty 5

## 2015-08-08 MED ORDER — ACETAMINOPHEN 650 MG RE SUPP: 650.0000 mg | Freq: Four times a day (QID) | RECTAL | Status: DC | PRN

## 2015-08-08 MED ORDER — SORBITOL 70 % SOLN: 30.0000 mL | Freq: Every day | Status: DC | PRN

## 2015-08-08 MED ORDER — FENTANYL CITRATE (PF) 100 MCG/2ML IJ SOLN
50.0000 ug | Freq: Once | INTRAMUSCULAR | Status: AC
  Administered 2015-08-08: 50 ug via INTRAVENOUS

## 2015-08-08 SURGICAL SUPPLY — 69 items
BANDAGE ELASTIC 4 VELCRO ST LF (GAUZE/BANDAGES/DRESSINGS) ×3 IMPLANT
BANDAGE ELASTIC 6 VELCRO ST LF (GAUZE/BANDAGES/DRESSINGS) ×3 IMPLANT
BANDAGE ESMARK 6X9 LF (GAUZE/BANDAGES/DRESSINGS) ×1 IMPLANT
BLADE SAGITTAL 25.0X1.19X90 (BLADE) ×2 IMPLANT
BLADE SAGITTAL 25.0X1.19X90MM (BLADE) ×1
BLADE SAW SAG 90X13X1.27 (BLADE) ×3 IMPLANT
BLADE SURG 10 STRL SS (BLADE) ×2 IMPLANT
BNDG CMPR 9X6 STRL LF SNTH (GAUZE/BANDAGES/DRESSINGS) ×1
BNDG ESMARK 6X9 LF (GAUZE/BANDAGES/DRESSINGS) ×3
BOWL SMART MIX CTS (DISPOSABLE) ×3 IMPLANT
CAP KNEE TOTAL 3 SIGMA ×2 IMPLANT
CEMENT HV SMART SET (Cement) ×4 IMPLANT
COVER SURGICAL LIGHT HANDLE (MISCELLANEOUS) ×3 IMPLANT
CUFF TOURNIQUET SINGLE 34IN LL (TOURNIQUET CUFF) ×3 IMPLANT
CUFF TOURNIQUET SINGLE 44IN (TOURNIQUET CUFF) IMPLANT
DRAPE INCISE IOBAN 66X45 STRL (DRAPES) IMPLANT
DRAPE ORTHO SPLIT 77X108 STRL (DRAPES) ×6
DRAPE SURG ORHT 6 SPLT 77X108 (DRAPES) ×2 IMPLANT
DRAPE U-SHAPE 47X51 STRL (DRAPES) ×3 IMPLANT
DRSG ADAPTIC 3X8 NADH LF (GAUZE/BANDAGES/DRESSINGS) ×3 IMPLANT
DRSG PAD ABDOMINAL 8X10 ST (GAUZE/BANDAGES/DRESSINGS) ×4 IMPLANT
DURAPREP 26ML APPLICATOR (WOUND CARE) ×5 IMPLANT
ELECT REM PT RETURN 9FT ADLT (ELECTROSURGICAL) ×3
ELECTRODE REM PT RTRN 9FT ADLT (ELECTROSURGICAL) ×1 IMPLANT
EVACUATOR 1/8 PVC DRAIN (DRAIN) IMPLANT
FACESHIELD WRAPAROUND (MASK) ×6 IMPLANT
FACESHIELD WRAPAROUND OR TEAM (MASK) ×2 IMPLANT
FLOSEAL 10ML (HEMOSTASIS) IMPLANT
GAUZE SPONGE 4X4 12PLY STRL (GAUZE/BANDAGES/DRESSINGS) ×3 IMPLANT
GLOVE BIOGEL PI IND STRL 8 (GLOVE) ×4 IMPLANT
GLOVE BIOGEL PI INDICATOR 8 (GLOVE) ×8
GLOVE ORTHO TXT STRL SZ7.5 (GLOVE) ×3 IMPLANT
GLOVE SURG ORTHO 8.0 STRL STRW (GLOVE) ×3 IMPLANT
GOWN STRL REUS W/ TWL LRG LVL3 (GOWN DISPOSABLE) ×2 IMPLANT
GOWN STRL REUS W/ TWL XL LVL3 (GOWN DISPOSABLE) ×1 IMPLANT
GOWN STRL REUS W/TWL 2XL LVL3 (GOWN DISPOSABLE) ×3 IMPLANT
GOWN STRL REUS W/TWL LRG LVL3 (GOWN DISPOSABLE) ×6
GOWN STRL REUS W/TWL XL LVL3 (GOWN DISPOSABLE) ×3
HANDPIECE INTERPULSE COAX TIP (DISPOSABLE) ×3
HOOD PEEL AWAY FACE SHEILD DIS (HOOD) ×5 IMPLANT
IMMOBILIZER KNEE 22 (SOFTGOODS) ×2 IMPLANT
IMMOBILIZER KNEE 22 UNIV (SOFTGOODS) IMPLANT
KIT BASIN OR (CUSTOM PROCEDURE TRAY) ×3 IMPLANT
KIT ROOM TURNOVER OR (KITS) ×3 IMPLANT
MANIFOLD NEPTUNE II (INSTRUMENTS) ×3 IMPLANT
NEEDLE 22X1 1/2 (OR ONLY) (NEEDLE) IMPLANT
NS IRRIG 1000ML POUR BTL (IV SOLUTION) ×3 IMPLANT
PACK TOTAL JOINT (CUSTOM PROCEDURE TRAY) ×3 IMPLANT
PACK UNIVERSAL I (CUSTOM PROCEDURE TRAY) ×1 IMPLANT
PAD ARMBOARD 7.5X6 YLW CONV (MISCELLANEOUS) ×6 IMPLANT
PAD CAST 4YDX4 CTTN HI CHSV (CAST SUPPLIES) ×1 IMPLANT
PADDING CAST COTTON 4X4 STRL (CAST SUPPLIES) ×3
PADDING CAST COTTON 6X4 STRL (CAST SUPPLIES) ×3 IMPLANT
SET HNDPC FAN SPRY TIP SCT (DISPOSABLE) ×1 IMPLANT
STAPLER VISISTAT 35W (STAPLE) ×3 IMPLANT
SUCTION FRAZIER HANDLE 10FR (MISCELLANEOUS) ×2
SUCTION TUBE FRAZIER 10FR DISP (MISCELLANEOUS) ×1 IMPLANT
SUT ETHIBOND NAB CT1 #1 30IN (SUTURE) ×9 IMPLANT
SUT VIC AB 0 CT1 27 (SUTURE) ×3
SUT VIC AB 0 CT1 27XBRD ANBCTR (SUTURE) ×1 IMPLANT
SUT VIC AB 2-0 CT1 27 (SUTURE) ×6
SUT VIC AB 2-0 CT1 TAPERPNT 27 (SUTURE) ×2 IMPLANT
SYR CONTROL 10ML LL (SYRINGE) IMPLANT
TOWEL OR 17X24 6PK STRL BLUE (TOWEL DISPOSABLE) ×3 IMPLANT
TOWEL OR 17X26 10 PK STRL BLUE (TOWEL DISPOSABLE) ×3 IMPLANT
TRAY FOLEY CATH 16FRSI W/METER (SET/KITS/TRAYS/PACK) IMPLANT
TUBE CONNECTING 12'X1/4 (SUCTIONS) ×1
TUBE CONNECTING 12X1/4 (SUCTIONS) ×1 IMPLANT
WATER STERILE IRR 1000ML POUR (IV SOLUTION) ×1 IMPLANT

## 2015-08-08 NOTE — Progress Notes (Signed)
Pt arrived to the unit at 1235 from PACU via bed. Pt oriented to the unit and room; family at bedside; VSS; IV intact and transfusing. Pt due to void per report; right knee incision compression dsg clean, dry and intact with no stain or active bleeding noted. Pt arrived to the unit with CPM ON. Pt in bed comfortably with call light within reach and family at bedside. Will closely monitor. Delia Heady RN

## 2015-08-08 NOTE — Brief Op Note (Signed)
08/08/2015  10:54 AM  PATIENT:  [name] [name]  70 y.o. male  PRE-OPERATIVE DIAGNOSIS:  PRIMARY LOCALIZED OSTEOARTHRITIS RIGHT KNEE  POST-OPERATIVE DIAGNOSIS:  PRIMARY LOCALIZED OSTEOARTHRITIS RIGHT KNEE  PROCEDURE:  Procedure(s): RIGHT TOTAL KNEE ARTHROPLASTY (Right)  SURGEON:  Surgeon(s) and Role:    * [name], MD - Primary  PHYSICIAN ASSISTANT: Chriss Czar, PA-C  ASSISTANTS: OR staff x1  ANESTHESIA:   regional and general  EBL:     BLOOD ADMINISTERED:none  DRAINS: none   LOCAL MEDICATIONS USED:  NONE  SPECIMEN:  No Specimen  DISPOSITION OF SPECIMEN:  N/A  COUNTS:  YES  TOURNIQUET:   Total Tourniquet Time Documented: Thigh (Right) - 64 minutes Total: Thigh (Right) - 64 minutes   DICTATION: .Other Dictation: Dictation Number unknown  PLAN OF CARE: Admit to inpatient   PATIENT DISPOSITION:  PACU - hemodynamically stable.   Delay start of Pharmacological VTE agent (>24hrs) due to surgical blood loss or risk of bleeding: yes

## 2015-08-08 NOTE — Progress Notes (Signed)
Orthopedic Tech Progress Note Patient Details:  [name] [name] 05-07-1946 [id number] On cpm at [place] Patient ID: [name] [name], male   DOB: [birth year], 70 y.o.   MRN: [id number]   [name] [name] 08/08/2015, 7:30 PM

## 2015-08-08 NOTE — Anesthesia Procedure Notes (Addendum)
Anesthesia Regional Block:  Femoral nerve block  Pre-Anesthetic Checklist: ,, timeout performed, Correct Patient, Correct Site, Correct Laterality, Correct Procedure, Correct Position, site marked, Risks and benefits discussed,  Surgical consent,  Pre-op evaluation,  At surgeon's request and post-op pain management  Laterality: Right  Prep: chloraprep       Needles:  Injection technique: Single-shot  Needle Type: Echogenic Needle     Needle Length: 9cm 9 cm Needle Gauge: 21 and 21 G    Additional Needles:  Procedures: ultrasound guided (picture in chart) Femoral nerve block Narrative:  Injection made incrementally with aspirations every 5 mL.  Performed by: Personally   Additional Notes: Patient tolerated the procedure well without complications   Procedure Name: Intubation Date/Time: 08/08/2015 8:48 AM Performed by: Melina Copa, Ceriah Kohler R Pre-anesthesia Checklist: Patient identified, Emergency Drugs available, Suction available, Patient being monitored and Timeout performed Patient Re-evaluated:Patient Re-evaluated prior to inductionOxygen Delivery Method: Circle system utilized Preoxygenation: Pre-oxygenation with 100% oxygen Intubation Type: IV induction Laryngoscope Size: Mac and 4 Grade View: Grade II Tube type: Oral Tube size: 7.5 mm Number of attempts: 1 Airway Equipment and Method: Stylet Placement Confirmation: ETT inserted through vocal cords under direct vision,  positive ETCO2 and breath sounds checked- equal and bilateral Secured at: 22 cm Tube secured with: Tape Dental Injury: Teeth and Oropharynx as per pre-operative assessment

## 2015-08-08 NOTE — Clinical Social Work Placement (Addendum)
   CLINICAL SOCIAL WORK PLACEMENT  NOTE  Date:  08/08/2015  Patient Details  Name: [name] [name] MRN: [id number] Date of Birth: 05/28/1946  Clinical Social Work is seeking post-discharge placement for this patient at the [place] level of care (*CSW will initial, date and re-position this form in  chart as items are completed):  Yes   Patient/family provided with [facility] Work Department's list of facilities offering this level of care within the geographic area requested by the patient (or if unable, by the patient's family).  Yes   Patient/family informed of their freedom to choose among providers that offer the needed level of care, that participate in Medicare, Medicaid or managed care program needed by the patient, have an available bed and are willing to accept the patient.  Yes   Patient/family informed of [facility]'s ownership interest in [facility] and [facility], as well as of the fact that they are under no obligation to receive care at these facilities.  PASRR submitted to EDS on 08/08/15     PASRR number received on       Existing PASRR number confirmed on 08/08/15     FL2 transmitted to all facilities in geographic area requested by pt/family on 08/08/15     FL2 transmitted to all facilities within larger geographic area on 08/08/15     Patient informed that his/her managed care company has contracts with or will negotiate with certain facilities, including the following:         08-08-15   Patient/family informed of bed offers received. Evette Cristal, MSW, [place], Updated 08/11/15)  Patient chooses bed at  [facility] (Evette Cristal, MSW, [place], Updated 08/11/15)     Physician recommends and patient chooses bed at      Patient to be transferred to  [facility] on   08-11-15.  Patient to be transferred to facility by  [facility]  Evette Cristal, MSW, [place], Updated 08/11/15)   Patient family notified on  08/11/15 of  transfer. Evette Cristal, MSW, [place], Updated 08/11/15)  Name of family member notified:   [name] [name] patient's wife Evette Cristal, MSW, [place], Updated 08/11/15)     PHYSICIAN Please sign FL2     Additional Comment:    _______________________________________________ Ross Ludwig, LCSWA 08/08/2015, 7:22 PM

## 2015-08-08 NOTE — Evaluation (Signed)
Physical Therapy Evaluation Patient Details Name: [name] [name] MRN: [id number] DOB: [birth year] Today's Date: 08/08/2015   History of Present Illness  70 y.o. male now s/p Rt TKA. PMH: CAD, depression, PLIF, Lt rotator cuff repair, Lt TKA.   Clinical Impression  Pt is s/p TKA resulting in the deficits listed below (see PT Problem List).  Pt will benefit from skilled PT to increase their independence and safety with mobility to allow discharge to the venue listed below. Based upon the patients initial mobilization, recommending SNF at D/C for more rehabilitation before ultimately returning home.      Follow Up Recommendations SNF;Supervision for mobility/OOB    Equipment Recommendations  None recommended by PT    Recommendations for Other Services       Precautions / Restrictions Precautions Precautions: Knee;Fall Precaution Booklet Issued: Yes (comment) Precaution Comments: HEP provided, reviewed no pillows under knee Required Braces or Orthoses: Knee Immobilizer - Right Knee Immobilizer - Right: On when out of bed or walking Restrictions Weight Bearing Restrictions: Yes RLE Weight Bearing: Weight bearing as tolerated      Mobility  Bed Mobility Overal bed mobility: Needs Assistance Bed Mobility: Supine to Sit;Sit to Supine     Supine to sit: Min assist;HOB elevated (Rt LE, using rail) Sit to supine: Mod assist (bilateral LEs)   General bed mobility comments: supervision scooting laterally in bed.   Transfers Overall transfer level: Needs assistance Equipment used: Rolling walker (2 wheeled) Transfers: Sit to/from Stand Sit to Stand: Max assist;From elevated surface         General transfer comment: Patient unable to fully stand, assisted back onto bed. Patient reports just not having the Lt LE or UE strength to fully stand.   Ambulation/Gait             General Gait Details: unable to safety attempt.   Stairs            Wheelchair Mobility     Modified Rankin (Stroke Patients Only)       Balance Overall balance assessment: Needs assistance Sitting-balance support: No upper extremity supported Sitting balance-Leahy Scale: Good     Standing balance support: Bilateral upper extremity supported Standing balance-Leahy Scale: Poor Standing balance comment: requiring UE support and manual assist.                              Pertinent Vitals/Pain Pain Assessment: Faces Faces Pain Scale: Hurts little more Pain Location: rt knee, back Pain Descriptors / Indicators: Sore;Aching Pain Intervention(s): Monitored during session;Limited activity within patient's tolerance    Home Living Family/patient expects to be discharged to:: Skilled nursing facility Living Arrangements: Spouse/significant other   Type of Home: House Home Access: Stairs to enter Entrance Stairs-Rails: Chemical engineer of Steps: 10 Home Layout: Two level Home Equipment: Environmental consultant - 2 wheels;Walker - 4 wheels;Walker - standard;Cane - quad      Prior Function Level of Independence: Independent         Comments: Admits to poor stability PTA, frequently running into objects and general instability. States that he did not use any device.      Hand Dominance        Extremity/Trunk Assessment               Lower Extremity Assessment: Generalized weakness;RLE deficits/detail RLE Deficits / Details: assist needed with SLR       Communication   Communication: No difficulties  Cognition  Arousal/Alertness: Awake/alert Behavior During Therapy: WFL for tasks assessed/performed Overall Cognitive Status: Within Functional Limits for tasks assessed                      General Comments      Exercises        Assessment/Plan    PT Assessment Patient needs continued PT services  PT Diagnosis Difficulty walking;Acute pain   PT Problem List Decreased strength;Decreased range of motion;Decreased activity  tolerance;Decreased balance;Decreased mobility;Pain  PT Treatment Interventions DME instruction;Gait training;Stair training;Therapeutic activities;Functional mobility training;Therapeutic exercise;Balance training;Patient/family education   PT Goals (Current goals can be found in the Care Plan section) Acute Rehab PT Goals Patient Stated Goal: go to rehab before going home PT Goal Formulation: With patient Time For Goal Achievement: 08/22/15 Potential to Achieve Goals: Good    Frequency 7X/week   Barriers to discharge Inaccessible home environment      Co-evaluation               End of Session Equipment Utilized During Treatment: Right knee immobilizer;Gait belt Activity Tolerance: Patient tolerated treatment well Patient left: in bed;with call bell/phone within reach;with family/visitor present;with SCD's reapplied;with bed alarm set Nurse Communication: Mobility status;Precautions;Weight bearing status         Time: [id number] PT Time Calculation (min) (ACUTE ONLY): 43 min   Charges:   PT Evaluation $PT Eval Moderate Complexity: 1 Procedure PT Treatments $Therapeutic Activity: 23-37 mins   PT G Codes:        Cassell Clement, PT, CSCS Pager [phone] Office [phone]  08/08/2015, 3:06 PM

## 2015-08-08 NOTE — Care Management Note (Signed)
Case Management Note  Patient Details  Name: [name] [name] MRN: [id number] Date of Birth: 1945-12-05  Subjective/Objective:           S/p right total knee arthroplasty         Action/Plan: PT recommending SNF. Referral made to CSW. Will continue to follow for d/c needs.   Expected Discharge Date:                  Expected Discharge Plan:  Skilled Nursing Facility  In-House Referral:  Clinical Social Work  Discharge planning Services  CM Consult  Post Acute Care Choice:  NA Choice offered to:     DME Arranged:  N/A DME Agency:  NA  HH Arranged:  NA HH Agency:  NA  Status of Service:  In process, will continue to follow  Medicare Important Message Given:    Date Medicare IM Given:    Medicare IM give by:    Date Additional Medicare IM Given:    Additional Medicare Important Message give by:     If discussed at [place] of Stay Meetings, dates discussed:    Additional Comments:  Nila Nephew, RN 08/08/2015, 3:13 PM

## 2015-08-08 NOTE — Anesthesia Postprocedure Evaluation (Signed)
Anesthesia Post Note  Patient: [name] [name]  Procedure(s) Performed: Procedure(s) (LRB): RIGHT TOTAL KNEE ARTHROPLASTY (Right)  Patient location during evaluation: PACU Anesthesia Type: General and Regional Level of consciousness: awake and alert Pain management: pain level controlled Vital Signs Assessment: post-procedure vital signs reviewed and stable Respiratory status: spontaneous breathing, nonlabored ventilation, respiratory function stable and patient connected to nasal cannula oxygen Cardiovascular status: blood pressure returned to baseline and stable Postop Assessment: no signs of nausea or vomiting Anesthetic complications: no    Last Vitals:  Filed Vitals:   08/08/15 1115 08/08/15 1130  BP: 156/77 143/75  Pulse: 87 89  Temp:    Resp: 13 17    Last Pain:  Filed Vitals:   08/08/15 1136  PainSc: 6         RLE Motor Response: Purposeful movement (wiggles toes) (08/08/15 1130) RLE Sensation: Decreased;Tingling (08/08/15 1130)      Unnamed Hino S

## 2015-08-08 NOTE — Progress Notes (Signed)
Utilization review completed.  

## 2015-08-08 NOTE — Anesthesia Preprocedure Evaluation (Signed)
Anesthesia Evaluation  Patient identified by MRN, date of birth, ID band Patient awake    Reviewed: Allergy & Precautions, NPO status , Patient's Chart, lab work & pertinent test results  Airway Mallampati: II  TM Distance: >3 FB Neck ROM: Full    Dental no notable dental hx.    Pulmonary sleep apnea , former smoker,    Pulmonary exam normal breath sounds clear to auscultation       Cardiovascular hypertension, Pt. on medications Normal cardiovascular exam Rhythm:Regular Rate:Normal     Neuro/Psych negative neurological ROS  negative psych ROS   GI/Hepatic negative GI ROS, Neg liver ROS,   Endo/Other  Hypothyroidism   Renal/GU negative Renal ROS  negative genitourinary   Musculoskeletal negative musculoskeletal ROS (+)   Abdominal   Peds negative pediatric ROS (+)  Hematology negative hematology ROS (+)   Anesthesia Other Findings   Reproductive/Obstetrics negative OB ROS                             Anesthesia Physical Anesthesia Plan  ASA: III  Anesthesia Plan: General   Post-op Pain Management: GA combined w/ Regional for post-op pain   Induction: Intravenous  Airway Management Planned: Oral ETT  Additional Equipment:   Intra-op Plan:   Post-operative Plan: Extubation in OR  Informed Consent: I have reviewed the patients History and Physical, chart, labs and discussed the procedure including the risks, benefits and alternatives for the proposed anesthesia with the patient or authorized representative who has indicated his/her understanding and acceptance.   Dental advisory given  Plan Discussed with: CRNA and Surgeon  Anesthesia Plan Comments:         Anesthesia Quick Evaluation

## 2015-08-08 NOTE — H&P (View-Only) (Signed)
TOTAL KNEE ADMISSION H&P  Patient is being admitted for right total knee arthroplasty.  Subjective:  Chief Complaint:right knee pain.  HPI: [name] [name], 70 y.o. male, has a history of pain and functional disability in the right knee due to arthritis and has failed non-surgical conservative treatments for greater than 12 weeks to includeNSAID's and/or analgesics, corticosteriod injections, viscosupplementation injections, flexibility and strengthening excercises, use of assistive devices and activity modification.  Onset of symptoms was gradual, starting 6 years ago with gradually worsening course since that time. The patient noted prior procedures on the knee to include  arthroscopy and menisectomy on the right knee(s).  Patient currently rates pain in the right knee(s) at 10 out of 10 with activity. Patient has night pain, worsening of pain with activity and weight bearing, pain that interferes with activities of daily living, pain with passive range of motion, crepitus and joint swelling.  Patient has evidence of periarticular osteophytes and joint space narrowing by imaging studies. There is no active infection.  Patient Active Problem List   Diagnosis Date Noted  . Cough 12/10/2013  . Abnormality of gait 03/14/2013  . Pulmonary nodule 06/13/2012  . Pre-operative cardiovascular examination 01/27/2011  . HYPERLIPIDEMIA-MIXED 06/18/2009  . CAD, NATIVE VESSEL 06/18/2009  . OBSTRUCTIVE SLEEP APNEA 03/20/2008  . WEIGHT GAIN, ABNORMAL 03/20/2008  . DYSPNEA 03/20/2008  . HYPERTENSION 07/14/2007  . G E R D 07/14/2007   Past Medical History  Diagnosis Date  . Observed sleep apnea     can't wear cpap  . HTN (hypertension)   . GERD (gastroesophageal reflux disease)   . Dyspnea     -PFTs compeltely normal 03/20/08 including DLc0  . Sleep apnea     Most recent sleep study 2010; records at [facility] office  . CAD (coronary artery disease)     cath 3/10: mLAD 30%, pRCA 30%, EF 55-60% Dr.  Harrington Challenger cardiologist  most recent  stress stress done ~ 2 years ago with Dr. Harrington Challenger  . Hypothyroidism   . Low testosterone   . Arthritis   . Depression   . Polycythemia   . PONV (postoperative nausea and vomiting)   . Complication of anesthesia     "he usually gets an ileus after back OR"  . Pulmonary nodule 06/13/2012  . Rosacea     Past Surgical History  Procedure Laterality Date  . Cataract extraction, bilateral  2010  . Meniscus repair  08/2008    left  . Tonsillectomy  09/21/11  . Uvulopalatopharyngoplasty, tonsillectomy and septoplasty  09/21/11  . Posterior fusion lumbar spine  2005; 01/2007; 04/2009    L5; L3-4; L2-3  . Lumbar disc surgery  06/2006    L3  . Back surgery  2005; 06/2006; 01/2007; 04/2009  . Prostate surgery  2000  . Transurethral resection of prostate  2006    followed by "surgery to get rid of clots"  . Shoulder open rotator cuff repair      left  . Cervical fusion  04/2002    ?C3-4  . Total knee arthroplasty  01/2011    left  . Cardiac catheterization  08/2008  . Esophagogastroduodenoscopy  multiple    last 07/2012 GERD esophagitis, 48 Fr dilation  . Colonoscopy    . Knee arthroscopy with medial menisectomy Right 05/14/2015    Procedure: RIGHT KNEE ARTHROSCOPY CHONDROPLASTY, PARTIAL  MEDIAL MENISECTOMY;  Surgeon: [name], MD;  Location: [facility];  Service: Orthopedics;  Laterality: Right;     (Not  in a hospital admission) Allergies  Allergen Reactions  . Penicillins Hives and Other (See Comments)    "I break out in welts"  . Codeine Nausea And Vomiting  . Sulfonamide Derivatives Nausea And Vomiting    Social History  Substance Use Topics  . Smoking status: Former Smoker -- 1.50 packs/day for 18 years    Types: Cigarettes    Quit date: 06/28/1974  . Smokeless tobacco: Never Used  . Alcohol Use: No    Family History  Problem Relation Age of Onset  . Anesthesia problems Neg Hx   . Breast cancer Mother   . Colon polyps Mother    . Diabetes Mother   . Breast cancer Sister   . Diabetes Sister     borderline  . Heart Problems Sister   . Heart attack Father   . Alzheimer's disease Father   . Lung cancer Paternal Uncle     x4  . Throat cancer Maternal Uncle   . Liver cancer Maternal Uncle      Review of Systems  HENT: Positive for hearing loss.   Gastrointestinal: Positive for constipation.  Musculoskeletal: Positive for myalgias, back pain and joint pain.  Neurological: Positive for focal weakness.  All other systems reviewed and are negative.   Objective:  Physical Exam  Constitutional: He is oriented to person, place, and time. He appears well-developed and well-nourished. No distress.  HENT:  Head: Normocephalic and atraumatic.  Nose: Nose normal.  Eyes: Conjunctivae and EOM are normal. Pupils are equal, round, and reactive to light.  Neck: Normal range of motion. Neck supple.  Cardiovascular: Normal rate, regular rhythm, normal heart sounds and intact distal pulses.   Respiratory: Effort normal and breath sounds normal. No respiratory distress. He has no wheezes.  GI: Soft. Bowel sounds are normal. He exhibits no distension. There is no tenderness.  Musculoskeletal:       Right knee: He exhibits decreased range of motion and swelling. He exhibits no LCL laxity and no MCL laxity. Tenderness found. Medial joint line tenderness noted.  Lymphadenopathy:    He has no cervical adenopathy.  Neurological: He is alert and oriented to person, place, and time. No cranial nerve deficit.  Skin: Skin is warm and dry. No rash noted. No erythema.  Psychiatric: He has a normal mood and affect. His behavior is normal.    Vital signs in last 24 hours: @VSRANGES @  Labs:   Estimated body mass index is 40.66 kg/(m^2) as calculated from the following:   Height as of 05/14/15: 5\' 10"  (1.778 m).   Weight as of 05/12/15: 128.549 kg (283 lb 6.4 oz).   Imaging Review Plain radiographs demonstrate moderate  degenerative joint disease of the right knee(s). The overall alignment ismild varus. The bone quality appears to be good for age and reported activity level.  Assessment/Plan:  End stage arthritis, right knee   The patient history, physical examination, clinical judgment of the provider and imaging studies are consistent with end stage degenerative joint disease of the right knee(s) and total knee arthroplasty is deemed medically necessary. The treatment options including medical management, injection therapy arthroscopy and arthroplasty were discussed at length. The risks and benefits of total knee arthroplasty were presented and reviewed. The risks due to aseptic loosening, infection, stiffness, patella tracking problems, thromboembolic complications and other imponderables were discussed. The patient acknowledged the explanation, agreed to proceed with the plan and consent was signed. Patient is being admitted for inpatient treatment for surgery, pain control, PT,  OT, prophylactic antibiotics, VTE prophylaxis, progressive ambulation and ADL's and discharge planning. The patient is planning to be discharged to skilled nursing facility prefers [facility].

## 2015-08-08 NOTE — Op Note (Signed)
NAMEFAARIS, COLLETT NO.:  [id number]  MEDICAL RECORD NO.:  [id number]  LOCATION:  5N25C                        FACILITY:  [facility]  PHYSICIAN:  Lockie Pares, M.D.    DATE OF BIRTH:  06/30/1945  DATE OF PROCEDURE:  08/08/2015 DATE OF DISCHARGE:                              OPERATIVE REPORT   PREOPERATIVE DIAGNOSIS:  Severe osteoarthritis, right knee.  POSTOPERATIVE DIAGNOSIS:  Severe osteoarthritis, right knee.  OPERATION:  Right total knee replacement (Sigma size 4 femur, 2 tibia, 10 mm bearing with 41 mm all poly patella.  SURGEON:  Lockie Pares, M.D.  ASSISTANT:  Chriss Czar, PA-C.  TOURNIQUET TIME:  60 minutes.  ANESTHESIA:  General with a femoral nerve block.  DESCRIPTION OF PROCEDURE:  Exsanguination of the leg, inflation of the tourniquet to 350, straight skin incision made with a medial parapatellar approach to the knee made we cut 11 mm 5-degree valgus cut off the distal femur, followed by cutting at the external guide about 4 mm below the most diseased medial compartment with a provisional secondary cut of an additional 2 mm to obtain full extension of the knee, which had a moderate varus deformity with flexion contracture. Extension gap measured at 10 mm, sized to be a size 4 femur, followed by placement of 1 cutting block with the appropriate degree of external rotation followed by the anterior, posterior, and chamfer cuts.  Attention next directed at the tibia.  Keel hole was cut for the tibia with a trial 5 size 4 base plate placed.  We then cut the box for the size 4 femur, followed by placement of the femoral trial.  Patella was cut leaving about 16 mm of native patella and a 41 mm all poly trial placed with the tibial and femoral trials.  This corrected the flexion contracture with full extension.  Good stability to varus and valgus, balancing of the ligament.  No tendency to bearing spin out and the patella tracked normally.   Trials removed.  Bony surfaces were irrigated with pulse lavage.  We inserted the final components, tibia followed by femur patella, elected to use a trial bearing with the cement hardened. Once the cement was hard, removed the trial bearing checked at posterior aspect of the knee for any excessive cement that was noted.  We then released the tourniquet.  No excessive bleeding was noted at the posterior aspect of the knee.  Small bleeders were coagulated.  Closure was affected with #1 Ethibond 0-0 Vicryl skin clips lightly compressive sterile dressing and a knee immobilizer applied taken recovery stable condition.     Lockie Pares, M.D.     WDC/MEDQ  D:  08/08/2015  T:  08/08/2015  Job:  [id number]

## 2015-08-08 NOTE — Clinical Social Work Note (Signed)
Clinical Social Work Assessment  Patient Details  Name: [name] [name] MRN: [id number] Date of Birth: [birth year]  Date of referral:  08/08/15               Reason for consult:  Facility Placement                Permission sought to share information with:  Facility Sport and exercise psychologist, Family Supports Permission granted to share information::  Yes, Verbal Permission Granted  Name::     Nikki, Jenson [id number] or [phone] or Tucker,Kim Daughter [phone] or [phone]  Agency::  SNF admissions  Relationship::     Contact Information:     Housing/Transportation Living arrangements for the past 2 months:  Single Family Home Source of Information:  Patient, Spouse Patient Interpreter Needed:  None Criminal Activity/Legal Involvement Pertinent to Current Situation/Hospitalization:  No - Comment as needed Significant Relationships:  Adult Children, Spouse Lives with:  Spouse Do you feel safe going back to the place where you live?  No (Patient feels he needs some short term rehab before he can return back home.) Need for family participation in patient care:  No (Coment)  Care giving concerns:  Patient feel he needs short term rehab before he can return back home.   Social Worker assessment / plan:  Patient is a 70 year old male who is married and lives with his wife.  Patient is alert and oriented x4 and able to make decisions on his own.  Patient expressed that he has been to rehab before, and is aware of what to expect.  CSW explained process and role of CSW to patient in order to plan for discharge to SNF.  Patient is pre registered at [facility], which is close to where he lives.  Patient's wife was at bedside, patient and wife did not express any other questions or concerns about going to SNF for short term rehab.  Employment status:  Retired Nurse, adult PT Recommendations:  [place] / Referral to  community resources:  [place]  Patient/Family's Response to care:  Patient and family agreeable to SNF for short term rehab.  Patient/Family's Understanding of and Emotional Response to Diagnosis, Current Treatment, and Prognosis:  Patient aware of current treatment plan and prognosis.  Emotional Assessment Appearance:  Appears stated age Attitude/Demeanor/Rapport:    Affect (typically observed):  Appropriate, Pleasant, Stable Orientation:  Oriented to Self, Oriented to Place, Oriented to  Time, Oriented to Situation Alcohol / Substance use:  Not Applicable Psych involvement (Current and /or in the community):     Discharge Needs  Concerns to be addressed:  No discharge needs identified Readmission within the last 30 days:  No Current discharge risk:  None Barriers to Discharge:  No Barriers Identified   Ross Ludwig, LCSWA 08/08/2015, 7:14 PM

## 2015-08-08 NOTE — Progress Notes (Signed)
Orthopedic Tech Progress Note Patient Details:  [name] [name] 1945/07/12 [id number]  CPM Right Knee CPM Right Knee: On Right Knee Flexion (Degrees): 90 Right Knee Extension (Degrees): 0 Additional Comments: trapeze bar patient helper Viewed order from doctor's order list  Hildred Priest 08/08/2015, 11:29 AM

## 2015-08-08 NOTE — Discharge Instructions (Signed)

## 2015-08-08 NOTE — NC FL2 (Signed)
[place] LEVEL OF CARE SCREENING TOOL     IDENTIFICATION  Patient Name: [name] [name] Birthdate: 11-May-1946 Sex: male Admission Date (Current Location): 08/08/2015  [facility] and [place] Number:  Herbalist and Address:  [facility] [place], [place] [address] [place], [place] [address]      Provider Number: [id number]  Attending Physician Name and Address:  [name], MD  Relative Name and Phone Number:  Keaden, Witter [phone] or [phone] or Tucker,Kim Daughter [phone] or [phone]    Current Level of Care: Hospital Recommended Level of Care: [place] Prior Approval Number:    Date Approved/Denied:   PASRR Number: [id number]  Discharge Plan: SNF    Current Diagnoses: Patient Active Problem List   Diagnosis Date Noted  . Primary localized osteoarthritis of right knee 08/08/2015  . Cough 12/10/2013  . Abnormality of gait 03/14/2013  . Pulmonary nodule 06/13/2012  . Pre-operative cardiovascular examination 01/27/2011  . HYPERLIPIDEMIA-MIXED 06/18/2009  . CAD, NATIVE VESSEL 06/18/2009  . OBSTRUCTIVE SLEEP APNEA 03/20/2008  . WEIGHT GAIN, ABNORMAL 03/20/2008  . DYSPNEA 03/20/2008  . HYPERTENSION 07/14/2007  . G E R D 07/14/2007    Orientation RESPIRATION BLADDER Height & Weight     Self, Time, Situation, Place  Normal Continent Weight: 282 lb (127.914 kg) Height:  5\' 10"  (177.8 cm)  BEHAVIORAL SYMPTOMS/MOOD NEUROLOGICAL BOWEL NUTRITION STATUS      Continent Diet (Regular)  AMBULATORY STATUS COMMUNICATION OF NEEDS Skin   Limited Assist Verbally Surgical wounds                       Personal Care Assistance Level of Assistance  Bathing, Dressing Bathing Assistance: Limited assistance   Dressing Assistance: Limited assistance     Functional Limitations Info             SPECIAL CARE FACTORS FREQUENCY  PT (By licensed PT)     PT Frequency: 5x a week               Contractures      Additional Factors Info  Allergies, Code Status, Psychotropic Code Status Info: Full  Allergies Info: PENICILLINS, CODEINE, SULFONAMIDE DERIVATIVES Psychotropic Info: Citalopram         Current Medications (08/08/2015):  This is the current hospital active medication list Current Facility-Administered Medications  Medication Dose Route Frequency Provider Last Rate Last Dose  . 0.9 %  sodium chloride infusion   Intravenous Continuous Joshua Chadwell, PA-C      . acetaminophen (TYLENOL) tablet 650 mg  650 mg Oral Q6H PRN Chriss Czar, PA-C       Or  . acetaminophen (TYLENOL) suppository 650 mg  650 mg Rectal Q6H PRN Chriss Czar, PA-C      . citalopram (CELEXA) tablet 20 mg  20 mg Oral Daily Joshua Chadwell, PA-C   20 mg at 08/08/15 1300  . diphenhydrAMINE (BENADRYL) 12.5 MG/5ML elixir 12.5-25 mg  12.5-25 mg Oral Q4H PRN Joshua Chadwell, PA-C      . docusate sodium (COLACE) capsule 100 mg  100 mg Oral BID Joshua Chadwell, PA-C   100 mg at 08/08/15 1340  . [START ON 08/09/2015] enoxaparin (LOVENOX) injection 30 mg  30 mg Subcutaneous Q12H Joshua Chadwell, PA-C      . hydrochlorothiazide (HYDRODIURIL) tablet 25 mg  25 mg Oral Daily Joshua Chadwell, PA-C   25 mg at 08/08/15 1340  . HYDROmorphone (DILAUDID) 1 MG/ML injection           .  HYDROmorphone (DILAUDID) 1 MG/ML injection           . HYDROmorphone (DILAUDID) injection 1 mg  1 mg Intravenous Q2H PRN Chriss Czar, PA-C      . lactated ringers infusion   Intravenous Continuous Myrtie Soman, MD 10 mL/hr at 08/08/15 0740    . [START ON 08/09/2015] levothyroxine (SYNTHROID, LEVOTHROID) tablet 150 mcg  150 mcg Oral QAC breakfast Joshua Chadwell, PA-C      . losartan (COZAAR) tablet 50 mg  50 mg Oral Daily Joshua Chadwell, PA-C   50 mg at 08/08/15 1340  . menthol-cetylpyridinium (CEPACOL) lozenge 3 mg  1 lozenge Oral PRN Joshua Chadwell, PA-C       Or  . phenol (CHLORASEPTIC) mouth spray 1 spray  1 spray  Mouth/Throat PRN Joshua Chadwell, PA-C      . metoCLOPramide (REGLAN) tablet 5-10 mg  5-10 mg Oral Q8H PRN Joshua Chadwell, PA-C       Or  . metoCLOPramide (REGLAN) injection 5-10 mg  5-10 mg Intravenous Q8H PRN Joshua Chadwell, PA-C      . ondansetron (ZOFRAN) tablet 4 mg  4 mg Oral Q6H PRN Joshua Chadwell, PA-C       Or  . ondansetron (ZOFRAN) injection 4 mg  4 mg Intravenous Q6H PRN Joshua Chadwell, PA-C      . oxyCODONE (Oxy IR/ROXICODONE) 5 MG immediate release tablet           . oxyCODONE (Oxy IR/ROXICODONE) immediate release tablet 5-10 mg  5-10 mg Oral Q3H PRN Chriss Czar, PA-C   10 mg at 08/08/15 1525  . pantoprazole (PROTONIX) EC tablet 80 mg  80 mg Oral Daily Joshua Chadwell, PA-C   80 mg at 08/08/15 1300  . polyethylene glycol (MIRALAX / GLYCOLAX) packet 17 g  17 g Oral Daily PRN Chriss Czar, PA-C      . simvastatin (ZOCOR) tablet 20 mg  20 mg Oral q1800 Joshua Chadwell, PA-C   20 mg at 08/08/15 1709  . sodium phosphate (FLEET) 7-19 GM/118ML enema 1 enema  1 enema Rectal Once PRN Joshua Chadwell, PA-C      . sorbitol 70 % solution 30 mL  30 mL Oral Daily PRN Chriss Czar, PA-C      . Trospium Chloride CP24 60 mg  60 mg Oral Daily Joshua Chadwell, PA-C   60 mg at 08/08/15 1300     Discharge Medications: Please see discharge summary for a list of discharge medications.  Relevant Imaging Results:  Relevant Lab Results:   Additional Information SSN [id number]  Ross Ludwig, [place]

## 2015-08-08 NOTE — Transfer of Care (Signed)
Immediate Anesthesia Transfer of Care Note  Patient: [name] [name]  Procedure(s) Performed: Procedure(s): RIGHT TOTAL KNEE ARTHROPLASTY (Right)  Patient Location: PACU  Anesthesia Type:GA combined with regional for post-op pain  Level of Consciousness: awake, oriented and patient cooperative  Airway & Oxygen Therapy: Patient Spontanous Breathing and Patient connected to nasal cannula oxygen  Post-op Assessment: Report given to RN, Post -op Vital signs reviewed and stable and Patient moving all extremities  Post vital signs: Reviewed and stable  Last Vitals:  Filed Vitals:   08/08/15 0825 08/08/15 1058  BP: 121/64 159/92  Pulse: 76 94  Temp:  37.1 C  Resp: 14 17    Complications: No apparent anesthesia complications

## 2015-08-08 NOTE — Interval H&P Note (Signed)
History and Physical Interval Note:  08/08/2015 8:33 AM  [name] [name]  has presented today for surgery, with the diagnosis of OA RIGHT KNEE  The various methods of treatment have been discussed with the patient and family. After consideration of risks, benefits and other options for treatment, the patient has consented to  Procedure(s): RIGHT TOTAL KNEE ARTHROPLASTY (Right) as a surgical intervention .  The patient's history has been reviewed, patient examined, no change in status, stable for surgery.  I have reviewed the patient's chart and labs.  Questions were answered to the patient's satisfaction.     [name],W D

## 2015-08-09 LAB — BASIC METABOLIC PANEL
ANION GAP: 10 (ref 5–15)
BUN: 14 mg/dL (ref 6–20)
CALCIUM: 8.7 mg/dL — AB (ref 8.9–10.3)
CO2: 27 mmol/L (ref 22–32)
CREATININE: 0.84 mg/dL (ref 0.61–1.24)
Chloride: 98 mmol/L — ABNORMAL LOW (ref 101–111)
GLUCOSE: 104 mg/dL — AB (ref 65–99)
Potassium: 3.8 mmol/L (ref 3.5–5.1)
Sodium: 135 mmol/L (ref 135–145)

## 2015-08-09 LAB — CBC
HEMATOCRIT: 37.8 % — AB (ref 39.0–52.0)
Hemoglobin: 12.3 g/dL — ABNORMAL LOW (ref 13.0–17.0)
MCH: 31.1 pg (ref 26.0–34.0)
MCHC: 32.5 g/dL (ref 30.0–36.0)
MCV: 95.5 fL (ref 78.0–100.0)
PLATELETS: 207 10*3/uL (ref 150–400)
RBC: 3.96 MIL/uL — ABNORMAL LOW (ref 4.22–5.81)
RDW: 13.3 % (ref 11.5–15.5)
WBC: 9.8 10*3/uL (ref 4.0–10.5)

## 2015-08-09 MED ORDER — ALUM & MAG HYDROXIDE-SIMETH 200-200-20 MG/5ML PO SUSP
30.0000 mL | ORAL | Status: DC | PRN
  Administered 2015-08-09: 30 mL via ORAL
  Filled 2015-08-09: qty 30

## 2015-08-09 NOTE — Progress Notes (Signed)
Pt c/o indigestion; MD called and new order received. Will continue to monitor pt. Delia Heady RN

## 2015-08-09 NOTE — Progress Notes (Signed)
Patient ID: DAYTRON GULAN, male   DOB: Aug 04, 1945, 70 y.o.   MRN: [id number]     Subjective:  Patient reports pain as mild.  Denies any CP or SOB states the he has had a post op ileus during previous post-op recoveries.  Objective:   VITALS:   Filed Vitals:   08/08/15 1945 08/08/15 2344 08/09/15 0621 08/09/15 0911  BP: 132/67 131/68 109/59 103/58  Pulse: 88 78 74 68  Temp: 99.1 F (37.3 C) 99.7 F (37.6 C) 99.3 F (37.4 C) 99.9 F (37.7 C)  TempSrc: Oral Oral Oral Oral  Resp: 18 18 18 18   Height:      Weight:      SpO2: 94% 93% 95% 99%    ABD soft Sensation intact distally Dorsiflexion/Plantar flexion intact Incision: dressing C/D/I and no drainage Good foot and ankle motion  Positive flatus    Lab Results  Component Value Date   WBC 9.8 08/09/2015   HGB 12.3* 08/09/2015   HCT 37.8* 08/09/2015   MCV 95.5 08/09/2015   PLT 207 08/09/2015   BMET    Component Value Date/Time   NA 135 08/09/2015 0632   NA 136 12/11/2013 0805   K 3.8 08/09/2015 0632   K 4.0 12/11/2013 0805   CL 98* 08/09/2015 0632   CL 102 12/12/2012 0752   CO2 27 08/09/2015 0632   CO2 25 12/11/2013 0805   GLUCOSE 104* 08/09/2015 0632   GLUCOSE 106 12/11/2013 0805   GLUCOSE 109* 12/12/2012 0752   BUN 14 08/09/2015 0632   BUN 14.3 12/11/2013 0805   CREATININE 0.84 08/09/2015 0632   CREATININE 0.9 12/11/2013 0805   CALCIUM 8.7* 08/09/2015 0632   CALCIUM 9.2 12/11/2013 0805   GFRNONAA >60 08/09/2015 0632   GFRAA >60 08/09/2015 [id number]     Assessment/Plan: 1 Day Post-Op   Active Problems:   Primary localized osteoarthritis of right knee   Advance diet Up with therapy WBAT Plan dressing change tomorrow Continue bowel regimen, will transition to clears if any concerns about possible ileus, but currently no clinical signs of this. Plan for SNF Monday   Remonia Richter 08/09/2015, 10:31 AM  Discussed and agree with above.  Marchia Bond, MD Cell [phone]

## 2015-08-09 NOTE — Evaluation (Addendum)
Occupational Therapy Evaluation Patient Details Name: [name] [name] MRN: [id number] DOB: [birth year] Today's Date: 08/09/2015    History of Present Illness 70 y.o. male now s/p Rt TKA. PMH: CAD, depression, PLIF, Lt rotator cuff repair, Lt TKA.    Clinical Impression   Pt reports he was managing ADLs with modified independence and use of AE PTA. Pt with nausea following functional activities; attempted to contact RN x3. Began safety and ADL education with pt and wife. Pt planning d/c to SNF ([facility]) for further rehab prior to return home; agree with SNF placement. Pt would benefit from continued skilled OT to address established goals.    Follow Up Recommendations  SNF;Supervision/Assistance - 24 hour    Equipment Recommendations  None recommended by OT    Recommendations for Other Services       Precautions / Restrictions Precautions Precautions: Knee;Fall Precaution Booklet Issued: No Required Braces or Orthoses: Knee Immobilizer - Right Knee Immobilizer - Right: On when out of bed or walking Restrictions Weight Bearing Restrictions: Yes RLE Weight Bearing: Weight bearing as tolerated      Mobility Bed Mobility Overal bed mobility: Needs Assistance Bed Mobility: Rolling;Sidelying to Sit;Sit to Supine Rolling: Min guard Sidelying to sit: Mod assist (for LEs, heavy use of bed rail)   Sit to supine: Min assist (assist for RLE )   General bed mobility comments: Good technique throughout.  Transfers Overall transfer level: Needs assistance Equipment used: Rolling walker (2 wheeled) Transfers: Sit to/from Stand Sit to Stand: Mod assist         General transfer comment: Mod asssist for sit to stand. KI donned prior to standing. Pt able to take small side steps x3 to [facility] for repositioning. VCs for hand placement and technique.    Balance Overall balance assessment: Needs assistance Sitting-balance support: No upper extremity supported;Feet supported Sitting  balance-Leahy Scale: Good     Standing balance support: Bilateral upper extremity supported Standing balance-Leahy Scale: Poor Standing balance comment: RW for support and manual assist                            ADL Overall ADL's : Needs assistance/impaired Eating/Feeding: Set up;Sitting   Grooming: Set up;Sitting   Upper Body Bathing: Set up;Sitting   Lower Body Bathing: Maximal assistance;Sit to/from stand   Upper Body Dressing : Set up;Sitting   Lower Body Dressing: Maximal assistance;Sit to/from stand   Toilet Transfer: Moderate assistance;+2 for safety/equipment;Stand-pivot;BSC;RW Toilet Transfer Details (indicate cue type and reason): Pt able to side step to [facility] to reposition but unable to attempt safe stand pivot transfer with one person assist. Pt able to sit EOB with min guard for balance to void in urinal.         Functional mobility during ADLs: Moderate assistance;+2 for safety/equipment;Rolling walker General ADL Comments: Pts wife present for OT eval. Educated on safety with RW, use of overhead trapeze for repositioning in bed.      Vision     Perception     Praxis      Pertinent Vitals/Pain Pain Assessment: 0-10 Pain Score: 7  Pain Location: R knee with movement Pain Descriptors / Indicators: Aching;Grimacing;Operative site guarding Pain Intervention(s): Limited activity within patient's tolerance;Monitored during session;Ice applied;Premedicated before session     Hand Dominance     Extremity/Trunk Assessment Upper Extremity Assessment Upper Extremity Assessment: Overall WFL for tasks assessed   Lower Extremity Assessment Lower Extremity Assessment: Defer to PT  evaluation   Cervical / Trunk Assessment Cervical / Trunk Assessment: Other exceptions Cervical / Trunk Exceptions: hx of back sx and pain   Communication Communication Communication: No difficulties   Cognition Arousal/Alertness: Awake/alert Behavior During Therapy:  WFL for tasks assessed/performed Overall Cognitive Status: Within Functional Limits for tasks assessed                     General Comments       Exercises       Shoulder Instructions      Home Living Family/patient expects to be discharged to:: Skilled nursing facility Living Arrangements: Spouse/significant other   Type of Home: House Home Access: Stairs to enter [employer] of Steps: 10 Entrance Stairs-Rails: Left;Right Home Layout: Two level Alternate Level Stairs-Number of Steps: flight   Bathroom Shower/Tub: Tub/shower unit Shower/tub characteristics: Architectural technologist: Standard Bathroom Accessibility: Yes How Accessible: Accessible via walker Home Equipment: [place] - 2 wheels;Walker - 4 wheels;Walker - standard;Cane - quad;Bedside commode;Tub bench;Adaptive equipment;Hand held shower head Adaptive Equipment: Reacher;Sock aid;Long-handled shoe horn;Long-handled sponge        Prior Functioning/Environment Level of Independence: Independent        Comments: Admits to poor stability PTA, frequently running into objects and general instability. States that he did not use any device. Uses AE for LB ADLs.     OT Diagnosis: Generalized weakness;Acute pain   OT Problem List: Decreased strength;Decreased activity tolerance;Impaired balance (sitting and/or standing);Decreased knowledge of use of DME or AE;Decreased knowledge of precautions;Obesity;Pain   OT Treatment/Interventions: Self-care/ADL training;Energy conservation;DME and/or AE instruction;Therapeutic activities;Patient/family education;Balance training    OT Goals(Current goals can be found in the care plan section) Acute Rehab OT Goals Patient Stated Goal: go to rehab before going home OT Goal Formulation: With patient/family Time For Goal Achievement: 08/23/15 Potential to Achieve Goals: Good ADL Goals Pt Will Perform Grooming: with min assist;standing Pt Will Perform Lower Body  Bathing: with min assist;with adaptive equipment;sit to/from stand Pt Will Perform Lower Body Dressing: with min assist;with adaptive equipment;sit to/from stand Pt Will Transfer to Toilet: with min assist;with +2 assist;ambulating;bedside commode (over toilet) Pt Will Perform Toileting - Clothing Manipulation and hygiene: with min assist;sit to/from stand  OT Frequency: Min 2X/week   Barriers to D/C: Inaccessible home environment          Co-evaluation              End of Session Equipment Utilized During Treatment: Gait belt;Rolling walker;Right knee immobilizer CPM Right Knee CPM Right Knee: On Right Knee Flexion (Degrees): 70 Right Knee Extension (Degrees): 0  Activity Tolerance: Patient limited by pain Patient left: in bed;with call bell/phone within reach;with family/visitor present   Time: 1440-1501 OT Time Calculation (min): 21 min Charges:  OT General Charges $OT Visit: 1 Procedure OT Evaluation $OT Eval Moderate Complexity: 1 Procedure G-Codes:     Binnie Kand M.S., OTR/L Pager: [phone]  08/09/2015, 3:23 PM

## 2015-08-09 NOTE — Progress Notes (Signed)
Physical Therapy Treatment Patient Details Name: [name] [name] MRN: [id number] DOB: 08-01-1945 Today's Date: 08/09/2015    History of Present Illness 70 y.o. male now s/p Rt TKA. PMH: CAD, depression, PLIF, Lt rotator cuff repair, Lt TKA.     PT Comments    Patient progressing with ambulation and exercise this session.  Feel SNF level rehab remains appropriate.  Will continue skilled PT in the acute setting to facilitate decreased burden of care and eventual d/c home with family support.  Follow Up Recommendations  SNF;Supervision for mobility/OOB     Equipment Recommendations  None recommended by PT    Recommendations for Other Services       Precautions / Restrictions Precautions Precautions: Knee;Fall Precaution Booklet Issued: No Required Braces or Orthoses: Knee Immobilizer - Right Knee Immobilizer - Right: On when out of bed or walking Restrictions Weight Bearing Restrictions: Yes RLE Weight Bearing: Weight bearing as tolerated    Mobility  Bed Mobility   General bed mobility comments: up in chair  Transfers Overall transfer level: Needs assistance Equipment used: Rolling walker (2 wheeled) Transfers: Sit to/from Stand Sit to Stand: Mod assist;+2 physical assistance         General transfer comment: increased time to come up to standing, cues for hand placement  Ambulation/Gait Ambulation/Gait assistance: Min assist;+2 safety/equipment (one to follow with chair) Ambulation Distance (Feet): 20 Feet Assistive device: Rolling walker (2 wheeled) Gait Pattern/deviations: Step-to pattern;Decreased stride length;Antalgic     General Gait Details: cues and assist for walker positioning, step length, posture, two standing rest breaks due to arm fatigue   Stairs            Wheelchair Mobility    Modified Rankin (Stroke Patients Only)       Balance Overall balance assessment: Needs assistance Sitting-balance support: No upper extremity  supported;Feet supported Sitting balance-Leahy Scale: Good     Standing balance support: Bilateral upper extremity supported Standing balance-Leahy Scale: Poor Standing balance comment: needs RW for balance and minguard                    Cognition Arousal/Alertness: Awake/alert Behavior During Therapy: WFL for tasks assessed/performed Overall Cognitive Status: Within Functional Limits for tasks assessed                      Exercises Total Joint Exercises Ankle Circles/Pumps: AROM;Both;10 reps;Seated Quad Sets: AROM;Right;10 reps;Seated Heel Slides: AROM;Right;10 reps;Seated (foot on the floor) Straight Leg Raises: AAROM;Right;10 reps;Seated Goniometric ROM: extension approx -15; flexion approx 45    General Comments        Pertinent Vitals/Pain Pain Assessment: 0-10 Pain Score: 7  Pain Location: R knee Pain Descriptors / Indicators: Aching;Discomfort;Operative site guarding Pain Intervention(s): Limited activity within patient's tolerance;Monitored during session;Ice applied    Home Living      Prior Function    PT Goals (current goals can now be found in the care plan section) Acute Rehab PT Goals Patient Stated Goal: go to rehab before going home Progress towards PT goals: Progressing toward goals    Frequency  7X/week    PT Plan Current plan remains appropriate    Co-evaluation             End of Session Equipment Utilized During Treatment: Gait belt;Right knee immobilizer Activity Tolerance: Patient limited by fatigue Patient left: with call bell/phone within reach;in chair     Time: 1030-1056 PT Time Calculation (min) (ACUTE ONLY): 26 min  Charges:  $  Gait Training: 8-22 mins $Therapeutic Exercise: 8-22 mins                    G Codes:      Reginia Naas 09-08-2015, 3:53 PM  Magda Kiel, [place] 09/08/2015

## 2015-08-10 LAB — CBC
HCT: 36.6 % — ABNORMAL LOW (ref 39.0–52.0)
Hemoglobin: 12.1 g/dL — ABNORMAL LOW (ref 13.0–17.0)
MCH: 30.9 pg (ref 26.0–34.0)
MCHC: 33.1 g/dL (ref 30.0–36.0)
MCV: 93.4 fL (ref 78.0–100.0)
PLATELETS: 205 10*3/uL (ref 150–400)
RBC: 3.92 MIL/uL — AB (ref 4.22–5.81)
RDW: 13 % (ref 11.5–15.5)
WBC: 10.4 10*3/uL (ref 4.0–10.5)

## 2015-08-10 NOTE — Progress Notes (Signed)
Physical Therapy Treatment Patient Details Name: [name] [name] MRN: [id number] DOB: 1945-09-15 Today's Date: 08/10/2015    History of Present Illness 70 y.o. male now s/p Rt TKA. PMH: CAD, depression, PLIF, Lt rotator cuff repair, Lt TKA.     PT Comments    Mr. Orlov is progressing modestly, ambulating 20 ft w/ min assist and RW today.  Noted that Rt LE from just superior to knee down to Rt ankle w/ increased redness and swelling.  RN notified who is notifying MD for concern of infection or DVT (see general notes below).  Pt will benefit from continued skilled PT services to increase functional independence and safety.   Follow Up Recommendations  SNF;Supervision for mobility/OOB     Equipment Recommendations  None recommended by PT    Recommendations for Other Services       Precautions / Restrictions Precautions Precautions: Knee;Fall Required Braces or Orthoses: Knee Immobilizer - Right Knee Immobilizer - Right: On when out of bed or walking Restrictions Weight Bearing Restrictions: Yes RLE Weight Bearing: Weight bearing as tolerated    Mobility  Bed Mobility Overal bed mobility: Needs Assistance Bed Mobility: Supine to Sit     Supine to sit: Min assist;HOB elevated     General bed mobility comments: Pt performs valsalva maneuver and requires verbal cues for proper breathing technique.  Min assist managing Rt LE.  Pt w/ heavy use of bed rail w/ HOB elevated.   Transfers Overall transfer level: Needs assistance Equipment used: Rolling walker (2 wheeled) Transfers: Sit to/from Stand Sit to Stand: +2 physical assistance;Mod assist;From elevated surface         General transfer comment: Pt slow to stand and requires assist to boost up to standing.  Cues for proper hand placement  Ambulation/Gait Ambulation/Gait assistance: Min assist;+2 safety/equipment Ambulation Distance (Feet): 20 Feet Assistive device: Rolling walker (2 wheeled) Gait Pattern/deviations:  Step-to pattern;Decreased stride length;Decreased weight shift to right;Decreased stance time - right;Antalgic;Trunk flexed;Shuffle   Gait velocity interpretation: <1.8 ft/sec, indicative of risk for recurrent falls General Gait Details: Cues to for foot clearance w/ Lt LE as pt was shuffling her feet, pt has difficulty w/ this due to Rt knee pain w/ increased WB.  Pt fatigues quickly and requires min assist for management of RW and to steady.   Stairs            Wheelchair Mobility    Modified Rankin (Stroke Patients Only)       Balance Overall balance assessment: Needs assistance Sitting-balance support: Bilateral upper extremity supported;Feet supported Sitting balance-Leahy Scale: Fair     Standing balance support: Bilateral upper extremity supported;During functional activity Standing balance-Leahy Scale: Poor Standing balance comment: Relies heavily on RW for support                    Cognition Arousal/Alertness: Awake/alert Behavior During Therapy: WFL for tasks assessed/performed Overall Cognitive Status: Within Functional Limits for tasks assessed                      Exercises Total Joint Exercises Ankle Circles/Pumps: AROM;Both;10 reps;Supine Quad Sets: Strengthening;Both;10 reps;Supine Straight Leg Raises: AAROM;Right;5 reps;Supine Goniometric ROM: extension approx -15 deg    General Comments General comments (skin integrity, edema, etc.): Noted that Rt LE from just superior to knee down to Rt ankle was red w/ increased warmth and swelling.  Homan's sign negative and no reaction w/ squeezing of calf.  Pt performs valsalva maneuver during transfers  which causes his to be SOB but wife says this is since surgery.  SpO2 95%.  Pt and wife instructed to inform RN immediately if he develops sharp calf pain and/or SOB.  RN called to room who inspected Rt knee as well and reported she was going to call the MD.  Damaris Schooner w/ nurse tech about transfers only  for now, to avoid ambulation until symptoms further investigated.      Pertinent Vitals/Pain Pain Assessment: Faces Faces Pain Scale: Hurts even more Pain Location: all around the Rt knee Pain Descriptors / Indicators: Aching;Grimacing ("sharp when it's real bad") Pain Intervention(s): Limited activity within patient's tolerance;Monitored during session;Repositioned    Home Living                      Prior Function            PT Goals (current goals can now be found in the care plan section) Acute Rehab PT Goals Patient Stated Goal: none stated PT Goal Formulation: With patient Time For Goal Achievement: 08/22/15 Potential to Achieve Goals: Good Progress towards PT goals: Progressing toward goals (modestly)    Frequency  7X/week    PT Plan Current plan remains appropriate    Co-evaluation             End of Session Equipment Utilized During Treatment: Gait belt;Right knee immobilizer Activity Tolerance: Patient limited by fatigue Patient left: in chair;with call bell/phone within reach;with family/visitor present     Time: [id number] PT Time Calculation (min) (ACUTE ONLY): 25 min  Charges:  $Gait Training: 8-22 mins $Therapeutic Exercise: 8-22 mins                    G Codes:      Joslyn Hy PT, DPT [phone] Pager: [phone] 08/10/2015, 1:25 PM

## 2015-08-10 NOTE — Progress Notes (Signed)
Pt RLE (Knee) noted to be slightly edematous compared to the LLE. Skin warm to touch and color pink but mepilex dsg to knee clean, dry and intact with no stain or active bleeding. Dr. Mardelle Matte called and notified; no new orders received; MD ordered to continue to observe pt. Will closely monitor pt. VSS. Delia Heady RN

## 2015-08-10 NOTE — Progress Notes (Signed)
Patient ID: [name] [name], male   DOB: 11/22/1945, 70 y.o.   MRN: [id number]     Subjective:  Patient reports pain as mild to moderate.  Patient states that he is better and has been able to have a bowel movement.  Denies any nausea, CP or SOB  Objective:   VITALS:   Filed Vitals:   08/09/15 1300 08/09/15 2059 08/09/15 2245 08/10/15 0550  BP: 105/49 153/70  130/74  Pulse: 79 91  88  Temp: 99.2 F (37.3 C) 100.7 F (38.2 C) 99.6 F (37.6 C) 100.2 F (37.9 C)  TempSrc:  Oral Oral Oral  Resp: 18 18  16   Height:      Weight:      SpO2: 95% 93%  92%    ABD soft Sensation intact distally Dorsiflexion/Plantar flexion intact Incision: dressing C/D/I and no drainage Dressing removed and wound is good   Lab Results  Component Value Date   WBC 10.4 08/10/2015   HGB 12.1* 08/10/2015   HCT 36.6* 08/10/2015   MCV 93.4 08/10/2015   PLT 205 08/10/2015   BMET    Component Value Date/Time   NA 135 08/09/2015 0632   NA 136 12/11/2013 0805   K 3.8 08/09/2015 0632   K 4.0 12/11/2013 0805   CL 98* 08/09/2015 0632   CL 102 12/12/2012 0752   CO2 27 08/09/2015 0632   CO2 25 12/11/2013 0805   GLUCOSE 104* 08/09/2015 0632   GLUCOSE 106 12/11/2013 0805   GLUCOSE 109* 12/12/2012 0752   BUN 14 08/09/2015 0632   BUN 14.3 12/11/2013 0805   CREATININE 0.84 08/09/2015 0632   CREATININE 0.9 12/11/2013 0805   CALCIUM 8.7* 08/09/2015 0632   CALCIUM 9.2 12/11/2013 0805   GFRNONAA >60 08/09/2015 0632   GFRAA >60 08/09/2015 [id number]     Assessment/Plan: 2 Days Post-Op   Active Problems:   Primary localized osteoarthritis of right knee   Advance diet Up with therapy Plan for discharge tomorrow Patient planning for SNF WBAT Dry dressing PRN   DOUGLAS PARRY, BRANDON 08/10/2015, 11:21 AM  Discussed and agree with above.   Marchia Bond, MD Cell [phone]

## 2015-08-11 ENCOUNTER — Encounter (HOSPITAL_COMMUNITY): Payer: Self-pay | Admitting: Orthopedic Surgery

## 2015-08-11 LAB — CBC
HCT: 38.7 % — ABNORMAL LOW (ref 39.0–52.0)
HEMOGLOBIN: 12.3 g/dL — AB (ref 13.0–17.0)
MCH: 30.4 pg (ref 26.0–34.0)
MCHC: 31.8 g/dL (ref 30.0–36.0)
MCV: 95.6 fL (ref 78.0–100.0)
PLATELETS: 217 10*3/uL (ref 150–400)
RBC: 4.05 MIL/uL — AB (ref 4.22–5.81)
RDW: 13.1 % (ref 11.5–15.5)
WBC: 8.9 10*3/uL (ref 4.0–10.5)

## 2015-08-11 NOTE — Care Management Important Message (Signed)
Important Message  Patient Details  Name: [name] [name] MRN: [id number] Date of Birth: Apr 05, 1946   Medicare Important Message Given:  Yes    [name] 08/11/2015, 1:49 PM

## 2015-08-11 NOTE — Progress Notes (Signed)
Physical Therapy Treatment Patient Details Name: [name] [name] MRN: [id number] DOB: [birth year] Today's Date: 08/11/2015    History of Present Illness 70 y.o. male now s/p Rt TKA. PMH: CAD, depression, PLIF, Lt rotator cuff repair, Lt TKA.     PT Comments    Pt with increased progression with transfers and HEP and slight improvement with gait. Pt continues to be limited by fatigue. Encouraged increase mobility with nursing and HEP throughout the day. Educated for no pillow under knee as pt states he was unaware as well as KI use with gait. Will continue to follow.   Follow Up Recommendations  SNF;Supervision for mobility/OOB     Equipment Recommendations       Recommendations for Other Services       Precautions / Restrictions Precautions Precautions: Knee;Fall Required Braces or Orthoses: Knee Immobilizer - Right Knee Immobilizer - Right: On when out of bed or walking Restrictions Weight Bearing Restrictions: Yes RLE Weight Bearing: Weight bearing as tolerated    Mobility  Bed Mobility Overal bed mobility: Modified Independent             General bed mobility comments: bed flat and use or rail to pivot to EOB and heavy reliance on rail to elevate trunk from surface  Transfers Overall transfer level: Needs assistance   Transfers: Sit to/from Stand Sit to Stand: Min assist         General transfer comment: cues for hand placement, safety and sequence with 2 trials performed  Ambulation/Gait Ambulation/Gait assistance: Min assist Ambulation Distance (Feet): 30 Feet Assistive device: Rolling walker (2 wheeled) Gait Pattern/deviations: Step-through pattern;Decreased stride length;Trunk flexed   Gait velocity interpretation: Below normal speed for age/gender General Gait Details: cues for posture, position in RW and increased stride. pt walked 30', 15' with seated rest between trials. Too fatigued after stairs to ambulate further   Stairs Stairs: Yes Stairs  assistance: +2 safety/equipment Stair Management: Backwards;With walker Number of Stairs: 3 General stair comments: max cues for sequence with assist for RW stability and positioning with each step  Wheelchair Mobility    Modified Rankin (Stroke Patients Only)       Balance                                    Cognition Arousal/Alertness: Awake/alert Behavior During Therapy: WFL for tasks assessed/performed Overall Cognitive Status: Within Functional Limits for tasks assessed                      Exercises Total Joint Exercises Short Arc Quad: AROM;Right;15 reps;Supine Heel Slides: Right;Seated;AAROM;15 reps Straight Leg Raises: AAROM;Right;Supine;10 reps Goniometric ROM: 10-55    General Comments        Pertinent Vitals/Pain Pain Score: 4  Pain Location: right knee Pain Descriptors / Indicators: Aching Pain Intervention(s): Limited activity within patient's tolerance;Premedicated before session;Repositioned    Home Living                      Prior Function            PT Goals (current goals can now be found in the care plan section) Progress towards PT goals: Progressing toward goals    Frequency       PT Plan Current plan remains appropriate    Co-evaluation             End of Session Equipment  Utilized During Treatment: Gait belt;Right knee immobilizer Activity Tolerance: Patient limited by fatigue Patient left: in chair;with call bell/phone within reach;with family/visitor present     Time: 0755-0822 PT Time Calculation (min) (ACUTE ONLY): 27 min  Charges:  $Gait Training: 8-22 mins $Therapeutic Exercise: 8-22 mins                    G Codes:      Melford Aase 2015/08/26, 10:29 AM Elwyn Reach, [place]

## 2015-08-11 NOTE — Clinical Social Work Note (Signed)
Patient to be d/c'ed today to [facility].  Patient and family agreeable to plans will transport via ems RN to call report to [phone].  Jacqulyne Gladue, MSW, LCSWA [phone]  

## 2015-08-11 NOTE — Progress Notes (Signed)
Subjective: 3 Days Post-Op Procedure(s) (LRB): RIGHT TOTAL KNEE ARTHROPLASTY (Right) Patient reports pain as mild and moderate.  C/o blood on pad on bed and chair  Objective: Vital signs in last 24 hours: Temp:  [98.9 F (37.2 C)-99.6 F (37.6 C)] 99.5 F (37.5 C) (02/13 0519) Pulse Rate:  [83-92] 92 (02/13 0519) Resp:  [18] 18 (02/13 0519) BP: (109-137)/(61-73) 109/73 mmHg (02/13 0519) SpO2:  [91 %-92 %] 91 % (02/13 0519)  Intake/Output from previous day: 02/12 0701 - 02/13 0700 In: 840 [P.O.:840] Out: 1100 [Urine:1100] Intake/Output this shift:     Recent Labs  08/08/15 1306 08/09/15 0632 08/10/15 0315 08/11/15 0408  HGB 14.0 12.3* 12.1* 12.3*    Recent Labs  08/10/15 0315 08/11/15 0408  WBC 10.4 8.9  RBC 3.92* 4.05*  HCT 36.6* 38.7*  PLT 205 217    Recent Labs  08/08/15 1306 08/09/15 0632  NA  --  135  K  --  3.8  CL  --  98*  CO2  --  27  BUN  --  14  CREATININE 0.91 0.84  GLUCOSE  --  104*  CALCIUM  --  8.7*   No results for input(s): LABPT, INR in the last 72 hours.  Neurovascular intact Sensation intact distally Intact pulses distally Dorsiflexion/Plantar flexion intact Incision: dressing C/D/I and no drainage No cellulitis present Compartment soft  No blood from incision, did have blood in rectal area visual hemorrhoids and skin tags likely source Swelling and edema in RLE, no significant calf TTP, mild erythema around incision no concern for cellulitis at this time   Assessment/Plan: 3 Days Post-Op Procedure(s) (LRB): RIGHT TOTAL KNEE ARTHROPLASTY (Right) Up with therapy Discharge to SNF  [name] [name] 08/11/2015, 8:59 AM

## 2015-08-11 NOTE — Progress Notes (Signed)
Pt DCd via EMS at 14:20. IV DCd. Wife with patient - she stated they have all belongings with them. Pt in no distress - medicated with pain meds before DC.

## 2015-08-11 NOTE — Discharge Summary (Signed)
PATIENT ID: [name] [name]        MRN:  [id number]          DOB/AGE: Dec 11, 1945 / 70 y.o.    DISCHARGE SUMMARY  ADMISSION DATE:    08/08/2015 DISCHARGE DATE:   08/11/2015   ADMISSION DIAGNOSIS: OA RIGHT KNEE    DISCHARGE DIAGNOSIS:  PRIMARY LOCALIZED OSTEOARTHRITIS RIGHT KNEE    ADDITIONAL DIAGNOSIS: Active Problems:   Primary localized osteoarthritis of right knee  Past Medical History  Diagnosis Date  . Observed sleep apnea     can't wear cpap  . HTN (hypertension)   . GERD (gastroesophageal reflux disease)   . Dyspnea     -PFTs compeltely normal 03/20/08 including DLc0  . Sleep apnea     Most recent sleep study 2010; records at [facility] office  . CAD (coronary artery disease)     cath 3/10: mLAD 30%, pRCA 30%, EF 55-60% Dr. Harrington Challenger cardiologist  most recent  stress stress done ~ 2 years ago with Dr. Harrington Challenger  . Hypothyroidism   . Low testosterone   . Arthritis   . Depression   . Polycythemia   . Pulmonary nodule 06/13/2012  . Rosacea   . PONV (postoperative nausea and vomiting)   . Complication of anesthesia     "he usually gets an ileus after back OR"   . Environmental allergies     Dust, Smoke    PROCEDURE: Procedure(s): RIGHT TOTAL KNEE ARTHROPLASTY Right on 08/08/2015  CONSULTS: PT/OT/SW      HISTORY:  See H&P in chart  HOSPITAL COURSE:  [name] [name] is a 70 y.o. admitted on 08/08/2015 and found to have a diagnosis of [place].  After appropriate laboratory studies were obtained  they were taken to the operating room on 08/08/2015 and underwent  Procedure(s): RIGHT TOTAL KNEE ARTHROPLASTY  Right.   They were given perioperative antibiotics:  Anti-infectives    Start     Dose/Rate Route Frequency Ordered Stop   08/08/15 1800  vancomycin (VANCOCIN) IVPB 1000 mg/200 mL premix     1,000 mg 200 mL/hr over 60 Minutes Intravenous Every 12 hours 08/08/15 1246 08/08/15 1812   08/08/15 0745  vancomycin (VANCOCIN) 1,500 mg in sodium  chloride 0.9 % 500 mL IVPB     1,500 mg 250 mL/hr over 120 Minutes Intravenous To [facility]Stay [facility] 08/07/15 1155 08/08/15 0940    .  Tolerated the procedure well.    POD #1, allowed out of bed to a chair.  PT for ambulation and exercise program.  IV saline locked.  O2 discontionued.  POD #2, continued PT and ambulation.    The remainder of the hospital course was dedicated to ambulation and strengthening.   The patient was discharged on 3 Days Post-Op in  Stable condition.  Blood products given:none  DIAGNOSTIC STUDIES: Recent vital signs: Patient Vitals for the past 24 hrs:  BP Temp Temp src Pulse Resp SpO2  08/11/15 0519 109/73 mmHg 99.5 F (37.5 C) Oral 92 18 91 %  08/10/15 2039 137/72 mmHg 99.6 F (37.6 C) Oral 85 18 91 %  08/10/15 1230 127/61 mmHg 98.9 F (37.2 C) Oral 83 18 92 %       Recent laboratory studies:  Recent Labs  08/08/15 1306 08/09/15 0632 08/10/15 0315 08/11/15 0408  WBC 12.1* 9.8 10.4 8.9  HGB 14.0 12.3* 12.1* 12.3*  HCT 42.0 37.8* 36.6* 38.7*  PLT 191 207 205 217    Recent  Labs  08/08/15 1306 08/09/15 0632  NA  --  135  K  --  3.8  CL  --  98*  CO2  --  27  BUN  --  14  CREATININE 0.91 0.84  GLUCOSE  --  104*  CALCIUM  --  8.7*   Lab Results  Component Value Date   INR 1.03 07/29/2015   INR 0.99 02/10/2011   INR 1.0 RATIO 08/22/2008     Recent Radiographic Studies :  Dg Chest 2 View  07/29/2015  CLINICAL DATA:  Smoker quit in the 1980s. Smoke 1.5 packs per day for 20 years. Preop total knee arthroplasty. EXAM: CHEST  2 VIEW COMPARISON:  CT chest 12/11/2013 FINDINGS: There is a 5 mm right upper lobe pulmonary nodule similar in appearance when correlated with prior CT of the chest dated 12/11/2013. There is no focal parenchymal opacity. There is no pleural effusion or pneumothorax. The heart and mediastinal contours are unremarkable. The osseous structures are unremarkable. IMPRESSION: No active cardiopulmonary disease.  Electronically Signed   By: Kathreen Devoid   On: 07/29/2015 13:18    DISCHARGE INSTRUCTIONS:   DISCHARGE MEDICATIONS:     Medication List    STOP taking these medications        aspirin 81 MG tablet     ibuprofen 200 MG tablet  Commonly known as:  ADVIL,MOTRIN      TAKE these medications        citalopram 20 MG tablet  Commonly known as:  CELEXA  Take 20 mg by mouth daily.     enoxaparin 30 MG/0.3ML injection  Commonly known as:  LOVENOX  Inject 0.3 mLs (30 mg total) into the skin every 12 (twelve) hours.     hydrochlorothiazide 25 MG tablet  Commonly known as:  HYDRODIURIL  Take 25 mg by mouth daily.     levothyroxine 150 MCG tablet  Commonly known as:  SYNTHROID, LEVOTHROID  Take 150 mcg by mouth daily before breakfast.     losartan 50 MG tablet  Commonly known as:  COZAAR  Take 50 mg by mouth daily.     MIRALAX packet  Generic drug:  polyethylene glycol  Take 17 g by mouth daily.     omeprazole 40 MG capsule  Commonly known as:  PRILOSEC  Take 1 capsule (40 mg total) by mouth daily before breakfast. 30 minutes before     oxyCODONE-acetaminophen 5-325 MG tablet  Commonly known as:  ROXICET  Take 1-2 tablets by mouth every 4 (four) hours as needed for severe pain.     simvastatin 20 MG tablet  Commonly known as:  ZOCOR  Take 1 tablet (20 mg total) by mouth daily at 6 PM.     Trospium Chloride 60 MG Cp24  Take 60 mg by mouth daily.        FOLLOW UP VISIT:       Follow-up Information    Follow up with CAFFREY JR,W D, MD. Schedule an appointment as soon as possible for a visit in 2 weeks.   Specialty:  Orthopedic Surgery   Contact information:   [place] [id number] [phone]       DISPOSITION:   Skilled Nursing Facility/Rehab  CONDITION:  Stable   Chriss Czar, PA-C  08/11/2015 8:52 AM

## 2015-08-14 LAB — CBC AND DIFFERENTIAL
HCT: 38 % — AB (ref 41–53)
HEMOGLOBIN: 12.2 g/dL — AB (ref 13.5–17.5)
PLATELETS: 305 10*3/uL (ref 150–399)
WBC: 7.3 10*3/mL

## 2015-08-14 LAB — BASIC METABOLIC PANEL
BUN: 13 mg/dL (ref 4–21)
CREATININE: 0.8 mg/dL (ref 0.6–1.3)
Glucose: 89 mg/dL
POTASSIUM: 4.5 mmol/L (ref 3.4–5.3)
Sodium: 137 mmol/L (ref 137–147)

## 2015-08-14 LAB — HEPATIC FUNCTION PANEL
ALT: 24 U/L (ref 10–40)
AST: 29 U/L (ref 14–40)
Alkaline Phosphatase: 56 U/L (ref 25–125)
Bilirubin, Total: 0.9 mg/dL

## 2015-08-15 ENCOUNTER — Non-Acute Institutional Stay (SKILLED_NURSING_FACILITY): Payer: Medicare Other | Admitting: Adult Health

## 2015-08-15 ENCOUNTER — Encounter: Payer: Self-pay | Admitting: Adult Health

## 2015-08-15 DIAGNOSIS — K219 Gastro-esophageal reflux disease without esophagitis: Secondary | ICD-10-CM | POA: Diagnosis not present

## 2015-08-15 DIAGNOSIS — K5901 Slow transit constipation: Secondary | ICD-10-CM

## 2015-08-15 DIAGNOSIS — I10 Essential (primary) hypertension: Secondary | ICD-10-CM | POA: Diagnosis not present

## 2015-08-15 DIAGNOSIS — E785 Hyperlipidemia, unspecified: Secondary | ICD-10-CM

## 2015-08-15 DIAGNOSIS — Z96651 Presence of right artificial knee joint: Secondary | ICD-10-CM

## 2015-08-15 DIAGNOSIS — M1711 Unilateral primary osteoarthritis, right knee: Secondary | ICD-10-CM | POA: Diagnosis not present

## 2015-08-15 NOTE — Progress Notes (Signed)
Patient ID: [name] [name], male   DOB: 06/20/1946, 70 y.o.   MRN: [id number]   Facility: [facility]        Allergies  Allergen Reactions  . Penicillins Hives and Other (See Comments)    "I break out in welts"  . Codeine Nausea And Vomiting  . Sulfonamide Derivatives Nausea And Vomiting    Chief Complaint  Patient presents with  . Hospitalization Follow-up    Hospital Follow up    HPI:  He has been hospitalized for a right knee replacement. He is here for short term rehab and will return back home. He says that his pain is being adequately managed. There are no nursing concerns at this time.    Past Medical History  Diagnosis Date  . Observed sleep apnea     can't wear cpap  . HTN (hypertension)   . GERD (gastroesophageal reflux disease)   . Dyspnea     -PFTs compeltely normal 03/20/08 including DLc0  . Sleep apnea     Most recent sleep study 2010; records at [facility] office  . CAD (coronary artery disease)     cath 3/10: mLAD 30%, pRCA 30%, EF 55-60% Dr. Harrington Challenger cardiologist  most recent  stress stress done ~ 2 years ago with Dr. Harrington Challenger  . Hypothyroidism   . Low testosterone   . Arthritis   . Depression   . Polycythemia   . Pulmonary nodule 06/13/2012  . Rosacea   . PONV (postoperative nausea and vomiting)   . Complication of anesthesia     "he usually gets an ileus after back OR"   . Environmental allergies     Dust, Smoke    Past Surgical History  Procedure Laterality Date  . Cataract extraction, bilateral  2010  . Meniscus repair  08/2008    left  . Tonsillectomy  09/21/11  . Uvulopalatopharyngoplasty, tonsillectomy and septoplasty  09/21/11    Deviated Septum  . Posterior fusion lumbar spine  2005; 01/2007; 04/2009    L5; L3-4; L2-3  . Lumbar disc surgery  06/2006    L3  . Back surgery  2005; 06/2006; 01/2007; 04/2009  . Prostate surgery  2000  . Transurethral resection of prostate  2006    followed by "surgery to get rid of clots"  .  Shoulder open rotator cuff repair Left 1999    left  . Cervical fusion  04/2002    ?C3-4  . 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 Cardiac catheterization  08/2008  . Esophagogastroduodenoscopy  multiple    last 07/2012 GERD esophagitis, 48 Fr dilation  . Colonoscopy    . Knee arthroscopy with medial menisectomy Right 05/14/2015    Procedure: RIGHT KNEE ARTHROSCOPY CHONDROPLASTY, PARTIAL  MEDIAL MENISECTOMY;  Surgeon: [name], MD;  Location: [place];  Service: Orthopedics;  Laterality: Right;  . Total knee arthroplasty Right 08/08/2015    Procedure: RIGHT TOTAL KNEE ARTHROPLASTY;  Surgeon: [name], MD;  Location: [place];  Service: Orthopedics;  Laterality: Right;    VITAL SIGNS BP 125/66 mmHg  Pulse 70  Temp(Src) 98.5 F (36.9 C) (Oral)  Resp 20  SpO2 97%  Patient's Medications  New Prescriptions   No medications on file  Previous Medications   CITALOPRAM (CELEXA) 20 MG TABLET    Take 20 mg by mouth daily.   ENOXAPARIN (LOVENOX) 30 MG/0.3ML INJECTION    Inject 0.3 mLs (30 mg total) into the skin  every 12 (twelve) hours.   HYDROCHLOROTHIAZIDE (HYDRODIURIL) 25 MG TABLET    Take 25 mg by mouth daily.    LEVOTHYROXINE (SYNTHROID, LEVOTHROID) 150 MCG TABLET    Take 150 mcg by mouth daily before breakfast.   LOSARTAN (COZAAR) 50 MG TABLET    Take 50 mg by mouth daily.   OMEPRAZOLE (PRILOSEC) 40 MG CAPSULE    Take 1 capsule (40 mg total) by mouth daily before breakfast. 30 minutes before   OXYCODONE-ACETAMINOPHEN (ROXICET) 5-325 MG TABLET    Take 1-2 tablets by mouth every 4 (four) hours as needed for severe pain.   POLYETHYLENE GLYCOL (MIRALAX) PACKET    Take 17 g by mouth daily.     SENNOSIDES-DOCUSATE SODIUM (SENOKOT-S) 8.6-50 MG TABLET    Take 1 tablet by mouth 2 (two) times daily.   SIMVASTATIN (ZOCOR) 20 MG TABLET    Take 1 tablet (20 mg total) by mouth daily at 6 PM.   TRIAMCINOLONE CREAM (KENALOG) 0.1 %    Apply 1 application topically 2  (two) times daily. Apply to back and buttocks for rash.   TROSPIUM CHLORIDE 60 MG CP24    Take 60 mg by mouth daily.  Modified Medications   No medications on file  Discontinued Medications   No medications on file     SIGNIFICANT DIAGNOSTIC EXAMS  07-18-15: 2-d echo: Left ventricle: The cavity size was normal. There was mild   concentric hypertrophy. The estimated ejection fraction was 55%. - Atrial septum: No defect or patent foramen ovale was identified.  07-29-15: chest x-[place]: No active cardiopulmonary disease.   LABS REVIEWED:   08-11-15: wbc 8.9; hgb 12.3; hct 38.7; mcv 95.6; plt 217 08-14-15: wbc 7.3; hgb 12.2; hct 37.6; mcv 95.2; plt 305; glucose 89; bun 13; creat 0.81; k+ 4.5; na++137; liver normal albumin 3.3   Review of Systems  Constitutional: Negative for malaise/fatigue.  Respiratory: Negative for cough and shortness of breath.   Cardiovascular: Negative for chest pain, palpitations and leg swelling.  Gastrointestinal: Negative for heartburn, abdominal pain and constipation.  Musculoskeletal: Negative for myalgias, back pain and joint pain.       Right knee pain is being managed   Skin: Negative.   Neurological: Negative for dizziness.  Psychiatric/Behavioral: The patient is not nervous/anxious.      Physical Exam  Constitutional: He is oriented to person, place, and time. He appears well-developed and well-nourished. No distress.  Eyes: Conjunctivae are normal.  Neck: Neck supple. No JVD present. No thyromegaly present.  Cardiovascular: Normal rate, regular rhythm and intact distal pulses.   Respiratory: Effort normal and breath sounds normal. No respiratory distress. He has no wheezes.  GI: Soft. Bowel sounds are normal. He exhibits no distension. There is no tenderness.  Musculoskeletal: He exhibits edema.  Able to move all extremities  Is status post right knee replacement Has right leg edema present    Lymphadenopathy:    He has no cervical adenopathy.    Neurological: He is alert and oriented to person, place, and time.  Skin: Skin is warm and dry. He is not diaphoretic.  Right knee incision line without signs of infection present   Psychiatric: He has a normal mood and affect.      ASSESSMENT/ PLAN:  1.  Hypertension: will continue hctz 25 mg daily cozaar 50 mg daily   2. Dyslipidemia: will continue zocor 20 mg daily   3. Hypothyroidism: will continue synthroid 150 mcg daily   4. Gerd: will continue prilosec  40 mg daily   5. Constipation: will continue miralax daily and senna s twice daily   6. Depression will continue celexa 20 mg daily   7. Right knee osteoarthritis: is status post right knee replacement; will continue therapy as directed and will follow up with orthopedics as directed; will continue lovenox 30 mg daily through 08-23-15; will continue percocet 5/325 mg 1 or 2 tabs every 4 hours as needed   8. 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

## 2015-08-18 DIAGNOSIS — K5904 Chronic idiopathic constipation: Secondary | ICD-10-CM | POA: Insufficient documentation

## 2015-08-18 DIAGNOSIS — E785 Hyperlipidemia, unspecified: Secondary | ICD-10-CM | POA: Insufficient documentation

## 2015-08-18 DIAGNOSIS — Z96651 Presence of right artificial knee joint: Secondary | ICD-10-CM | POA: Insufficient documentation

## 2015-08-19 ENCOUNTER — Encounter: Payer: Self-pay | Admitting: Internal Medicine

## 2015-08-19 ENCOUNTER — Other Ambulatory Visit: Payer: Self-pay | Admitting: *Deleted

## 2015-08-19 ENCOUNTER — Non-Acute Institutional Stay (SKILLED_NURSING_FACILITY): Payer: Medicare Other | Admitting: Internal Medicine

## 2015-08-19 DIAGNOSIS — R2681 Unsteadiness on feet: Secondary | ICD-10-CM | POA: Diagnosis not present

## 2015-08-19 DIAGNOSIS — I10 Essential (primary) hypertension: Secondary | ICD-10-CM | POA: Diagnosis not present

## 2015-08-19 DIAGNOSIS — M1711 Unilateral primary osteoarthritis, right knee: Secondary | ICD-10-CM | POA: Diagnosis not present

## 2015-08-19 DIAGNOSIS — K219 Gastro-esophageal reflux disease without esophagitis: Secondary | ICD-10-CM

## 2015-08-19 DIAGNOSIS — E785 Hyperlipidemia, unspecified: Secondary | ICD-10-CM

## 2015-08-19 DIAGNOSIS — K5901 Slow transit constipation: Secondary | ICD-10-CM | POA: Diagnosis not present

## 2015-08-19 DIAGNOSIS — F32A Depression, unspecified: Secondary | ICD-10-CM

## 2015-08-19 DIAGNOSIS — D62 Acute posthemorrhagic anemia: Secondary | ICD-10-CM | POA: Diagnosis not present

## 2015-08-19 DIAGNOSIS — E039 Hypothyroidism, unspecified: Secondary | ICD-10-CM | POA: Diagnosis not present

## 2015-08-19 DIAGNOSIS — F329 Major depressive disorder, single episode, unspecified: Secondary | ICD-10-CM | POA: Diagnosis not present

## 2015-08-19 MED ORDER — OXYCODONE-ACETAMINOPHEN 5-325 MG PO TABS: ORAL_TABLET | ORAL | Status: DC

## 2015-08-19 NOTE — Progress Notes (Signed)
Patient ID: [name] [name], male   DOB: [birth year], 70 y.o.   MRN: [id number]    LOCATION: [facility]  PCP: [name], MD   Code Status: Full Code  Goals of care: Advanced Directive information Advanced Directives 07/29/2015  Does patient have an advance directive? No  Would patient like information on creating an advanced directive? No - patient declined information       Extended Emergency Contact Information Primary Emergency Contact: [place], [place] Address: [address] [place], [place] [address] [place] Phone: [phone] Mobile Phone: [phone] Relation: Spouse Secondary Emergency Contact: Conchita [place] Phone: [phone] Mobile Phone: [phone] Relation: Daughter   Allergies  Allergen Reactions  . Penicillins Hives and Other (See Comments)    "I break out in welts"  . Codeine Nausea And Vomiting  . Sulfonamide Derivatives Nausea And Vomiting    Chief Complaint  Patient presents with  . New Admit To SNF    New Admission     HPI:  Patient is a 70 y.o. male seen today for short term rehabilitation post hospital admission from 08/08/15-08/11/15 with right knee OA. He underwent right total knee arthroplasty. He is seen in his room today with his wife present. The pain medication has been helpful and he has been working with therapy. He had a bowel movement today after 1 week.   Review of Systems:  Constitutional: Negative for fever, chills and diaphoresis.  HENT: Negative for headache, congestion Eyes: Negative for blurred vision, double vision Respiratory: Negative for cough, shortness of breath and wheezing.   Cardiovascular: Negative for chest pain, palpitations, leg swelling.  Gastrointestinal: Negative for heartburn, nausea, vomiting, abdominal pain. Has chronic constipation Genitourinary: Negative for dysuria Musculoskeletal: Negative for fall Skin: Negative for itching,  rash.  Neurological: Negative for dizziness Psychiatric/Behavioral: Negative for depression   Past Medical History  Diagnosis Date  . Observed sleep apnea     can't wear cpap  . HTN (hypertension)   . GERD (gastroesophageal reflux disease)   . Dyspnea     -PFTs compeltely normal 03/20/08 including DLc0  . Sleep apnea     Most recent sleep study 2010; records at [facility] office  . CAD (coronary artery disease)     cath 3/10: mLAD 30%, pRCA 30%, EF 55-60% Dr. Harrington Challenger cardiologist  most recent  stress stress done ~ 2 years ago with Dr. Harrington Challenger  . Hypothyroidism   . Low testosterone   . Arthritis   . Depression   . Polycythemia   . Pulmonary nodule 06/13/2012  . Rosacea   . PONV (postoperative nausea and vomiting)   . Complication of anesthesia     "he usually gets an ileus after back OR"   . Environmental allergies     Dust, Smoke   Past Surgical History  Procedure Laterality Date  . Cataract extraction, bilateral  2010  . Meniscus repair  08/2008    left  . Tonsillectomy  09/21/11  . Uvulopalatopharyngoplasty, tonsillectomy and septoplasty  09/21/11    Deviated Septum  . Posterior fusion lumbar spine  2005; 01/2007; 04/2009    L5; L3-4; L2-3  . Lumbar disc surgery  06/2006    L3  . Back surgery  2005; 06/2006; 01/2007; 04/2009  . Prostate surgery  2000  . Transurethral resection of prostate  2006    followed by "surgery to get rid of clots"  .  Shoulder open rotator cuff repair Left 1999    left  . Cervical fusion  04/2002    ?C3-4  . 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 Cardiac catheterization  08/2008  . Esophagogastroduodenoscopy  multiple    last 07/2012 GERD esophagitis, 48 Fr dilation  . Colonoscopy    . Knee arthroscopy with medial menisectomy Right 05/14/2015    Procedure: RIGHT KNEE ARTHROSCOPY CHONDROPLASTY, PARTIAL  MEDIAL MENISECTOMY;  Surgeon: [name], MD;  Location: [place];  Service: Orthopedics;  Laterality: Right;  . Total  knee arthroplasty Right 08/08/2015    Procedure: RIGHT TOTAL KNEE ARTHROPLASTY;  Surgeon: [name], MD;  Location: [place];  Service: Orthopedics;  Laterality: Right;   Social History:   reports that he quit smoking about 41 years ago. His smoking use included Cigarettes. He has a 27 pack-year smoking history. He has never used smokeless tobacco. He reports that he does not drink alcohol or use illicit drugs.  Family History  Problem Relation Age of Onset  . Anesthesia problems Neg Hx   . Breast cancer Mother   . Colon polyps Mother   . Diabetes Mother   . Breast cancer Sister   . Diabetes Sister     borderline  . Heart Problems Sister   . Heart attack Father   . Alzheimer's disease Father   . Lung cancer Paternal Uncle     x4  . Throat cancer Maternal Uncle   . Liver cancer Maternal Uncle     Medications:   Medication List       This list is accurate as of: 08/19/15  3:35 PM.  Always use your most recent med list.               citalopram 20 MG tablet  Commonly known as:  CELEXA  Take 20 mg by mouth daily.     enoxaparin 30 MG/0.3ML injection  Commonly known as:  LOVENOX  Inject 0.3 mLs (30 mg total) into the skin every 12 (twelve) hours.     hydrochlorothiazide 25 MG tablet  Commonly known as:  HYDRODIURIL  Take 25 mg by mouth daily.     levothyroxine 150 MCG tablet  Commonly known as:  SYNTHROID, LEVOTHROID  Take 150 mcg by mouth daily before breakfast.     losartan 50 MG tablet  Commonly known as:  COZAAR  Take 50 mg by mouth daily.     MIRALAX packet  Generic drug:  polyethylene glycol  Take 17 g by mouth daily.     omeprazole 40 MG capsule  Commonly known as:  PRILOSEC  Take 1 capsule (40 mg total) by mouth daily before breakfast. 30 minutes before     oxyCODONE-acetaminophen 5-325 MG tablet  Commonly known as:  ROXICET  Take one to two tablets by mouth every 4 hours as needed for severe pain. Do not exceed 4gm of Tylenol in 24 hours      sennosides-docusate sodium 8.6-50 MG tablet  Commonly known as:  SENOKOT-S  Take 1 tablet by mouth 2 (two) times daily.     simvastatin 20 MG tablet  Commonly known as:  ZOCOR  Take 1 tablet (20 mg total) by mouth daily at 6 PM.     triamcinolone cream 0.1 %  Commonly known as:  KENALOG  Apply 1 application topically 2 (two) times daily. Apply to back and buttocks for rash.     Trospium Chloride 60 MG Cp24  Take 60 mg by mouth daily.        Immunizations: Immunization History  Administered Date(s) Administered  . Influenza Split 03/28/2013  . Influenza-Unspecified 03/29/2011  . PPD Test 08/14/2015     Physical Exam: Filed Vitals:   08/19/15 1529  BP: 144/91  Pulse: 70  Temp: 98.1 F (36.7 C)  TempSrc: Oral  Resp: 18  Height: 5\' 10"  (1.778 m)  Weight: 282 lb (127.914 kg)  SpO2: 96%   Body mass index is 40.46 kg/(m^2).  General- elderly obese male in no acute distress Head- normocephalic, atraumatic Nose- no maxillary sinus tenderness, no nasal discharge Throat- moist mucus membrane Eyes- PERRLA, EOMI, no pallor, no icterus Neck- no cervical lymphadenopathy Cardiovascular- normal s1,s2, no murmurs, trace leg edema Respiratory- bilateral clear to auscultation, no wheeze, no rhonchi, no crackles, no use of accessory muscles Abdomen- bowel sounds present, soft, non tender Musculoskeletal- able to move all 4 extremities, limited right knee range of motion Neurological- alert and oriented to person, place and time Skin- warm and dry, right knee surgical incision with staples in place and healing well Psychiatry- normal mood and affect    Labs reviewed: Basic Metabolic Panel:  Recent Labs  05/12/15 1425 07/29/15 1245 08/08/15 1306 08/09/15 0632 08/14/15  NA 137 141  --  135 137  K 4.0 4.4  --  3.8 4.5  CL 103 102  --  98*  --   CO2 27 29  --  27  --   GLUCOSE 92 80  --  104*  --   BUN 17 19  --  14 13  CREATININE 0.86 0.91 0.91 0.84 0.8  CALCIUM 9.3  9.6  --  8.7*  --    Liver Function Tests:  Recent Labs  07/29/15 1245 08/14/15  AST 25 29  ALT 27 24  ALKPHOS 64 56  BILITOT 0.6  --   PROT 7.4  --   ALBUMIN 3.7  --    No results for input(s): LIPASE, AMYLASE in the last 8760 hours. No results for input(s): AMMONIA in the last 8760 hours. CBC:  Recent Labs  07/29/15 1245  08/09/15 0632 08/10/15 0315 08/11/15 0408 08/14/15  WBC 8.8  < > 9.8 10.4 8.9 7.3  NEUTROABS 5.8  --   --   --   --   --   HGB 15.2  < > 12.3* 12.1* 12.3* 12.2*  HCT 45.5  < > 37.8* 36.6* 38.7* 38*  MCV 94.4  < > 95.5 93.4 95.6  --   PLT 215  < > 207 205 217 305  < > = values in this interval not displayed. Cardiac Enzymes: No results for input(s): CKTOTAL, CKMB, CKMBINDEX, TROPONINI in the last 8760 hours. BNP: Invalid input(s): POCBNP CBG: No results for input(s): GLUCAP in the last 8760 hours.  Radiological Exams: Dg Chest 2 View  07/29/2015  CLINICAL DATA:  Smoker quit in the 1980s. Smoke 1.5 packs per day for 20 years. Preop total knee arthroplasty. EXAM: CHEST  2 VIEW COMPARISON:  CT chest 12/11/2013 FINDINGS: There is a 5 mm right upper lobe pulmonary nodule similar in appearance when correlated with prior CT of the chest dated 12/11/2013. There is no focal parenchymal opacity. There is no pleural effusion or pneumothorax. The heart and mediastinal contours are unremarkable. The osseous structures are unremarkable. IMPRESSION: No active cardiopulmonary disease. Electronically Signed   By: Kathreen Devoid   On: 07/29/2015 13:18    Assessment/Plan  Unsteady gait Will  have patient work with PT/OT as tolerated to regain strength and restore function.  Fall precautions are in place.  Right knee OA S/p right knee arthroplasty. Will have him work with physical therapy and occupational therapy team to help with gait training and muscle strengthening exercises.fall precautions. Skin care. Encourage to be out of bed. Continue percocet 5-325 mg 1-2 tab  q4h prn pain. Has f.u with orthopedics. Continue lovenox for DVT prophylaxis.add ted hose to help with leg edema  Anemia Likely post op, hb low but stable, monitor cbc  Hypertension continue hctz 25 mg daily and cozaar 50 mg daily, monitor bp bid x 1 week  Constipation Take miralax daily and add miralax daily in the am if needed for constipation. Continue senokot s  gerd Stable, continue prilosec 40 mg daily  Hypothyroidism continue synthroid 150 mcg daily   Dyslipidemia continue zocor 20 mg daily   Depression continue celexa 20 mg daily    Goals of care: short term rehabilitation   Labs/tests ordered: none  Family/ staff Communication: reviewed care plan with patient and nursing supervisor    Blanchie Serve, MD Internal Medicine [place], [place] [address] Cell Phone (Monday-Friday 8 am - 5 pm): [phone] On Call: [phone] and follow prompts after 5 pm and on weekends Office Phone: [phone] Office Fax: [phone]

## 2015-08-19 NOTE — Telephone Encounter (Signed)
[facility] 

## 2015-08-26 ENCOUNTER — Non-Acute Institutional Stay (SKILLED_NURSING_FACILITY): Payer: Medicare Other | Admitting: Family

## 2015-08-26 DIAGNOSIS — Z96651 Presence of right artificial knee joint: Secondary | ICD-10-CM

## 2015-08-26 DIAGNOSIS — R269 Unspecified abnormalities of gait and mobility: Secondary | ICD-10-CM

## 2015-08-26 DIAGNOSIS — E782 Mixed hyperlipidemia: Secondary | ICD-10-CM | POA: Diagnosis not present

## 2015-08-26 DIAGNOSIS — K219 Gastro-esophageal reflux disease without esophagitis: Secondary | ICD-10-CM

## 2015-08-26 DIAGNOSIS — G4733 Obstructive sleep apnea (adult) (pediatric): Secondary | ICD-10-CM | POA: Diagnosis not present

## 2015-08-26 DIAGNOSIS — I10 Essential (primary) hypertension: Secondary | ICD-10-CM

## 2015-08-26 DIAGNOSIS — K5901 Slow transit constipation: Secondary | ICD-10-CM

## 2015-08-26 DIAGNOSIS — M1711 Unilateral primary osteoarthritis, right knee: Secondary | ICD-10-CM

## 2015-08-26 NOTE — Progress Notes (Signed)
Patient ID: [name] [name], male   DOB: Feb 15, 1946, 70 y.o.   MRN: [id number]  Location:  [facility] of Service:  SNF (31)  Provider: Blanchie Serve, MD   PCP: [name], MD Patient Care Team: Darcus Austin, MD as PCP - General (Family Medicine) Wyatt Portela, MD (Hematology and Oncology) Irine Seal, MD (Urology) Fay Records, MD as Attending Physician (Cardiology) Kristeen Miss, MD (Neurosurgery)  Extended Emergency Contact Information Primary Emergency Contact: Judieth Keens Address: [address] [place], [place] [address] Johnnette Litter of [place] Phone: [phone] Mobile Phone: [phone] Relation: Spouse Secondary Emergency Contact: Conchita [place] of [place] Phone: [phone] Mobile Phone: [phone] Relation: Daughter  Code Status: Full Code  Goals of care:  Advanced Directive information Advanced Directives 07/29/2015  Does patient have an advance directive? No  Would patient like information on creating an advanced directive? No - patient declined information     Allergies  Allergen Reactions  . Penicillins Hives and Other (See Comments)    "I break out in welts"  . Codeine Nausea And Vomiting  . Sulfonamide Derivatives Nausea And Vomiting    Chief Complaint  Patient presents with  . Discharge Note    HPI:  70 y.o. male seen today at [facility] for discharge home. He is here for short term rehabilitation post hospital admission from 08/08/15-08/11/15 with right knee OA. He underwent right total knee arthroplasty. He has a medical history of HTN, GERD, OA, Sleep apnea, CAD, Hypothyroidism, Depression amongst others.  He is seen in his room today with his wife present. He states right knee pain well controlled with current regimen. Facility staff reports no acute concerns. He will be discharge home with PT for ROM, exercise, gait stability and muscle strengthening. He  does not require any DME states has own walker at home. Patient's wife also states doesn't need any medication refilled states just ordered all refills. Will only require hard script for his pain medication.     Past Medical History  Diagnosis Date  . Observed sleep apnea     can't wear cpap  . HTN (hypertension)   . GERD (gastroesophageal reflux disease)   . Dyspnea     -PFTs compeltely normal 03/20/08 including DLc0  . Sleep apnea     Most recent sleep study 2010; records at [facility] office  . CAD (coronary artery disease)     cath 3/10: mLAD 30%, pRCA 30%, EF 55-60% Dr. Harrington Challenger cardiologist  most recent  stress stress done ~ 2 years ago with Dr. Harrington Challenger  . Hypothyroidism   . Low testosterone   . Arthritis   . Depression   . Polycythemia   . Pulmonary nodule 06/13/2012  . Rosacea   . PONV (postoperative nausea and vomiting)   . Complication of anesthesia     "he usually gets an ileus after back OR"   . Environmental allergies     Dust, Smoke    Past Surgical History  Procedure Laterality Date  . Cataract extraction, bilateral  2010  . Meniscus repair  08/2008    left  . Tonsillectomy  09/21/11  . Uvulopalatopharyngoplasty, tonsillectomy and septoplasty  09/21/11    Deviated Septum  . Posterior fusion lumbar spine  2005; 01/2007; 04/2009    L5; L3-4; L2-3  . Lumbar disc surgery  06/2006    L3  .  Back surgery  2005; 06/2006; 01/2007; 04/2009  . Prostate surgery  2000  . Transurethral resection of prostate  2006    followed by "surgery to get rid of clots"  . Shoulder open rotator cuff repair Left 1999    left  . Cervical fusion  04/2002    ?C3-4  . 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 Cardiac catheterization  08/2008  . Esophagogastroduodenoscopy  multiple    last 07/2012 GERD esophagitis, 48 Fr dilation  . Colonoscopy    . Knee arthroscopy with medial menisectomy Right 05/14/2015    Procedure: RIGHT KNEE ARTHROSCOPY CHONDROPLASTY, PARTIAL  MEDIAL MENISECTOMY;   Surgeon: [name], MD;  Location: [facility];  Service: Orthopedics;  Laterality: Right;  . Total knee arthroplasty Right 08/08/2015    Procedure: RIGHT TOTAL KNEE ARTHROPLASTY;  Surgeon: [name], MD;  Location: [facility];  Service: Orthopedics;  Laterality: Right;      reports that he quit smoking about 41 years ago. His smoking use included Cigarettes. He has a 27 pack-year smoking history. He has never used smokeless tobacco. He reports that he does not drink alcohol or use illicit drugs. Social History   Social History  . Marital Status: Married    Spouse Name: N/A  . Number of Children: 3  . Years of Education: N/A   Occupational History  . retired    Social History Main Topics  . Smoking status: Former Smoker -- 1.50 packs/day for 18 years    Types: Cigarettes    Quit date: 06/28/1974  . Smokeless tobacco: Never Used  . Alcohol Use: No  . Drug Use: No  . Sexual Activity: No   Other Topics Concern  . Not on file   Social History Narrative   Area, 3 children.   Retired from the city of [place]   5 caffeinated beverages daily   Functional Status Survey:    Allergies  Allergen Reactions  . Penicillins Hives and Other (See Comments)    "I break out in welts"  . Codeine Nausea And Vomiting  . Sulfonamide Derivatives Nausea And Vomiting    Pertinent  Health Maintenance Due  Topic Date Due  . INFLUENZA VACCINE  08/14/2016 (Originally 01/27/2015)  . PNA vac Low Risk Adult (1 of 2 - PCV13) 08/14/2016 (Originally 01/11/2012)  . COLONOSCOPY  01/14/2016    Medications:   Medication List       This list is accurate as of: 08/26/15 12:17 PM.  Always use your most recent med list.               citalopram 20 MG tablet  Commonly known as:  CELEXA  Take 20 mg by mouth daily.     hydrochlorothiazide 25 MG tablet  Commonly known as:  HYDRODIURIL  Take 25 mg by mouth daily.     levothyroxine 150 MCG tablet  Commonly known as:  SYNTHROID,  LEVOTHROID  Take 150 mcg by mouth daily before breakfast.     losartan 50 MG tablet  Commonly known as:  COZAAR  Take 50 mg by mouth daily.     MIRALAX packet  Generic drug:  polyethylene glycol  Take 17 g by mouth daily.     omeprazole 40 MG capsule  Commonly known as:  PRILOSEC  Take 1 capsule (40 mg total) by mouth daily before breakfast. 30 minutes before     oxyCODONE-acetaminophen 5-325 MG tablet  Commonly known as:  ROXICET  Take  one to two tablets by mouth every 4 hours as needed for severe pain. Do not exceed 4gm of Tylenol in 24 hours     sennosides-docusate sodium 8.6-50 MG tablet  Commonly known as:  SENOKOT-S  Take 1 tablet by mouth 2 (two) times daily.     simvastatin 20 MG tablet  Commonly known as:  ZOCOR  Take 1 tablet (20 mg total) by mouth daily at 6 PM.     triamcinolone cream 0.1 %  Commonly known as:  KENALOG  Apply 1 application topically 2 (two) times daily. Reported on 08/26/2015     Trospium Chloride 60 MG Cp24  Take 60 mg by mouth daily.        Review of Systems  Constitutional: Positive for activity change. Negative for fever, chills, appetite change and fatigue.  HENT: Positive for sore throat. Negative for congestion, sinus pressure, sneezing and trouble swallowing.   Eyes: Negative.   Respiratory: Negative for cough, chest tightness, shortness of breath and wheezing.   Cardiovascular: Positive for leg swelling. Negative for chest pain and palpitations.  Gastrointestinal: Negative.   Endocrine: Negative.   Genitourinary: Negative.   Musculoskeletal: Positive for gait problem.       Right knee pain S/p knee replacement   Skin: Negative.   Allergic/Immunologic: Negative.   Neurological: Negative.   Hematological: Negative.   Psychiatric/Behavioral: Negative.     Filed Vitals:   08/26/15 1201  BP: 126/69  Pulse: 84  Temp: 98.5 F (36.9 C)  Resp: 18  Weight: 279 lb 12.8 oz (126.916 kg)  SpO2: 97%   Body mass index is 40.15  kg/(m^2). Physical Exam  Constitutional: He is oriented to person, place, and time. He appears well-developed and well-nourished. No distress.  HENT:  Head: Normocephalic.  Right Ear: External ear normal.  Left Ear: External ear normal.  Mouth/Throat: Oropharynx is clear and moist.  Eyes: Conjunctivae and EOM are normal. Pupils are equal, round, and reactive to light. Right eye exhibits no discharge. Left eye exhibits no discharge. No scleral icterus.  Neck: Normal range of motion. No JVD present. No thyromegaly present.  Cardiovascular: Normal rate, regular rhythm, normal heart sounds and intact distal pulses.  Exam reveals no gallop and no friction rub.   No murmur heard. Pulmonary/Chest: Effort normal and breath sounds normal. No respiratory distress. He has no wheezes. He has no rales.  Abdominal: Soft. Bowel sounds are normal. He exhibits no distension and no mass. There is no tenderness. There is no rebound and no guarding.  Musculoskeletal: He exhibits no tenderness.  Normal ROM except right knee limited by pain. +1 edema to right leg.   Lymphadenopathy:    He has no cervical adenopathy.  Neurological: He is oriented to person, place, and time.  Skin: Skin is warm and dry. No rash noted. No erythema. No pallor.  Right knee steri-strips dry, clean and intact. Surrounding skin without any signs of infection.   Psychiatric: He has a normal mood and affect.    Labs reviewed: Basic Metabolic Panel:  Recent Labs  05/12/15 1425 07/29/15 1245 08/08/15 1306 08/09/15 0632 08/14/15  NA 137 141  --  135 137  K 4.0 4.4  --  3.8 4.5  CL 103 102  --  98*  --   CO2 27 29  --  27  --   GLUCOSE 92 80  --  104*  --   BUN 17 19  --  14 13  CREATININE 0.86 0.91 0.91  0.84 0.8  CALCIUM 9.3 9.6  --  8.7*  --    Liver Function Tests:  Recent Labs  07/29/15 1245 08/14/15  AST 25 29  ALT 27 24  ALKPHOS 64 56  BILITOT 0.6  --   PROT 7.4  --   ALBUMIN 3.7  --    No results for  input(s): LIPASE, AMYLASE in the last 8760 hours. No results for input(s): AMMONIA in the last 8760 hours. CBC:  Recent Labs  07/29/15 1245  08/09/15 0632 08/10/15 0315 08/11/15 0408 08/14/15  WBC 8.8  < > 9.8 10.4 8.9 7.3  NEUTROABS 5.8  --   --   --   --   --   HGB 15.2  < > 12.3* 12.1* 12.3* 12.2*  HCT 45.5  < > 37.8* 36.6* 38.7* 38*  MCV 94.4  < > 95.5 93.4 95.6  --   PLT 215  < > 207 205 217 305  < > = values in this interval not displayed. Cardiac Enzymes: No results for input(s): CKTOTAL, CKMB, CKMBINDEX, TROPONINI in the last 8760 hours. BNP: Invalid input(s): POCBNP CBG: No results for input(s): GLUCAP in the last 8760 hours.  Procedures and Imaging Studies During Stay: Dg Chest 2 View  07/29/2015  CLINICAL DATA:  Smoker quit in the 1980s. Smoke 1.5 packs per day for 20 years. Preop total knee arthroplasty. EXAM: CHEST  2 VIEW COMPARISON:  CT chest 12/11/2013 FINDINGS: There is a 5 mm right upper lobe pulmonary nodule similar in appearance when correlated with prior CT of the chest dated 12/11/2013. There is no focal parenchymal opacity. There is no pleural effusion or pneumothorax. The heart and mediastinal contours are unremarkable. The osseous structures are unremarkable. IMPRESSION: No active cardiopulmonary disease. Electronically Signed   By: Kathreen Devoid   On: 07/29/2015 13:18    Assessment/Plan:   1. Essential hypertension B/p stable. Continue on Hydrochlorothiazide and Losartan. Follow- up with PCP to monitor BMP  2. Obstructive sleep apnea Continue to monitor.   3. Constipation, slow transit Continue with Miralax and Senekot-S   4. Gastroesophageal reflux disease without esophagitis Asymptomatic. Continue on Omeprazole 40 mg daily.   5. Primary localized osteoarthritis of right knee S/p post Hospital admission 08/08/15-08/11/15  For  right total knee arthroplasty. Current pain meds effective.   6. Abnormality of gait Has worked well with Physical  Therapist. Will D/C home with PT for ROM, Exercise, Gait stability and Muscle strengthening.   7. Status post right knee replacement S/p post Hospital admission 08/08/15-08/11/15 For right total knee arthroplasty. Steri-strips intact without signs of infections. Current pain meds effective.Follow up with Orthopedic as directed.   8. Mixed hyperlipidemia Continue on Simvastatin 20 mg Tablet daily. Follow up with PCP to monitor Lipid panel.     Patient is being discharged with the following home health services:   PT for ROM, exercise, gait stability and muscle strengthening.    Patient is being discharged with the following durable medical equipment:    He does not require any DME states has own walker at home.     No medication Scripts written except Oxycodone/Acetaminophen 5/325mg  Tablet. Patient's wife states doesn't need any medication refilled states just ordered all refills. Will only require hard script for his pain medication.  Patient has been advised to f/u with their PCP in 1-2 weeks to bring them up to date on their rehab stay.  Social services at facility was responsible for arranging this appointment.  Pt was  provided with a 30 day supply of prescriptions for medications and refills must be obtained from their PCP.  For controlled substances, a more limited supply may be provided adequate until PCP appointment only.  Future labs/tests needed: CBC, BMP with PCP

## 2015-08-29 DIAGNOSIS — F329 Major depressive disorder, single episode, unspecified: Secondary | ICD-10-CM | POA: Diagnosis not present

## 2015-08-29 DIAGNOSIS — I10 Essential (primary) hypertension: Secondary | ICD-10-CM | POA: Diagnosis not present

## 2015-08-29 DIAGNOSIS — K5901 Slow transit constipation: Secondary | ICD-10-CM | POA: Diagnosis not present

## 2015-08-29 DIAGNOSIS — K219 Gastro-esophageal reflux disease without esophagitis: Secondary | ICD-10-CM | POA: Diagnosis not present

## 2015-08-29 DIAGNOSIS — Z471 Aftercare following joint replacement surgery: Secondary | ICD-10-CM | POA: Diagnosis not present

## 2015-08-29 DIAGNOSIS — Z96653 Presence of artificial knee joint, bilateral: Secondary | ICD-10-CM | POA: Diagnosis not present

## 2015-09-18 ENCOUNTER — Emergency Department (HOSPITAL_COMMUNITY): Payer: Medicare Other

## 2015-09-18 ENCOUNTER — Observation Stay (HOSPITAL_COMMUNITY): Payer: Medicare Other

## 2015-09-18 ENCOUNTER — Encounter (HOSPITAL_COMMUNITY): Payer: Self-pay | Admitting: Emergency Medicine

## 2015-09-18 ENCOUNTER — Telehealth: Payer: Self-pay | Admitting: Internal Medicine

## 2015-09-18 ENCOUNTER — Observation Stay (HOSPITAL_COMMUNITY)
Admission: EM | Admit: 2015-09-18 | Discharge: 2015-09-19 | Disposition: A | Discharge status: Home / Self Care | Payer: Medicare Other | Attending: Internal Medicine | Admitting: Internal Medicine

## 2015-09-18 ENCOUNTER — Emergency Department (HOSPITAL_BASED_OUTPATIENT_CLINIC_OR_DEPARTMENT_OTHER)
Admit: 2015-09-18 | Discharge: 2015-09-18 | Disposition: A | Discharge status: Home / Self Care | Payer: Medicare Other | Attending: Emergency Medicine | Admitting: Emergency Medicine

## 2015-09-18 DIAGNOSIS — Z96652 Presence of left artificial knee joint: Secondary | ICD-10-CM | POA: Insufficient documentation

## 2015-09-18 DIAGNOSIS — Z96651 Presence of right artificial knee joint: Secondary | ICD-10-CM

## 2015-09-18 DIAGNOSIS — Z87891 Personal history of nicotine dependence: Secondary | ICD-10-CM | POA: Diagnosis not present

## 2015-09-18 DIAGNOSIS — I2510 Atherosclerotic heart disease of native coronary artery without angina pectoris: Secondary | ICD-10-CM | POA: Diagnosis present

## 2015-09-18 DIAGNOSIS — I77810 Thoracic aortic ectasia: Secondary | ICD-10-CM | POA: Diagnosis not present

## 2015-09-18 DIAGNOSIS — I517 Cardiomegaly: Secondary | ICD-10-CM | POA: Diagnosis present

## 2015-09-18 DIAGNOSIS — R1314 Dysphagia, pharyngoesophageal phase: Secondary | ICD-10-CM | POA: Diagnosis not present

## 2015-09-18 DIAGNOSIS — K317 Polyp of stomach and duodenum: Secondary | ICD-10-CM | POA: Diagnosis not present

## 2015-09-18 DIAGNOSIS — G4733 Obstructive sleep apnea (adult) (pediatric): Secondary | ICD-10-CM | POA: Diagnosis present

## 2015-09-18 DIAGNOSIS — E782 Mixed hyperlipidemia: Secondary | ICD-10-CM | POA: Diagnosis not present

## 2015-09-18 DIAGNOSIS — K222 Esophageal obstruction: Secondary | ICD-10-CM | POA: Diagnosis not present

## 2015-09-18 DIAGNOSIS — Z7982 Long term (current) use of aspirin: Secondary | ICD-10-CM | POA: Diagnosis not present

## 2015-09-18 DIAGNOSIS — R1012 Left upper quadrant pain: Secondary | ICD-10-CM | POA: Diagnosis not present

## 2015-09-18 DIAGNOSIS — E039 Hypothyroidism, unspecified: Secondary | ICD-10-CM | POA: Diagnosis not present

## 2015-09-18 DIAGNOSIS — K5904 Chronic idiopathic constipation: Secondary | ICD-10-CM | POA: Diagnosis present

## 2015-09-18 DIAGNOSIS — K2210 Ulcer of esophagus without bleeding: Secondary | ICD-10-CM | POA: Diagnosis present

## 2015-09-18 DIAGNOSIS — K219 Gastro-esophageal reflux disease without esophagitis: Secondary | ICD-10-CM | POA: Diagnosis present

## 2015-09-18 DIAGNOSIS — M79609 Pain in unspecified limb: Secondary | ICD-10-CM

## 2015-09-18 DIAGNOSIS — R1310 Dysphagia, unspecified: Secondary | ICD-10-CM | POA: Diagnosis not present

## 2015-09-18 DIAGNOSIS — K5900 Constipation, unspecified: Secondary | ICD-10-CM | POA: Diagnosis not present

## 2015-09-18 DIAGNOSIS — K208 Other esophagitis: Secondary | ICD-10-CM | POA: Diagnosis not present

## 2015-09-18 DIAGNOSIS — Z880 Allergy status to penicillin: Secondary | ICD-10-CM | POA: Insufficient documentation

## 2015-09-18 DIAGNOSIS — R0789 Other chest pain: Principal | ICD-10-CM | POA: Diagnosis present

## 2015-09-18 DIAGNOSIS — E669 Obesity, unspecified: Secondary | ICD-10-CM | POA: Insufficient documentation

## 2015-09-18 DIAGNOSIS — I10 Essential (primary) hypertension: Secondary | ICD-10-CM | POA: Diagnosis present

## 2015-09-18 DIAGNOSIS — Z6838 Body mass index (BMI) 38.0-38.9, adult: Secondary | ICD-10-CM | POA: Insufficient documentation

## 2015-09-18 DIAGNOSIS — R7989 Other specified abnormal findings of blood chemistry: Secondary | ICD-10-CM | POA: Diagnosis present

## 2015-09-18 LAB — BASIC METABOLIC PANEL
ANION GAP: 10 (ref 5–15)
BUN: 19 mg/dL (ref 6–20)
CALCIUM: 9.3 mg/dL (ref 8.9–10.3)
CHLORIDE: 103 mmol/L (ref 101–111)
CO2: 23 mmol/L (ref 22–32)
Creatinine, Ser: 0.82 mg/dL (ref 0.61–1.24)
GFR calc non Af Amer: >60 mL/min (ref >60)
Glucose, Bld: 146 mg/dL — ABNORMAL HIGH (ref 65–99)
Potassium: 4 mmol/L (ref 3.5–5.1)
SODIUM: 136 mmol/L (ref 135–145)

## 2015-09-18 LAB — CBC WITH DIFFERENTIAL/PLATELET
BASOS ABS: 0 10*3/uL (ref 0.0–0.1)
BASOS PCT: 0 %
Eosinophils Absolute: 0.1 10*3/uL (ref 0.0–0.7)
Eosinophils Relative: 1 %
HEMATOCRIT: 42.3 % (ref 39.0–52.0)
HEMOGLOBIN: 14 g/dL (ref 13.0–17.0)
Lymphocytes Relative: 12 %
Lymphs Abs: 1.2 10*3/uL (ref 0.7–4.0)
MCH: 31.3 pg (ref 26.0–34.0)
MCHC: 33.1 g/dL (ref 30.0–36.0)
MCV: 94.4 fL (ref 78.0–100.0)
Monocytes Absolute: 1 10*3/uL (ref 0.1–1.0)
Monocytes Relative: 10 %
NEUTROS ABS: 8.1 10*3/uL — AB (ref 1.7–7.7)
NEUTROS PCT: 77 %
Platelets: 207 10*3/uL (ref 150–400)
RBC: 4.48 MIL/uL (ref 4.22–5.81)
RDW: 13.8 % (ref 11.5–15.5)
WBC: 10.4 10*3/uL (ref 4.0–10.5)

## 2015-09-18 LAB — HEPATIC FUNCTION PANEL
ALT: 14 U/L — AB (ref 17–63)
AST: 19 U/L (ref 15–41)
Albumin: 3.3 g/dL — ABNORMAL LOW (ref 3.5–5.0)
Alkaline Phosphatase: 59 U/L (ref 38–126)
BILIRUBIN INDIRECT: 0.5 mg/dL (ref 0.3–0.9)
Bilirubin, Direct: 0.2 mg/dL (ref 0.1–0.5)
TOTAL PROTEIN: 6.6 g/dL (ref 6.5–8.1)
Total Bilirubin: 0.7 mg/dL (ref 0.3–1.2)

## 2015-09-18 LAB — TROPONIN I: Troponin I: <0.03 ng/mL (ref <0.031)

## 2015-09-18 LAB — D-DIMER, QUANTITATIVE (NOT AT ARMC): D DIMER QUANT: 2.51 ug{FEU}/mL — AB (ref 0.00–0.50)

## 2015-09-18 LAB — TSH: TSH: 1.764 u[IU]/mL (ref 0.350–4.500)

## 2015-09-18 LAB — LIPASE, BLOOD: LIPASE: 33 U/L (ref 11–51)

## 2015-09-18 MED ORDER — MAGNESIUM CITRATE PO SOLN
1.0000 | Freq: Once | ORAL | Status: AC
  Administered 2015-09-18: 1 via ORAL
  Filled 2015-09-18: qty 296

## 2015-09-18 MED ORDER — ACETAMINOPHEN 325 MG PO TABS: 650.0000 mg | ORAL_TABLET | ORAL | Status: DC | PRN

## 2015-09-18 MED ORDER — NITROGLYCERIN 0.4 MG SL SUBL
0.4000 mg | SUBLINGUAL_TABLET | SUBLINGUAL | Status: DC | PRN
  Administered 2015-09-18: 0.4 mg via SUBLINGUAL
  Filled 2015-09-18: qty 1

## 2015-09-18 MED ORDER — CITALOPRAM HYDROBROMIDE 20 MG PO TABS
10.0000 mg | ORAL_TABLET | ORAL | Status: DC
  Administered 2015-09-18: 10 mg via ORAL
  Filled 2015-09-18: qty 1

## 2015-09-18 MED ORDER — SODIUM CHLORIDE 0.9 % IV BOLUS (SEPSIS)
1000.0000 mL | Freq: Once | INTRAVENOUS | Status: AC
Start: 2015-09-18 — End: 2015-09-18
  Administered 2015-09-18: 1000 mL via INTRAVENOUS

## 2015-09-18 MED ORDER — SENNOSIDES-DOCUSATE SODIUM 8.6-50 MG PO TABS
2.0000 | ORAL_TABLET | Freq: Two times a day (BID) | ORAL | Status: DC
  Filled 2015-09-18 (×3): qty 2

## 2015-09-18 MED ORDER — LEVOTHYROXINE SODIUM 75 MCG PO TABS
150.0000 ug | ORAL_TABLET | Freq: Every day | ORAL | Status: DC
  Administered 2015-09-19: 150 ug via ORAL
  Filled 2015-09-18: qty 2

## 2015-09-18 MED ORDER — LOSARTAN POTASSIUM 50 MG PO TABS
50.0000 mg | ORAL_TABLET | Freq: Every day | ORAL | Status: DC
  Administered 2015-09-18 – 2015-09-19 (×2): 50 mg via ORAL
  Filled 2015-09-18 (×3): qty 1

## 2015-09-18 MED ORDER — ONDANSETRON HCL 4 MG/2ML IJ SOLN: 4.0000 mg | Freq: Four times a day (QID) | INTRAMUSCULAR | Status: DC | PRN

## 2015-09-18 MED ORDER — ENOXAPARIN SODIUM 40 MG/0.4ML ~~LOC~~ SOLN: 40.0000 mg | SUBCUTANEOUS | Status: DC

## 2015-09-18 MED ORDER — BISACODYL 10 MG RE SUPP
10.0000 mg | Freq: Once | RECTAL | Status: DC
  Filled 2015-09-18: qty 1

## 2015-09-18 MED ORDER — TROSPIUM CHLORIDE ER 60 MG PO CP24: 60.0000 mg | ORAL_CAPSULE | Freq: Every day | ORAL | Status: DC

## 2015-09-18 MED ORDER — POLYETHYLENE GLYCOL 3350 17 G PO PACK
17.0000 g | PACK | Freq: Every day | ORAL | Status: DC
  Filled 2015-09-18 (×2): qty 1

## 2015-09-18 MED ORDER — DARIFENACIN HYDROBROMIDE ER 7.5 MG PO TB24
7.5000 mg | ORAL_TABLET | Freq: Every day | ORAL | Status: DC
  Filled 2015-09-18 (×2): qty 1

## 2015-09-18 MED ORDER — SODIUM CHLORIDE 0.9 % IV SOLN
INTRAVENOUS | Status: DC
  Administered 2015-09-18: 14:00:00 via INTRAVENOUS

## 2015-09-18 MED ORDER — ASPIRIN EC 81 MG PO TBEC
81.0000 mg | DELAYED_RELEASE_TABLET | Freq: Two times a day (BID) | ORAL | Status: DC
  Filled 2015-09-18: qty 1

## 2015-09-18 MED ORDER — SIMVASTATIN 20 MG PO TABS
20.0000 mg | ORAL_TABLET | Freq: Every day | ORAL | Status: DC
  Filled 2015-09-18: qty 1

## 2015-09-18 MED ORDER — MAGNESIUM HYDROXIDE 400 MG/5ML PO SUSP
15.0000 mL | Freq: Every day | ORAL | Status: DC | PRN
  Filled 2015-09-18: qty 30

## 2015-09-18 MED ORDER — OXYCODONE-ACETAMINOPHEN 5-325 MG PO TABS
2.0000 | ORAL_TABLET | Freq: Once | ORAL | Status: AC
  Administered 2015-09-18: 2 via ORAL
  Filled 2015-09-18: qty 2

## 2015-09-18 MED ORDER — SODIUM CHLORIDE 0.9 % IV SOLN
INTRAVENOUS | Status: DC
  Administered 2015-09-18: via INTRAVENOUS

## 2015-09-18 MED ORDER — IOHEXOL 350 MG/ML SOLN
100.0000 mL | Freq: Once | INTRAVENOUS | Status: AC | PRN
  Administered 2015-09-18: 100 mL via INTRAVENOUS

## 2015-09-18 MED ORDER — OXYCODONE-ACETAMINOPHEN 5-325 MG PO TABS
2.0000 | ORAL_TABLET | ORAL | Status: DC | PRN
  Administered 2015-09-18 – 2015-09-19 (×2): 2 via ORAL
  Filled 2015-09-18 (×2): qty 2

## 2015-09-18 MED ORDER — PANTOPRAZOLE SODIUM 40 MG PO TBEC: 40.0000 mg | DELAYED_RELEASE_TABLET | Freq: Every day | ORAL | Status: DC

## 2015-09-18 MED ORDER — GI COCKTAIL ~~LOC~~
30.0000 mL | Freq: Once | ORAL | Status: AC
  Administered 2015-09-18: 30 mL via ORAL
  Filled 2015-09-18: qty 30

## 2015-09-18 MED ORDER — PANTOPRAZOLE SODIUM 40 MG PO TBEC
40.0000 mg | DELAYED_RELEASE_TABLET | Freq: Two times a day (BID) | ORAL | Status: DC
  Administered 2015-09-19: 40 mg via ORAL
  Filled 2015-09-18 (×3): qty 1

## 2015-09-18 MED ORDER — DOCUSATE SODIUM 100 MG PO CAPS
100.0000 mg | ORAL_CAPSULE | Freq: Two times a day (BID) | ORAL | Status: DC
  Filled 2015-09-18 (×2): qty 1

## 2015-09-18 NOTE — Progress Notes (Signed)
VASCULAR LAB PRELIMINARY  PRELIMINARY  PRELIMINARY  PRELIMINARY  Right lower extremity venous dupled completed.    Preliminary report:  There is no DVT or SVT noted in the right lower extremity.   Kanden Carey, RVT 09/18/2015, 9:34 AM

## 2015-09-18 NOTE — ED Notes (Signed)
Pt states he had spicey beans for lunch yesterday and pain started after that pain is sharp in nature and hurts when he takes a deep breath and spot that dr pushed on that was sore left side , nauseated yesterday no vomiting has been constipated

## 2015-09-18 NOTE — ED Notes (Addendum)
Pt arrives via [facility] for c/o left axillary/left chest pain that began yesterday. Pt reports pain is sharp, also report some sob due to the pain.EMS reports pt had 6 baby ASA pta. Pt a/ox4, nad.

## 2015-09-18 NOTE — ED Provider Notes (Signed)
CSN: [id number]     Arrival date & time 09/18/15  0830 History   First MD Initiated Contact with Patient 09/18/15 [phone]     Chief Complaint  Patient presents with  . Chest Pain     (Consider location/radiation/quality/duration/timing/severity/associated sxs/prior Treatment) HPI  70 year old male presents with left-sided chest pain. Began yesterday afternoon. He went to take a nap and when he woke up the pain was still present and worse. Has been constant since then. Pain is a sharp, stabbing pain. It is under his left breast. It worsens with inspiration. Sometimes worse when getting up and walking. Laying flat is also worse. No shortness of breath. No fevers. Was nauseated yesterday but none today. Was given 4 baby aspirin. Not given nitroglycerin because EMS contacted an IV. Patient states that he had a knee operation one month ago, since then that same right knee/leg has been swollen. He thinks it's slightly improved over the last couple days. When breathing in his pain is a 7, it is about a 4.  Past Medical History  Diagnosis Date  . Observed sleep apnea     can't wear cpap  . HTN (hypertension)   . GERD (gastroesophageal reflux disease)   . Dyspnea     -PFTs compeltely normal 03/20/08 including DLc0  . Sleep apnea     Most recent sleep study 2010; records at [facility] office  . CAD (coronary artery disease)     cath 3/10: mLAD 30%, pRCA 30%, EF 55-60% Dr. Harrington Challenger cardiologist  most recent  stress stress done ~ 2 years ago with Dr. Harrington Challenger  . Hypothyroidism   . Low testosterone   . Arthritis   . Depression   . Polycythemia   . Pulmonary nodule 06/13/2012  . Rosacea   . PONV (postoperative nausea and vomiting)   . Complication of anesthesia     "he usually gets an ileus after back OR"   . Environmental allergies     Dust, Smoke   Past Surgical History  Procedure Laterality Date  . Cataract extraction, bilateral  2010  . Meniscus repair  08/2008    left  . Tonsillectomy  09/21/11   . Uvulopalatopharyngoplasty, tonsillectomy and septoplasty  09/21/11    Deviated Septum  . Posterior fusion lumbar spine  2005; 01/2007; 04/2009    L5; L3-4; L2-3  . Lumbar disc surgery  06/2006    L3  . Back surgery  2005; 06/2006; 01/2007; 04/2009  . Prostate surgery  2000  . Transurethral resection of prostate  2006    followed by "surgery to get rid of clots"  . Shoulder open rotator cuff repair Left 1999    left  . Cervical fusion  04/2002    ?C3-4  . 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 Cardiac catheterization  08/2008  . Esophagogastroduodenoscopy  multiple    last 07/2012 GERD esophagitis, 48 Fr dilation  . Colonoscopy    . Knee arthroscopy with medial menisectomy Right 05/14/2015    Procedure: RIGHT KNEE ARTHROSCOPY CHONDROPLASTY, PARTIAL  MEDIAL MENISECTOMY;  Surgeon: [name], MD;  Location: [facility];  Service: Orthopedics;  Laterality: Right;  . Total knee arthroplasty Right 08/08/2015    Procedure: RIGHT TOTAL KNEE ARTHROPLASTY;  Surgeon: [name], MD;  Location: [facility];  Service: Orthopedics;  Laterality: Right;   Family History  Problem Relation Age of Onset  . Anesthesia problems Neg Hx   . Breast cancer Mother   . Colon  polyps Mother   . Diabetes Mother   . Breast cancer Sister   . Diabetes Sister     borderline  . Heart Problems Sister   . Heart attack Father   . Alzheimer's disease Father   . Lung cancer Paternal Uncle     x4  . Throat cancer Maternal Uncle   . Liver cancer Maternal Uncle    Social History  Substance Use Topics  . Smoking status: Former Smoker -- 1.50 packs/day for 18 years    Types: Cigarettes    Quit date: 06/28/1974  . Smokeless tobacco: Never Used  . Alcohol Use: No    Review of Systems  Respiratory: Negative for shortness of breath.   Cardiovascular: Positive for chest pain and leg swelling.  Gastrointestinal: Positive for nausea. Negative for vomiting and abdominal pain.  All other systems  reviewed and are negative.     Allergies  Penicillins; Codeine; and Sulfonamide derivatives  Home Medications   Prior to Admission medications   Medication Sig Start Date End Date Taking? Authorizing Provider  citalopram (CELEXA) 20 MG tablet Take 20 mg by mouth daily.    Historical Provider, MD  hydrochlorothiazide (HYDRODIURIL) 25 MG tablet Take 25 mg by mouth daily.     Historical Provider, MD  levothyroxine (SYNTHROID, LEVOTHROID) 150 MCG tablet Take 150 mcg by mouth daily before breakfast.    Historical Provider, MD  losartan (COZAAR) 50 MG tablet Take 50 mg by mouth daily.    Historical Provider, MD  omeprazole (PRILOSEC) 40 MG capsule Take 1 capsule (40 mg total) by mouth daily before breakfast. 30 minutes before 08/18/12   Gatha Mayer, MD  oxyCODONE-acetaminophen (ROXICET) 5-325 MG tablet Take one to two tablets by mouth every 4 hours as needed for severe pain. Do not exceed 4gm of Tylenol in 24 hours 08/19/15   Lauree Chandler, NP  polyethylene glycol [facility]) packet Take 17 g by mouth daily.      Historical Provider, MD  sennosides-docusate sodium (SENOKOT-S) 8.6-50 MG tablet Take 1 tablet by mouth 2 (two) times daily.    Historical Provider, MD  simvastatin (ZOCOR) 20 MG tablet Take 1 tablet (20 mg total) by mouth daily at 6 PM. 05/12/15   Burtis Junes, NP  triamcinolone cream (KENALOG) 0.1 % Apply 1 application topically 2 (two) times daily. Reported on 08/26/2015    Historical Provider, MD  Trospium Chloride 60 MG CP24 Take 60 mg by mouth daily.    Historical Provider, MD   Pulse 88  Resp 20  SpO2 98% Physical Exam  Constitutional: He is oriented to person, place, and time. He appears well-developed and well-nourished.  Appears uncomfortable/in pain  HENT:  Head: Normocephalic and atraumatic.  Right Ear: External ear normal.  Left Ear: External ear normal.  Nose: Nose normal.  Eyes: Right eye exhibits no discharge. Left eye exhibits no discharge.  Neck: Neck  supple.  Cardiovascular: Normal rate, regular rhythm, normal heart sounds and intact distal pulses.   Pulmonary/Chest: Effort normal and breath sounds normal. He has no wheezes. He has no rales. He exhibits no tenderness.  Abdominal: Soft. There is no tenderness.  Musculoskeletal: He exhibits no edema.  RLE with well healing knee surgical scar. Diffuse swelling compared to left. Mild calf tenderness (states this has been present for 1 month)  Neurological: He is alert and oriented to person, place, and time.  Skin: Skin is warm and dry. He is not diaphoretic.  Nursing note and vitals reviewed.  ED Course  Procedures (including critical care time) Labs Review Labs Reviewed  CBC WITH DIFFERENTIAL/PLATELET - Abnormal; Notable for the following:    Neutro Abs 8.1 (*)    All other components within normal limits  BASIC METABOLIC PANEL - Abnormal; Notable for the following:    Glucose, Bld 146 (*)    All other components within normal limits  D-DIMER, QUANTITATIVE (NOT AT [facility]) - Abnormal; Notable for the following:    D-Dimer, Quant 2.51 (*)    All other components within normal limits  TROPONIN I  H PYLORI, IGM, IGG, IGA AB  HEPATIC FUNCTION PANEL  TROPONIN I  TROPONIN I  LIPASE, BLOOD  TSH    Imaging Review Dg Chest 2 View  09/18/2015  CLINICAL DATA:  Left side chest pain starting this morning EXAM: CHEST  2 VIEW COMPARISON:  07/29/2015 FINDINGS: Borderline cardiomegaly. Study is limited by poor inspiration. Bilateral basilar atelectasis. No gross infiltrate or pulmonary edema. Mild degenerative changes lower thoracic spine. IMPRESSION: Limited study by poor inspiration with bilateral basilar atelectasis. No gross infiltrate or pulmonary edema. Degenerative changes lower thoracic spine. Electronically Signed   By: Lahoma Crocker M.D.   On: 09/18/2015 09:19   Ct Angio Chest Pe W/cm &/or Wo Cm  09/18/2015  CLINICAL DATA:  Shortness of breath and left-sided chest pain EXAM: CT  ANGIOGRAPHY CHEST WITH CONTRAST TECHNIQUE: Multidetector CT imaging of the chest was performed using the standard protocol during bolus administration of intravenous contrast. Multiplanar CT image reconstructions and MIPs were obtained to evaluate the vascular anatomy. CONTRAST:  171mL OMNIPAQUE IOHEXOL 350 MG/ML SOLN COMPARISON:  Chest CT December 11, 2013 ; chest radiograph September 18, 2015 FINDINGS: Mediastinum/Lymph Nodes: There is no demonstrable pulmonary embolus. There is prominence of the ascending thoracic aorta with a maximum transverse diameter of 4.1 x 4.1 cm. There is no thoracic aortic dissection. The visualized great vessels appear unremarkable. There are multiple foci of coronary artery calcification. There is left ventricular hypertrophy. The pericardium is not appreciably thickened. Thyroid appears unremarkable. There is no appreciable thoracic adenopathy. There is a small hiatal hernia. Lungs/Pleura: On axial slice 47 series [id number], there is a stable 4 mm nodular opacity in the anterior segment right upper lobe. On axial slice 59 series [id number], there is a stable 3 mm nodular opacity in the posterior segment of the right upper lobe, stable. There is a 4 mm stable nodular opacity in the anterior segment of the right lower lobe seen on axial slice 67 series [id number]. There is patchy bibasilar atelectatic change. Other nodular opacities seen on prior study are not evident currently. There is no edema or consolidation. There are occasional foci of parietal pleural thickening, also present on prior study. Upper abdomen: In the visualized upper abdomen, no lesions are apparent beyond atherosclerotic calcification in the aorta. Musculoskeletal: There is degenerative change in the thoracic spine. There are no blastic or lytic bone lesions. There is postoperative change in the left shoulder region. Review of the MIP images confirms the above findings. IMPRESSION: No demonstrable pulmonary embolus. Prominence of the  ascending thoracic aorta with maximum transverse diameter of 4.1 x 4.1 cm. Recommend annual imaging followup by CTA or MRA. This recommendation follows 2010 ACCF/AHA/AATS/ACR/ASA/SCA/SCAI/SIR/STS/SVM Guidelines for the Diagnosis and Management of Patients with Thoracic Aortic Disease. Circulation. 2010; 121: [id number] No thoracic aortic dissection. Multiple foci of coronary artery calcification noted. Stable small nodular opacities as noted above in the lung parenchyma. Bibasilar atelectasis. 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 No demonstrable adenopathy. Small hiatal hernia. Electronically Signed   By: Lowella Grip III M.D.   On: 09/18/2015 11:52   I have personally reviewed and evaluated these images and lab results as part of my medical decision-making.   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 No evidence of PE on CT scan. Has a prominent aorta will need to be evaluated further as an outpatient but no dissection or obvious aneurysm. Not significantly reproducible. Possibly GI given recent spicy food yesterday at lunch. However he does have CAD in multiple risk factors. We'll admit to the hospitalist for observation and ACS rule out. Consulted cardiology for consultation.    Sherwood Gambler, MD 09/18/15 1331

## 2015-09-18 NOTE — ED Notes (Signed)
MD at bedside. 

## 2015-09-18 NOTE — Telephone Encounter (Signed)
New Message  Pt wife called states that he was just transported to [facility]. She reports that it may not be heart related. But she wanted Dr. Harrington Challenger to be aware

## 2015-09-18 NOTE — ED Notes (Signed)
Dinner tray, regular diet ordered

## 2015-09-18 NOTE — ED Notes (Signed)
Report attempted 

## 2015-09-18 NOTE — Consult Note (Signed)
CARDIOLOGY CONSULT NOTE   Patient ID: [name] [name] MRN: [id number] DOB/AGE: December 28, 1945 70 y.o.  Admit date: 09/18/2015  Primary Physician   [name] Smolder, MD Primary Cardiologist: Dr. Harrington [name] Reason for Consultation: Chest pain   HPI: [name] is a 70 year old male with a past medical history of non obstructive CAD, last cath in March 2010, HTN, sleep apnea, and hyperlipidemia.  At time of cath in March 2010, he had a mid LAD 30% and proximal RCA 30% with an EF of 55-60%. Myoview prior to his cardiac catheterization 2/10 demonstrated inferior scar with mild to moderate peri-infarct ischemia.     Yesterday, he ate chili beans for lunch and then laid down to take a nap, he awakened with lower chest and upper abdominal pain that was intermittent until the afternoon when it became severe.  He did have some nausea.  No SOB or diaphoresis. Pain was worse with deep inspiration.  He reports that pain also worsened with lying down.  He normally takes Prilosec, but missed his dose of that yesterday.  His pain has resolved with GI cocktail.   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  His BP is well controlled with SBP's in 100-120's range.  Chest x [name] [name] unremarkable.   Of note, he is s/p right total knee arthroscopy last month and has been residing in rehab. Since his surgery, he reports that he has been minimally active and uses a walker to ambulate.  He states that he would like to increase his activity but is limited due to chronic back pain. D-dimer was elevated at 2.51, CT chest shows no PE.     Past Medical History  Diagnosis Date  . Observed sleep apnea     can't wear cpap  . HTN (hypertension)   . GERD (gastroesophageal reflux disease)   . Dyspnea     -PFTs compeltely normal 03/20/08 including DLc0  . Sleep apnea     Most recent sleep study 2010; records at [facility] office  . CAD (coronary artery disease)     cath  3/10: mLAD 30%, pRCA 30%, EF 55-60% Dr. Harrington [name] cardiologist  most recent  stress stress done ~ 2 years ago with Dr. Harrington [name]  . Hypothyroidism   . Low testosterone   . Arthritis   . Depression   . Polycythemia   . Pulmonary nodule 06/13/2012  . Rosacea   . PONV (postoperative nausea and vomiting)   . Complication of anesthesia     "he usually gets an ileus after back OR"   . Environmental allergies     Dust, Smoke     Past Surgical History  Procedure Laterality Date  . Cataract extraction, bilateral  2010  . Meniscus repair  08/2008    left  . Tonsillectomy  09/21/11  . Uvulopalatopharyngoplasty, tonsillectomy and septoplasty  09/21/11    Deviated Septum  . Posterior fusion lumbar spine  2005; 01/2007; 04/2009    L5; L3-4; L2-3  . Lumbar disc surgery  06/2006    L3  . Back surgery  2005; 06/2006; 01/2007; 04/2009  . Prostate surgery  2000  . Transurethral resection of prostate  2006    followed by "surgery to get rid of clots"  . Shoulder open rotator cuff repair Left 1999    left  . Cervical fusion  04/2002    ?C3-4  . Total knee arthroplasty Left 01/2011  left  . Cardiac catheterization  08/2008  . Esophagogastroduodenoscopy  multiple    last 07/2012 GERD esophagitis, 48 Fr dilation  . Colonoscopy    . Knee arthroscopy with medial menisectomy Right 05/14/2015    Procedure: RIGHT KNEE ARTHROSCOPY CHONDROPLASTY, PARTIAL  MEDIAL MENISECTOMY;  Surgeon: [name], MD;  Location: [facility];  Service: Orthopedics;  Laterality: Right;  . Total knee arthroplasty Right 08/08/2015    Procedure: RIGHT TOTAL KNEE ARTHROPLASTY;  Surgeon: [name], MD;  Location: [facility];  Service: Orthopedics;  Laterality: Right;    Allergies  Allergen Reactions  . Penicillins Hives and Other (See Comments)    "I break out in welts" Has patient had a PCN reaction causing immediate rash, facial/tongue/throat swelling, SOB or lightheadedness with hypotension: No Has patient had a PCN  reaction causing severe rash involving mucus membranes or skin necrosis: No Has patient had a PCN reaction that required hospitalization No Has patient had a PCN reaction occurring within the last 10 years: No If all of the above answers are "NO", then may proceed with Cephalosporin use.  . Codeine Nausea And Vomiting  . Sulfonamide Derivatives Nausea And Vomiting    I have reviewed the patient's current medications   . sodium chloride     nitroGLYCERIN  Prior to Admission medications   Medication Sig Start Date End Date Taking? Authorizing Provider  aspirin EC 81 MG tablet Take 81 mg by mouth 2 (two) times daily.   Yes Historical Provider, MD  citalopram (CELEXA) 20 MG tablet Take 10 mg by mouth every other day.    Yes Historical Provider, MD  hydrochlorothiazide (HYDRODIURIL) 25 MG tablet Take 25 mg by mouth daily.    Yes Historical Provider, MD  levothyroxine (SYNTHROID, LEVOTHROID) 150 MCG tablet Take 150 mcg by mouth daily before breakfast.   Yes Historical Provider, MD  losartan (COZAAR) 50 MG tablet Take 50 mg by mouth daily.   Yes Historical Provider, MD  omeprazole (PRILOSEC) 40 MG capsule Take 1 capsule (40 mg total) by mouth daily before breakfast. 30 minutes before 08/18/12  Yes Gatha Mayer, MD  oxyCODONE-acetaminophen (ROXICET) 5-325 MG tablet Take one to two tablets by mouth every 4 hours as needed for severe pain. Do not exceed 4gm of Tylenol in 24 hours Patient taking differently: Take 2 tablets by mouth every 4 (four) hours as needed for moderate pain. Take one to two tablets by mouth every 4 hours as needed for severe pain. Do not exceed 4gm of Tylenol in 24 hours 08/19/15  Yes Lauree Chandler, NP  polyethylene glycol [facility]) packet Take 17 g by mouth daily.     Yes Historical Provider, MD  simvastatin (ZOCOR) 20 MG tablet Take 1 tablet (20 mg total) by mouth daily at 6 PM. 05/12/15  Yes Burtis Junes, NP  triamcinolone cream (KENALOG) 0.1 % Apply 1 application  topically 2 (two) times daily. Reported on 08/26/2015   Yes Historical Provider, MD  Trospium Chloride 60 MG CP24 Take 60 mg by mouth daily.   Yes Historical Provider, MD  sennosides-docusate sodium (SENOKOT-S) 8.6-50 MG tablet Take 1 tablet by mouth 2 (two) times daily.    Historical Provider, MD     Social History   Social History  . Marital Status: Married    Spouse Name: N/A  . Number of Children: 3  . Years of Education: N/A   Occupational History  . retired    Social History Main Topics  .  Smoking status: Former Smoker -- 1.50 packs/day for 18 years    Types: Cigarettes    Quit date: 06/28/1974  . Smokeless tobacco: Never Used  . Alcohol Use: No  . Drug Use: No  . Sexual Activity: No   Other Topics Concern  . Not on file   Social History Narrative   Area, 3 children.   Retired from the city of [place]   5 caffeinated beverages daily    Family Status  Relation Status Death Age  . Mother Deceased   . Father Deceased    Family History  Problem Relation Age of Onset  . Anesthesia problems Neg Hx   . Breast cancer Mother   . Colon polyps Mother   . Diabetes Mother   . Breast cancer Sister   . Diabetes Sister     borderline  . Heart Problems Sister   . Heart attack Father   . Alzheimer's disease Father   . Lung cancer Paternal Uncle     x4  . Throat cancer Maternal Uncle   . Liver cancer Maternal Uncle      ROS:  Full 14 point review of systems complete and found to be negative unless listed above.  Physical Exam: Blood pressure 128/82, pulse 78, resp. rate 16, height 5\' 10"  (1.778 m), weight 270 lb (122.471 kg), SpO2 96 %.  General: Well developed, well nourished,obese male in no acute distress Head: Eyes PERRLA, No xanthomas.  Normocephalic and atraumatic, oropharynx without edema or exudate.  Lungs: CTA Heart: HRRR S1 S2, no rub/gallop, S1, S2 heard, No murmur. Pulses are 2+ extrem.   Neck: Bilateral bruits auscultated. No lymphadenopathy.  No  JVD. Abdomen: Bowel sounds present, abdomen soft and non-tender without masses or hernias noted. Msk:  No spine or cva tenderness. No weakness, no joint deformities or effusions. Extremities: No clubbing or cyanosis.  No edema.  Neuro: Alert and oriented X 3. No focal deficits noted. Psych:  Good affect, responds appropriately Skin: No rashes or lesions noted.  Labs:   Lab Results  Component Value Date   WBC 10.4 09/18/2015   HGB 14.0 09/18/2015   HCT 42.3 09/18/2015   MCV 94.4 09/18/2015   PLT 207 09/18/2015    Recent Labs Lab 09/18/15 0851 09/18/15 1302  NA 136  --   K 4.0  --   CL 103  --   CO2 23  --   BUN 19  --   CREATININE 0.82  --   CALCIUM 9.3  --   PROT  --  6.6  BILITOT  --  0.7  ALKPHOS  --  59  ALT  --  14*  AST  --  19  GLUCOSE 146*  --   ALBUMIN  --  3.3*    Recent Labs  09/18/15 0851  TROPONINI <0.03    Lab Results  Component Value Date   DDIMER 2.51* 09/18/2015     Echo: pending.   ECG:  NSR with RBBB and LAFB  Radiology:  Dg Chest 2 View  09/18/2015  CLINICAL DATA:  Left side chest pain starting this morning EXAM: CHEST  2 VIEW COMPARISON:  07/29/2015 FINDINGS: Borderline cardiomegaly. Study is limited by poor inspiration. Bilateral basilar atelectasis. No gross infiltrate or pulmonary edema. Mild degenerative changes lower thoracic spine. IMPRESSION: Limited study by poor inspiration with bilateral basilar atelectasis. No gross infiltrate or pulmonary edema. Degenerative changes lower thoracic spine. Electronically Signed   By: Lahoma Crocker M.D.   On:  09/18/2015 09:19   Ct Angio Chest Pe W/cm &/or Wo Cm  09/18/2015  CLINICAL DATA:  Shortness of breath and left-sided chest pain EXAM: CT ANGIOGRAPHY CHEST WITH CONTRAST TECHNIQUE: Multidetector CT imaging of the chest was performed using the standard protocol during bolus administration of intravenous contrast. Multiplanar CT image reconstructions and MIPs were obtained to evaluate the vascular  anatomy. CONTRAST:  124mL OMNIPAQUE IOHEXOL 350 MG/ML SOLN COMPARISON:  Chest CT December 11, 2013 ; chest radiograph September 18, 2015 FINDINGS: Mediastinum/Lymph Nodes: There is no demonstrable pulmonary embolus. There is prominence of the ascending thoracic aorta with a maximum transverse diameter of 4.1 x 4.1 cm. There is no thoracic aortic dissection. The visualized great vessels appear unremarkable. There are multiple foci of coronary artery calcification. There is left ventricular hypertrophy. The pericardium is not appreciably thickened. Thyroid appears unremarkable. There is no appreciable thoracic adenopathy. There is a small hiatal hernia. Lungs/Pleura: On axial slice 47 series [id number], there is a stable 4 mm nodular opacity in the anterior segment right upper lobe. On axial slice 59 series [id number], there is a stable 3 mm nodular opacity in the posterior segment of the right upper lobe, stable. There is a 4 mm stable nodular opacity in the anterior segment of the right lower lobe seen on axial slice 67 series [id number]. There is patchy bibasilar atelectatic change. Other nodular opacities seen on prior study are not evident currently. There is no edema or consolidation. There are occasional foci of parietal pleural thickening, also present on prior study. Upper abdomen: In the visualized upper abdomen, no lesions are apparent beyond atherosclerotic calcification in the aorta. Musculoskeletal: There is degenerative change in the thoracic spine. There are no blastic or lytic bone lesions. There is postoperative change in the left shoulder region. Review of the MIP images confirms the above findings. IMPRESSION: No demonstrable pulmonary embolus. Prominence of the ascending thoracic aorta with maximum transverse diameter of 4.1 x 4.1 cm. Recommend annual imaging followup by CTA or MRA. This recommendation follows 2010 ACCF/AHA/AATS/ACR/ASA/SCA/SCAI/SIR/STS/SVM Guidelines for the Diagnosis and Management of Patients with Thoracic  Aortic Disease. Circulation. 2010; 121: [id number] No thoracic aortic dissection. Multiple foci of coronary artery calcification noted. Stable small nodular opacities as noted above in the lung parenchyma. Bibasilar atelectasis. 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 No demonstrable adenopathy. Small hiatal hernia. Electronically Signed   By: Lowella Grip III M.D.   On: 09/18/2015 11:52    ASSESSMENT AND PLAN:    Principal Problem:   Atypical chest pain Active Problems:   Mixed hyperlipidemia   Essential hypertension   CAD, NATIVE VESSEL   G E R D   Constipation, slow transit   Status post right knee replacement   Erosive esophagitis (history of with prior dilatation 2014)   OSA (obstructive sleep apnea)   Positive D dimer   Mild concentric left ventricular hypertrophy (LVH)   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HCC)   Hypothyroidism   1. Atypical chest pain- Patient's symptoms are consistent with possible GI vs musculoskeletal  Worse after eating chili last night "Felt like I was going to get sick"  Pain sharp, intermitt, pleuritic On exam Some point tenderness along ribs CT shows no evidence of PE. We will follow troponins   He is chest pain free currently and his symptoms were relieved by GI cocktail.  We will order another troponin, if negative he can be released to home from a cardiac standpoint  WIll  arrange for out pt follow up   CT scan did show mild dilation of aorta (41 mm) SOme coronary calcifications  .WIll need to be followed long term  Continue statin.   Signed: Arbutus Leas, NP 09/18/2015 1:39 PM Pager [phone]  Patient seen and examined  I agree with findings as noted above  I have amended note to reflect my findings    I have known pt from outpt setting  Hx of mild CAD   Presetns with CP  Pain is atypical   On exam:  Lungs CTA  Cardiac RRR  No murmurs  Neck  Full Chest  Mild tenderness L side   Ext no edema  EKG without acute changes  Trop x 1 normal  I do not think that quality of pain suggests that it is cardiac in origin  WOuld cehc another trop (now 8 hours post)  If neg OK to d/c from cardaic stanpoint  Will make sure that he has f/u in clinic .  [name]

## 2015-09-18 NOTE — Consult Note (Signed)
[facility] Consult: 1:48 PM 09/18/2015     Referring Provider: Dr [name]  Primary Care Physician:  [name], MD Primary Gastroenterologist:  Dr. Carlean Purl   Reason for Consultation:  Dysphagia, and chest pain   HPI: [name] [name] is a 70 y.o. male.  Hx htn, GERD, non-obstructive CAD on cath in 2010.  Obesity.  OSA, does not tolerate bipap.  Hx post op ileus after spine surgery.  S/p 04/2015 right knee arthroscopic meniscus repair. S/p 08/08/15 right TKA  Hx esophageal stricture, dilations.   07/2012 EGD: for dysphagia.  Esophagitis.  Maloney dilatation but no stricture seen.  Path: benign, reacitve, mild inflammation 72007 Colonoscopy: routine screening.  Normal.    1996 EGD:  For dysphagia.  Distal, benign esophageal stricture, dilated.  HH.   Since knee surgery has been taking 6 percocet per day for knee pain.  His chronic constipation is worse and his normal dilay Miralax not always effective as it had been before surgery.   Ate chili and burger at lunch, per his routine he laid down for nap and woke up a few hours later with 10/10 pain in left lower chest, below breast and some in the lower sternum and upper left abdomen.  No dyspnea, cough, n/v, sweat, palpitations, sob. Pain lasted through present.  Came to ED.  No relief with sl ntg but some relief with GI cocktail.  No recent activity which might have strained his side or chest.  LFTs normal.  Lipase is pending.  First in series Troponin is normal.  D dimer up to 2.5.  Non-ischemic EKG.   CT angio of chest: prominent thoracic aorta: 4.1 x 4.1 CM.  No PE.  Stable nodular opacities.  Small HH.  Unremarkable upper abdomen. 2V abdomen:  Increased stool burden.   LE Dopplers negative for DVT.   Echo ordered.  Cardiology unimpressed.    Past Medical  History  Diagnosis Date  . Observed sleep apnea     can't wear cpap  . HTN (hypertension)   . GERD (gastroesophageal reflux disease)   . Dyspnea     -PFTs compeltely normal 03/20/08 including DLc0  . Sleep apnea     Most recent sleep study 2010; records at [facility] office  . CAD (coronary artery disease)     cath 3/10: mLAD 30%, pRCA 30%, EF 55-60% Dr. Harrington Challenger cardiologist  most recent  stress stress done ~ 2 years ago with Dr. Harrington Challenger  . Hypothyroidism   . Low testosterone   . Arthritis   . Depression   . Polycythemia   . Pulmonary nodule 06/13/2012  . Rosacea   . PONV (postoperative nausea and vomiting)   . Complication of anesthesia     "he usually gets an ileus after back OR"   . Environmental allergies     Dust, Smoke    Past Surgical History  Procedure Laterality Date  . Cataract extraction, bilateral  2010  . Meniscus repair  08/2008    left  . Tonsillectomy  09/21/11  . Uvulopalatopharyngoplasty, tonsillectomy and septoplasty  09/21/11    Deviated Septum  . Posterior fusion lumbar spine  2005; 01/2007; 04/2009    L5; L3-4; L2-3  . Lumbar disc surgery  06/2006    L3  . Back surgery  2005; 06/2006; 01/2007; 04/2009  . Prostate surgery  2000  . Transurethral resection of prostate  2006    followed by "surgery to get rid of clots"  . Shoulder open rotator cuff repair Left 1999    left  . Cervical fusion  04/2002    ?C3-4  . 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 Cardiac catheterization  08/2008  . Esophagogastroduodenoscopy  multiple    last 07/2012 GERD esophagitis, 48 Fr dilation  . Colonoscopy    . Knee arthroscopy with medial menisectomy Right 05/14/2015    Procedure: RIGHT KNEE ARTHROSCOPY CHONDROPLASTY, PARTIAL  MEDIAL MENISECTOMY;  Surgeon: [name], MD;  Location: [place];  Service: Orthopedics;  Laterality: Right;  . Total knee arthroplasty Right 08/08/2015    Procedure: RIGHT TOTAL KNEE ARTHROPLASTY;  Surgeon: [name], MD;   Location: [place];  Service: Orthopedics;  Laterality: Right;    Prior to Admission medications   Medication Sig Start Date End Date Taking? Authorizing Provider  aspirin EC 81 MG tablet Take 81 mg by mouth 2 (two) times daily.   Yes Historical Provider, MD  citalopram (CELEXA) 20 MG tablet Take 10 mg by mouth every other day.    Yes Historical Provider, MD  hydrochlorothiazide (HYDRODIURIL) 25 MG tablet Take 25 mg by mouth daily.    Yes Historical Provider, MD  levothyroxine (SYNTHROID, LEVOTHROID) 150 MCG tablet Take 150 mcg by mouth daily before breakfast.   Yes Historical Provider, MD  losartan (COZAAR) 50 MG tablet Take 50 mg by mouth daily.   Yes Historical Provider, MD  omeprazole (PRILOSEC) 40 MG capsule Take 1 capsule (40 mg total) by mouth daily before breakfast. 30 minutes before 08/18/12  Yes Gatha Mayer, MD  oxyCODONE-acetaminophen (ROXICET) 5-325 MG tablet Take one to two tablets by mouth every 4 hours as needed for severe pain. Do not exceed 4gm of Tylenol in 24 hours Patient taking differently: Take 2 tablets by mouth every 4 (four) hours as needed for moderate pain. Take one to two tablets by mouth every 4 hours as needed for severe pain. Do not exceed 4gm of Tylenol in 24 hours 08/19/15  Yes Lauree Chandler, NP  polyethylene glycol [facility]) packet Take 17 g by mouth daily.     Yes Historical Provider, MD  simvastatin (ZOCOR) 20 MG tablet Take 1 tablet (20 mg total) by mouth daily at 6 PM. 05/12/15  Yes Burtis Junes, NP  triamcinolone cream (KENALOG) 0.1 % Apply 1 application topically 2 (two) times daily. Reported on 08/26/2015   Yes Historical Provider, MD  Trospium Chloride 60 MG CP24 Take 60 mg by mouth daily.   Yes Historical Provider, MD  sennosides-docusate sodium (SENOKOT-S) 8.6-50 MG tablet Take 1 tablet by mouth 2 (two) times daily.    Historical Provider, MD    Scheduled Meds:   Infusions: . sodium chloride     PRN Meds: nitroGLYCERIN   Allergies as of  09/18/2015 - Review Complete 09/18/2015  Allergen Reaction Noted  . Penicillins Hives and Other (See Comments) 07/14/2007  . Codeine Nausea And Vomiting 07/14/2007  . Sulfonamide derivatives Nausea And Vomiting 07/14/2007    Family History  Problem Relation Age of Onset  . Anesthesia problems Neg Hx   .  Breast cancer Mother   . Colon polyps Mother   . Diabetes Mother   . Breast cancer Sister   . Diabetes Sister     borderline  . Heart Problems Sister   . Heart attack Father   . Alzheimer's disease Father   . Lung cancer Paternal Uncle     x4  . Throat cancer Maternal Uncle   . Liver cancer Maternal Uncle     Social History   Social History  . Marital Status: Married    Spouse Name: N/A  . Number of Children: 3  . Years of Education: N/A   Occupational History  . retired    Social History Main Topics  . Smoking status: Former Smoker -- 1.50 packs/day for 18 years    Types: Cigarettes    Quit date: 06/28/1974  . Smokeless tobacco: Never Used  . Alcohol Use: No  . Drug Use: No  . Sexual Activity: No   Other Topics Concern  . Not on file   Social History Narrative   Area, 3 children.   Retired from the city of [place]   5 caffeinated beverages daily    REVIEW OF SYSTEMS: Constitutional:  Weight loss of more than 10 # snce 07/2015 knee surgery.  No fatigue ENT:  No nose bleeds Pulm:  No dyspnea but inspiration increases the left chest pain.  CV:  No palpitations, no LE edema. Per HPI GU:  No hematuria, no frequency GI:  Per HPI Heme:  No unusual bleeding or excessive bruisng   Transfusions:  None  Neuro:  No headaches, no peripheral tingling or numbness Derm:  No itching, no rash or sores.  Endocrine:  No sweats or chills.  No polyuria or dysuria Immunization:  Not queried.  Travel:  None beyond local counties in last few months.    PHYSICAL EXAM: Vital signs in last 24 hours: Filed Vitals:   09/18/15 1230 09/18/15 1315  BP: 108/63 128/82    Pulse: 77 78  Resp: 15 16   Wt Readings from Last 3 Encounters:  09/18/15 122.471 kg (270 lb)  08/26/15 126.916 kg (279 lb 12.8 oz)  08/19/15 127.914 kg (282 lb)    General: obese, comfortable, alert.  Not ill looking Head:  No trauma, swelling or asymmetry  Eyes:  No icterus or pallor Ears:  + HOH  Nose:  No discharge.   Mouth:  Dentures in place.  Moist and clear oral MM Neck:  No mass or JVD.   Lungs:  Clear bil.   Heart: RRR Abdomen:  Soft, obese, NT, ND.   Rectal: deferred   Musc/Skeltl: CDI incision scar on knee.  Extremities:  No CCE  Neurologic:  Oriented x 3.  No tremor.  Moves all 4 limbs, strength not tested.  Skin:  No rash, no telangectasia.   Tattoos:  none Nodes:  No cervical adenopathy.    Psych:  Pleasant, cooperative, calm, not depressed or anxious.   Intake/Output from previous day:   Intake/Output this shift:    LAB RESULTS:  Recent Labs  09/18/15 0851  WBC 10.4  HGB 14.0  HCT 42.3  PLT 207   BMET Lab Results  Component Value Date   NA 136 09/18/2015   NA 137 08/14/2015   NA 135 08/09/2015   K 4.0 09/18/2015   K 4.5 08/14/2015   K 3.8 08/09/2015   CL 103 09/18/2015   CL 98* 08/09/2015   CL 102 07/29/2015   CO2 23 09/18/2015  CO2 27 08/09/2015   CO2 29 07/29/2015   GLUCOSE 146* 09/18/2015   GLUCOSE 104* 08/09/2015   GLUCOSE 80 07/29/2015   BUN 19 09/18/2015   BUN 13 08/14/2015   BUN 14 08/09/2015   CREATININE 0.82 09/18/2015   CREATININE 0.8 08/14/2015   CREATININE 0.84 08/09/2015   CALCIUM 9.3 09/18/2015   CALCIUM 8.7* 08/09/2015   CALCIUM 9.6 07/29/2015   LFT  Recent Labs  09/18/15 1302  PROT 6.6  ALBUMIN 3.3*  AST 19  ALT 14*  ALKPHOS 59  BILITOT 0.7  BILIDIR 0.2  IBILI 0.5   PT/INR Lab Results  Component Value Date   INR 1.03 07/29/2015   INR 0.99 02/10/2011   INR 1.0 RATIO 08/22/2008    Drugs of Abuse  No results found for: LABOPIA, COCAINSCRNUR, LABBENZ, AMPHETMU, THCU, LABBARB   RADIOLOGY  STUDIES: Dg Chest 2 View  09/18/2015  CLINICAL DATA:  Left side chest pain starting this morning EXAM: CHEST  2 VIEW COMPARISON:  07/29/2015 FINDINGS: Borderline cardiomegaly. Study is limited by poor inspiration. Bilateral basilar atelectasis. No gross infiltrate or pulmonary edema. Mild degenerative changes lower thoracic spine. IMPRESSION: Limited study by poor inspiration with bilateral basilar atelectasis. No gross infiltrate or pulmonary edema. Degenerative changes lower thoracic spine. Electronically Signed   By: Lahoma Crocker M.D.   On: 09/18/2015 09:19   Ct Angio Chest Pe W/cm &/or Wo Cm  09/18/2015  CLINICAL DATA:  Shortness of breath and left-sided chest pain EXAM: CT ANGIOGRAPHY CHEST WITH CONTRAST TECHNIQUE: Multidetector CT imaging of the chest was performed using the standard protocol during bolus administration of intravenous contrast. Multiplanar CT image reconstructions and MIPs were obtained to evaluate the vascular anatomy. CONTRAST:  134mL OMNIPAQUE IOHEXOL 350 MG/ML SOLN COMPARISON:  Chest CT December 11, 2013 ; chest radiograph September 18, 2015 FINDINGS: Mediastinum/Lymph Nodes: There is no demonstrable pulmonary embolus. There is prominence of the ascending thoracic aorta with a maximum transverse diameter of 4.1 x 4.1 cm. There is no thoracic aortic dissection. The visualized great vessels appear unremarkable. There are multiple foci of coronary artery calcification. There is left ventricular hypertrophy. The pericardium is not appreciably thickened. Thyroid appears unremarkable. There is no appreciable thoracic adenopathy. There is a small hiatal hernia. Lungs/Pleura: On axial slice 47 series [id number], there is a stable 4 mm nodular opacity in the anterior segment right upper lobe. On axial slice 59 series [id number], there is a stable 3 mm nodular opacity in the posterior segment of the right upper lobe, stable. There is a 4 mm stable nodular opacity in the anterior segment of the right lower lobe seen on  axial slice 67 series [id number]. There is patchy bibasilar atelectatic change. Other nodular opacities seen on prior study are not evident currently. There is no edema or consolidation. There are occasional foci of parietal pleural thickening, also present on prior study. Upper abdomen: In the visualized upper abdomen, no lesions are apparent beyond atherosclerotic calcification in the aorta. Musculoskeletal: There is degenerative change in the thoracic spine. There are no blastic or lytic bone lesions. There is postoperative change in the left shoulder region. Review of the MIP images confirms the above findings. IMPRESSION: No demonstrable pulmonary embolus. Prominence of the ascending thoracic aorta with maximum transverse diameter of 4.1 x 4.1 cm. Recommend annual imaging followup by CTA or MRA. This recommendation follows 2010 ACCF/AHA/AATS/ACR/ASA/SCA/SCAI/SIR/STS/SVM Guidelines for the Diagnosis and Management of Patients with Thoracic Aortic Disease. Circulation. 2010; 121: [id number] No thoracic aortic dissection. Multiple  foci of coronary artery calcification noted. Stable small nodular opacities as noted above in the lung parenchyma. Bibasilar atelectasis. 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 No demonstrable adenopathy. Small hiatal hernia. Electronically Signed   By: Lowella Grip III M.D.   On: 09/18/2015 11:52    ENDOSCOPIC STUDIES: Per HPI  IMPRESSION:   *  Acute left upper abdominal and lower chest pain.  This is atypical for cardiac source.  Labs do not support any issues with liver, Lipase is pending.  Hx of dysphagia, this is stable and primarily to solids.  Hx of esophageal stricture 1996, no stricture on EGD with empiric dilatation in 2014.  Compliant with daily PPI.    *  Chronic constipation.  Worse in setting of 6 percocet per day, despite Miralax and senokot ( not clear he is using the latter)  PLAN:     *   Per Dr Fuller Plan. Ok to eat,    Azucena Freed  09/18/2015, 1:48 PM Pager: [phone]      Attending physician's note   I have taken a history, examined the patient and reviewed the chart. I agree with the Advanced Practitioner's note, impression and recommendations. Acute left chest pain and LUQ abdominal pain. Symptoms noted following eating chili last night. Chest pain exacerbated by deep breathing. He has ongoing mild solid food dysphagia. PPI bid for now. Abd films show increase stool burden. Chest CT angio did not show a PE. Symptoms partially improved with GI cocktail. EGD with possible dilation tomorrow to further evaluate. The risks (including bleeding, perforation, infection, missed lesions, medication reactions and possible hospitalization or surgery if complications occur), benefits, and alternatives to endoscopy with possible biopsy and possible dilation were discussed with the patient and they consent to proceed.    Lucio Edward, MD [facility] [phone] Mon-Fri 8a-5p [phone] after 5p, weekends, holidays

## 2015-09-18 NOTE — Progress Notes (Addendum)
Abdominal films reveal significant stool burden throughout colon. In addition to previously ordered laxatives and stool softeners I have ordered a one-time dose of Dulcolax suppository as well as one bottle of magnesium citrate  Erin Hearing, ANP

## 2015-09-18 NOTE — H&P (Signed)
[facility] History and Physical                                                                                    [name] [name], is a 70 y.o. male  MRN: [id number]   DOB - Feb 18, 1946  Admit Date - 09/18/2015  Outpatient Primary MD for the patient is [name] Smolder, MD  Referring MD: [name] / ER  Consulting M.D: [name] (paged by EDP); [name] [facility]  PMH: Past Medical History  Diagnosis Date  . Observed sleep apnea     can't wear cpap  . HTN (hypertension)   . GERD (gastroesophageal reflux disease)   . Dyspnea     -PFTs compeltely normal 03/20/08 including DLc0  . Sleep apnea     Most recent sleep study 2010; records at [facility] office  . CAD (coronary artery disease)     cath 3/10: mLAD 30%, pRCA 30%, EF 55-60% Dr. Harrington Challenger cardiologist  most recent  stress stress done ~ 2 years ago with Dr. Harrington Challenger  . Hypothyroidism   . Low testosterone   . Arthritis   . Depression   . Polycythemia   . Pulmonary nodule 06/13/2012  . Rosacea   . PONV (postoperative nausea and vomiting)   . Complication of anesthesia     "he usually gets an ileus after back OR"   . Environmental allergies     Dust, Smoke      PSH: Past Surgical History  Procedure Laterality Date  . Cataract extraction, bilateral  2010  . Meniscus repair  08/2008    left  . Tonsillectomy  09/21/11  . Uvulopalatopharyngoplasty, tonsillectomy and septoplasty  09/21/11    Deviated Septum  . Posterior fusion lumbar spine  2005; 01/2007; 04/2009    L5; L3-4; L2-3  . Lumbar disc surgery  06/2006    L3  . Back surgery  2005; 06/2006; 01/2007; 04/2009  . Prostate surgery  2000  . Transurethral resection of prostate  2006    followed by "surgery to get rid of clots"  . Shoulder open rotator cuff repair Left 1999    left  . Cervical fusion  04/2002    ?C3-4  . 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 Cardiac catheterization  08/2008  . Esophagogastroduodenoscopy  multiple    last 07/2012  GERD esophagitis, 48 Fr dilation  . Colonoscopy    . Knee arthroscopy with medial menisectomy Right 05/14/2015    Procedure: RIGHT KNEE ARTHROSCOPY CHONDROPLASTY, PARTIAL  MEDIAL MENISECTOMY;  Surgeon: [name], MD;  Location: [place];  Service: Orthopedics;  Laterality: Right;  . Total knee arthroplasty Right 08/08/2015    Procedure: RIGHT TOTAL KNEE ARTHROPLASTY;  Surgeon: [name], MD;  Location: [place];  Service: Orthopedics;  Laterality: Right;     CC:  Chief Complaint  Patient presents with  . Chest Pain     HPI: 70 year old male patient with known sleep apnea intolerant to CPAP, hypertension, obesity, dyslipidemia, GERD with prior erosive esophagitis status post esophageal dilatation secondary stricture, recent right total knee replacement discharged from rehabilitation therapy, chronic constipation. Patient has a history of nonobstructive  CAD based on cardiac catheterization in 2010 and has been on medical therapy. Patient reports that yesterday he ate chili with beans for lunch and laid down to take a nap. After awakening from his nap he began having lower chest / upper left abdominal pain that waxed and waned and became more severe as the afternoon wore on. His only other symptom was of mild nausea. No shortness of breath, no diaphoresis. He did report a pleuritic component with noted left upper quadrant pain with trying to breathe. Symptoms had continued to wax and wane but the patient did notice that after going to bed the pain was worse with laying down/when supine. He apparently did not take any medications to treat the symptoms although he typically takes Prilosec once a day. He initially told the EDP he was having chest pain and pointed to complaining of pain underneath the left rib cage and someone in the left lateral and mid abdomen. He has noticed issues recently with difficulty swallowing meats. In addition he's been complaining of dizzy spells since  his surgical procedure and intermittent nausea. He reports he has issues with chronic constipation just been worse since his surgery noting he is on narcotics postoperatively. He reports that sometimes he only has a bowel movement once every 6 days. He has not noticed any blood in his stools or any dark stools. He reports his abdomen does feel somewhat bloated and distended today.   ER Evaluation and treatment:  BP 124/57, pulse 88, respirations 20, room air saturations 98%. EKG: Sinus rhythm with ventricular rate 88 bpm, QTC 467 ms, right bundle branch block which is unchanged from previous, voltage criteria met for LVH. Two-view chest x-[place]: Study limited by poor inspiration with apparent bilateral basilar atelectasis but without gross infiltrate or edema CTA of chest-PE protocol: No pulmonary embolism, prominence of the ascending thoracic aorta with a maximum transverse diameter of 4.1 x 4.1 cm and no evidence of aortic dissection-prominence of acsending thoracic aorta with maximum transverse diameter 4.1 x 4.1 cm Lab data: D-dimer 2.51, sodium 136 potassium 4.0, BUN 19, creatinine 0.82, glucose 146, troponin less than 0.03, WBC 10,400 with essentially normal differential slightly elevated absolute neutrophils, platelets 207,000, hemoglobin 14 Right lower extremity venous duplex (preliminary): No evidence of DVT or SVT and right lower extremity  Review of Systems   In addition to the HPI above,  No Fever-chills, myalgias or other constitutional symptoms No Headache, changes with Vision or hearing, new weakness, tingling, numbness in any extremity, No problems swallowing food or Liquids, indigestion/reflux No Cough or Shortness of Breath, palpitations, orthopnea or DOE-reports left lower chest/left upper quadrant discomfort with inspiratory effort No Mrs., melena or hematochezia, no dark tarry stools No dysuria, hematuria or flank pain No new skin rashes, lesions, masses or bruises, No new  joints pains-aches No recent weight gain or loss No polyuria, polydypsia or polyphagia,  *A full 10 point Review of Systems was done, except as stated above, all other Review of Systems were negative.  Social History Social History  Substance Use Topics  . Smoking status: Former Smoker -- 1.50 packs/day for 18 years    Types: Cigarettes    Quit date: 06/28/1974  . Smokeless tobacco: Never Used  . Alcohol Use: No    Resides at: Private residence  Lives with: Spouse  Ambulatory status: Rolling walker   Family History Family History  Problem Relation Age of Onset  . Anesthesia problems Neg Hx   . Breast cancer Mother   .  Colon polyps Mother   . Diabetes Mother   . Breast cancer Sister   . Diabetes Sister     borderline  . Heart Problems Sister   . Heart attack Father   . Alzheimer's disease Father   . Lung cancer Paternal Uncle     x4  . Throat cancer Maternal Uncle   . Liver cancer Maternal Uncle      Prior to Admission medications   Medication Sig Start Date End Date Taking? Authorizing Provider  aspirin EC 81 MG tablet Take 81 mg by mouth 2 (two) times daily.   Yes Historical Provider, MD  citalopram (CELEXA) 20 MG tablet Take 10 mg by mouth every other day.    Yes Historical Provider, MD  hydrochlorothiazide (HYDRODIURIL) 25 MG tablet Take 25 mg by mouth daily.    Yes Historical Provider, MD  levothyroxine (SYNTHROID, LEVOTHROID) 150 MCG tablet Take 150 mcg by mouth daily before breakfast.   Yes Historical Provider, MD  losartan (COZAAR) 50 MG tablet Take 50 mg by mouth daily.   Yes Historical Provider, MD  omeprazole (PRILOSEC) 40 MG capsule Take 1 capsule (40 mg total) by mouth daily before breakfast. 30 minutes before 08/18/12  Yes Gatha Mayer, MD  oxyCODONE-acetaminophen (ROXICET) 5-325 MG tablet Take one to two tablets by mouth every 4 hours as needed for severe pain. Do not exceed 4gm of Tylenol in 24 hours Patient taking differently: Take 2 tablets by  mouth every 4 (four) hours as needed for moderate pain. Take one to two tablets by mouth every 4 hours as needed for severe pain. Do not exceed 4gm of Tylenol in 24 hours 08/19/15  Yes Lauree Chandler, NP  polyethylene glycol [facility]) packet Take 17 g by mouth daily.     Yes Historical Provider, MD  simvastatin (ZOCOR) 20 MG tablet Take 1 tablet (20 mg total) by mouth daily at 6 PM. 05/12/15  Yes Burtis Junes, NP  triamcinolone cream (KENALOG) 0.1 % Apply 1 application topically 2 (two) times daily. Reported on 08/26/2015   Yes Historical Provider, MD  Trospium Chloride 60 MG CP24 Take 60 mg by mouth daily.   Yes Historical Provider, MD  sennosides-docusate sodium (SENOKOT-S) 8.6-50 MG tablet Take 1 tablet by mouth 2 (two) times daily.    Historical Provider, MD    Allergies  Allergen Reactions  . Penicillins Hives and Other (See Comments)    "I break out in welts" Has patient had a PCN reaction causing immediate rash, facial/tongue/throat swelling, SOB or lightheadedness with hypotension: No Has patient had a PCN reaction causing severe rash involving mucus membranes or skin necrosis: No Has patient had a PCN reaction that required hospitalization No Has patient had a PCN reaction occurring within the last 10 years: No If all of the above answers are "NO", then may proceed with Cephalosporin use.  . Codeine Nausea And Vomiting  . Sulfonamide Derivatives Nausea And Vomiting    Physical Exam  Vitals  Blood pressure 113/57, pulse 78, resp. rate 20, height '5\' 10"'$  (1.778 m), weight 270 lb (122.471 kg), SpO2 94 %.   General:  In no acute distress, appears healthy and well nourished, overweight  Psych:  Normal affect, Denies Suicidal or Homicidal ideations, Awake Alert, Oriented X 3. Speech and thought patterns are clear and appropriate, no apparent short term memory deficits  Neuro:   No focal neurological deficits, CN II through XII intact, Strength 5/5 all 4 extremities, Sensation  intact all 4  extremities.  ENT:  Ears and Eyes appear Normal, Conjunctivae clear, PER. Moist oral mucosa without erythema or exudates.  Neck:  Supple, No lymphadenopathy appreciated  Respiratory:  Symmetrical chest wall movement, slightly diminished air movement bilaterally secondary to respiratory splinting due to pain, CTAB. Room Air  Cardiac:  RRR, No Murmurs, focal RLE edema noted (patient's operative extremity), no JVD, No carotid bruits, peripheral pulses palpable at 2+  Abdomen:  Positive bowel sounds, Soft, slightly tender left upper quadrant as well as left lateral abdomen at the level of the umbilicus, somewhat distended,  No masses appreciated, no obvious hepatosplenomegaly difficult to ascertain secondary to patient's large abdominal girth  Skin:  No Cyanosis, Normal Skin Turgor, No Skin Rash or Bruise. Well-healed scar  Extremities: Symmetrical without obvious trauma or injury,  no effusions.  Data Review  CBC  Recent Labs Lab 09/18/15 0851  WBC 10.4  HGB 14.0  HCT 42.3  PLT 207  MCV 94.4  MCH 31.3  MCHC 33.1  RDW 13.8  LYMPHSABS 1.2  MONOABS 1.0  EOSABS 0.1  BASOSABS 0.0    Chemistries   Recent Labs Lab 09/18/15 0851  NA 136  K 4.0  CL 103  CO2 23  GLUCOSE 146*  BUN 19  CREATININE 0.82  CALCIUM 9.3    estimated creatinine clearance is 113.2 mL/min (by C-G formula based on Cr of 0.82).  No results for input(s): TSH, T4TOTAL, T3FREE, THYROIDAB in the last 72 hours.  Invalid input(s): FREET3  Coagulation profile No results for input(s): INR, PROTIME in the last 168 hours.   Recent Labs  09/18/15 0851  DDIMER 2.51*    Cardiac Enzymes  Recent Labs Lab 09/18/15 0851  TROPONINI <0.03    Invalid input(s): POCBNP  Urinalysis    Component Value Date/Time   COLORURINE YELLOW 07/29/2015 [place] 07/29/2015 1245   LABSPEC 1.012 07/29/2015 1245   PHURINE 6.5 07/29/2015 1245   GLUCOSEU NEGATIVE 07/29/2015 1245    HGBUR NEGATIVE 07/29/2015 1245   BILIRUBINUR NEGATIVE 07/29/2015 1245   KETONESUR NEGATIVE 07/29/2015 1245   PROTEINUR NEGATIVE 07/29/2015 1245   UROBILINOGEN 1.0 02/23/2011 1151   NITRITE NEGATIVE 07/29/2015 1245   LEUKOCYTESUR NEGATIVE 07/29/2015 1245    Imaging results:   Dg Chest 2 View  09/18/2015  CLINICAL DATA:  Left side chest pain starting this morning EXAM: CHEST  2 VIEW COMPARISON:  07/29/2015 FINDINGS: Borderline cardiomegaly. Study is limited by poor inspiration. Bilateral basilar atelectasis. No gross infiltrate or pulmonary edema. Mild degenerative changes lower thoracic spine. IMPRESSION: Limited study by poor inspiration with bilateral basilar atelectasis. No gross infiltrate or pulmonary edema. Degenerative changes lower thoracic spine. Electronically Signed   By: Lahoma Crocker M.D.   On: 09/18/2015 09:19   Ct Angio Chest Pe W/cm &/or Wo Cm  09/18/2015  CLINICAL DATA:  Shortness of breath and left-sided chest pain EXAM: CT ANGIOGRAPHY CHEST WITH CONTRAST TECHNIQUE: Multidetector CT imaging of the chest was performed using the standard protocol during bolus administration of intravenous contrast. Multiplanar CT image reconstructions and MIPs were obtained to evaluate the vascular anatomy. CONTRAST:  153m OMNIPAQUE IOHEXOL 350 MG/ML SOLN COMPARISON:  Chest CT December 11, 2013 ; chest radiograph September 18, 2015 FINDINGS: Mediastinum/Lymph Nodes: There is no demonstrable pulmonary embolus. There is prominence of the ascending thoracic aorta with a maximum transverse diameter of 4.1 x 4.1 cm. There is no thoracic aortic dissection. The visualized great vessels appear unremarkable. There are multiple foci of coronary artery  calcification. There is left ventricular hypertrophy. The pericardium is not appreciably thickened. Thyroid appears unremarkable. There is no appreciable thoracic adenopathy. There is a small hiatal hernia. Lungs/Pleura: On axial slice 47 series 585, there is a stable 4 mm  nodular opacity in the anterior segment right upper lobe. On axial slice 59 series 277, there is a stable 3 mm nodular opacity in the posterior segment of the right upper lobe, stable. There is a 4 mm stable nodular opacity in the anterior segment of the right lower lobe seen on axial slice 67 series 824. There is patchy bibasilar atelectatic change. Other nodular opacities seen on prior study are not evident currently. There is no edema or consolidation. There are occasional foci of parietal pleural thickening, also present on prior study. Upper abdomen: In the visualized upper abdomen, no lesions are apparent beyond atherosclerotic calcification in the aorta. Musculoskeletal: There is degenerative change in the thoracic spine. There are no blastic or lytic bone lesions. There is postoperative change in the left shoulder region. Review of the MIP images confirms the above findings. IMPRESSION: No demonstrable pulmonary embolus. Prominence of the ascending thoracic aorta with maximum transverse diameter of 4.1 x 4.1 cm. Recommend annual imaging followup by CTA or MRA. This recommendation follows 2010 ACCF/AHA/AATS/ACR/ASA/SCA/SCAI/SIR/STS/SVM Guidelines for the Diagnosis and Management of Patients with Thoracic Aortic Disease. Circulation. 2010; 121: M353-I144 No thoracic aortic dissection. Multiple foci of coronary artery calcification noted. Stable small nodular opacities as noted above in the lung parenchyma. Bibasilar atelectasis. 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 No demonstrable adenopathy. Small hiatal hernia. Electronically Signed   By: Lowella Grip III M.D.   On: 09/18/2015 11:52     EKG: (Independently reviewed)  Sinus rhythm with ventricular rate 88 bpm, QTC 467 ms, right bundle branch block which is unchanged from previous, voltage criteria met for LVH.   Assessment & Plan  Principal Problem:   Atypical chest pain/known  nonobstructive CAD -Pain has been ongoing for almost 24 hours now in setting of normal troponin and nonischemic appearing EKG; therefore low index of suspicion this is actually cardiac in nature -Heart score equals 3 -Admit to telemetry/Obs -Echocardiogram and cycle troponin -Symptoms appear to be more consistent with GI etiology given onset after eating spicy food and worsened with laying supine and after eating (see below regarding additional workup) -EDP has consulted cardiology given patient's history of nonobstructive CAD -Continue preadmission baby aspirin and statin  Active Problems:   G E R D/ Erosive esophagitis (history of with prior dilatation 2014) -Symptoms appear more consistent with GI etiology -Having difficulty swallowing meats which is new -Patient has had progressive nausea over the past several weeks despite regular use of PPI -Continue daily PPI-may need to increase to twice a day but will defer to gastroenterology -GI cocktail 1 -Could have peptic ulcer disease or duodenal ulcer disease so we'll check serum H. pylori serologies -FOB -We'll consult gastroenterology -check hepatic function panel as well as lipase    Essential hypertension/ Mild concentric left ventricular hypertrophy   -Continue preadmission Cozaar -Due to concerns over constipation we'll hold thiazide diuretic for now -Follow up on echocardiogram    Constipation, slow transit -Chronic problem prior to surgery and has worsened since surgery and need for narcotic analgesia -Continue preadmission MiraLAX and Senokot and add Colace -I have ordered milk of magnesia prn -Check abdominal films to determine degree of likely constipation -Constipation could certainly be contributing to patient's presenting  symptomatology    Positive D dimer -D dimer 2.51 by CTA of chest negative for PE -Focal right lower extremity edema and operative leg-preliminary venous duplex negative for DVT or SVT    Dilatation  of thoracic aorta  -Incidental finding on CTA of chest -Radiologist recommends repeat imaging in 1 year    Mixed hyperlipidemia -Continue statin    Status post right knee replacement -PT evaluation while hospitalized -Continue pain medications    OSA  -Patient confirms still unable to tolerate CPAP    Hypothyroidism -Continue Synthroid -Check TSH    DVT Prophylaxis: Lovenox  Family Communication:  Wife and daughter at bedside  Code Status:  Full code  Condition:  Stable  Discharge disposition: Anticipate discharge back to previous home environment once medically stable-may need a new order for home health PE that was in place prior to this admission  Time spent in minutes : 60      Anjulie Dipierro L. ANP on 09/18/2015 at 1:09 PM  You may contact me by going to [email or web address] - password [email or web address]  I am available from 7a-7p but please confirm I am on the schedule by going to Amion as above.   After 7p please contact night coverage person covering me after hours  [facility]

## 2015-09-19 ENCOUNTER — Encounter (HOSPITAL_COMMUNITY): Payer: Self-pay

## 2015-09-19 ENCOUNTER — Encounter (HOSPITAL_COMMUNITY)
Admission: EM | Disposition: A | Discharge status: Home / Self Care | Payer: Self-pay | Source: Home / Self Care | Attending: Emergency Medicine

## 2015-09-19 DIAGNOSIS — K317 Polyp of stomach and duodenum: Secondary | ICD-10-CM

## 2015-09-19 DIAGNOSIS — R1314 Dysphagia, pharyngoesophageal phase: Secondary | ICD-10-CM | POA: Diagnosis not present

## 2015-09-19 DIAGNOSIS — I10 Essential (primary) hypertension: Secondary | ICD-10-CM | POA: Diagnosis not present

## 2015-09-19 DIAGNOSIS — K5900 Constipation, unspecified: Secondary | ICD-10-CM | POA: Diagnosis not present

## 2015-09-19 DIAGNOSIS — K222 Esophageal obstruction: Secondary | ICD-10-CM

## 2015-09-19 DIAGNOSIS — E782 Mixed hyperlipidemia: Secondary | ICD-10-CM | POA: Diagnosis not present

## 2015-09-19 DIAGNOSIS — K219 Gastro-esophageal reflux disease without esophagitis: Secondary | ICD-10-CM | POA: Diagnosis not present

## 2015-09-19 DIAGNOSIS — R0789 Other chest pain: Secondary | ICD-10-CM | POA: Diagnosis not present

## 2015-09-19 HISTORY — PX: ESOPHAGOGASTRODUODENOSCOPY: SHX5428

## 2015-09-19 HISTORY — PX: SAVORY DILATION: SHX5439

## 2015-09-19 LAB — OCCULT BLOOD X 1 CARD TO LAB, STOOL: FECAL OCCULT BLD: NEGATIVE

## 2015-09-19 LAB — H PYLORI, IGM, IGG, IGA AB
H Pylori IgG: <0.9 U/mL (ref 0.0–0.8)
H. Pylogi, Iga Abs: 33.4 units — ABNORMAL HIGH (ref 0.0–8.9)
H. Pylogi, Igm Abs: <9 units (ref 0.0–8.9)

## 2015-09-19 LAB — TROPONIN I: Troponin I: <0.03 ng/mL (ref <0.031)

## 2015-09-19 SURGERY — EGD (ESOPHAGOGASTRODUODENOSCOPY)
Anesthesia: Moderate Sedation

## 2015-09-19 MED ORDER — MIDAZOLAM HCL 5 MG/ML IJ SOLN
INTRAMUSCULAR | Status: AC
  Filled 2015-09-19: qty 2

## 2015-09-19 MED ORDER — OMEPRAZOLE 40 MG PO CPDR: 40.0000 mg | DELAYED_RELEASE_CAPSULE | Freq: Every day | ORAL | Status: DC

## 2015-09-19 MED ORDER — FENTANYL CITRATE (PF) 100 MCG/2ML IJ SOLN
INTRAMUSCULAR | Status: DC | PRN
  Administered 2015-09-19 (×3): 25 ug via INTRAVENOUS

## 2015-09-19 MED ORDER — BUTAMBEN-TETRACAINE-BENZOCAINE 2-2-14 % EX AERO
INHALATION_SPRAY | CUTANEOUS | Status: DC | PRN
  Administered 2015-09-19: 2 via TOPICAL

## 2015-09-19 MED ORDER — DIPHENHYDRAMINE HCL 50 MG/ML IJ SOLN
INTRAMUSCULAR | Status: AC
  Filled 2015-09-19: qty 1

## 2015-09-19 MED ORDER — FENTANYL CITRATE (PF) 100 MCG/2ML IJ SOLN
INTRAMUSCULAR | Status: AC
  Filled 2015-09-19: qty 4

## 2015-09-19 MED ORDER — ASPIRIN EC 81 MG PO TBEC
81.0000 mg | DELAYED_RELEASE_TABLET | Freq: Two times a day (BID) | ORAL | Status: DC
Start: 2015-10-03 — End: 2021-08-12

## 2015-09-19 MED ORDER — MIDAZOLAM HCL 10 MG/2ML IJ SOLN
INTRAMUSCULAR | Status: DC | PRN
  Administered 2015-09-19: 2 mg via INTRAVENOUS
  Administered 2015-09-19 (×2): 1 mg via INTRAVENOUS
  Administered 2015-09-19: 2 mg via INTRAVENOUS

## 2015-09-19 NOTE — Care Management Note (Signed)
Case Management Note Marvetta Gibbons RN, BSN Unit 2W-Case Manager [phone]  Patient Details  Name: JORI REGULA MRN: [id number] Date of Birth: 1945-08-21  Subjective/Objective:   Pt admitted with c/p                 Action/Plan: PTA pt lived at home with wife- was active with [facility] for HH-PT per wife- orders have been placed for HHPT/aide- call made to [facility] with [facility] and message left regarding pt's discharge home and resumption of services  Expected Discharge Date:  09/19/15              Expected Discharge Plan:  [place]  In-House Referral:     Discharge planning Services  CM Consult  Post Acute Care Choice:  Home Health, Resumption of Svcs/PTA Provider Choice offered to:  Spouse  DME Arranged:    DME Agency:     HH Arranged:  PT, Nurse's Aide HH Agency:  [place]  Status of Service:  Completed, signed off  Medicare Important Message Given:    Date Medicare IM Given:    Medicare IM give by:    Date Additional Medicare IM Given:    Additional Medicare Important Message give by:     If discussed at [place] of Stay Meetings, dates discussed:    Additional Comments:  Dawayne Patricia, RN 09/19/2015, 4:49 PM

## 2015-09-19 NOTE — Op Note (Signed)
[facility] Patient Name: [name] [name] Procedure Date : 09/19/2015 MRN: [id number] Attending MD: [name] , MD Date of Birth: 1945/08/15 CSN: [id number] Age: 70 Admit Type: Inpatient Procedure:                Upper GI endoscopy Indications:              Dysphagia, Gastro-esophageal reflux disease Providers:                Pricilla Riffle. Fuller Plan, MD, Kingsley Plan, RN, Ralene Bathe, Technician, Cletis Athens, Technician Referring MD:             [facility] Medicines:                Fentanyl 75 micrograms IV, Midazolam 6 mg IV,                            Cetacaine spray Complications:            No immediate complications. Estimated Blood Loss:     Estimated blood loss was minimal. Procedure:                Pre-Anesthesia Assessment:                           - Prior to the procedure, a History and Physical                            was performed, and patient medications and                            allergies were reviewed. The patient's tolerance of                            previous anesthesia was also reviewed. The risks                            and benefits of the procedure and the sedation                            options and risks were discussed with the patient.                            All questions were answered, and informed consent                            was obtained. Prior Anticoagulants: The patient has                            taken no previous anticoagulant or antiplatelet                            agents. ASA Grade Assessment: III - A patient with  severe systemic disease. After reviewing the risks                            and benefits, the patient was deemed in                            satisfactory condition to undergo the procedure.                           After obtaining informed consent, the endoscope was                            passed under direct vision. Throughout  the                            procedure, the patient's blood pressure, pulse, and                            oxygen saturations were monitored continuously. The                            [id number]) scope was introduced through the                            mouth, and advanced to the second part of duodenum.                            The upper GI endoscopy was accomplished without                            difficulty. The patient tolerated the procedure                            well. Scope In: Scope Out: Findings:      One moderate benign-appearing, intrinsic stenosis was found 40 cm from       the incisors at the EGJ. This measured 1.2 cm (inner diameter) and was       traversed. A guidewire was placed and the scope was withdrawn. Dilation       was performed with a Savary dilator with mild resistance to each dilator       at 12.8 mm, 14 mm and 15 mm. No heme noted on dilators. Estimated blood       loss was minimal.      The exam of the esophagus was otherwise normal.      A single 14 mm friable, pedunculated polyp with no bleeding and no       stigmata of recent bleeding was found in the gastric body. The polyp was       removed with a hot snare. Resection and retrieval were complete.       Estimated blood loss was minimal.      The cardia and gastric fundus were normal on retroflexion.      The exam of the stomach was otherwise normal.      The duodenal bulb and second portion of the duodenum were normal.  Estimated blood loss was minimal. Impression:               - Benign-appearing esophageal stenosis. Dilated.                           - A single gastric polyp. Resected and retrieved. Moderate Sedation:      Moderate (conscious) sedation was administered by the endoscopy nurse       and supervised by the endoscopist. The following parameters were       monitored: oxygen saturation, heart rate, blood pressure, respiratory       rate, EKG, adequacy of pulmonary  ventilation, and response to care.       Total physician intraservice time was 20 minutes. Recommendation:           - Return patient to hospital ward for ongoing care.                           - Clear liquids for 2 hours then soft diet until                            tomorrow then resume previous diet.                           - Continue present medications.                           - Omeprazole 40 mg po qam long term                           - Await pathology results.                           - No aspirin, ibuprofen, naproxen, or other                            non-steroidal anti-inflammatory drugs for 2 weeks                            after polyp removal.                           - Return to GI office with Dr. Carlean Purl in 2 months. Procedure Code(s):        --- Professional ---                           [phone], Esophagogastroduodenoscopy, flexible,                            transoral; with removal of tumor(s), polyp(s), or                            other lesion(s) by snare technique                           [id number], Esophagogastroduodenoscopy, flexible,  transoral; with insertion of guide wire followed by                            passage of dilator(s) through esophagus over guide                            wire                           G0500, Moderate sedation services provided by the                            same physician or other qualified health care                            professional performing a gastrointestinal                            endoscopic service that sedation supports,                            requiring the presence of an independent trained                            observer to assist in the monitoring of the                            patient's level of consciousness and physiological                            status; initial 15 minutes of intra-service time;                            patient age 20 years or older  (additional time may                            be reported with [phone], as appropriate) Diagnosis Code(s):        --- Professional ---                           K22.2, Esophageal obstruction                           K31.7, Polyp of stomach and duodenum                           R13.10, Dysphagia, unspecified                           K21.9, Gastro-esophageal reflux disease without                            esophagitis CPT copyright 2016 [employer] Medical Association. All rights reserved. The codes documented in this report are preliminary and upon coder review may  be revised to meet current compliance requirements. [name],  MD 09/19/2015 2:32:16 PM This report has been signed electronically. Number of Addenda: 0

## 2015-09-19 NOTE — Progress Notes (Signed)
Discharge education and appointments reviewed. No questions or concerns at this time.

## 2015-09-19 NOTE — Discharge Instructions (Signed)
Follow with Primary MD [name], MD in 7 days   Get CBC, CMP, 2 view Chest X Jamile checked  by Primary MD next visit.    Activity: As tolerated with Full fall precautions use walker/cane & assistance as needed   Disposition Home     Diet:   Heart Healthy with feeding assistance and aspiration precautions.  For Heart failure patients - Check your Weight same time everyday, if you gain over 2 pounds, or you develop in leg swelling, experience more shortness of breath or chest pain, call your Primary MD immediately. Follow Cardiac Low Salt Diet and 1.5 lit/day fluid restriction.   On your next visit with your primary care physician please Get Medicines reviewed and adjusted.   Please request your Prim.MD to go over all Hospital Tests and Procedure/Radiological results at the follow up, please get all Hospital records sent to your Prim MD by signing hospital release before you go home.   If you experience worsening of your admission symptoms, develop shortness of breath, life threatening emergency, suicidal or homicidal thoughts you must seek medical attention immediately by calling 911 or calling your MD immediately  if symptoms less severe.  You Must read complete instructions/literature along with all the possible adverse reactions/side effects for all the Medicines you take and that have been prescribed to you. Take any new Medicines after you have completely understood and accpet all the possible adverse reactions/side effects.   Do not drive, operating heavy machinery, perform activities at heights, swimming or participation in water activities or provide baby sitting services if your were admitted for syncope or siezures until you have seen by Primary MD or a Neurologist and advised to do so again.  Do not drive when taking Pain medications.    Do not take more than prescribed Pain, Sleep and Anxiety Medications  Special Instructions: If you have smoked or chewed Tobacco  in  the last 2 yrs please stop smoking, stop any regular Alcohol  and or any Recreational drug use.  Wear Seat belts while driving.   Please note  You were cared for by a hospitalist during your hospital stay. If you have any questions about your discharge medications or the care you received while you were in the hospital after you are discharged, you can call the unit and asked to speak with the hospitalist on call if the hospitalist that took care of you is not available. Once you are discharged, your primary care physician will handle any further medical issues. Please note that NO REFILLS for any discharge medications will be authorized once you are discharged, as it is imperative that you return to your primary care physician (or establish a relationship with a primary care physician if you do not have one) for your aftercare needs so that they can reassess your need for medications and monitor your lab values.

## 2015-09-19 NOTE — Evaluation (Signed)
Physical Therapy Evaluation Patient Details Name: [name] [name] MRN: [id number] DOB: 08-16-1945 Today's Date: 09/19/2015   History of Present Illness  70 y.o. male with multiple spinal surgeries since 2008 had TKA on R knee Feb 2010 and recently released from SNF, now home with debility to do daily mobility including self care.  Wife unable to physically assist him.  Clinical Impression  Pt is working on mobility and motivated to sit OOB but does not have the strength in UE's to help with better bed mob, as well as being stiff from previous lumbar surgery and less mobile with his R knee due to recent TKA.  He is noting slow recovery with impaired ability to get to his feet and perform self care.    Follow Up Recommendations Home health PT;Supervision - Intermittent    Equipment Recommendations  None recommended by PT    Recommendations for Other Services OT consult     Precautions / Restrictions Precautions Precautions: Fall;Back (telemetry) Precaution Booklet Issued: No Precaution Comments: previous surgery Restrictions Weight Bearing Restrictions: No      Mobility  Bed Mobility Overal bed mobility: Needs Assistance Bed Mobility: Supine to Sit     Supine to sit: Min guard     General bed mobility comments: struggling to roll and push up from his side, due to shoulder pain and uses elevated HOB and bedrail  Transfers Overall transfer level: Needs assistance Equipment used: Rolling walker (2 wheeled);1 person hand held assist Transfers: Sit to/from [facility] Sit to Stand: Min guard;Min assist Stand pivot transfers: Min guard       General transfer comment: pt is using hands to control descent  Ambulation/Gait Ambulation/Gait assistance: Min guard Ambulation Distance (Feet): 175 Feet Assistive device: Rolling walker (2 wheeled);1 person hand held assist Gait Pattern/deviations: Step-through pattern;Trunk flexed;Wide base of support;Antalgic Gait  velocity: reduced Gait velocity interpretation: Below normal speed for age/gender General Gait Details: gives to R knee during the walk, decreased time in RLE stance  Stairs            Wheelchair Mobility    Modified Rankin (Stroke Patients Only)       Balance Overall balance assessment: Modified Independent                                           Pertinent Vitals/Pain Pain Assessment: Faces Pain Score: 6  Faces Pain Scale: Hurts even more Pain Location: R knee and spine  Pain Descriptors / Indicators: Aching;Grimacing Pain Intervention(s): Monitored during session;Premedicated before session (instruction for positioning)    Home Living Family/patient expects to be discharged to:: Private residence   Available Help at Discharge: Family Type of Home: House Home Access: Stairs to enter Entrance Stairs-Rails: Chemical engineer of Steps: 10 Home Layout: Two level Home Equipment: Environmental consultant - 2 wheels;Walker - 4 wheels;Walker - standard;Cane - quad;Bedside commode;Tub bench;Adaptive equipment;Hand held shower head      Prior Function Level of Independence: Needs assistance   Gait / Transfers Assistance Needed: mod I on walker due to knee  ADL's / Homemaking Assistance Needed: wife assisting all housework        Hand Dominance        Extremity/Trunk Assessment                         Communication   Communication: No  difficulties  Cognition Arousal/Alertness: Awake/alert (but looks fatigued) Behavior During Therapy: WFL for tasks assessed/performed Overall Cognitive Status: Within Functional Limits for tasks assessed                      General Comments General comments (skin integrity, edema, etc.): Pt is managing with wife assisting at home but is not independent yet with ADL's including getting to feet adequately and tolerating rotation of trunk to perform BR hygiene    Exercises         Assessment/Plan    PT Assessment Patient needs continued PT services  PT Diagnosis Difficulty walking;Acute pain   PT Problem List Decreased strength;Decreased range of motion;Decreased activity tolerance;Decreased balance;Decreased mobility;Decreased coordination;Decreased knowledge of use of DME;Cardiopulmonary status limiting activity;Obesity;Decreased skin integrity;Pain  PT Treatment Interventions DME instruction;Gait training;Stair training;Functional mobility training;Therapeutic activities;Therapeutic exercise;Neuromuscular re-education;Balance training;Patient/family education   PT Goals (Current goals can be found in the Care Plan section) Acute Rehab PT Goals Patient Stated Goal: to get home and moving with less pain and sleep better PT Goal Formulation: With patient/family Time For Goal Achievement: 09/26/15 Potential to Achieve Goals: Good    Frequency Min 3X/week   Barriers to discharge Decreased caregiver support;Inaccessible home environment stairs all entrances, wife cannot lift or lean over well    Co-evaluation               End of Session Equipment Utilized During Treatment: Gait belt Activity Tolerance: Patient tolerated treatment well;Patient limited by pain Patient left: in chair;with call bell/phone within reach;with family/visitor present Nurse Communication: Mobility status    Functional Assessment Tool Used: clincial judgment Functional Limitation: Mobility: Walking and moving around Mobility: Walking and Moving Around Current Status [id number]): At least 20 percent but less than 40 percent impaired, limited or restricted Mobility: Walking and Moving Around Goal Status [phone]): At least 20 percent but less than 40 percent impaired, limited or restricted    Time: [id number] PT Time Calculation (min) (ACUTE ONLY): 31 min   Charges:   PT Evaluation $PT Eval Moderate Complexity: 1 Procedure PT Treatments $Gait Training: 8-22 mins   PT G Codes:    PT G-Codes **NOT FOR INPATIENT CLASS** Functional Assessment Tool Used: clincial judgment Functional Limitation: Mobility: Walking and moving around Mobility: Walking and Moving Around Current Status [id number]): At least 20 percent but less than 40 percent impaired, limited or restricted Mobility: Walking and Moving Around Goal Status [phone]): At least 20 percent but less than 40 percent impaired, limited or restricted    Ramond Dial 09/19/2015, 11:47 AM  Mee Hives, PT MS Acute Rehab Dept. Number: [facility] [id number] and [place] [phone]

## 2015-09-19 NOTE — Plan of Care (Signed)
Problem: Acute Rehab PT Goals(only PT should resolve) Goal: Pt Will Go Sit To Supine/Side With correct body mechanics

## 2015-09-19 NOTE — Progress Notes (Signed)
Patient Name: [name] [name] Date of Encounter: 09/19/2015   SUBJECTIVE  Feels ok. No chest pain or sob. For EGD today.   CURRENT MEDS . aspirin EC  81 mg Oral BID  . bisacodyl  10 mg Rectal Once  . citalopram  10 mg Oral QODAY  . darifenacin  7.5 mg Oral Daily  . docusate sodium  100 mg Oral BID  . levothyroxine  150 mcg Oral QAC breakfast  . losartan  50 mg Oral Daily  . pantoprazole  40 mg Oral BID  . polyethylene glycol  17 g Oral Daily  . senna-docusate  2 tablet Oral BID  . simvastatin  20 mg Oral q1800    OBJECTIVE  Filed Vitals:   09/18/15 1851 09/18/15 2020 09/19/15 0024 09/19/15 0448  BP: 141/54 142/70 113/65 112/67  Pulse: 83 90 79 76  Temp: 99.2 F (37.3 C) 99.3 F (37.4 C) 99.7 F (37.6 C) 98.3 F (36.8 C)  TempSrc: Oral Oral Oral Oral  Resp: 18 18 18 18   Height:      Weight:      SpO2: 98% 95% 95% 95%   No intake or output data in the 24 hours ending 09/19/15 0909 Filed Weights   09/18/15 0902  Weight: 270 lb (122.471 kg)    PHYSICAL EXAM  General: Pleasant, NAD. Neuro: Alert and oriented X 3. Moves all extremities spontaneously. Psych: Normal affect. HEENT:  Normal  Neck: Supple without bruit.  Lungs:  Resp regular and unlabored, CTA. Heart: RRR no s3, s4, or murmurs. Abdomen: Soft, non-tender, non-distended, BS + x 4.  Extremities: No clubbing, cyanosis or edema. DP/PT/Radials 2+ and equal bilaterally. L knee surgical scar with TTP.   Accessory Clinical Findings  CBC  Recent Labs  09/18/15 0851  WBC 10.4  NEUTROABS 8.1*  HGB 14.0  HCT 42.3  MCV 94.4  PLT A999333   Basic Metabolic Panel  Recent Labs  09/18/15 0851  NA 136  K 4.0  CL 103  CO2 23  GLUCOSE 146*  BUN 19  CREATININE 0.82  CALCIUM 9.3   Liver Function Tests  Recent Labs  09/18/15 1302  AST 19  ALT 14*  ALKPHOS 59  BILITOT 0.7  PROT 6.6  ALBUMIN 3.3*    Recent Labs  09/18/15 1550  LIPASE 33   Cardiac Enzymes  Recent Labs  09/18/15 1550  09/18/15 2111 09/19/15 0232  TROPONINI <0.03 <0.03 <0.03   BNP Invalid input(s): POCBNP D-Dimer  Recent Labs  09/18/15 0851  DDIMER 2.51*  Thyroid Function Tests  Recent Labs  09/18/15 1550  TSH 1.764    TELE  Sinus rhythm with PVCs  Radiology/Studies  Dg Chest 2 View  09/18/2015  CLINICAL DATA:  Left side chest pain starting this morning EXAM: CHEST  2 VIEW COMPARISON:  07/29/2015 FINDINGS: Borderline cardiomegaly. Study is limited by poor inspiration. Bilateral basilar atelectasis. No gross infiltrate or pulmonary edema. Mild degenerative changes lower thoracic spine. IMPRESSION: Limited study by poor inspiration with bilateral basilar atelectasis. No gross infiltrate or pulmonary edema. Degenerative changes lower thoracic spine. Electronically Signed   By: Lahoma Crocker M.D.   On: 09/18/2015 09:19   Ct Angio Chest Pe W/cm &/or Wo Cm  09/18/2015  CLINICAL DATA:  Shortness of breath and left-sided chest pain EXAM: CT ANGIOGRAPHY CHEST WITH CONTRAST TECHNIQUE: Multidetector CT imaging of the chest was performed using the standard protocol during bolus administration of intravenous contrast. Multiplanar CT image reconstructions and MIPs were obtained  to evaluate the vascular anatomy. CONTRAST:  129mL OMNIPAQUE IOHEXOL 350 MG/ML SOLN COMPARISON:  Chest CT December 11, 2013 ; chest radiograph September 18, 2015 FINDINGS: Mediastinum/Lymph Nodes: There is no demonstrable pulmonary embolus. There is prominence of the ascending thoracic aorta with a maximum transverse diameter of 4.1 x 4.1 cm. There is no thoracic aortic dissection. The visualized great vessels appear unremarkable. There are multiple foci of coronary artery calcification. There is left ventricular hypertrophy. The pericardium is not appreciably thickened. Thyroid appears unremarkable. There is no appreciable thoracic adenopathy. There is a small hiatal hernia. Lungs/Pleura: On axial slice 47 series A999333, there is a stable 4 mm nodular  opacity in the anterior segment right upper lobe. On axial slice 59 series A999333, there is a stable 3 mm nodular opacity in the posterior segment of the right upper lobe, stable. There is a 4 mm stable nodular opacity in the anterior segment of the right lower lobe seen on axial slice 67 series [id number]. There is patchy bibasilar atelectatic change. Other nodular opacities seen on prior study are not evident currently. There is no edema or consolidation. There are occasional foci of parietal pleural thickening, also present on prior study. Upper abdomen: In the visualized upper abdomen, no lesions are apparent beyond atherosclerotic calcification in the aorta. Musculoskeletal: There is degenerative change in the thoracic spine. There are no blastic or lytic bone lesions. There is postoperative change in the left shoulder region. Review of the MIP images confirms the above findings. IMPRESSION: No demonstrable pulmonary embolus. Prominence of the ascending thoracic aorta with maximum transverse diameter of 4.1 x 4.1 cm. Recommend annual imaging followup by CTA or MRA. This recommendation follows 2010 ACCF/AHA/AATS/ACR/ASA/SCA/SCAI/SIR/STS/SVM Guidelines for the Diagnosis and Management of Patients with Thoracic Aortic Disease. Circulation. 2010; 121: [id number] No thoracic aortic dissection. Multiple foci of coronary artery calcification noted. Stable small nodular opacities as noted above in the lung parenchyma. Bibasilar atelectasis. 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 No demonstrable adenopathy. Small hiatal hernia. Electronically Signed   By: Lowella Grip III M.D.   On: 09/18/2015 11:52   Dg Abd 2 Views  09/18/2015  CLINICAL DATA:  Abdominal pain EXAM: ABDOMEN - 2 VIEW COMPARISON:  02/23/2011 FINDINGS: There is no free intraperitoneal gas on the upright view. Extensive stool burden throughout the colon is present. There is generalized  distention of small and large bowel. There is contrast in the bladder and renal collecting system. Lumbar stabilization hardware is stable. IMPRESSION: Nonobstructive bowel gas pattern. Prominent stool burden. No evidence of free intraperitoneal gas. Electronically Signed   By: Marybelle Killings M.D.   On: 09/18/2015 14:02    ASSESSMENT AND PLAN  1. Atypical chest pain - Likely GI related. Given hx of dysphagia plan for EGD today. Troponin negative x 3. EKG without acute changes. No work up needed. He will be low to moderate risk for cardiac complication for surgery.   2. CAD - MIld/nonobstructive disease by cath in 2010 (mid LAD 30% and proximal RCA 30% ). No anginal pain.  Myoview prior to his cardiac catheterization 07/2008 demonstrated inferior scar with mild to moderate peri-infarct ischemia.  Current symptoms do not appear to be due to ischemia.    3. Abnormal chest CT - CT scan did show mild dilation of aorta (41 mm).  Some coronary calcifications .WIll need to be followed long term Continue statin.  4. HL - No results found for requested labs within last 365  days. Followed labs by PCP. Continue Zocor 20mg .   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 Call with questions.   Signed, Bhagat,Bhavinkumar PA-C Pager [phone]  Pt seen and examined  I agree with findings as noted by B Bhagat above   R/O for MI  Symptoms do not sound cardiac in origin Lungs are CTA  Cardiac RRR  Ext No edema Will sign off.   Will make sure he has outpt f/u.    [name]

## 2015-09-19 NOTE — Discharge Summary (Signed)
[name] [name], is a 70 y.o. male  DOB 08-Nov-1945  MRN [id number].  Admission date:  09/18/2015  Admitting Physician  Albertine Patricia, MD  Discharge Date:  09/19/2015   Primary MD  [name], MD  Recommendations for primary care physician for things to follow:   Patient needs repeat CT angiogram of the chest for aortic aneurysm in 6 months or less, kindly review CT findings carefully.  Outpatient cardiology and GI follow-up   Admission Diagnosis  Atypical chest pain [R07.89] Constipation [K59.00]   Discharge Diagnosis  Atypical chest pain [R07.89] Constipation [K59.00]     Principal Problem:   Atypical chest pain Active Problems:   Mixed hyperlipidemia   Essential hypertension   CAD, NATIVE VESSEL   G E R D   Constipation, slow transit   Status post right knee replacement   Erosive esophagitis (history of with prior dilatation 2014)   OSA (obstructive sleep apnea)   Positive D dimer   Mild concentric left ventricular hypertrophy (LVH)   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HCC)   Hypothyroidism      Past Medical History  Diagnosis Date  . Observed sleep apnea     can't wear cpap  . HTN (hypertension)   . GERD (gastroesophageal reflux disease)   . Dyspnea     -PFTs compeltely normal 03/20/08 including DLc0  . Sleep apnea     Most recent sleep study 2010; records at [facility] office  . CAD (coronary artery disease)     cath 3/10: mLAD 30%, pRCA 30%, EF 55-60% Dr. Harrington Challenger cardiologist  most recent  stress stress done ~ 2 years ago with Dr. Harrington Challenger  . Hypothyroidism   . Low testosterone   . Arthritis   . Depression   . Polycythemia   . Pulmonary nodule 06/13/2012  . Rosacea   . PONV (postoperative nausea and vomiting)   . Complication of anesthesia     "he usually gets an ileus after back OR"    . Environmental allergies     Dust, Smoke    Past Surgical History  Procedure Laterality Date  . Cataract extraction, bilateral  2010  . Meniscus repair  08/2008    left  . Tonsillectomy  09/21/11  . Uvulopalatopharyngoplasty, tonsillectomy and septoplasty  09/21/11    Deviated Septum  . Posterior fusion lumbar spine  2005; 01/2007; 04/2009    L5; L3-4; L2-3  . Lumbar disc surgery  06/2006    L3  . Back surgery  2005; 06/2006; 01/2007; 04/2009  . Prostate surgery  2000  . Transurethral resection of prostate  2006    followed by "surgery to get rid of clots"  . Shoulder open rotator cuff repair Left 1999    left  . Cervical fusion  04/2002    ?C3-4  . 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 Cardiac catheterization  08/2008  . Esophagogastroduodenoscopy  multiple    last 07/2012 GERD esophagitis, 48 Fr dilation  . Colonoscopy  12/2005  normal screening study.   . Knee arthroscopy with medial menisectomy Right 05/14/2015    Procedure: RIGHT KNEE ARTHROSCOPY CHONDROPLASTY, PARTIAL  MEDIAL MENISECTOMY;  Surgeon: [name], MD;  Location: [facility];  Service: Orthopedics;  Laterality: Right;  . Total knee arthroplasty Right 08/08/2015    Procedure: RIGHT TOTAL KNEE ARTHROPLASTY;  Surgeon: [name], MD;  Location: [facility];  Service: Orthopedics;  Laterality: Right;       HPI  from the history and physical done on the day of admission:    70 year old male patient with known sleep apnea intolerant to CPAP, hypertension, obesity, dyslipidemia, GERD with prior erosive esophagitis status post esophageal dilatation secondary stricture, recent right total knee replacement discharged from rehabilitation therapy, chronic constipation. Patient has a history of nonobstructive CAD based on cardiac catheterization in 2010 and has been on medical therapy. Patient reports that yesterday he ate chili with beans for lunch and laid down to take a nap. After awakening from his  nap he began having lower chest / upper left abdominal pain that waxed and waned and became more severe as the afternoon wore on. His only other symptom was of mild nausea. No shortness of breath, no diaphoresis. He did report a pleuritic component with noted left upper quadrant pain with trying to breathe. Symptoms had continued to wax and wane but the patient did notice that after going to bed the pain was worse with laying down/when supine. He apparently did not take any medications to treat the symptoms although he typically takes Prilosec once a day.   He initially told the EDP he was having chest pain and pointed to complaining of pain underneath the left rib cage and someone in the left lateral and mid abdomen. He has noticed issues recently with difficulty swallowing meats. In addition he's been complaining of dizzy spells since his surgical procedure and intermittent nausea. He reports he has issues with chronic constipation just been worse since his surgery noting he is on narcotics postoperatively. He reports that sometimes he only has a bowel movement once every 6 days. He has not noticed any blood in his stools or any dark stools. He reports his abdomen does feel somewhat bloated and distended today.      Hospital Course:    1.Atypical chest pain in a patient with known history of nonobstructive CAD. He ruled out for MI, EKG nonacute, CT angiogram nonacute, patient was seen by cardiology no further workup. Outpatient follow-up with them. His chest pain was likely musculoskeletal. Currently pain-free.  2. Incidental finding of thoracic aortic aneurysm on CT scan. Outpatient follow-up with PCP, will require repeat CT scan within 6-12 months kindly see port for all details.  3. History of esophageal stenosis, status post dilation in the past. Reflux. Seen by GI underwent EGD which was unremarkable, per GI hold aspirin for 2 weeks, continue PPI and outpatient GI follow-up.  4. Positive  d-dimer. Nonspecific. CT angiogram negative for PE, lower extremity venous duplex unremarkable as well.  5. Recent right knee surgery. Stable.  6. Hypothyroidism. Continue home dose Synthroid.   Discharge Condition: Stable  Follow UP  Follow-up Information    Follow up with [name], MD. Schedule an appointment as soon as possible for a visit in 1 week.   Specialty:  Family Medicine   Contact information:   [address] [place] [address] [phone]       Follow up with Pricilla Riffle. Fuller Plan, MD. Schedule an  appointment as soon as possible for a visit in 1 week.   Specialty:  Gastroenterology   Contact information:   [address] [place] [address] [phone]       Follow up with [name], MD. Schedule an appointment as soon as possible for a visit in 1 week.   Specialty:  Cardiology   Contact information:   [place] [address] [phone]       Follow up with Silvano Rusk, MD On 11/17/2015.   Specialty:  Gastroenterology   Why:  1:45 PM for GI follow up.     Contact information:   [address] [place] [address] [phone]        Consults obtained - Cards, GI  Diet and Activity recommendation: See Discharge Instructions below  Discharge Instructions           Discharge Instructions    Diet - low sodium heart healthy    Complete by:  As directed      Discharge instructions    Complete by:  As directed   Follow with Primary MD [name], MD in 7 days   Get CBC, CMP, 2 view Chest X [place] checked  by Primary MD next visit.    Activity: As tolerated with Full fall precautions use walker/cane & assistance as needed   Disposition Home     Diet:   Heart Healthy with feeding assistance and aspiration precautions.  For Heart failure patients - Check your Weight same time everyday, if you gain over 2 pounds, or you develop in leg swelling, experience more shortness of  breath or chest pain, call your Primary MD immediately. Follow Cardiac Low Salt Diet and 1.5 lit/day fluid restriction.   On your next visit with your primary care physician please Get Medicines reviewed and adjusted.   Please request your Prim.MD to go over all Hospital Tests and Procedure/Radiological results at the follow up, please get all Hospital records sent to your Prim MD by signing hospital release before you go home.   If you experience worsening of your admission symptoms, develop shortness of breath, life threatening emergency, suicidal or homicidal thoughts you must seek medical attention immediately by calling 911 or calling your MD immediately  if symptoms less severe.  You Must read complete instructions/literature along with all the possible adverse reactions/side effects for all the Medicines you take and that have been prescribed to you. Take any new Medicines after you have completely understood and accpet all the possible adverse reactions/side effects.   Do not drive, operating heavy machinery, perform activities at heights, swimming or participation in water activities or provide baby sitting services if your were admitted for syncope or siezures until you have seen by Primary MD or a Neurologist and advised to do so again.  Do not drive when taking Pain medications.    Do not take more than prescribed Pain, Sleep and Anxiety Medications  Special Instructions: If you have smoked or chewed Tobacco  in the last 2 yrs please stop smoking, stop any regular Alcohol  and or any Recreational drug use.  Wear Seat belts while driving.   Please note  You were cared for by a hospitalist during your hospital stay. If you have any questions about your discharge medications or the care you received while you were in the hospital after you are discharged, you can call the unit and asked to speak with the hospitalist on call if the  hospitalist that took care of you is not  available. Once you are discharged, your primary care physician will handle any further medical issues. Please note that NO REFILLS for any discharge medications will be authorized once you are discharged, as it is imperative that you return to your primary care physician (or establish a relationship with a primary care physician if you do not have one) for your aftercare needs so that they can reassess your need for medications and monitor your lab values.     Increase activity slowly    Complete by:  As directed              Discharge Medications       Medication List    TAKE these medications        aspirin EC 81 MG tablet  Take 1 tablet (81 mg total) by mouth 2 (two) times daily.  Start taking on:  10/03/2015     citalopram 20 MG tablet  Commonly known as:  CELEXA  Take 10 mg by mouth every other day.     hydrochlorothiazide 25 MG tablet  Commonly known as:  HYDRODIURIL  Take 25 mg by mouth daily.     levothyroxine 150 MCG tablet  Commonly known as:  SYNTHROID, LEVOTHROID  Take 150 mcg by mouth daily before breakfast.     losartan 50 MG tablet  Commonly known as:  COZAAR  Take 50 mg by mouth daily.     MIRALAX packet  Generic drug:  polyethylene glycol  Take 17 g by mouth daily.     omeprazole 40 MG capsule  Commonly known as:  PRILOSEC  Take 1 capsule (40 mg total) by mouth daily before breakfast. 30 minutes before     oxyCODONE-acetaminophen 5-325 MG tablet  Commonly known as:  ROXICET  Take one to two tablets by mouth every 4 hours as needed for severe pain. Do not exceed 4gm of Tylenol in 24 hours     sennosides-docusate sodium 8.6-50 MG tablet  Commonly known as:  SENOKOT-S  Take 1 tablet by mouth 2 (two) times daily.     simvastatin 20 MG tablet  Commonly known as:  ZOCOR  Take 1 tablet (20 mg total) by mouth daily at 6 PM.     triamcinolone cream 0.1 %  Commonly known as:  KENALOG  Apply 1 application topically 2 (two) times daily. Reported on  08/26/2015     Trospium Chloride 60 MG Cp24  Take 60 mg by mouth daily.        Major procedures and Radiology Reports - PLEASE review detailed and final reports for all details, in brief -     Leg [name] - No evidence of deep vein or superficial thrombosis involving the right lower extremity and left common femoral vein.  EGD - Non Acute  Dg Chest 2 View  09/18/2015  CLINICAL DATA:  Left side chest pain starting this morning EXAM: CHEST  2 VIEW COMPARISON:  07/29/2015 FINDINGS: Borderline cardiomegaly. Study is limited by poor inspiration. Bilateral basilar atelectasis. No gross infiltrate or pulmonary edema. Mild degenerative changes lower thoracic spine. IMPRESSION: Limited study by poor inspiration with bilateral basilar atelectasis. No gross infiltrate or pulmonary edema. Degenerative changes lower thoracic spine. Electronically Signed   By: Lahoma Crocker M.D.   On: 09/18/2015 09:19   Ct Angio Chest Pe W/cm &/or Wo Cm  09/18/2015  CLINICAL DATA:  Shortness of breath and left-sided chest pain EXAM: CT ANGIOGRAPHY CHEST WITH CONTRAST  TECHNIQUE: Multidetector CT imaging of the chest was performed using the standard protocol during bolus administration of intravenous contrast. Multiplanar CT image reconstructions and MIPs were obtained to evaluate the vascular anatomy. CONTRAST:  155mL OMNIPAQUE IOHEXOL 350 MG/ML SOLN COMPARISON:  Chest CT December 11, 2013 ; chest radiograph September 18, 2015 FINDINGS: Mediastinum/Lymph Nodes: There is no demonstrable pulmonary embolus. There is prominence of the ascending thoracic aorta with a maximum transverse diameter of 4.1 x 4.1 cm. There is no thoracic aortic dissection. The visualized great vessels appear unremarkable. There are multiple foci of coronary artery calcification. There is left ventricular hypertrophy. The pericardium is not appreciably thickened. Thyroid appears unremarkable. There is no appreciable thoracic adenopathy. There is a small hiatal hernia.  Lungs/Pleura: On axial slice 47 series A999333, there is a stable 4 mm nodular opacity in the anterior segment right upper lobe. On axial slice 59 series A999333, there is a stable 3 mm nodular opacity in the posterior segment of the right upper lobe, stable. There is a 4 mm stable nodular opacity in the anterior segment of the right lower lobe seen on axial slice 67 series [id number]. There is patchy bibasilar atelectatic change. Other nodular opacities seen on prior study are not evident currently. There is no edema or consolidation. There are occasional foci of parietal pleural thickening, also present on prior study. Upper abdomen: In the visualized upper abdomen, no lesions are apparent beyond atherosclerotic calcification in the aorta. Musculoskeletal: There is degenerative change in the thoracic spine. There are no blastic or lytic bone lesions. There is postoperative change in the left shoulder region. Review of the MIP images confirms the above findings. IMPRESSION: No demonstrable pulmonary embolus. Prominence of the ascending thoracic aorta with maximum transverse diameter of 4.1 x 4.1 cm. Recommend annual imaging followup by CTA or MRA. This recommendation follows 2010 ACCF/AHA/AATS/ACR/ASA/SCA/SCAI/SIR/STS/SVM Guidelines for the Diagnosis and Management of Patients with Thoracic Aortic Disease. Circulation. 2010; 121: [id number] No thoracic aortic dissection. Multiple foci of coronary artery calcification noted. Stable small nodular opacities as noted above in the lung parenchyma. Bibasilar atelectasis. 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 No demonstrable adenopathy. Small hiatal hernia. Electronically Signed   By: Lowella Grip III M.D.   On: 09/18/2015 11:52   Dg Abd 2 Views  09/18/2015  CLINICAL DATA:  Abdominal pain EXAM: ABDOMEN - 2 VIEW COMPARISON:  02/23/2011 FINDINGS: There is no free intraperitoneal gas on the upright view. Extensive  stool burden throughout the colon is present. There is generalized distention of small and large bowel. There is contrast in the bladder and renal collecting system. Lumbar stabilization hardware is stable. IMPRESSION: Nonobstructive bowel gas pattern. Prominent stool burden. No evidence of free intraperitoneal gas. Electronically Signed   By: Marybelle Killings M.D.   On: 09/18/2015 14:02    Micro Results      No results found for this or any previous visit (from the past 240 hour(s)).     Today   Subjective    [name] [name] today has no headache,no chest abdominal pain,no new weakness tingling or numbness, feels much better wants to go home today.     Objective   Blood pressure 131/44, pulse 69, temperature 98.5 F (36.9 C), temperature source Oral, resp. rate 14, height 5\' 10"  (1.778 m), weight 122.471 kg (270 lb), SpO2 95 %.   Intake/Output Summary (Last 24 hours) at 09/19/15 1442 Last data filed at 09/19/15 1400  Gross per 24 hour  Intake 280.67 ml  Output      0 ml  Net 280.67 ml    Exam Awake Alert, Oriented x 3, No new F.N deficits, Normal affect [place].AT,PERRAL Supple Neck,No JVD, No cervical lymphadenopathy appriciated.  Symmetrical Chest wall movement, Good air movement bilaterally, CTAB RRR,No Gallops,Rubs or new Murmurs, No Parasternal Heave +ve B.Sounds, Abd Soft, Non tender, No organomegaly appriciated, No rebound -guarding or rigidity. No Cyanosis, Clubbing or edema, No new Rash or bruise   Data Review   CBC w Diff:  Lab Results  Component Value Date   WBC 10.4 09/18/2015   WBC 7.3 08/14/2015   WBC 5.6 12/11/2013   HGB 14.0 09/18/2015   HGB 14.8 12/11/2013   HCT 42.3 09/18/2015   HCT 45.2 12/11/2013   PLT 207 09/18/2015   PLT 231 12/11/2013   LYMPHOPCT 12 09/18/2015   LYMPHOPCT 22.5 12/11/2013   MONOPCT 10 09/18/2015   MONOPCT 9.8 12/11/2013   EOSPCT 1 09/18/2015   EOSPCT 2.3 12/11/2013   BASOPCT 0 09/18/2015   BASOPCT 0.5 12/11/2013    CMP:    Lab Results  Component Value Date   NA 136 09/18/2015   NA 137 08/14/2015   NA 136 12/11/2013   K 4.0 09/18/2015   K 4.0 12/11/2013   CL 103 09/18/2015   CL 102 12/12/2012   CO2 23 09/18/2015   CO2 25 12/11/2013   BUN 19 09/18/2015   BUN 13 08/14/2015   BUN 14.3 12/11/2013   CREATININE 0.82 09/18/2015   CREATININE 0.8 08/14/2015   CREATININE 0.9 12/11/2013   GLU 89 08/14/2015   PROT 6.6 09/18/2015   PROT 8.0 12/11/2013   ALBUMIN 3.3* 09/18/2015   ALBUMIN 3.8 12/11/2013   BILITOT 0.7 09/18/2015   BILITOT 0.77 12/11/2013   ALKPHOS 59 09/18/2015   ALKPHOS 66 12/11/2013   AST 19 09/18/2015   AST 27 12/11/2013   ALT 14* 09/18/2015   ALT 32 12/11/2013  .   Total Time in preparing paper work, data evaluation and todays exam - 35 minutes  Thurnell Lose M.D on 09/19/2015 at 2:42 PM  [facility]   Office  [phone]

## 2015-09-19 NOTE — H&P (View-Only) (Signed)
[facility] Consult: 1:48 PM 09/18/2015     Referring Provider: Dr [name]  Primary Care Physician:  [name], MD Primary Gastroenterologist:  Dr. Carlean Purl   Reason for Consultation:  Dysphagia, and chest pain   HPI: [name] [name] is a 70 y.o. male.  Hx htn, GERD, non-obstructive CAD on cath in 2010.  Obesity.  OSA, does not tolerate bipap.  Hx post op ileus after spine surgery.  S/p 04/2015 right knee arthroscopic meniscus repair. S/p 08/08/15 right TKA  Hx esophageal stricture, dilations.   07/2012 EGD: for dysphagia.  Esophagitis.  Maloney dilatation but no stricture seen.  Path: benign, reacitve, mild inflammation 72007 Colonoscopy: routine screening.  Normal.    1996 EGD:  For dysphagia.  Distal, benign esophageal stricture, dilated.  HH.   Since knee surgery has been taking 6 percocet per day for knee pain.  His chronic constipation is worse and his normal dilay Miralax not always effective as it had been before surgery.   Ate chili and burger at lunch, per his routine he laid down for nap and woke up a few hours later with 10/10 pain in left lower chest, below breast and some in the lower sternum and upper left abdomen.  No dyspnea, cough, n/v, sweat, palpitations, sob. Pain lasted through present.  Came to ED.  No relief with sl ntg but some relief with GI cocktail.  No recent activity which might have strained his side or chest.  LFTs normal.  Lipase is pending.  First in series Troponin is normal.  D dimer up to 2.5.  Non-ischemic EKG.   CT angio of chest: prominent thoracic aorta: 4.1 x 4.1 CM.  No PE.  Stable nodular opacities.  Small HH.  Unremarkable upper abdomen. 2V abdomen:  Increased stool burden.   LE Dopplers negative for DVT.   Echo ordered.  Cardiology unimpressed.    Past Medical  History  Diagnosis Date  . Observed sleep apnea     can't wear cpap  . HTN (hypertension)   . GERD (gastroesophageal reflux disease)   . Dyspnea     -PFTs compeltely normal 03/20/08 including DLc0  . Sleep apnea     Most recent sleep study 2010; records at [facility] office  . CAD (coronary artery disease)     cath 3/10: mLAD 30%, pRCA 30%, EF 55-60% Dr. Harrington Challenger cardiologist  most recent  stress stress done ~ 2 years ago with Dr. Harrington Challenger  . Hypothyroidism   . Low testosterone   . Arthritis   . Depression   . Polycythemia   . Pulmonary nodule 06/13/2012  . Rosacea   . PONV (postoperative nausea and vomiting)   . Complication of anesthesia     "he usually gets an ileus after back OR"   . Environmental allergies     Dust, Smoke    Past Surgical History  Procedure Laterality Date  . Cataract extraction, bilateral  2010  . Meniscus repair  08/2008    left  . Tonsillectomy  09/21/11  . Uvulopalatopharyngoplasty, tonsillectomy and septoplasty  09/21/11    Deviated Septum  . Posterior fusion lumbar spine  2005; 01/2007; 04/2009    L5; L3-4; L2-3  . Lumbar disc surgery  06/2006    L3  . Back surgery  2005; 06/2006; 01/2007; 04/2009  . Prostate surgery  2000  . Transurethral resection of prostate  2006    followed by "surgery to get rid of clots"  . Shoulder open rotator cuff repair Left 1999    left  . Cervical fusion  04/2002    ?C3-4  . 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 Cardiac catheterization  08/2008  . Esophagogastroduodenoscopy  multiple    last 07/2012 GERD esophagitis, 48 Fr dilation  . Colonoscopy    . Knee arthroscopy with medial menisectomy Right 05/14/2015    Procedure: RIGHT KNEE ARTHROSCOPY CHONDROPLASTY, PARTIAL  MEDIAL MENISECTOMY;  Surgeon: [name], MD;  Location: [place];  Service: Orthopedics;  Laterality: Right;  . Total knee arthroplasty Right 08/08/2015    Procedure: RIGHT TOTAL KNEE ARTHROPLASTY;  Surgeon: [name], MD;   Location: [place];  Service: Orthopedics;  Laterality: Right;    Prior to Admission medications   Medication Sig Start Date End Date Taking? Authorizing Provider  aspirin EC 81 MG tablet Take 81 mg by mouth 2 (two) times daily.   Yes Historical Provider, MD  citalopram (CELEXA) 20 MG tablet Take 10 mg by mouth every other day.    Yes Historical Provider, MD  hydrochlorothiazide (HYDRODIURIL) 25 MG tablet Take 25 mg by mouth daily.    Yes Historical Provider, MD  levothyroxine (SYNTHROID, LEVOTHROID) 150 MCG tablet Take 150 mcg by mouth daily before breakfast.   Yes Historical Provider, MD  losartan (COZAAR) 50 MG tablet Take 50 mg by mouth daily.   Yes Historical Provider, MD  omeprazole (PRILOSEC) 40 MG capsule Take 1 capsule (40 mg total) by mouth daily before breakfast. 30 minutes before 08/18/12  Yes Gatha Mayer, MD  oxyCODONE-acetaminophen (ROXICET) 5-325 MG tablet Take one to two tablets by mouth every 4 hours as needed for severe pain. Do not exceed 4gm of Tylenol in 24 hours Patient taking differently: Take 2 tablets by mouth every 4 (four) hours as needed for moderate pain. Take one to two tablets by mouth every 4 hours as needed for severe pain. Do not exceed 4gm of Tylenol in 24 hours 08/19/15  Yes Lauree Chandler, NP  polyethylene glycol [facility]) packet Take 17 g by mouth daily.     Yes Historical Provider, MD  simvastatin (ZOCOR) 20 MG tablet Take 1 tablet (20 mg total) by mouth daily at 6 PM. 05/12/15  Yes Burtis Junes, NP  triamcinolone cream (KENALOG) 0.1 % Apply 1 application topically 2 (two) times daily. Reported on 08/26/2015   Yes Historical Provider, MD  Trospium Chloride 60 MG CP24 Take 60 mg by mouth daily.   Yes Historical Provider, MD  sennosides-docusate sodium (SENOKOT-S) 8.6-50 MG tablet Take 1 tablet by mouth 2 (two) times daily.    Historical Provider, MD    Scheduled Meds:   Infusions: . sodium chloride     PRN Meds: nitroGLYCERIN   Allergies as of  09/18/2015 - Review Complete 09/18/2015  Allergen Reaction Noted  . Penicillins Hives and Other (See Comments) 07/14/2007  . Codeine Nausea And Vomiting 07/14/2007  . Sulfonamide derivatives Nausea And Vomiting 07/14/2007    Family History  Problem Relation Age of Onset  . Anesthesia problems Neg Hx   .  Breast cancer Mother   . Colon polyps Mother   . Diabetes Mother   . Breast cancer Sister   . Diabetes Sister     borderline  . Heart Problems Sister   . Heart attack Father   . Alzheimer's disease Father   . Lung cancer Paternal Uncle     x4  . Throat cancer Maternal Uncle   . Liver cancer Maternal Uncle     Social History   Social History  . Marital Status: Married    Spouse Name: N/A  . Number of Children: 3  . Years of Education: N/A   Occupational History  . retired    Social History Main Topics  . Smoking status: Former Smoker -- 1.50 packs/day for 18 years    Types: Cigarettes    Quit date: 06/28/1974  . Smokeless tobacco: Never Used  . Alcohol Use: No  . Drug Use: No  . Sexual Activity: No   Other Topics Concern  . Not on file   Social History Narrative   Area, 3 children.   Retired from the city of [place]   5 caffeinated beverages daily    REVIEW OF SYSTEMS: Constitutional:  Weight loss of more than 10 # snce 07/2015 knee surgery.  No fatigue ENT:  No nose bleeds Pulm:  No dyspnea but inspiration increases the left chest pain.  CV:  No palpitations, no LE edema. Per HPI GU:  No hematuria, no frequency GI:  Per HPI Heme:  No unusual bleeding or excessive bruisng   Transfusions:  None  Neuro:  No headaches, no peripheral tingling or numbness Derm:  No itching, no rash or sores.  Endocrine:  No sweats or chills.  No polyuria or dysuria Immunization:  Not queried.  Travel:  None beyond local counties in last few months.    PHYSICAL EXAM: Vital signs in last 24 hours: Filed Vitals:   09/18/15 1230 09/18/15 1315  BP: 108/63 128/82    Pulse: 77 78  Resp: 15 16   Wt Readings from Last 3 Encounters:  09/18/15 122.471 kg (270 lb)  08/26/15 126.916 kg (279 lb 12.8 oz)  08/19/15 127.914 kg (282 lb)    General: obese, comfortable, alert.  Not ill looking Head:  No trauma, swelling or asymmetry  Eyes:  No icterus or pallor Ears:  + HOH  Nose:  No discharge.   Mouth:  Dentures in place.  Moist and clear oral MM Neck:  No mass or JVD.   Lungs:  Clear bil.   Heart: RRR Abdomen:  Soft, obese, NT, ND.   Rectal: deferred   Musc/Skeltl: CDI incision scar on knee.  Extremities:  No CCE  Neurologic:  Oriented x 3.  No tremor.  Moves all 4 limbs, strength not tested.  Skin:  No rash, no telangectasia.   Tattoos:  none Nodes:  No cervical adenopathy.    Psych:  Pleasant, cooperative, calm, not depressed or anxious.   Intake/Output from previous day:   Intake/Output this shift:    LAB RESULTS:  Recent Labs  09/18/15 0851  WBC 10.4  HGB 14.0  HCT 42.3  PLT 207   BMET Lab Results  Component Value Date   NA 136 09/18/2015   NA 137 08/14/2015   NA 135 08/09/2015   K 4.0 09/18/2015   K 4.5 08/14/2015   K 3.8 08/09/2015   CL 103 09/18/2015   CL 98* 08/09/2015   CL 102 07/29/2015   CO2 23 09/18/2015  CO2 27 08/09/2015   CO2 29 07/29/2015   GLUCOSE 146* 09/18/2015   GLUCOSE 104* 08/09/2015   GLUCOSE 80 07/29/2015   BUN 19 09/18/2015   BUN 13 08/14/2015   BUN 14 08/09/2015   CREATININE 0.82 09/18/2015   CREATININE 0.8 08/14/2015   CREATININE 0.84 08/09/2015   CALCIUM 9.3 09/18/2015   CALCIUM 8.7* 08/09/2015   CALCIUM 9.6 07/29/2015   LFT  Recent Labs  09/18/15 1302  PROT 6.6  ALBUMIN 3.3*  AST 19  ALT 14*  ALKPHOS 59  BILITOT 0.7  BILIDIR 0.2  IBILI 0.5   PT/INR Lab Results  Component Value Date   INR 1.03 07/29/2015   INR 0.99 02/10/2011   INR 1.0 RATIO 08/22/2008    Drugs of Abuse  No results found for: LABOPIA, COCAINSCRNUR, LABBENZ, AMPHETMU, THCU, LABBARB   RADIOLOGY  STUDIES: Dg Chest 2 View  09/18/2015  CLINICAL DATA:  Left side chest pain starting this morning EXAM: CHEST  2 VIEW COMPARISON:  07/29/2015 FINDINGS: Borderline cardiomegaly. Study is limited by poor inspiration. Bilateral basilar atelectasis. No gross infiltrate or pulmonary edema. Mild degenerative changes lower thoracic spine. IMPRESSION: Limited study by poor inspiration with bilateral basilar atelectasis. No gross infiltrate or pulmonary edema. Degenerative changes lower thoracic spine. Electronically Signed   By: Lahoma Crocker M.D.   On: 09/18/2015 09:19   Ct Angio Chest Pe W/cm &/or Wo Cm  09/18/2015  CLINICAL DATA:  Shortness of breath and left-sided chest pain EXAM: CT ANGIOGRAPHY CHEST WITH CONTRAST TECHNIQUE: Multidetector CT imaging of the chest was performed using the standard protocol during bolus administration of intravenous contrast. Multiplanar CT image reconstructions and MIPs were obtained to evaluate the vascular anatomy. CONTRAST:  142mL OMNIPAQUE IOHEXOL 350 MG/ML SOLN COMPARISON:  Chest CT December 11, 2013 ; chest radiograph September 18, 2015 FINDINGS: Mediastinum/Lymph Nodes: There is no demonstrable pulmonary embolus. There is prominence of the ascending thoracic aorta with a maximum transverse diameter of 4.1 x 4.1 cm. There is no thoracic aortic dissection. The visualized great vessels appear unremarkable. There are multiple foci of coronary artery calcification. There is left ventricular hypertrophy. The pericardium is not appreciably thickened. Thyroid appears unremarkable. There is no appreciable thoracic adenopathy. There is a small hiatal hernia. Lungs/Pleura: On axial slice 47 series [id number], there is a stable 4 mm nodular opacity in the anterior segment right upper lobe. On axial slice 59 series A999333, there is a stable 3 mm nodular opacity in the posterior segment of the right upper lobe, stable. There is a 4 mm stable nodular opacity in the anterior segment of the right lower lobe seen on  axial slice 67 series [id number]. There is patchy bibasilar atelectatic change. Other nodular opacities seen on prior study are not evident currently. There is no edema or consolidation. There are occasional foci of parietal pleural thickening, also present on prior study. Upper abdomen: In the visualized upper abdomen, no lesions are apparent beyond atherosclerotic calcification in the aorta. Musculoskeletal: There is degenerative change in the thoracic spine. There are no blastic or lytic bone lesions. There is postoperative change in the left shoulder region. Review of the MIP images confirms the above findings. IMPRESSION: No demonstrable pulmonary embolus. Prominence of the ascending thoracic aorta with maximum transverse diameter of 4.1 x 4.1 cm. Recommend annual imaging followup by CTA or MRA. This recommendation follows 2010 ACCF/AHA/AATS/ACR/ASA/SCA/SCAI/SIR/STS/SVM Guidelines for the Diagnosis and Management of Patients with Thoracic Aortic Disease. Circulation. 2010; 121: [id number] No thoracic aortic dissection. Multiple  foci of coronary artery calcification noted. Stable small nodular opacities as noted above in the lung parenchyma. Bibasilar atelectasis. 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 No demonstrable adenopathy. Small hiatal hernia. Electronically Signed   By: Lowella Grip III M.D.   On: 09/18/2015 11:52    ENDOSCOPIC STUDIES: Per HPI  IMPRESSION:   *  Acute left upper abdominal and lower chest pain.  This is atypical for cardiac source.  Labs do not support any issues with liver, Lipase is pending.  Hx of dysphagia, this is stable and primarily to solids.  Hx of esophageal stricture 1996, no stricture on EGD with empiric dilatation in 2014.  Compliant with daily PPI.    *  Chronic constipation.  Worse in setting of 6 percocet per day, despite Miralax and senokot ( not clear he is using the latter)  PLAN:     *   Per Dr Fuller Plan. Ok to eat,    Azucena Freed  09/18/2015, 1:48 PM Pager: [phone]      Attending physician's note   I have taken a history, examined the patient and reviewed the chart. I agree with the Advanced Practitioner's note, impression and recommendations. Acute left chest pain and LUQ abdominal pain. Symptoms noted following eating chili last night. Chest pain exacerbated by deep breathing. He has ongoing mild solid food dysphagia. PPI bid for now. Abd films show increase stool burden. Chest CT angio did not show a PE. Symptoms partially improved with GI cocktail. EGD with possible dilation tomorrow to further evaluate. The risks (including bleeding, perforation, infection, missed lesions, medication reactions and possible hospitalization or surgery if complications occur), benefits, and alternatives to endoscopy with possible biopsy and possible dilation were discussed with the patient and they consent to proceed.    Lucio Edward, MD [facility] [phone] Mon-Fri 8a-5p [phone] after 5p, weekends, holidays

## 2015-09-19 NOTE — Interval H&P Note (Signed)
History and Physical Interval Note:  09/19/2015 1:33 PM  [name] [name]  has presented today for surgery, with the diagnosis of chest pain, dysphagia  The various methods of treatment have been discussed with the patient and family. After consideration of risks, benefits and other options for treatment, the patient has consented to  Procedure(s): ESOPHAGOGASTRODUODENOSCOPY (EGD) (N/A) BALLOON DILATION (N/A) as a surgical intervention .  The patient's history has been reviewed, patient examined, no change in status, stable for surgery.  I have reviewed the patient's chart and labs.  Questions were answered to the patient's satisfaction.     Pricilla Riffle. Fuller Plan

## 2015-09-21 ENCOUNTER — Encounter (HOSPITAL_COMMUNITY): Payer: Self-pay | Admitting: Gastroenterology

## 2015-09-22 ENCOUNTER — Encounter: Payer: Self-pay | Admitting: Gastroenterology

## 2015-09-22 ENCOUNTER — Telehealth: Payer: Self-pay | Admitting: Internal Medicine

## 2015-09-22 NOTE — Telephone Encounter (Signed)
Per Dr. Fuller Plan patient needs follow up in 2 months.  He is scheduled for follow up on 11/17/15.  Wife notified

## 2015-10-27 ENCOUNTER — Encounter: Payer: Self-pay | Admitting: Internal Medicine

## 2015-10-27 ENCOUNTER — Ambulatory Visit (INDEPENDENT_AMBULATORY_CARE_PROVIDER_SITE_OTHER): Payer: Medicare Other | Admitting: Internal Medicine

## 2015-10-27 VITALS — BP 130/71 | HR 94 | Ht 70.0 in | Wt 267.8 lb

## 2015-10-27 DIAGNOSIS — I10 Essential (primary) hypertension: Secondary | ICD-10-CM | POA: Diagnosis not present

## 2015-10-27 DIAGNOSIS — E785 Hyperlipidemia, unspecified: Secondary | ICD-10-CM

## 2015-10-27 DIAGNOSIS — R079 Chest pain, unspecified: Secondary | ICD-10-CM

## 2015-10-27 NOTE — Progress Notes (Signed)
Cardiology Office Note   Date:  10/27/2015   ID:  [name] [name], DOB [birth year], MRN [id number]  PCP:  [name], MD  Cardiologist:   [name], MD   F?U of CAD and CP      History of Present Illness: [name] [name] is a 70 y.o. male with a history of Sleep apnea, HTN HL and GERD (severe), esophageal stricture  Hx of nonobstrucive CAD  Cath 2010   Admitted on 3/24 with CP/Abdoiminal pain.  Came/went.  Nausea   Problems swalowing meat.   I saw the pt  Felt it was noncardiac  Prob musculoskeletal Dizzy  CT angio neg for PE  GI seen  Recomm holdng ASA for 2 wks  Continuing PPI     Minimal CAD at cath in 2010   Mild dliation of aorta with calcification      Pt says he has occasional twinges in L chest  With sitting   Outpatient Prescriptions Prior to Visit  Medication Sig Dispense Refill  . aspirin EC 81 MG tablet Take 1 tablet (81 mg total) by mouth 2 (two) times daily.    . hydrochlorothiazide (HYDRODIURIL) 25 MG tablet Take 25 mg by mouth daily.     [name] levothyroxine (SYNTHROID, LEVOTHROID) 150 MCG tablet Take 150 mcg by mouth daily before breakfast.    . losartan (COZAAR) 50 MG tablet Take 50 mg by mouth daily.    [name] omeprazole (PRILOSEC) 40 MG capsule Take 1 capsule (40 mg total) by mouth daily before breakfast. 30 minutes before 90 capsule 3  . polyethylene glycol (MIRALAX) packet Take 17 g by mouth daily.      . simvastatin (ZOCOR) 20 MG tablet Take 1 tablet (20 mg total) by mouth daily at 6 PM. 90 tablet 3  . triamcinolone cream (KENALOG) 0.1 % Apply 1 application topically 2 (two) times daily. Reported on 08/26/2015    . Trospium Chloride 60 MG CP24 Take 60 mg by mouth daily.    . citalopram (CELEXA) 20 MG tablet Take 10 mg by mouth every other day. Reported on 10/27/2015    . oxyCODONE-acetaminophen (ROXICET) 5-325 MG tablet Take one to two tablets by mouth every 4 hours as needed for severe pain. Do not exceed 4gm of Tylenol in 24 hours (Patient not taking: Reported  on 10/27/2015) 360 tablet 0  . sennosides-docusate sodium (SENOKOT-S) 8.6-50 MG tablet Take 1 tablet by mouth 2 (two) times daily. Reported on 10/27/2015     No facility-administered medications prior to visit.     Allergies:   Penicillins; Codeine; and Sulfonamide derivatives   Past Medical History  Diagnosis Date  . Observed sleep apnea     can't wear cpap  . HTN (hypertension)   . GERD (gastroesophageal reflux disease)   . Dyspnea     -PFTs compeltely normal 03/20/08 including DLc0  . Sleep apnea     Most recent sleep study 2010; records at [facility] office  . CAD (coronary artery disease)     cath 3/10: mLAD 30%, pRCA 30%, EF 55-60% Dr. Harrington Challenger cardiologist  most recent  stress stress done ~ 2 years ago with Dr. Harrington Challenger  . Hypothyroidism   . Low testosterone   . Arthritis   . Depression   . Polycythemia   . Pulmonary nodule 06/13/2012  . Rosacea   . PONV (postoperative nausea and vomiting)   . Complication of anesthesia     "he usually gets an ileus after back OR"   .  Environmental allergies     Dust, Smoke    Past Surgical History  Procedure Laterality Date  . Cataract extraction, bilateral  2010  . Meniscus repair  08/2008    left  . Tonsillectomy  09/21/11  . Uvulopalatopharyngoplasty, tonsillectomy and septoplasty  09/21/11    Deviated Septum  . Posterior fusion lumbar spine  2005; 01/2007; 04/2009    L5; L3-4; L2-3  . Lumbar disc surgery  06/2006    L3  . Back surgery  2005; 06/2006; 01/2007; 04/2009  . Prostate surgery  2000  . Transurethral resection of prostate  2006    followed by "surgery to get rid of clots"  . Shoulder open rotator cuff repair Left 1999    left  . Cervical fusion  04/2002    ?C3-4  . 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 Cardiac catheterization  08/2008  . Esophagogastroduodenoscopy  multiple    last 07/2012 GERD esophagitis, 48 Fr dilation  . Colonoscopy  12/2005    normal screening study.   . Knee arthroscopy with medial  menisectomy Right 05/14/2015    Procedure: RIGHT KNEE ARTHROSCOPY CHONDROPLASTY, PARTIAL  MEDIAL MENISECTOMY;  Surgeon: [name], MD;  Location: [place];  Service: Orthopedics;  Laterality: Right;  . Total knee arthroplasty Right 08/08/2015    Procedure: RIGHT TOTAL KNEE ARTHROPLASTY;  Surgeon: [name], MD;  Location: [place];  Service: Orthopedics;  Laterality: Right;  . Esophagogastroduodenoscopy N/A 09/19/2015    Procedure: ESOPHAGOGASTRODUODENOSCOPY (EGD);  Surgeon: [name], MD;  Location[facility]OSCOPY;  Service: Endoscopy;  Laterality: N/A;  . Savory dilation N/A 09/19/2015    Procedure: SAVORY DILATION;  Surgeon: [name], MD;  Location[facility] ENDOSCOPY;  Service: Endoscopy;  Laterality: N/A;     Social History:  The patient  reports that he quit smoking about 41 years ago. His smoking use included Cigarettes. He has a 27 pack-year smoking history. He has never used smokeless tobacco. He reports that he does not drink alcohol or use illicit drugs.   Family History:  The patient's family history includes Alzheimer's disease in his father; Breast cancer in his mother and sister; Colon polyps in his mother; Diabetes in his mother and sister; Heart Problems in his sister; Heart attack in his father; Liver cancer in his maternal uncle; Lung cancer in his paternal uncle; Throat cancer in his maternal uncle. There is no history of Anesthesia problems.    ROS:  Please see the history of present illness. All other systems are reviewed and  Negative to the above problem except as noted.    PHYSICAL EXAM: VS:  BP 130/71 mmHg  Pulse 94  Ht 5\' 10"  (1.778 m)  Wt 267 lb 12.8 oz (121.473 kg)  BMI 38.43 kg/m2  GEN: Well nourished, well developed, in no acute distress HEENT: normal Neck: no JVD, carotid bruits, or masses Cardiac: RRR; no murmurs, rubs, or gallops,Tr  edema  Respiratory:  clear to auscultation bilaterally, normal work of breathing GI: soft,  nontender, nondistended, + BS  No hepatomegaly  MS: no deformity Moving all extremities   Skin: warm and dry, no rash Neuro:  Strength and sensation are intact Psych: euthymic mood, full affect   EKG:  EKG is  Not ordered today.   Lipid Panel No results found for: CHOL, TRIG, HDL, CHOLHDL, VLDL, LDLCALC, LDLDIRECT    Wt Readings from Last 3 Encounters:  10/27/15 267 lb 12.8 oz (121.473 kg)  09/18/15  270 lb (122.471 kg)  08/26/15 279 lb 12.8 oz (126.916 kg)      ASSESSMENT AND PLAN:  1  CP  Atypical  Occurs with sitting  Transient    2  CAD  MIld at cath  3  HTN  BP is OK    4  HL  WIll get lipids from Dr Inda Merlin    5  Thoracic aneurysm  F/U 1 yr    6  GI  IMproved swalloing   Follow in GI   7  Edema  Cut back on salt   Signed, [name], MD  10/27/2015 9:27 AM    [address], [place], [place]  [address] Phone: [phone]; Fax: [phone]

## 2015-10-27 NOTE — Patient Instructions (Signed)
Your physician recommends that you continue on your current medications as directed. Please refer to the Current Medication list given to you today. Your physician wants you to follow-up in: January 2018 WITH DR. Harrington Challenger.   You will receive a reminder letter in the mail two months in advance. If you don't receive a letter, please call our office to schedule the follow-up appointment.

## 2015-11-17 ENCOUNTER — Ambulatory Visit (INDEPENDENT_AMBULATORY_CARE_PROVIDER_SITE_OTHER): Payer: Medicare Other | Admitting: Internal Medicine

## 2015-11-17 ENCOUNTER — Encounter: Payer: Self-pay | Admitting: Internal Medicine

## 2015-11-17 VITALS — BP 114/68 | HR 88 | Ht 71.0 in | Wt 269.0 lb

## 2015-11-17 DIAGNOSIS — K5909 Other constipation: Secondary | ICD-10-CM

## 2015-11-17 DIAGNOSIS — K5900 Constipation, unspecified: Secondary | ICD-10-CM

## 2015-11-17 DIAGNOSIS — K222 Esophageal obstruction: Secondary | ICD-10-CM

## 2015-11-17 DIAGNOSIS — K317 Polyp of stomach and duodenum: Secondary | ICD-10-CM | POA: Diagnosis not present

## 2015-11-17 DIAGNOSIS — Z8489 Family history of other specified conditions: Secondary | ICD-10-CM | POA: Diagnosis not present

## 2015-11-17 DIAGNOSIS — Z1211 Encounter for screening for malignant neoplasm of colon: Secondary | ICD-10-CM | POA: Diagnosis not present

## 2015-11-17 DIAGNOSIS — K219 Gastro-esophageal reflux disease without esophagitis: Secondary | ICD-10-CM | POA: Diagnosis not present

## 2015-11-17 NOTE — Patient Instructions (Addendum)
  You have been scheduled for a colonoscopy. Please follow written instructions given to you at your visit today.  Please pick up your prep supplies at the pharmacy. If you use inhalers (even only as needed), please bring them with you on the day of your procedure.   Please use a bottle of Magnesium Citrate the day before you prep for your colonoscopy.   Take 2 doses of miralax daily.  You will be contacted about genetic counseling.   I appreciate the opportunity to care for you.

## 2015-11-17 NOTE — Progress Notes (Signed)
Subjective:    Patient ID: [name] [name], male    DOB: 1946/05/06, 70 y.o.   MRN: [id number] Chief complaint: Follow-up after esophageal stricture dilation, family history of genetic polyp syndrome HPI The patient is here with his wife, she contributes the history, he was hospitalized with some chest pain issues returned at the musculoskeletal. While there because of his symptoms he was having some dysphagia as well as Dr. Fuller Plan did an upper endoscopy found a small inflamed hyperplastic polyp that was biopsied, also esophageal stenosis/stricture was dilated with Savary dilator. His chest pain symptoms are much better, he is not having any dysphagia at this time. He remains on a PPI.  His sister wrote him a letter saying she had the MUTYH  mutation and has numerous polyps in her colon that have been removed. Sounds like his mother also had the same problem. His sister and mother both had breast cancer as well. The patient had a colonoscopy in July 2007 for screening without neoplasia. He wants further testing to see if he has the M UYTH mutation. He denies any lower GI symptoms.  Allergies  Allergen Reactions  . Penicillins Hives and Other (See Comments)    "I break out in welts" Has patient had a PCN reaction causing immediate rash, facial/tongue/throat swelling, SOB or lightheadedness with hypotension: No Has patient had a PCN reaction causing severe rash involving mucus membranes or skin necrosis: No Has patient had a PCN reaction that required hospitalization No Has patient had a PCN reaction occurring within the last 10 years: No If all of the above answers are "NO", then may proceed with Cephalosporin use.  . Codeine Nausea And Vomiting  . Sulfonamide Derivatives Nausea And Vomiting   Outpatient Prescriptions Prior to Visit  Medication Sig Dispense Refill  . aspirin EC 81 MG tablet Take 1 tablet (81 mg total) by mouth 2 (two) times daily.    . hydrochlorothiazide (HYDRODIURIL) 25 MG  tablet Take 25 mg by mouth daily.     [name] levothyroxine (SYNTHROID, LEVOTHROID) 150 MCG tablet Take 150 mcg by mouth daily before breakfast.    . losartan (COZAAR) 50 MG tablet Take 50 mg by mouth daily.    [name] omeprazole (PRILOSEC) 40 MG capsule Take 1 capsule (40 mg total) by mouth daily before breakfast. 30 minutes before 90 capsule 3  . polyethylene glycol (MIRALAX) packet Take 17 g by mouth daily.      . simvastatin (ZOCOR) 20 MG tablet Take 1 tablet (20 mg total) by mouth daily at 6 PM. 90 tablet 3  . triamcinolone cream (KENALOG) 0.1 % Apply 1 application topically 2 (two) times daily. Reported on 08/26/2015    . Trospium Chloride 60 MG CP24 Take 60 mg by mouth daily.     No facility-administered medications prior to visit.   Past Medical History  Diagnosis Date  . Observed sleep apnea     can't wear cpap  . HTN (hypertension)   . GERD (gastroesophageal reflux disease)   . Dyspnea     -PFTs compeltely normal 03/20/08 including DLc0  . Sleep apnea     Most recent sleep study 2010; records at [facility] office  . CAD (coronary artery disease)     cath 3/10: mLAD 30%, pRCA 30%, EF 55-60% Dr. Harrington Challenger cardiologist  most recent  stress stress done ~ 2 years ago with Dr. Harrington Challenger  . Hypothyroidism   . Low testosterone   . Arthritis   . Depression   .  Polycythemia   . Pulmonary nodule 06/13/2012  . Rosacea   . PONV (postoperative nausea and vomiting)   . Complication of anesthesia     "he usually gets an ileus after back OR"   . Environmental allergies     Dust, Smoke   Past Surgical History  Procedure Laterality Date  . Cataract extraction, bilateral  2010  . Meniscus repair  08/2008    left  . Tonsillectomy  09/21/11  . Uvulopalatopharyngoplasty, tonsillectomy and septoplasty  09/21/11    Deviated Septum  . Posterior fusion lumbar spine  2005; 01/2007; 04/2009    L5; L3-4; L2-3  . Lumbar disc surgery  06/2006    L3  . Back surgery  2005; 06/2006; 01/2007; 04/2009  . Prostate surgery   2000  . Transurethral resection of prostate  2006    followed by "surgery to get rid of clots"  . Shoulder open rotator cuff repair Left 1999    left  . Cervical fusion  04/2002    ?C3-4  . 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 Cardiac catheterization  08/2008  . Esophagogastroduodenoscopy  multiple    last 07/2012 GERD esophagitis, 48 Fr dilation  . Colonoscopy  12/2005    normal screening study.   . Knee arthroscopy with medial menisectomy Right 05/14/2015    Procedure: RIGHT KNEE ARTHROSCOPY CHONDROPLASTY, PARTIAL  MEDIAL MENISECTOMY;  Surgeon: [name], MD;  Location: [place];  Service: Orthopedics;  Laterality: Right;  . Total knee arthroplasty Right 08/08/2015    Procedure: RIGHT TOTAL KNEE ARTHROPLASTY;  Surgeon: [name], MD;  Location: [place];  Service: Orthopedics;  Laterality: Right;  . Esophagogastroduodenoscopy N/A 09/19/2015    Procedure: ESOPHAGOGASTRODUODENOSCOPY (EGD);  Surgeon: [name], MD;  [facility] ENDOSCOPY;  Service: Endoscopy;  Laterality: N/A;  . Savory dilation N/A 09/19/2015    Procedure: SAVORY DILATION;  Surgeon: [name], MD;  Location: [facility] ENDOSCOPY;  Service: Endoscopy;  Laterality: N/A;   Social History   Social History  . Marital Status: Married    Spouse Name: N/A  . Number of Children: 3  . Years of Education: N/A   Occupational History  . retired [employer] History Main Topics  . Smoking status: Former Smoker -- 1.50 packs/day for 18 years    Types: Cigarettes    Quit date: 06/28/1974  . Smokeless tobacco: Never Used  . Alcohol Use: No  . Drug Use: No  . Sexual Activity: No   Other Topics Concern  . None   Social History Narrative   Area, 3 children.   Retired from the city of [place]   5 caffeinated beverages daily   Family History  Problem Relation Age of Onset  . Anesthesia problems Neg Hx   . Breast cancer Mother   . Colon polyps Mother   . Diabetes  Mother   . Breast cancer Sister   . Diabetes Sister     borderline  . Heart Problems Sister   . Heart attack Father   . Alzheimer's disease Father   . Lung cancer Paternal Uncle     x4  . Throat cancer Maternal Uncle   . Liver cancer Maternal Uncle     Review of Systems As above    Objective:   Physical Exam @BP  114/68 mmHg  Pulse 88  Ht 5\' 11"  (1.803 m)  Wt 269 lb (122.018 kg)  BMI 37.53 kg/m2@  General:  NAD Eyes:   anicteric Lungs:  clear Heart:: S1S2 no rubs, murmurs or gallops Abdomen:  soft and nontender, BS+ Ext:   no edema, cyanosis or clubbing    Data Reviewed:  As above    Assessment & Plan:   1. GERD with stricture   2. Gastric polyp   3. Family history of genetic disorder - MUYTH in sister and ? mother   70. Colon cancer screening   5. Chronic constipation    His GERD and stricture are improved. Stay on PPI  Colonoscopy will be scheduled for screening. The risks and benefits as well as alternatives of endoscopic procedure(s) have been discussed and reviewed. All questions answered. The patient agrees to proceed.  Increase MiraLax 2/d  Mag citrate day before prep  Genetics referral  I appreciate the opportunity to care for this patient. [id number] RUTH, MD

## 2015-11-27 ENCOUNTER — Encounter: Payer: Self-pay | Admitting: Genetic Counselor

## 2016-01-06 ENCOUNTER — Encounter: Payer: Self-pay | Admitting: Internal Medicine

## 2016-01-07 ENCOUNTER — Other Ambulatory Visit: Payer: Self-pay | Admitting: Nurse Practitioner

## 2016-01-07 ENCOUNTER — Telehealth: Payer: Self-pay | Admitting: Genetic Counselor

## 2016-01-07 NOTE — Telephone Encounter (Signed)
Called Mr. Altamira and spoke with his wife about bringing copy of his sister's test report tomorrow.  He does not have a copy of the test report, but does have a letter that mentions either the gene and/or the gene mutation.  Mr. Bate and his wife will bring that with them tomorrow.

## 2016-01-08 ENCOUNTER — Ambulatory Visit (HOSPITAL_BASED_OUTPATIENT_CLINIC_OR_DEPARTMENT_OTHER): Payer: Medicare Other | Admitting: Genetic Counselor

## 2016-01-08 ENCOUNTER — Other Ambulatory Visit: Payer: Medicare Other

## 2016-01-08 DIAGNOSIS — Z315 Encounter for genetic counseling: Secondary | ICD-10-CM

## 2016-01-08 DIAGNOSIS — Z803 Family history of malignant neoplasm of breast: Secondary | ICD-10-CM | POA: Diagnosis not present

## 2016-01-08 DIAGNOSIS — Z8371 Family history of colonic polyps: Secondary | ICD-10-CM

## 2016-01-08 DIAGNOSIS — Z800 Family history of malignant neoplasm of digestive organs: Secondary | ICD-10-CM

## 2016-01-08 DIAGNOSIS — Z801 Family history of malignant neoplasm of trachea, bronchus and lung: Secondary | ICD-10-CM | POA: Diagnosis not present

## 2016-01-08 DIAGNOSIS — Z8489 Family history of other specified conditions: Secondary | ICD-10-CM

## 2016-01-09 ENCOUNTER — Encounter: Payer: Self-pay | Admitting: Genetic Counselor

## 2016-01-09 NOTE — Progress Notes (Addendum)
REFERRING PROVIDER: Silvano Rusk, MD  PRIMARY PROVIDER:  [name], MD  PRIMARY REASON FOR VISIT:  1. Family history of genetic disorder   2. Family history of colonic polyps   3. Family history of breast cancer in first degree relative   4. Family history of esophageal cancer   5. Family history of lung cancer      HISTORY OF PRESENT ILLNESS:   [name] [name], a 70 y.o. male, was seen for a [facility] cancer genetics consultation at the request of Dr. Carlean Purl due to a family history of a MUTYH pathogenic mutation identified in [name] [name] sister, family history of multiple colon polyps, and family history of cancer.  [name] [name] presents to clinic today with his wife to discuss the possibility of a hereditary predisposition to cancer, genetic testing, and to further clarify his future cancer risks, as well as potential cancer risks for family members.    [name] [name] is a 69 y.o. male with no personal history of cancer.  He has a history of one benign gastric polyp identified on EGD in March 2017.  RISK FACTORS:  Colonoscopy: yes; normal in 12/2005; is scheduled for a colonoscopy with Dr. Carlean Purl on 01/20/2016; history of upper GI endoscopies in 2014 and 2017 - most recent found one hyperplastic gastric polyp. Up to date with prostate exams:  Yes - normal; but does have a history of 3 prostate surgeries due to issues with urination.   Any excessive radiation exposure/other exposures in the past:  Has had several surgeries that required scans/x-rays; reports additional history of some secondhand smoke exposure (parents smoked); and also reports history of working in [employer] in 1969-1970.  Past Medical History  Diagnosis Date  . Observed sleep apnea     can't wear cpap  . HTN (hypertension)   . GERD (gastroesophageal reflux disease)   . Dyspnea     -PFTs compeltely normal 03/20/08 including DLc0  . Sleep apnea     Most recent sleep study 2010; records at [facility] office  .  CAD (coronary artery disease)     cath 3/10: mLAD 30%, pRCA 30%, EF 55-60% Dr. Harrington Challenger cardiologist  most recent  stress stress done ~ 2 years ago with Dr. Harrington Challenger  . Hypothyroidism   . Low testosterone   . Arthritis   . Depression   . Polycythemia   . Pulmonary nodule 06/13/2012  . Rosacea   . PONV (postoperative nausea and vomiting)   . Complication of anesthesia     "he usually gets an ileus after back OR"   . Environmental allergies     Dust, Smoke    Past Surgical History  Procedure Laterality Date  . Cataract extraction, bilateral  2010  . Meniscus repair  08/2008    left  . Tonsillectomy  09/21/11  . Uvulopalatopharyngoplasty, tonsillectomy and septoplasty  09/21/11    Deviated Septum  . Posterior fusion lumbar spine  2005; 01/2007; 04/2009    L5; L3-4; L2-3  . Lumbar disc surgery  06/2006    L3  . Back surgery  2005; 06/2006; 01/2007; 04/2009  . Prostate surgery  2000  . Transurethral resection of prostate  2006    followed by "surgery to get rid of clots"  . Shoulder open rotator cuff repair Left 1999    left  . Cervical fusion  04/2002    ?C3-4  . 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 Cardiac catheterization  08/2008  .  Esophagogastroduodenoscopy  multiple    last 07/2012 GERD esophagitis, 48 Fr dilation  . Colonoscopy  12/2005    normal screening study.   . Knee arthroscopy with medial menisectomy Right 05/14/2015    Procedure: RIGHT KNEE ARTHROSCOPY CHONDROPLASTY, PARTIAL  MEDIAL MENISECTOMY;  Surgeon: Frederico Hamman, MD;  Location: [facility];  Service: Orthopedics;  Laterality: Right;  . Total knee arthroplasty Right 08/08/2015    Procedure: RIGHT TOTAL KNEE ARTHROPLASTY;  Surgeon: Frederico Hamman, MD;  [facility];  Service: Orthopedics;  Laterality: Right;  . Esophagogastroduodenoscopy N/A 09/19/2015    Procedure: ESOPHAGOGASTRODUODENOSCOPY (EGD);  Surgeon: Meryl Dare, MD;  Location: [facility] ENDOSCOPY;  Service: Endoscopy;  Laterality: N/A;  .  Savory dilation N/A 09/19/2015    Procedure: SAVORY DILATION;  Surgeon: Meryl Dare, MD;  Location: [facility] ENDOSCOPY;  Service: Endoscopy;  Laterality: N/A;    Social History   Social History  . Marital Status: Married    Spouse Name: N/A  . Number of Children: 3  . Years of Education: N/A   Occupational History  . retired [employer]   Social History Main Topics  . Smoking status: Former Smoker -- 1.50 packs/day for 18 years    Types: Cigarettes    Quit date: 06/28/1974  . Smokeless tobacco: Never Used  . Alcohol Use: No  . Drug Use: No  . Sexual Activity: No   Other Topics Concern  . None   Social History Narrative   Area, 3 children.   Retired from the city of [place]   5 caffeinated beverages daily     FAMILY HISTORY:  We obtained a detailed, 4-generation family history.  Significant diagnoses are listed below: Family History  Problem Relation Age of Onset  . Anesthesia problems Neg Hx   . Breast cancer Mother     dx. mid-70s  . Colon polyps Mother     "65+ colon polyps per yr"  . Diabetes Mother   . Breast cancer Sister 20  . Other Sister     + MUTYH mutation  . Diabetes Sister     borderline  . Heart Problems Sister   . Breast cancer Sister     dx. 56s  . Heart attack Father   . Alzheimer's disease Father   . Lung cancer Paternal Uncle     x4  . Throat cancer Maternal Uncle     mother's maternal half-brother; dx 67s; +EtOH  . Liver cancer Maternal Uncle     mother's maternal half-brother; either liver cancer or cirrhosis  . Cirrhosis Maternal Uncle     +EtOH  . Stroke Paternal Grandmother   . Heart attack Paternal Grandfather   . Other Daughter     negative genetic testing in 2017  . Parkinson's disease Other   . Bipolar disorder Other   . Lung cancer Paternal Uncle     (x3)  . Lung cancer Paternal Uncle   . Alzheimer's disease Cousin     paternal 1st cousin, d. 31  . Heart attack Cousin     [name] [name] has one daughter and two  sons.  One son died in a motor vehicle accident at age 76.  His other son is currently 67 and has not had cancer.  His daughter, [name] [name] is currently 25 and has not had cancer.  She recently had negative genetic testing for a hereditary cancer panel which included negative analysis for the known MUTYH gene mutation identified in her aunt, [name] [name]  sister, [name] [name].  [name] [name] is currently 11; she was diagnosed with breast cancer at 79.  [name] [name] wrote [name] [name] a note to let him know that she tested positive for an MUTYH mutation and that she is now recommended by her doctors to have a colonoscopy every 5 years.  Thus, this sounds like a heterozygous MUTYH mutation.  [name] [name] has two sons and one daughter, none of whom have had cancer or have had genetic testing.  [name] [name] has two other full sisters, both in their 22s.  One was diagnosed with breast cancer in her 58s; the other has not had cancer.  Mr. Egger also has two full brothers, ages 80 and 38 and neither have had cancer or genetic testing.    Mr. Conely mother died of sepsis at 48.  She was diagnosed with breast cancer in her mid-70s, and she had a history of "20+ colon polyps per year".  She had three maternal half-brothers and one maternal half-sister.  Her maternal half-sister died from non-health related causes as a baby.  One brother is currently 40 and cancer-free.  Another brother has a history of alcohol abuse and was diagnosed with liver cirrhosis and/or liver cancer in his late 60s-early 35s.  This brother has three daughters for whom [name] [name] has no information.  The other brother also has a history of alcohol abuse and was diagnosed with esophageal cancer in his 34s.  This brother had two sons, one of whom has passed away, but for whom [name] [name] has no further information.  [name] [name] maternal grandmother died at 47 from age-related causes.  His grandfather died at about the same age.  He has no further information for other  maternal relatives.  [name] [name] father had four full brothers and one paternal half-brother, all of whom have passed away.  One brother died of a childhood illness.  The remaining brothers were smokers, all had lung cancers, and passed away in their late 68s-70s.  [name] [name] is unaware of any cancer history for his paternal first cousins.  His paternal grandmother was murdered at age 71.  His grandfather died of a heart attack at an older age.  [name] [name] has no information for other paternal relatives.  Patient's maternal and paternal ancestors are of Caucasian descent. There is no reported Ashkenazi Jewish ancestry. There is no known consanguinity.  GENETIC COUNSELING ASSESSMENT: [name] [name] is a 70 y.o. male with a family history of a MUTYH mutation, colon polyps, and cancer. We, therefore, discussed and recommended the following at today's visit.   DISCUSSION: We reviewed the characteristics, features, and inheritance pattern of MUTYH-associated polyposis syndrome. We also discussed genetic testing, including the appropriate family members to test, the process of testing, insurance coverage and turn-around-time for results. We discussed the implications of a negative, positive and/or variant of uncertain significant result. We discussed including additional breast and ovarian cancer related genes due to [name] [name] family history of breast cancer in three first-degree relatives.  We'll also include HOXB13 gene analysis due to Mr. Mccormac history of prostate issues and surgeries.  Because Mr. Vasco mother has a history of "20+ colon polyps per year", we will also include the other genes that can be associated with colorectal cancers and polyposis.  Additionally, MUTYH mutations are not typically thought to incur more than 100 polyps in one's lifetime, so including the additional colon polyps-related genes will ensure we are not missing any potential cause for this history in Mr. Rihn  mother that could also be increasing his own risk.  Thus, we recommended Mr. Forand pursue genetic testing for a 33-gene Custom Cancer Panel through [facility].  This 33-gene Custom Panel offered by [facility] includes sequencing and/or deletion duplication testing of the following genes: APC, ATM, AXIN2, BARD1, BMPR1A, BRCA1, BRCA2, BRIP1, CDH1, CDK4, CDKN2A, CHEK2, EPCAM, FANCC, HOXB13, MLH1, MSH2, MSH6, MUTYH, NBN, PALB2, PMS2, POLD1, POLE, PTEN, RAD51C, RAD51D, SCG5/GREM1, SMAD4, STK11, TP53, VHL, and XRCC2.    Based on Mr. Schreifels personal and family history of cancer, he meets medical criteria for genetic testing. Despite that he meets criteria, he may still have an out of pocket cost. We discussed that if his out of pocket cost for testing is over $100, the laboratory will call and confirm whether he wants to proceed with testing.  If the out of pocket cost of testing is less than $100 he will be billed by the genetic testing laboratory.   PLAN: After considering the risks, benefits, and limitations, Mr. Knipple  provided informed consent to pursue genetic testing and the blood sample was sent to [facility] for analysis of the 33-gene Custom Panel. Results should be available within approximately 2-3 weeks' time, at which point they will be disclosed by telephone to Mr. Manrique, as will any additional recommendations warranted by these results. Mr. Fedor will receive a summary of his genetic counseling visit and a copy of his results once available. This information will also be available in Epic. We encouraged Mr. Vigen to remain in contact with cancer genetics annually so that we can continuously update the family history and inform him of any changes in cancer genetics and testing that may be of benefit for his family. Mr. Abel questions were answered to his satisfaction today. Our contact information was provided should additional questions or concerns arise.  Thank you for  the referral and allowing [name] to share in the care of your patient.   Jeanine Luz, MS, [facility] Certified Genetic Counselor [email or web address] Phone: [phone]  The patient was seen for a total of 60 minutes in face-to-face genetic counseling.  This patient was discussed with Drs. Magrinat, Lindi Adie and/or Burr Medico who agrees with the above.    _______________________________________________________________________ For Office Staff:  Number of people involved in session: 2 Was an Intern/ student involved with case: no

## 2016-01-14 ENCOUNTER — Telehealth: Payer: Self-pay | Admitting: Genetic Counselor

## 2016-01-14 NOTE — Telephone Encounter (Signed)
Let Mr. Lichtenfels and his wife know that I am adding additional colon and prostate related genes to his genetic testing order due to the family history (low likelihood we will find something, but this way we are covering all of our bases).  They agree to this addition.  Also discussed with Mr. Wyne wife that she can have genetic testing due to her having colon cancer in her 62s.  Her son and daughter both should be having colonoscopies now and should likely be getting them approx every 3-5 years.  Ms. Pole is interested in making an appointment.  I gave her [place] number to call and schedule this.  She is welcome to call with any questions.

## 2016-01-20 ENCOUNTER — Ambulatory Visit (AMBULATORY_SURGERY_CENTER): Payer: Medicare Other | Admitting: Internal Medicine

## 2016-01-20 ENCOUNTER — Encounter: Payer: Self-pay | Admitting: Internal Medicine

## 2016-01-20 VITALS — BP 105/65 | HR 65 | Temp 99.6°F | Resp 12 | Ht 71.0 in | Wt 269.0 lb

## 2016-01-20 DIAGNOSIS — Z1211 Encounter for screening for malignant neoplasm of colon: Secondary | ICD-10-CM

## 2016-01-20 MED ORDER — FLEET ENEMA 7-19 GM/118ML RE ENEM
1.0000 | ENEMA | Freq: Once | RECTAL | Status: AC
  Administered 2016-01-20: 1 via RECTAL

## 2016-01-20 NOTE — Op Note (Signed)
[facility] Patient Name: [name] [name] Procedure Date: 01/20/2016 11:19 AM MRN: [id number] Endoscopist: Gatha Mayer , MD Age: 70 Referring MD:  Date of Birth: 01/19/1946 Gender: Male Account #: [id number] Procedure:                Colonoscopy Indications:              Screening for colorectal malignant neoplasm Medicines:                Propofol per Anesthesia, Monitored Anesthesia Care Procedure:                Pre-Anesthesia Assessment:                           - Prior to the procedure, a History and Physical                            was performed, and patient medications and                            allergies were reviewed. The patient's tolerance of                            previous anesthesia was also reviewed. The risks                            and benefits of the procedure and the sedation                            options and risks were discussed with the patient.                            All questions were answered, and informed consent                            was obtained. Prior Anticoagulants: The patient has                            taken no previous anticoagulant or antiplatelet                            agents. ASA Grade Assessment: III - A patient with                            severe systemic disease. After reviewing the risks                            and benefits, the patient was deemed in                            satisfactory condition to undergo the procedure.                           After obtaining informed consent, the colonoscope  was passed under direct vision. Throughout the                            procedure, the patient's blood pressure, pulse, and                            oxygen saturations were monitored continuously. The                            Model CF-H180AL [id number]) scope was introduced                            through the anus and advanced to the the cecum,                             identified by appendiceal orifice and ileocecal                            valve. The quality of the bowel preparation was                            adequate. The colonoscopy was performed without                            difficulty. The patient tolerated the procedure                            well. The bowel preparation used was Miralax. The                            ileocecal valve, appendiceal orifice, and rectum                            were photographed. Scope In: 11:25:58 AM Scope Out: [id number] AM Total Procedure Duration: 0 hours 20 minutes 46 seconds  Findings:                 The perianal and digital rectal examinations were                            normal. Pertinent negatives include normal prostate                            (size, shape, and consistency).                           The colon (entire examined portion) appeared normal.                           No additional abnormalities were found on                            retroflexion. Complications:            No immediate complications. Estimated blood loss:  None. Estimated Blood Loss:     Estimated blood loss: none. Recommendation:           - Repeat colonoscopy in 10 years for screening                            purposes.                           - Patient has a contact number available for                            emergencies. The signs and symptoms of potential                            delayed complications were discussed with the                            patient. Return to normal activities tomorrow.                            Written discharge instructions were provided to the                            patient.                           - Resume previous diet.                           - Continue present medications.                           - Patient has a contact number available for                            emergencies. The signs and symptoms of potential                             delayed complications were discussed with the                            patient. Return to normal activities tomorrow.                            Written discharge instructions were provided to the                            patient. Gatha Mayer, MD 01/20/2016 11:52:52 AM This report has been signed electronically.

## 2016-01-20 NOTE — Progress Notes (Signed)
Patient states that his stool is dark brown and that we probably could not see through it. He also states that he did not look in the toilet. Dr. Carlean Purl was notified and a fleets enema was ordered and given.  Patient told not to flush after BM.  The stool was still dark brown liquid. Dr. Carlean Purl stated to give him another enema and he would try to do the procedure. Stool is still the same. Will proceed with procedure.

## 2016-01-20 NOTE — Patient Instructions (Addendum)
Normal colonoscopy. Next routine colonoscopy in 10 years - 2027  I appreciate the opportunity to care for you. Gatha Mayer, MD, FACG   YOU HAD AN ENDOSCOPIC PROCEDURE TODAY AT [facility]:   Refer to the procedure report that was given to you for any specific questions about what was found during the examination.  If the procedure report does not answer your questions, please call your gastroenterologist to clarify.  If you requested that your care partner not be given the details of your procedure findings, then the procedure report has been included in a sealed envelope for you to review at your convenience later.  YOU SHOULD EXPECT: Some feelings of bloating in the abdomen. Passage of more gas than usual.  Walking can help get rid of the air that was put into your GI tract during the procedure and reduce the bloating. If you had a lower endoscopy (such as a colonoscopy or flexible sigmoidoscopy) you may notice spotting of blood in your stool or on the toilet paper. If you underwent a bowel prep for your procedure, you may not have a normal bowel movement for a few days.  Please Note:  You might notice some irritation and congestion in your nose or some drainage.  This is from the oxygen used during your procedure.  There is no need for concern and it should clear up in a day or so.  SYMPTOMS TO REPORT IMMEDIATELY:   Following lower endoscopy (colonoscopy or flexible sigmoidoscopy):  Excessive amounts of blood in the stool  Significant tenderness or worsening of abdominal pains  Swelling of the abdomen that is new, acute  Fever of 100F or higher    For urgent or emergent issues, a gastroenterologist can be reached at any hour by calling [phone].   DIET: Your first meal following the procedure should be a small meal and then it is ok to progress to your normal diet. Heavy or fried foods are harder to digest and may make you feel nauseous or bloated.   Likewise, meals heavy in dairy and vegetables can increase bloating.  Drink plenty of fluids but you should avoid alcoholic beverages for 24 hours.  ACTIVITY:  You should plan to take it easy for the rest of today and you should NOT DRIVE or use heavy machinery until tomorrow (because of the sedation medicines used during the test).    FOLLOW UP: Our staff will call the number listed on your records the next business day following your procedure to check on you and address any questions or concerns that you may have regarding the information given to you following your procedure. If we do not reach you, we will leave a message.  However, if you are feeling well and you are not experiencing any problems, there is no need to return our call.  We will assume that you have returned to your regular daily activities without incident.  If any biopsies were taken you will be contacted by phone or by letter within the next 1-3 weeks.  Please call us at [phone] if you have not heard about the biopsies in 3 weeks.    SIGNATURES/CONFIDENTIALITY: You and/or your care partner have signed paperwork which will be entered into your electronic medical record.  These signatures attest to the fact that that the information above on your After Visit Summary has been reviewed and is understood.  Full responsibility of the confidentiality of this discharge information lies with you  and/or your care-partner.

## 2016-01-21 ENCOUNTER — Telehealth: Payer: Self-pay | Admitting: *Deleted

## 2016-01-21 NOTE — Telephone Encounter (Signed)
  Follow up Call-  Call back number 01/20/2016  Post procedure Call Back phone  # [phone]  Permission to leave phone message Yes  Some recent data might be hidden     Patient questions:  Do you have a fever, pain , or abdominal swelling? No. Pain Score  0 *  Have you tolerated food without any problems? Yes.    Have you been able to return to your normal activities? Yes.    Do you have any questions about your discharge instructions: Diet   no Medications  Yes.   Follow up visit  No.  Do you have questions or concerns about your Care? No.  Actions: * If pain score is 4 or above: No action needed, pain <4. Pt. Wife had questions about miralax that pt was taking prior to procedure,explained to wife that we were referring to pt. To stop miralax when it came to the prep not what he was taking prior to the procedure and that he is to resume medication that he was taking. Then she wanted to know about s/s of complication and explained to her the s/s that was reviewed wit her in recovery,she verbalize understanding.

## 2016-01-27 ENCOUNTER — Other Ambulatory Visit: Payer: Self-pay | Admitting: Neurology

## 2016-01-27 ENCOUNTER — Ambulatory Visit (INDEPENDENT_AMBULATORY_CARE_PROVIDER_SITE_OTHER): Payer: Medicare Other | Admitting: Neurology

## 2016-01-27 ENCOUNTER — Encounter: Payer: Self-pay | Admitting: Neurology

## 2016-01-27 VITALS — BP 113/72 | HR 81 | Ht 71.0 in | Wt 268.4 lb

## 2016-01-27 DIAGNOSIS — R269 Unspecified abnormalities of gait and mobility: Secondary | ICD-10-CM | POA: Diagnosis not present

## 2016-01-27 NOTE — Progress Notes (Signed)
[facility] [address] [place]. [place] [address] [phone]       OFFICE CONSULT NOTE  [name] Date of Birth:  [birth year] Medical Record Number:  [id number]   Referring MD: Darcus Austin, MD  Reason for Referral:  Gait imlabance HPI: [name] is a 70 year old Caucasian male who has had chronic long-standing gait and balance difficulties going back at least 3-4 years. He describes this as trouble with balance and walking in his knees giving out. 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 He recently underwent his second knee surgery but notices that his leg gives out suddenly. He has been both his orthopedic surgeon as well as back surgeon who did not believe his balance and walking difficulties are related to those problems. The patient was in fact evaluated by me in 2014 for the same problems at that time. MRI scan of the brain in October 2014  had shown only mild generalized atrophy and white matter changes. MRI of the C-spine and shown stable postoperative changes of anterior cervical fusion at C2-3 with mild degenerative changes without significant cord compression. He was advised to follow-up but was lost to follow-up. Patient states over the years his gait and balance have gotten worse. He has significant trouble getting up due to severe back pain and neck pain after he stood for a while the pain is bearable and he can walk but still feels his balance is off and he will fall. He was advised by me as well as his consultants to use a cane and walker at all times but he does not use it consistently. He also has trouble sleeping because of pain and except every couple of hours with severe hip pain and needs to stretch his back and legs to feel better. He denies any tingling numbness or burning in his feet.  Occasionally numbness in his hand but is driving and is resting his arm in a certain position. denies any headache, memory loss, bladder incontinence problems. Review of his prior medical records do not reveal workup for neuropathy in the past.  ROS:   14 system review of systems is positive for back pain, leg pain, knee pain, gait imbalance, weakness, neck pain, walking difficulty, itching and all other systems negative PMH:  Past Medical History:  Diagnosis Date  . Arthritis   . CAD (coronary artery disease)    cath 3/10: mLAD 30%, pRCA 30%, EF 55-60% Dr. Harrington Challenger cardiologist  most recent  stress stress done ~ 2 years ago with Dr. Harrington Challenger  . Complication of anesthesia    "he usually gets an ileus after back OR"   . Depression   . Dyspnea    -PFTs compeltely normal 03/20/08 including DLc0  . Environmental allergies    Dust, Smoke  . GERD (gastroesophageal reflux disease)   . HTN (hypertension)   . Hypothyroidism   . Low testosterone   . Observed sleep apnea    can't wear cpap  . Polycythemia   . PONV (postoperative nausea and vomiting)   . Pulmonary nodule 06/13/2012  . Rosacea   . Sleep apnea    Most recent sleep study 2010; records at [facility] Austin office    Social History:  Social History   Social History  . Marital status: Married    Spouse name: N/A  .  Number of children: 3  . Years of education: N/A   Occupational History  . retired [employer] History Main Topics  . Smoking status: Former Smoker    Quit date: 07/17/1974  . Smokeless tobacco: Former Systems developer  . Alcohol use No  . Drug use: No  . Sexual activity: Not on file   Other Topics Concern  . Not on file   Social History Narrative   Area, 3 children.   Retired from the city of [place]   5 caffeinated beverages daily    Medications:   Current Outpatient Prescriptions on File Prior to Visit  Medication Sig Dispense Refill  . aspirin EC 81 MG tablet Take 1 tablet (81 mg total) by mouth 2  (two) times daily.    . hydrochlorothiazide (HYDRODIURIL) 25 MG tablet Take 25 mg by mouth daily.     [name] levothyroxine (SYNTHROID, LEVOTHROID) 150 MCG tablet Take 150 mcg by mouth daily before breakfast.    . losartan (COZAAR) 50 MG tablet Take 50 mg by mouth daily.    [name] omeprazole (PRILOSEC) 40 MG capsule Take 1 capsule (40 mg total) by mouth daily before breakfast. 30 minutes before 90 capsule 3  . simvastatin (ZOCOR) 20 MG tablet TAKE 1 TABLET BY MOUTH  DAILY AT 6 PM. 90 tablet 3  . Trospium Chloride 60 MG CP24 Take 60 mg by mouth daily.     No current facility-administered medications on file prior to visit.     Allergies:   Allergies  Allergen Reactions  . Penicillins Hives and Other (See Comments)    "I break out in welts" Has patient had a PCN reaction causing immediate rash, facial/tongue/throat swelling, SOB or lightheadedness with hypotension: No Has patient had a PCN reaction causing severe rash involving mucus membranes or skin necrosis: No Has patient had a PCN reaction that required hospitalization No Has patient had a PCN reaction occurring within the last 10 years: No If all of the above answers are "NO", then may proceed with Cephalosporin use.  . Codeine Nausea And Vomiting  . Sulfonamide Derivatives Nausea And Vomiting    Physical Exam General: Obese middle-age Caucasian male, seated, in no evident distress Head: head normocephalic and atraumatic.   Neck: supple with no carotid or supraclavicular bruits Cardiovascular: regular rate and rhythm, no murmurs Musculoskeletal: no deformity but left shoulder elevation limited due to pain. Bilateral knee surgical scars from replacement. Skin:  no rash/petichiae Vascular:  Normal pulses all extremities  Neurologic Exam Mental Status: Awake and fully alert. Oriented to place and time. Recent and remote memory intact. Attention span, concentration and fund of knowledge appropriate. Mood and affect appropriate.  Cranial  Nerves: Fundoscopic exam reveals sharp disc margins. Pupils equal, briskly reactive to light. Extraocular movements full without nystagmus. Visual fields full to confrontation. Hearing intact. Facial sensation intact. Face, tongue, palate moves normally and symmetrically.  Motor: Normal bulk and tone. Normal strength in all tested extremity musclesLeft shoulder muscle testing limited due to chronic pain.. Sensory.: intact to touch , pinprick  sensation. Diminished vibration sensation from ankle down bilaterally. Position sense appears preserved and Romberg's sign is positive. Coordination: Rapid alternating movements normal in all extremities. Finger-to-nose and heel-to-shin performed accurately bilaterally. Gait and Station: Arises from chair with slight difficulty and failure since back. Stance is wide-based. Gait demonstrates broadbase with mild ataxia and needs to hold on to support.  Reflexes: 1+ and symmetric except right ankle jerk is depressed.. Toes downgoing.  ASSESSMENT: 58 year obese Caucasian male with several year history of chronic gait and balance difficulties likely multifactorial due to combination of degenerative spine and knee disease with chronic pain and underlying mild peripheral neuropathy.    PLAN: I had a long discussion with the patient and his wife regarding his long-standing gait difficulties which appear to be multifactorial due to combination of degenerative spine and knee disease and pain and likely underlying mild neuropathy. Check peripheral neuropathy panel labs and no conduction EMG study. I have counseled the patient on fall and safety precautions and to use a wheeled walker with a seat  for help with walking and balance. Greater than 50% time during this 45 minute consultation visit was spent on counseling and coordination of care about his chronic gait and balance difficulties Patient may return for follow-up in the future only as needed. Continue  outpatient follow-up with his orthopedic surgeon and neurosurgeon for chronic back and knee pain Antony Contras, MD  [facility] [address] [place], [place] [address]  Phone [phone] Fax [phone] Note: This document was prepared with digital dictation and possible smart phrase technology. Any transcriptional errors that result from this process are unintentional.

## 2016-01-27 NOTE — Patient Instructions (Signed)
I had a long discussion with the patient and his wife regarding his long-standing gait difficulties which appear to be multifactorial due to combination of degenerative spine and knee disease and pain and likely underlying mild neuropathy. Check peripheral neuropathy panel labs and no conduction EMG study. I have counseled the patient on fall and safety precautions and to use a wheeled walker with a seat and oriented 2) walking and balance. Patient may return for follow-up in the future only as needed. Continue outpatient follow-up with his orthopedic surgeon and neurosurgeon for chronic back and knee pain  Fall Prevention in the Home  Falls can cause injuries and can affect people from all age groups. There are many simple things that you can do to make your home safe and to help prevent falls. WHAT CAN I DO ON THE OUTSIDE OF MY HOME?  Regularly repair the edges of walkways and driveways and fix any cracks.  Remove high doorway thresholds.  Trim any shrubbery on the main path into your home.  Use bright outdoor lighting.  Clear walkways of debris and clutter, including tools and rocks.  Regularly check that handrails are securely fastened and in good repair. Both sides of any steps should have handrails.  Install guardrails along the edges of any raised decks or porches.  Have leaves, snow, and ice cleared regularly.  Use sand or salt on walkways during winter months.  In the garage, clean up any spills right away, including grease or oil spills. WHAT CAN I DO IN THE BATHROOM?  Use night lights.  Install grab bars by the toilet and in the tub and shower. Do not use towel bars as grab bars.  Use non-skid mats or decals on the floor of the tub or shower.  If you need to sit down while you are in the shower, use a plastic, non-slip stool.[name]  Keep the floor dry. Immediately clean up any water that spills on the floor.  Remove soap buildup in the tub or shower on a regular  basis.  Attach bath mats securely with double-sided non-slip rug tape.  Remove throw rugs and other tripping hazards from the floor. WHAT CAN I DO IN THE BEDROOM?  Use night lights.  Make sure that a bedside light is easy to reach.  Do not use oversized bedding that drapes onto the floor.  Have a firm chair that has side arms to use for getting dressed.  Remove throw rugs and other tripping hazards from the floor. WHAT CAN I DO IN THE KITCHEN?   Clean up any spills right away.  Avoid walking on wet floors.  Place frequently used items in easy-to-reach places.  If you need to reach for something above you, use a sturdy step stool that has a grab bar.  Keep electrical cables out of the way.  Do not use floor polish or wax that makes floors slippery. If you have to use wax, make sure that it is non-skid floor wax.  Remove throw rugs and other tripping hazards from the floor. WHAT CAN I DO IN THE STAIRWAYS?  Do not leave any items on the stairs.  Make sure that there are handrails on both sides of the stairs. Fix handrails that are broken or loose. Make sure that handrails are as long as the stairways.  Check any carpeting to make sure that it is firmly attached to the stairs. Fix any carpet that is loose or worn.  Avoid having throw rugs at the top or  bottom of stairways, or secure the rugs with carpet tape to prevent them from moving.  Make sure that you have a light switch at the top of the stairs and the bottom of the stairs. If you do not have them, have them installed. WHAT ARE SOME OTHER FALL PREVENTION TIPS?  Wear closed-toe shoes that fit well and support your feet. Wear shoes that have rubber soles or low heels.  When you use a stepladder, make sure that it is completely opened and that the sides are firmly locked. Have someone hold the ladder while you are using it. Do not climb a closed stepladder.  Add color or contrast paint or tape to grab bars and handrails  in your home. Place contrasting color strips on the first and last steps.  Use mobility aids as needed, such as canes, walkers, scooters, and crutches.  Turn on lights if it is dark. Replace any light bulbs that burn out.  Set up furniture so that there are clear paths. Keep the furniture in the same spot.  Fix any uneven floor surfaces.  Choose a carpet design that does not hide the edge of steps of a stairway.  Be aware of any and all pets.  Review your medicines with your healthcare provider. Some medicines can cause dizziness or changes in blood pressure, which increase your risk of falling. Talk with your health care provider about other ways that you can decrease your risk of falls. This may include working with a physical therapist or trainer to improve your strength, balance, and endurance.   This information is not intended to replace advice given to you by your health care provider. Make sure you discuss any questions you have with your health care provider.   Document Released: 06/04/2002 Document Revised: 10/29/2014 Document Reviewed: 07/19/2014 Elsevier Interactive Patient Education [employer].

## 2016-01-29 LAB — NEUROPATHY PANEL
A/G RATIO SPE: 1.1 (ref 0.7–1.7)
ALBUMIN ELP: 4.1 g/dL (ref 2.9–4.4)
ALPHA 1: 0.2 g/dL (ref 0.0–0.4)
ALPHA 2: 0.7 g/dL (ref 0.4–1.0)
ANGIO CONVERT ENZYME: 40 U/L (ref 14–82)
Anti Nuclear Antibody(ANA): POSITIVE — AB
Beta: 1.1 g/dL (ref 0.7–1.3)
Gamma Globulin: 1.8 g/dL (ref 0.4–1.8)
Globulin, Total: 3.7 g/dL (ref 2.2–3.9)
PDF: 0
SED RATE: 12 mm/h (ref 0–30)
TOTAL PROTEIN: 7.8 g/dL (ref 6.0–8.5)
TSH: 0.779 u[IU]/mL (ref 0.450–4.500)
VITAMIN B 12: 465 pg/mL (ref 211–946)
Vit D, 25-Hydroxy: 32 ng/mL (ref 30.0–100.0)

## 2016-01-29 LAB — HEMOGLOBIN A1C
ESTIMATED AVERAGE GLUCOSE: 126 mg/dL
Hgb A1c MFr Bld: 6 % — ABNORMAL HIGH (ref 4.8–5.6)

## 2016-02-03 ENCOUNTER — Telehealth: Payer: Self-pay | Admitting: *Deleted

## 2016-02-03 NOTE — Telephone Encounter (Signed)
Per Dr Leonie Man, spoke with patient 's wife and informed her that his neuropathy panel labs were normal except for positive ANA which is often a nonspecific finding. She stated "so there's nothing to be done about that one??" advised her there is nothing to be done at this time. She verbalized understanding, appreciation.

## 2016-02-05 ENCOUNTER — Other Ambulatory Visit: Payer: Self-pay | Admitting: Orthopedic Surgery

## 2016-02-05 DIAGNOSIS — M25512 Pain in left shoulder: Secondary | ICD-10-CM

## 2016-02-11 ENCOUNTER — Telehealth: Payer: Self-pay | Admitting: Genetic Counselor

## 2016-02-12 NOTE — Telephone Encounter (Signed)
Discussed with [name] [name] that his genetic test results were negative for any pathogenic mutations within any of 33 genes on a Custom Panel through [facility] that would cause him to be at an increased risk for colon polyps, colon cancer, prostate, male breast, and other related cancers.  Additionally, no uncertain changes were found.  Discussed that this is likely a reassuring result for [name] and a true negative result for the MUTYH mutation that his sister reportedly has.  He should continue to follow his doctors' recommendations for screening in the future.  His daughter should be getting annual mammograms currently due to the history of breast cancer in the family.  [name] other siblings still have a 50% chance of having inherited the familial MUTYH mutation, so they need to have testing as well.  [name] is happy to receive this news.  He is welcome to call with any questions.  He would like to have a copy of his result mailed to him and I am happy to do that.

## 2016-02-13 ENCOUNTER — Ambulatory Visit: Payer: Self-pay | Admitting: Genetic Counselor

## 2016-02-13 DIAGNOSIS — Z809 Family history of malignant neoplasm, unspecified: Secondary | ICD-10-CM

## 2016-02-13 DIAGNOSIS — Z1379 Encounter for other screening for genetic and chromosomal anomalies: Secondary | ICD-10-CM

## 2016-02-13 DIAGNOSIS — Z8489 Family history of other specified conditions: Secondary | ICD-10-CM

## 2016-02-13 DIAGNOSIS — Z803 Family history of malignant neoplasm of breast: Secondary | ICD-10-CM

## 2016-02-14 ENCOUNTER — Ambulatory Visit
Admission: RE | Admit: 2016-02-14 | Discharge: 2016-02-14 | Disposition: A | Discharge status: Home / Self Care | Payer: Medicare Other | Source: Ambulatory Visit | Attending: Orthopedic Surgery | Admitting: Orthopedic Surgery

## 2016-02-14 DIAGNOSIS — M25512 Pain in left shoulder: Secondary | ICD-10-CM

## 2016-02-17 ENCOUNTER — Telehealth: Payer: Self-pay | Admitting: Internal Medicine

## 2016-02-17 NOTE — Telephone Encounter (Signed)
Walk in pt [name]-clearance form dropped off-Michalene back Wednesday will give to her then.

## 2016-02-19 ENCOUNTER — Telehealth: Payer: Self-pay | Admitting: Internal Medicine

## 2016-02-19 NOTE — Telephone Encounter (Signed)
Signed pre operative clearance placed in nurse fax bin in medical records to be faxed to [place].  Called patient and informed.  He is appreciative for the call.

## 2016-02-19 NOTE — Telephone Encounter (Signed)
Follow Up:    Pt checking on the status of his clearance,also does he need an appointment to be cleared?

## 2016-02-27 ENCOUNTER — Ambulatory Visit (INDEPENDENT_AMBULATORY_CARE_PROVIDER_SITE_OTHER): Payer: Medicare Other | Admitting: Neurology

## 2016-02-27 ENCOUNTER — Ambulatory Visit (INDEPENDENT_AMBULATORY_CARE_PROVIDER_SITE_OTHER): Payer: Self-pay | Admitting: Neurology

## 2016-02-27 DIAGNOSIS — Z0289 Encounter for other administrative examinations: Secondary | ICD-10-CM

## 2016-02-27 DIAGNOSIS — R202 Paresthesia of skin: Secondary | ICD-10-CM

## 2016-02-27 DIAGNOSIS — R269 Unspecified abnormalities of gait and mobility: Secondary | ICD-10-CM

## 2016-02-27 NOTE — Progress Notes (Signed)
Nerve conduction EMG study report is under the procedure section

## 2016-02-27 NOTE — Progress Notes (Signed)
EMG nerve conduction study report is under the procedure section

## 2016-02-27 NOTE — Procedures (Signed)
   NCS (NERVE CONDUCTION STUDY) WITH EMG (ELECTROMYOGRAPHY) REPORT   STUDY DATE: February 27 2016 PATIENT NAME: [name] [name] DOB: Jul 24, 1945 MRN: [id number]    TECHNOLOGIST: Laretta Alstrom ELECTROMYOGRAPHER: Marcial Pacas M.D.  CLINICAL INFORMATION: 70 years old male with known history of cervical spondylitic myelopathy, chronic low back pain, history of cervical and lumbar decompression surgery in the past presented with persistent gait abnormality, bilateral feet paresthesia.  FINDINGS: NERVE CONDUCTION STUDY: Bilateral peroneal sensory responses were normal. Bilateral peroneal to EDB and tibial motor responses were normal. Bilateral tibial H reflexes were present and symmetric.  NEEDLE ELECTROMYOGRAPHY: Selective needle examinations were performed at bilateral lower extremity muscles bilateral lumbar sacral paraspinal muscles.  Bilateral tibialis anterior, peroneal longus, medial gastrocnemius: Normal insertion activity, no spontaneous activity, mildly enlarged simple morphology motor unit potential was mildly decreased recruitment patterns.  Bilateral vastus lateralis, biceps femoris long head: Normal insertion activity, no spontaneous activity, normal morphology motor unit potential, with slightly decreased recruitment patterns.  There was well-healed surgical scar along the mid line lumbar spine. There is no evidence of active denervation's at bilateral L4-5 S1.  IMPRESSION:   This is a mild abnormal study. There is evidence of chronic mild bilateral lumbar sacral radiculopathy, mainly involving bilateral L5-S1 myotomes. There is no evidence of active process. There is no electrodiagnostic evidence of large fiber peripheral neuropathy.   INTERPRETING PHYSICIAN:   Marcial Pacas M.D. Ph.D. [facility] [address] [place], [place] [address] [phone]

## 2016-03-07 DIAGNOSIS — Z1379 Encounter for other screening for genetic and chromosomal anomalies: Secondary | ICD-10-CM | POA: Insufficient documentation

## 2016-03-08 ENCOUNTER — Telehealth: Payer: Self-pay | Admitting: Neurology

## 2016-03-08 NOTE — Telephone Encounter (Signed)
Patient is calling to get NCV/EMG results.

## 2016-03-08 NOTE — Progress Notes (Signed)
GENETIC TEST RESULT  HPI: Mr. Oak was previously seen in the [facility] due to a family history of pathogenic MUTYH mutation (first identified in one of his sisters), family history of colon polyps, family history of breast and other cancers, and concerns regarding a hereditary predisposition to cancer. Please refer to our prior cancer genetics clinic note from January 08, 2016 for more information regarding Mr. Duford medical, social and family histories, and our assessment and recommendations, at the time. Mr. Kelley recent genetic test results were disclosed to him, as were recommendations warranted by these results. These results and recommendations are discussed in more detail below.  GENETIC TEST RESULTS: At the time of Mr. Lina visit on 01/08/16 we recommended he pursue genetic testing of the 33-gene Custom Panel through [employer].  This Custom Panel offered by [facility] includes sequencing and/or deletion duplication testing of the following 33 genes: APC, ATM, AXIN2, BARD1, BMPR1A, BRCA1, BRCA2, BRIP1, CDH1, CDK4, CDKN2A, CHEK2, EPCAM, FANCC, HOXB13, MLH1, MSH2, MSH6, MUTYH, NBN, PALB2, PMS2, POLD1, POLE, PTEN, RAD51C, RAD51D, SCG5/GREM1, SMAD4, STK11, TP53, VHL, and XRCC2.  Those results are now back, the report date for which is February 09, 2016.  Genetic testing was normal, and did not reveal a deleterious mutation in these genes.  Additionally, no variants of uncertain significance (VUSes) were found.  The test report will be scanned into EPIC and will be located under the Results Review tab in the Pathology>Molecular Pathology section.   We discussed with Mr. Sondgeroth that since the current genetic testing is not perfect, it is possible there may be a gene mutation in one of these genes that current testing cannot detect, but that chance is small. We also discussed, that it is possible that another gene that has not yet been discovered, or that we have  not yet tested, is responsible for the cancer diagnoses in the family, and it is, therefore, important to remain in touch with cancer genetics in the future so that we can continue to offer Mr. Tabet the most up-to-date genetic testing.    CANCER SCREENING RECOMMENDATIONS: This normal result is reassuring and indicates that Mr. Myler does not likely have an increased risk of cancer or colon polyps due to a mutation in one of these genes.  We, therefore, recommended  Mr. Dettman continue to follow the cancer screening guidelines provided by his primary healthcare providers.   RECOMMENDATIONS FOR FAMILY MEMBERS: Women in this family might be at some increased risk of developing breast cancer, over the general population risk, simply due to the family history of breast cancer. We recommended women in this family, including Mr. Fedewa daughter have a yearly mammogram beginning at age 63, or 49 years younger than the earliest onset of cancer, an annual clinical breast exam, and perform monthly breast self-exams. Thus, Mr. Rosenberry daughter can likely be getting her mammograms now (based on her aunt's diagnosis of breast cancer in her 80s).  Women in this family should also have a gynecological exam as recommended by their primary provider. All family members should have a colonoscopy by age 37, or younger based on positive MUTYH mutation status or based on personal history.  Based on Mr. Sippel family history, we recommended his other siblings who have not yet had genetic testing for the familial MUTYH mutation have genetic counseling and testing.  It is likely that these family members will need more comprehensive testing due to the additional family history of breast cancer.  All  maternal and paternal relatives should be informed of the familial MUTYH mutation and they should be made aware that genetic counseling and testing is available to them.  It may be more likely that the MUTYH mutation is  coming from the maternal family, since Mr. Elman mother had a history of 20+ colon polyps.  Mr. Behrens wife was actually diagnosed with colon cancer at the age of 109, so we recommended that she come back in for genetic counseling and testing as well.  Mr. Guerreiro will let us know if we can be of any assistance in coordinating genetic counseling and/or testing for these family members.  For family members who live in other cities, they can use the website of the [employer] (VariantTest.com.ee) to locate a cancer Dietitian by zip code.    FOLLOW-UP: Lastly, we discussed with Mr. Wares that cancer genetics is a rapidly advancing field and it is possible that new genetic tests will be appropriate for him and/or his family members in the future. We encouraged him to remain in contact with cancer genetics on an annual basis so we can update his personal and family histories and let him know of advances in cancer genetics that may benefit this family.   Our contact number was provided. Mr. Verdell questions were answered to his satisfaction, and she knows he is welcome to call us at anytime with additional questions or concerns.   Jeanine Luz, MS, [facility] Certified Genetic Counselor [email or web address] Phone: [phone]

## 2016-03-08 NOTE — Telephone Encounter (Signed)
Rn call patient about his pinch nerve. 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 Its likely old from prior surgery. No new or worrisome findings. PT verbalized understanding,and wanted does Dr. Leonie Man recommended him seeing his back surgery. Rn stated a message will be sent to Dr.Sethi about his recommendations.

## 2016-03-08 NOTE — Telephone Encounter (Signed)
Rn  spoke with patient about if he needs to see his back surgery. Rn stated per Dr.Sethi note if his symptoms get worse he should contact his back MD. PT stated he Is having problems now walking at times. Rn stated a copy can be sent to Earleen Newport Md at [facility].of his EMG.

## 2016-03-08 NOTE — Telephone Encounter (Signed)
Yes if his   symptoms are getting worse

## 2016-03-23 ENCOUNTER — Encounter: Payer: Self-pay | Admitting: Physician Assistant

## 2016-03-23 NOTE — Progress Notes (Signed)
Cardiology Office Note    Date:  03/25/2016   ID:  OTTAVIO BITZ, DOB [birth year], MRN [id number]  PCP:  [name], MD  Cardiologist: Dr. Harrington Challenger  Chief Complaint: follow up for aneurysm  History of Present Illness:   [name] [name] is a 70 y.o. male with hx of nonobstructive CAD, GERD, HTN, HL, OSA who presented for follow up.   Cath in March 2010, he had a mid LAD 30% and proximal RCA 30% with an EF of 55-60%. Myoview prior to his cardiac catheterization 2/10 demonstrated inferior scar with mild to moderate peri-infarct ischemia. Echo 06/2014 showed EF of 55%.  Admitted 08/2015 with CP which felt atypical, prob MSK. Positive d-dimer. CT angio negative for PE-->ascending thoracic aorta with a maximum transverse diameter of 4.1 x 4.1 cm. Seen by GI and started on PPI.   Last seen by Dr. Harrington Challenger 10/27/15. He was doing well on cardiac stand point except LE edema and advised to cut back on salt. Recently had back surgery.   Today here for follow up. The patient is going for Vacation [facility] Nor 27 to Dec 23. Patient is very anxious about his thoracic aorta prior to vacation. The patient denies nausea, vomiting, fever, chest pain, palpitations, shortness of breath, orthopnea, PND, dizziness, syncope, cough, congestion, abdominal pain, hematochezia, melena, lower extremity edema. Intermittent unsteady gait due to back and knee sugary.   Past Medical History:  Diagnosis Date  . Arthritis   . CAD (coronary artery disease)    cath 3/10: mLAD 30%, pRCA 30%, EF 55-60% Dr. Harrington Challenger cardiologist  most recent  stress stress done ~ 2 years ago with Dr. Harrington Challenger  . Complication of anesthesia    "he usually gets an ileus after back OR"   . Depression   . Dyspnea    -PFTs compeltely normal 03/20/08 including DLc0  . Environmental allergies    Dust, Smoke  . GERD (gastroesophageal reflux disease)   . HTN (hypertension)   . Hypothyroidism   . Low testosterone   . Observed sleep apnea    can't wear  cpap  . Polycythemia   . PONV (postoperative nausea and vomiting)   . Pulmonary nodule 06/13/2012  . Rosacea   . Sleep apnea    Most recent sleep study 2010; records at [facility]    Past Surgical History:  Procedure Laterality Date  . BACK SURGERY  2005; 06/2006; 01/2007; 04/2009  . CARDIAC CATHETERIZATION  08/2008  . CATARACT EXTRACTION, BILATERAL  2010  . CERVICAL FUSION  04/2002   ?C3-4  . COLONOSCOPY  12/2005   normal screening study.   . ESOPHAGOGASTRODUODENOSCOPY  multiple   last 07/2012 GERD esophagitis, 48 Fr dilation  . ESOPHAGOGASTRODUODENOSCOPY N/A 09/19/2015   Procedure: ESOPHAGOGASTRODUODENOSCOPY (EGD);  Surgeon: [name], MD;  Location[facility] ENDOSCOPY;  Service: Endoscopy;  Laterality: N/A;  . KNEE ARTHROSCOPY WITH MEDIAL MENISECTOMY Right 05/14/2015   Procedure: RIGHT KNEE ARTHROSCOPY CHONDROPLASTY, PARTIAL  MEDIAL MENISECTOMY;  Surgeon: [name], MD;  Location: [place];  Service: Orthopedics;  Laterality: Right;  . LUMBAR DISC SURGERY  06/2006   L3  . MENISCUS REPAIR  08/2008   left  . POSTERIOR FUSION LUMBAR SPINE  2005; 01/2007; 04/2009   L5; L3-4; L2-3  . PROSTATE SURGERY  2000  . SAVORY DILATION N/A 09/19/2015   Procedure: SAVORY DILATION;  Surgeon: [name], MD;  Location: [facility] ENDOSCOPY;  Service: Endoscopy;  Laterality: N/A;  . SHOULDER OPEN  ROTATOR CUFF REPAIR Left 1999   left  . TONSILLECTOMY  09/21/11  . TOTAL KNEE ARTHROPLASTY Left 01/2011   left  . TOTAL KNEE ARTHROPLASTY Right 08/08/2015   Procedure: RIGHT TOTAL KNEE ARTHROPLASTY;  Surgeon: [name], MD;  Location: [place];  Service: Orthopedics;  Laterality: Right;  . TRANSURETHRAL RESECTION OF PROSTATE  2006   followed by "surgery to get rid of clots"  . UVULOPALATOPHARYNGOPLASTY, TONSILLECTOMY AND SEPTOPLASTY  09/21/11   Deviated Septum    Current Medications: Prior to Admission medications   Medication Sig Start Date End Date Taking? Authorizing Provider   aspirin EC 81 MG tablet Take 1 tablet (81 mg total) by mouth 2 (two) times daily. 10/03/15   Thurnell Lose, MD  hydrochlorothiazide (HYDRODIURIL) 25 MG tablet Take 25 mg by mouth daily.     Historical Provider, MD  ibuprofen (ADVIL,MOTRIN) 100 MG tablet Take 100 mg by mouth every 6 (six) hours as needed for fever.    Historical Provider, MD  levothyroxine (SYNTHROID, LEVOTHROID) 150 MCG tablet Take 150 mcg by mouth daily before breakfast.    Historical Provider, MD  losartan (COZAAR) 50 MG tablet Take 50 mg by mouth daily.    Historical Provider, MD  omeprazole (PRILOSEC) 40 MG capsule Take 1 capsule (40 mg total) by mouth daily before breakfast. 30 minutes before 09/19/15   Thurnell Lose, MD  polyethylene glycol (MIRALAX) 0.34 gm/ml SOLN Take by mouth.    Historical Provider, MD  simvastatin (ZOCOR) 20 MG tablet TAKE 1 TABLET BY MOUTH  DAILY AT 6 PM. 01/07/16   Burtis Junes, NP  Trospium Chloride 60 MG CP24 Take 60 mg by mouth daily.    Historical Provider, MD    Allergies:   Penicillins; Codeine; and Sulfonamide derivatives   Social History   Social History  . Marital status: Married    Spouse name: N/A  . Number of children: 3  . Years of education: N/A   Occupational History  . retired [place] History Main Topics  . Smoking status: Former Smoker    Quit date: 07/17/1974  . Smokeless tobacco: Former Systems developer  . Alcohol use No  . Drug use: No  . Sexual activity: Not Asked   Other Topics Concern  . None   Social History Narrative   Area, 3 children.   Retired from the city of [employer]   5 caffeinated beverages daily     Family History:  The patient's family history includes Alzheimer's disease in his cousin and father; Bipolar disorder in his other; Breast cancer in his mother and sister; Breast cancer (age of onset: 67) in his sister; Cirrhosis in his maternal uncle; Colon polyps in his mother; Diabetes in his mother and sister; Heart Problems in his  sister; Heart attack in his cousin, father, and paternal grandfather; Liver cancer in his maternal uncle; Lung cancer in his paternal uncle, paternal uncle, and paternal uncle; Other in his daughter and sister; Parkinson's disease in his other; Stroke in his paternal grandmother; Throat cancer in his maternal uncle.   ROS:   Please see the history of present illness.    ROS All other systems reviewed and are negative.   PHYSICAL EXAM:   VS:  BP (!) 102/54 (BP Location: Right Arm, Patient Position: Sitting, Cuff Size: Large)   Pulse 70   Ht 5\' 11"  (1.803 m)   Wt 267 lb 12.8 oz (121.5 kg)   BMI 37.35 kg/m  GEN: Well nourished, well developed, in no acute distress  HEENT: normal  Neck: no JVD, carotid bruits, or masses Cardiac: RRR; no murmurs, rubs, or gallops, Trace Right lower extremity edema  Respiratory:  clear to auscultation bilaterally, normal work of breathing GI: soft, nontender, nondistended, + BS MS: no deformity or atrophy  Skin: warm and dry, no rash Neuro:  Alert and Oriented x 3, Strength and sensation are intact Psych: euthymic mood, full affect  Wt Readings from Last 3 Encounters:  03/25/16 267 lb 12.8 oz (121.5 kg)  01/27/16 268 lb 6.4 oz (121.7 kg)  01/20/16 269 lb (122 kg)      Studies/Labs Reviewed:   EKG:  EKG is not  ordered today.    Recent Labs: 09/18/2015: ALT 14; BUN 19; Creatinine, Ser 0.82; Hemoglobin 14.0; Platelets 207; Potassium 4.0; Sodium 136 01/27/2016: TSH 0.779   Lipid Panel 07/16/15 by PCP: CHOL 121 , TRIG 111, HDL 32,  LDL 66  TSH 01/27/16 - 0.77 BUN/Scr 12/29/15 - 16/0.83  Additional studies/ records that were reviewed today include:   As above   ASSESSMENT & PLAN:   1. Non obstructive CAD - Myoview 2/10 demonstrated inferior scar with mild to moderate peri-infarct ischemia. Follow up cath in 08/2008 showed  mid LAD 30% and proximal RCA 30% with an EF of 55-60%.  Echo 06/2014 showed EF of 55%. No anginal pain. Continue ASA and  statin.   2. HTN - Stable. Continue current regimen  3. HLD - Followed by PCP. LDL 66.  4. Thoracic aneurysm on CTA of chest 08/2014 - The patient is very anxious about this especially wife. He is asymptomatic. Reasurrnace given. BP well controlled. He is non smoker. No diabetics. Repeat study as schedule in 2018. Advised lifestyle modification with loosing weight and healthy eating.   5. LE edema - mostly in R leg. Likely from R knee surgery. He is eating lot of sea food and salt. Advise to work on diet and cut back salt.    Medication Adjustments/Labs and Tests Ordered: Current medicines are reviewed at length with the patient today.  Concerns regarding medicines are outlined above.  Medication changes, Labs and Tests ordered today are listed in the Patient Instructions below. Patient Instructions  Medication Instructions:  Your physician recommends that you continue on your current medications as directed. Please refer to the Current Medication list given to you today.   Labwork: None ordered  Testing/Procedures: None ordered  Follow-Up: Your physician wants you to follow-up in: AS PLANNED ITH DR. ROSS   You will receive a reminder letter in the mail two months in advance. If you don't receive a letter, please call our office to schedule the follow-up appointment.   Any Other Special Instructions Will Be Listed Below (If Applicable).   If you need a refill on your cardiac medications before your next appointment, please call your pharmacy.      Jarrett Soho, [place]  03/25/2016 10:52 AM    [facility] [place], [place], [place]  [address] Phone: [phone]; Fax: [phone]

## 2016-03-25 ENCOUNTER — Ambulatory Visit (INDEPENDENT_AMBULATORY_CARE_PROVIDER_SITE_OTHER): Payer: Medicare Other | Admitting: Physician Assistant

## 2016-03-25 ENCOUNTER — Encounter: Payer: Self-pay | Admitting: Physician Assistant

## 2016-03-25 ENCOUNTER — Encounter (INDEPENDENT_AMBULATORY_CARE_PROVIDER_SITE_OTHER): Payer: Self-pay

## 2016-03-25 VITALS — BP 102/54 | HR 70 | Ht 71.0 in | Wt 267.8 lb

## 2016-03-25 DIAGNOSIS — I712 Thoracic aortic aneurysm, without rupture, unspecified: Secondary | ICD-10-CM

## 2016-03-25 DIAGNOSIS — I2510 Atherosclerotic heart disease of native coronary artery without angina pectoris: Secondary | ICD-10-CM | POA: Diagnosis not present

## 2016-03-25 DIAGNOSIS — R600 Localized edema: Secondary | ICD-10-CM

## 2016-03-25 DIAGNOSIS — E785 Hyperlipidemia, unspecified: Secondary | ICD-10-CM | POA: Diagnosis not present

## 2016-03-25 DIAGNOSIS — I10 Essential (primary) hypertension: Secondary | ICD-10-CM | POA: Diagnosis not present

## 2016-03-25 NOTE — Patient Instructions (Addendum)
Medication Instructions:  Your physician recommends that you continue on your current medications as directed. Please refer to the Current Medication list given to you today.   Labwork: None ordered  Testing/Procedures: None ordered  Follow-Up: Your physician wants you to follow-up in: AS PLANNED ITH DR. ROSS   You will receive a reminder letter in the mail two months in advance. If you don't receive a letter, please call our office to schedule the follow-up appointment.   Any Other Special Instructions Will Be Listed Below (If Applicable).   If you need a refill on your cardiac medications before your next appointment, please call your pharmacy.

## 2016-04-16 NOTE — Telephone Encounter (Signed)
Rn call [facility] and spoke with [place]. Rn stated a copy of the EMG was fax to Dr. Ellene Route. Jacqlyn Larsen was given the patients name and birthdate. Jacqlyn Larsen stated she does have a copy of the fax and  EMG results from [place]. Rn stated Counselling psychologist did not have the EMG results. Becky stated Caren Griffins was at lunch and she will notify her that the EMG results were available.

## 2016-04-16 NOTE — Telephone Encounter (Signed)
Rn call patients wife that the office does have the EMG results from 02/2016.Rn explain the fax number was verified and they had the copy and cover sheet.

## 2016-04-16 NOTE — Telephone Encounter (Signed)
Rn spoke with patients wife that the EMG was fax in September 2017. Rn stated it was fax twice and receive. Pts wife stated the [facility] did not have it. Rn stated the fax number was verified by the office. Rn stated it will be fax again.

## 2016-04-16 NOTE — Telephone Encounter (Signed)
Wife called to check status of NCS/EMG that was to be sent to Dr. Ellene Route. States their office has advised, they haven't received this yet.

## 2016-07-05 ENCOUNTER — Ambulatory Visit (INDEPENDENT_AMBULATORY_CARE_PROVIDER_SITE_OTHER): Payer: Medicare Other | Admitting: Internal Medicine

## 2016-07-05 ENCOUNTER — Encounter (INDEPENDENT_AMBULATORY_CARE_PROVIDER_SITE_OTHER): Payer: Self-pay

## 2016-07-05 ENCOUNTER — Encounter: Payer: Self-pay | Admitting: Internal Medicine

## 2016-07-05 VITALS — BP 110/72 | HR 72 | Ht 71.0 in | Wt 273.0 lb

## 2016-07-05 DIAGNOSIS — I2510 Atherosclerotic heart disease of native coronary artery without angina pectoris: Secondary | ICD-10-CM | POA: Diagnosis not present

## 2016-07-05 DIAGNOSIS — I10 Essential (primary) hypertension: Secondary | ICD-10-CM

## 2016-07-05 DIAGNOSIS — I77810 Thoracic aortic ectasia: Secondary | ICD-10-CM | POA: Diagnosis not present

## 2016-07-05 NOTE — Progress Notes (Signed)
Cardiology Office Note   Date:  07/05/2016   ID:  [name] [name], DOB 04/10/1946, MRN [id number]  PCP:  [name], MD  Cardiologist:   [name], MD   F/U OF CAD    History of Present Illness: [name] [name] is a 71 y.o. male with a history of  Nonobstructive CAD  LVEF normal CAD GERD, HTN, HL OSA, thoracic aneurysm  Mild 4.1 mm  Seen by Barbette Hair    Since seen he denies CP  Breathing is OK   No dizzienss      Current Meds  Medication Sig  . acetaminophen (TYLENOL) 650 MG CR tablet Take 650 mg by mouth at bedtime.  [name] aspirin EC 81 MG tablet Take 1 tablet (81 mg total) by mouth 2 (two) times daily.  . hydrochlorothiazide (HYDRODIURIL) 25 MG tablet Take 25 mg by mouth daily.   [name] ibuprofen (ADVIL,MOTRIN) 100 MG tablet Take 100 mg by mouth every 6 (six) hours as needed for fever.  . levothyroxine (SYNTHROID, LEVOTHROID) 150 MCG tablet Take 150 mcg by mouth daily before breakfast.  . losartan (COZAAR) 50 MG tablet Take 50 mg by mouth daily.  [name] omeprazole (PRILOSEC) 40 MG capsule Take 1 capsule (40 mg total) by mouth daily before breakfast. 30 minutes before  . polyethylene glycol (MIRALAX) 0.34 gm/ml SOLN Take by mouth.  . simvastatin (ZOCOR) 20 MG tablet TAKE 1 TABLET BY MOUTH  DAILY AT 6 PM.  . Trospium Chloride 60 MG CP24 Take 60 mg by mouth daily.     Allergies:   Penicillins; Codeine; and Sulfonamide derivatives   Past Medical History:  Diagnosis Date  . Arthritis   . CAD (coronary artery disease)    cath 3/10: mLAD 30%, pRCA 30%, EF 55-60% Dr. Harrington Challenger cardiologist  most recent  stress stress done ~ 2 years ago with Dr. Harrington Challenger  . Complication of anesthesia    "he usually gets an ileus after back OR"   . Depression   . Dyspnea    -PFTs compeltely normal 03/20/08 including DLc0  . Environmental allergies    Dust, Smoke  . GERD (gastroesophageal reflux disease)   . HTN (hypertension)   . Hypothyroidism   . Low testosterone   . Observed sleep apnea    can't wear cpap    . Polycythemia   . PONV (postoperative nausea and vomiting)   . Pulmonary nodule 06/13/2012  . Rosacea   . Sleep apnea    Most recent sleep study 2010; records at [facility]    Past Surgical History:  Procedure Laterality Date  . BACK SURGERY  2005; 06/2006; 01/2007; 04/2009  . CARDIAC CATHETERIZATION  08/2008  . CATARACT EXTRACTION, BILATERAL  2010  . CERVICAL FUSION  04/2002   ?C3-4  . COLONOSCOPY  12/2005   normal screening study.   . ESOPHAGOGASTRODUODENOSCOPY  multiple   last 07/2012 GERD esophagitis, 48 Fr dilation  . ESOPHAGOGASTRODUODENOSCOPY N/A 09/19/2015   Procedure: ESOPHAGOGASTRODUODENOSCOPY (EGD);  Surgeon: [name], MD;  Location: [facility] ENDOSCOPY;  Service: Endoscopy;  Laterality: N/A;  . KNEE ARTHROSCOPY WITH MEDIAL MENISECTOMY Right 05/14/2015   Procedure: RIGHT KNEE ARTHROSCOPY CHONDROPLASTY, PARTIAL  MEDIAL MENISECTOMY;  Surgeon: [name], MD;  Location: [place];  Service: Orthopedics;  Laterality: Right;  . LUMBAR DISC SURGERY  06/2006   L3  . MENISCUS REPAIR  08/2008   left  . POSTERIOR FUSION LUMBAR SPINE  2005; 01/2007; 04/2009   L5; L3-4; L2-3  .  PROSTATE SURGERY  2000  . SAVORY DILATION N/A 09/19/2015   Procedure: SAVORY DILATION;  Surgeon: [name], MD;  Location: [facility] ENDOSCOPY;  Service: Endoscopy;  Laterality: N/A;  . SHOULDER OPEN ROTATOR CUFF REPAIR Left 1999   left  . TONSILLECTOMY  09/21/11  . TOTAL KNEE ARTHROPLASTY Left 01/2011   left  . TOTAL KNEE ARTHROPLASTY Right 08/08/2015   Procedure: RIGHT TOTAL KNEE ARTHROPLASTY;  Surgeon: [name], MD;  Location: [place];  Service: Orthopedics;  Laterality: Right;  . TRANSURETHRAL RESECTION OF PROSTATE  2006   followed by "surgery to get rid of clots"  . UVULOPALATOPHARYNGOPLASTY, TONSILLECTOMY AND SEPTOPLASTY  09/21/11   Deviated Septum     Social History:  The patient  reports that he quit smoking about 41 years ago. He has quit using smokeless tobacco. He  reports that he does not drink alcohol or use drugs.   Family History:  The patient's family history includes Alzheimer's disease in his cousin and father; Bipolar disorder in his other; Breast cancer in his mother and sister; Breast cancer (age of onset: 9) in his sister; Cirrhosis in his maternal uncle; Colon polyps in his mother; Diabetes in his mother and sister; Heart Problems in his sister; Heart attack in his cousin, father, and paternal grandfather; Liver cancer in his maternal uncle; Lung cancer in his paternal uncle, paternal uncle, and paternal uncle; Other in his daughter and sister; Parkinson's disease in his other; Stroke in his paternal grandmother; Throat cancer in his maternal uncle.    ROS:  Please see the history of present illness. All other systems are reviewed and  Negative to the above problem except as noted.    PHYSICAL EXAM: VS:  BP 110/72 (BP Location: Left Arm, Patient Position: Sitting, Cuff Size: Large)   Pulse 72   Ht 5\' 11"  (1.803 m)   Wt 273 lb (123.8 kg)   BMI 38.08 kg/m   GEN: Well nourished, well developed, in no acute distress  HEENT: normal  Neck: no JVD, carotid bruits, or masses Cardiac: RRR; no murmurs, rubs, or gallops,no edema  Respiratory:  clear to auscultation bilaterally, normal work of breathing GI: soft, nontender, nondistended, + BS  No hepatomegaly  MS: no deformity Moving all extremities   Skin: warm and dry, no rash Neuro:  Strength and sensation are intact Psych: euthymic mood, full affect   EKG:  EKG is ordered today.  SR RBBB  LAFB    Lipid Panel No results found for: CHOL, TRIG, HDL, CHOLHDL, VLDL, LDLCALC, LDLDIRECT    Wt Readings from Last 3 Encounters:  07/05/16 273 lb (123.8 kg)  03/25/16 267 lb 12.8 oz (121.5 kg)  01/27/16 268 lb 6.4 oz (121.7 kg)      ASSESSMENT AND PLAN: 1  CAD  No symptoms to sugg angina   Keep on same meds   2  Aorta  Will get follow up CT in APril  Reassured patient  Thoracic aorta  minimally dilated  WIll get abdominal USN for abdominal aorta  3  Lipids  Pt to have blood work soon  Asked him to have them forwarded to me  4  HTN  Excellent control.   F/U n the fall      Current medicines are reviewed at length with the patient today.  The patient does not have concerns regarding medicines.  Signed, [name], MD  07/05/2016 8:12 AM    [place], [place]  [address] Phone: [phone]; Fax: [phone]

## 2016-07-05 NOTE — Patient Instructions (Addendum)
Your physician recommends that you continue on your current medications as directed. Please refer to the Current Medication list given to you today.  Your physician has requested that you have an abdominal aorta duplex.   During this test, an ultrasound is used to evaluate the aorta. Allow 30 minutes for this exam. Do not eat after midnight the day before and avoid carbonated beverages  PLEASE SCHEDULE A CHEST CT SCAN FOR April 2018, TO MONITOR YOUR THORACIC ANEURYSM.  Your physician wants you to follow-up in: [place].  You will receive a reminder letter in the mail two months in advance. If you don't receive a letter, please call our office to schedule the follow-up appointment.  TAKE THE PRESCRIPTION FOR LAB WORK TO YOUR PCP TO HAVE LABS DRAWN.  THE RESULTS SHOULD BE FAXED TO DR. Harrington Challenger.  [phone].

## 2016-09-21 ENCOUNTER — Other Ambulatory Visit: Payer: Self-pay | Admitting: Internal Medicine

## 2016-09-21 DIAGNOSIS — I77810 Thoracic aortic ectasia: Secondary | ICD-10-CM

## 2016-09-27 ENCOUNTER — Other Ambulatory Visit: Payer: Medicare Other | Admitting: *Deleted

## 2016-09-27 DIAGNOSIS — I77810 Thoracic aortic ectasia: Secondary | ICD-10-CM

## 2016-09-27 LAB — BASIC METABOLIC PANEL
BUN / CREAT RATIO: 18 (ref 10–24)
BUN: 16 mg/dL (ref 8–27)
CHLORIDE: 94 mmol/L — AB (ref 96–106)
CO2: 23 mmol/L (ref 18–29)
Calcium: 9.3 mg/dL (ref 8.6–10.2)
Creatinine, Ser: 0.88 mg/dL (ref 0.76–1.27)
GFR, EST AFRICAN AMERICAN: 101 mL/min/{1.73_m2} (ref >59)
GFR, EST NON AFRICAN AMERICAN: 88 mL/min/{1.73_m2} (ref >59)
Glucose: 96 mg/dL (ref 65–99)
POTASSIUM: 4.4 mmol/L (ref 3.5–5.2)
Sodium: 134 mmol/L (ref 134–144)

## 2016-10-01 ENCOUNTER — Telehealth: Payer: Self-pay | Admitting: *Deleted

## 2016-10-01 NOTE — Telephone Encounter (Signed)
Pt notified of lab results by phone with verbal understanding. Pt aware ok to proceed with CT on Monday 4/9. Pt thanked me for my call today.

## 2016-10-01 NOTE — Telephone Encounter (Signed)
-----   Message from Fay Records, MD sent at 09/27/2016  6:28 PM EDT ----- Electrolytes and kidney function are OK Pre CT

## 2016-10-04 ENCOUNTER — Ambulatory Visit (HOSPITAL_COMMUNITY)
Admission: RE | Admit: 2016-10-04 | Discharge: 2016-10-04 | Disposition: A | Discharge status: Home / Self Care | Payer: Medicare Other | Source: Ambulatory Visit | Attending: Cardiology | Admitting: Cardiology

## 2016-10-04 ENCOUNTER — Ambulatory Visit (INDEPENDENT_AMBULATORY_CARE_PROVIDER_SITE_OTHER)
Admission: RE | Admit: 2016-10-04 | Discharge: 2016-10-04 | Disposition: A | Discharge status: Home / Self Care | Payer: Medicare Other | Source: Ambulatory Visit | Attending: Internal Medicine | Admitting: Internal Medicine

## 2016-10-04 DIAGNOSIS — I10 Essential (primary) hypertension: Secondary | ICD-10-CM

## 2016-10-04 DIAGNOSIS — I77810 Thoracic aortic ectasia: Secondary | ICD-10-CM

## 2016-10-04 DIAGNOSIS — I700 Atherosclerosis of aorta: Secondary | ICD-10-CM | POA: Insufficient documentation

## 2016-10-04 DIAGNOSIS — I2510 Atherosclerotic heart disease of native coronary artery without angina pectoris: Secondary | ICD-10-CM | POA: Diagnosis not present

## 2016-10-04 MED ORDER — IOPAMIDOL (ISOVUE-370) INJECTION 76%
100.0000 mL | Freq: Once | INTRAVENOUS | Status: AC | PRN
  Administered 2016-10-04: 100 mL via INTRAVENOUS

## 2016-10-10 ENCOUNTER — Other Ambulatory Visit: Payer: Self-pay | Admitting: Nurse Practitioner

## 2016-12-08 ENCOUNTER — Encounter (HOSPITAL_COMMUNITY): Payer: Self-pay

## 2017-02-18 IMAGING — MR MR SHOULDER*L* W/O CM
5 series · 33 of 40 positions shown · non-contrast
Comparison: Limited correlation made with chest radiographs and
chest CT 09/18/2015.

CLINICAL DATA: 70-year-old with left shoulder pain and limited
range of motion for 7 years. Rotator cuff surgery in 5660. No recent
injury.

EXAM:
MRI OF THE LEFT SHOULDER WITHOUT CONTRAST
TECHNIQUE: Multiplanar, multisequence MR imaging of the shoulder was performed.
No intravenous contrast was administered.

[Series 4: T2 fat-sat · oblique · 4.0mm · 0.55mm/px · 9 of 23 slices shown (1 of 3)]
[im 1/23]
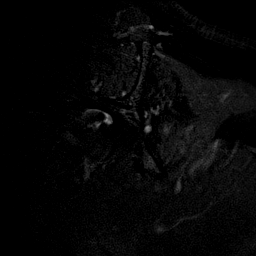
[im 3/23]
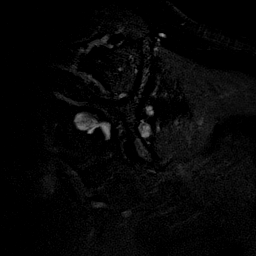
[im 6/23]
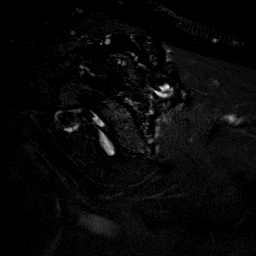
[im 9/23]
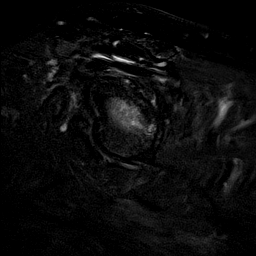
[im 12/23]
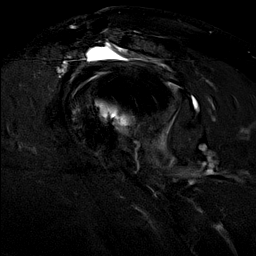
[im 14/23]
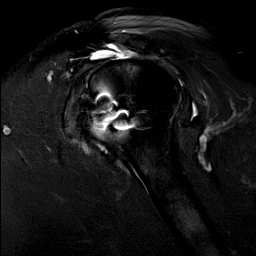
[im 17/23]
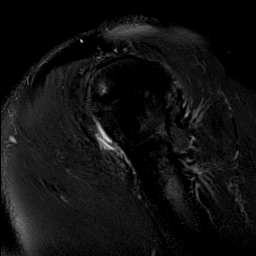
[im 20/23]
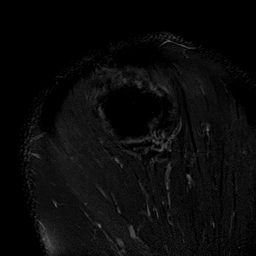
[im 23/23]
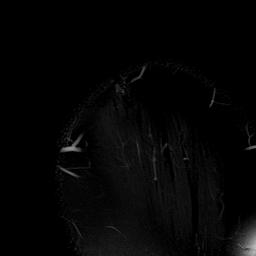

[Series 5: T1 · oblique · 4.0mm · 0.22mm/px · 1 of 23 slices shown]
[im 1/23]
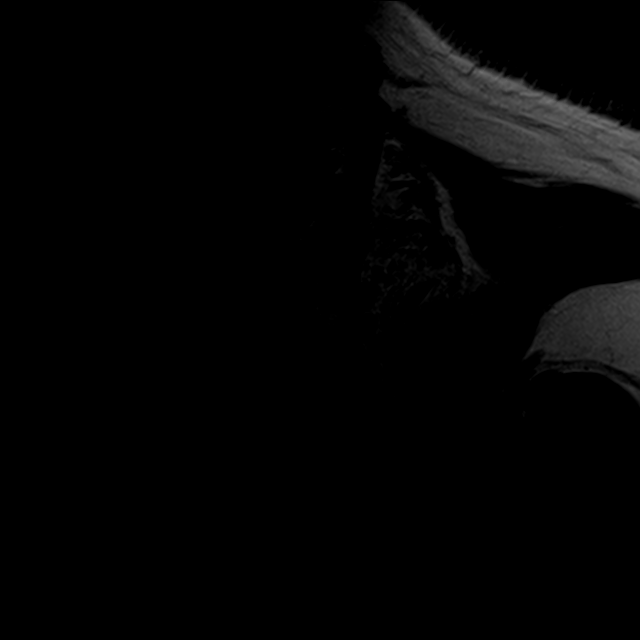

[Series 6: T2 fat-sat · oblique · 4.0mm · 0.27mm/px · 8 of 23 slices shown (2 of 3)]
[im 1/23]
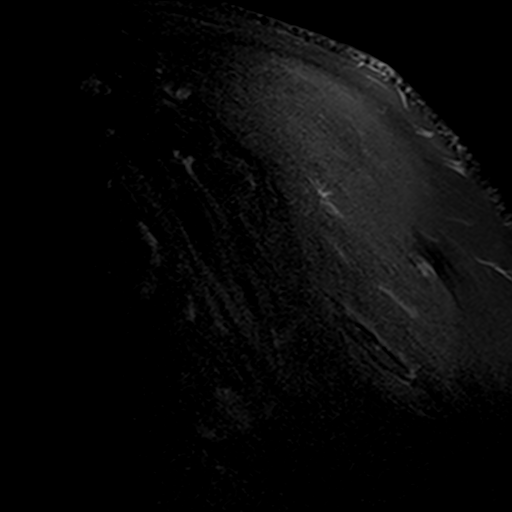
[im 4/23]
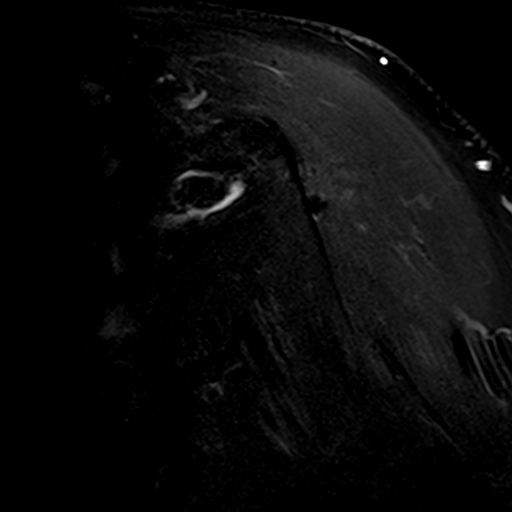
[im 7/23]
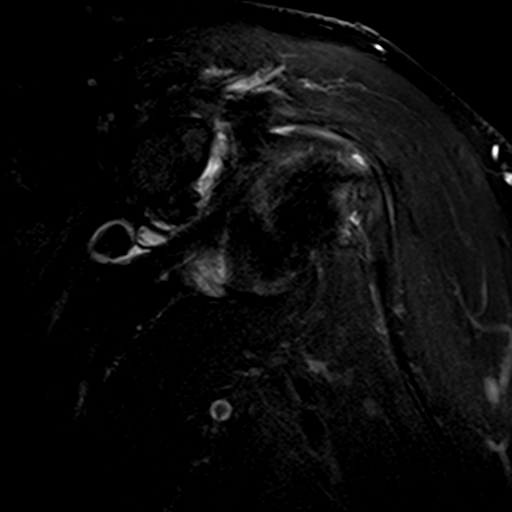
[im 10/23]
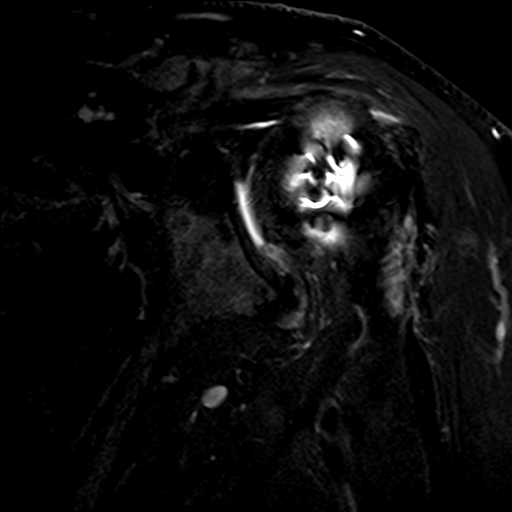
[im 13/23]
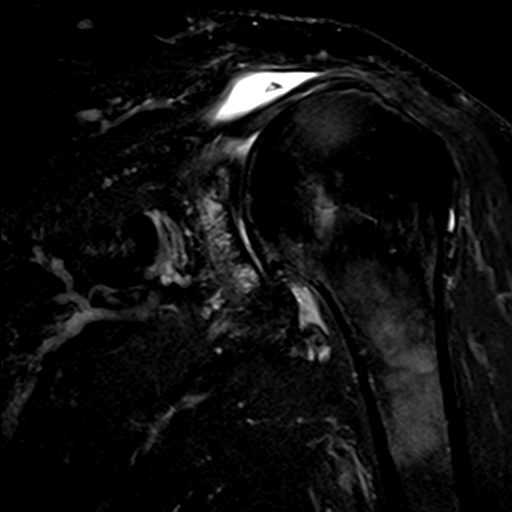
[im 16/23]
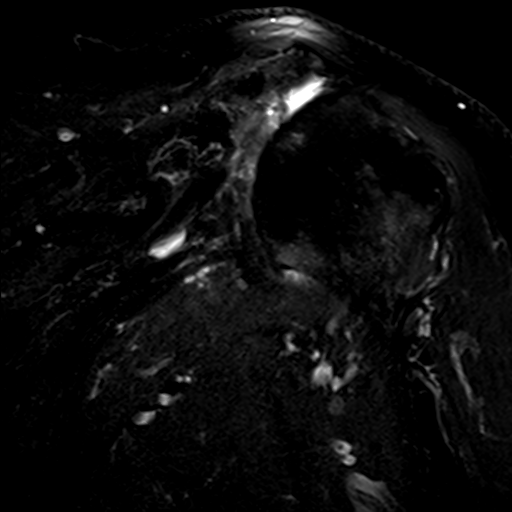
[im 19/23]
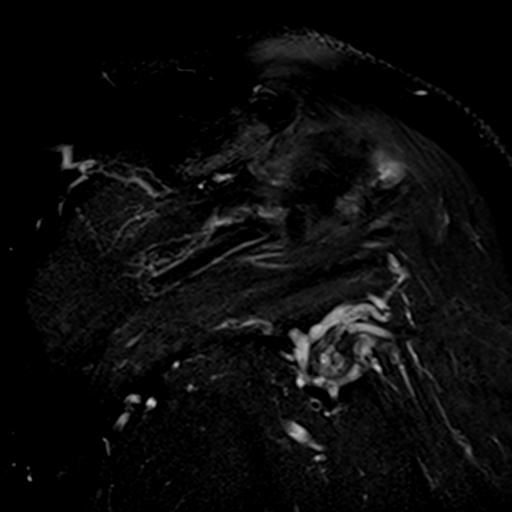
[im 23/23]
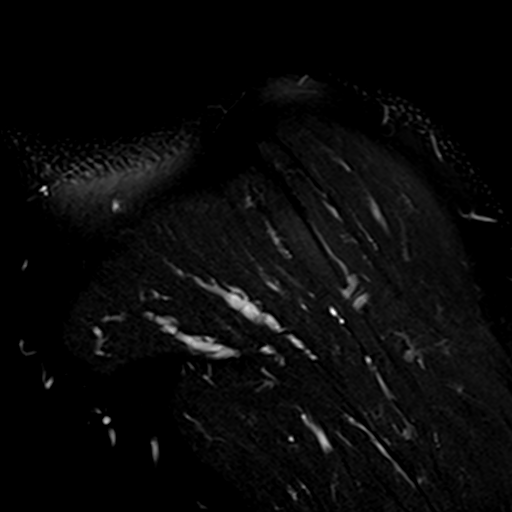

[Series 7: PD · oblique · 4.0mm · 0.44mm/px · 8 of 23 slices shown]
[im 1/23]
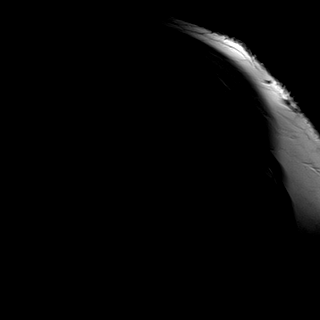
[im 4/23]
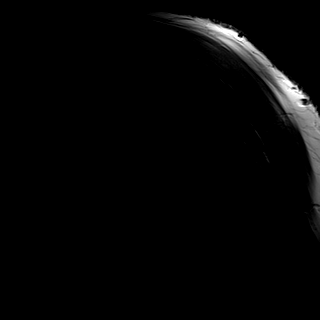
[im 7/23]
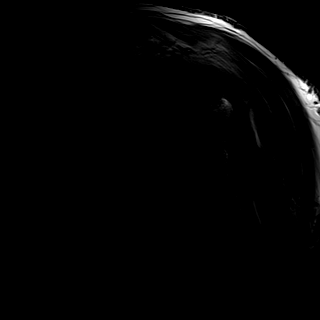
[im 10/23]
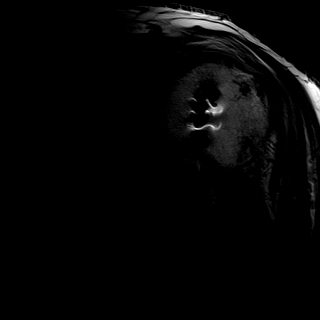
[im 13/23]
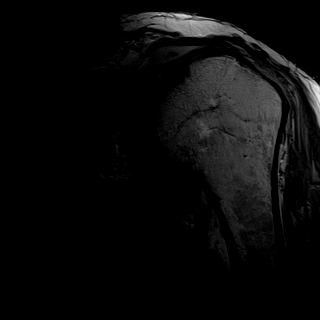
[im 16/23]
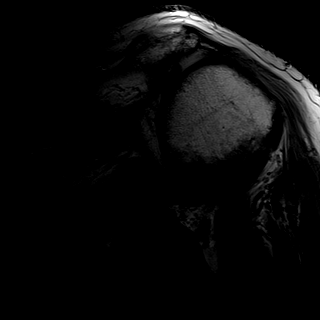
[im 19/23]
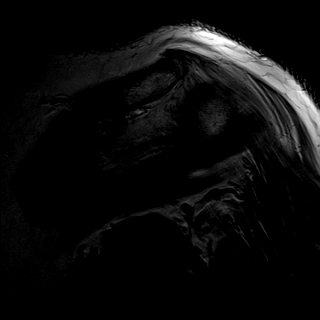
[im 23/23]
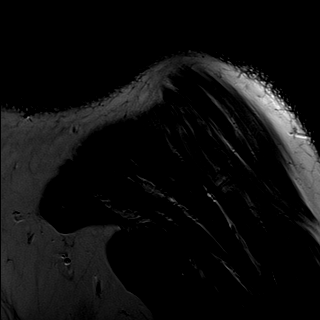

[Series 8: T2 fat-sat · axial · 4.0mm · 0.55mm/px · z∈[-59,+23]mm · 7 of 20 slices shown (3 of 3)]
[im 1/20]
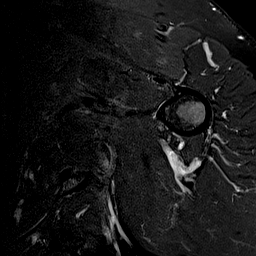
[im 4/20]
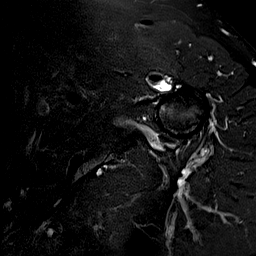
[im 7/20]
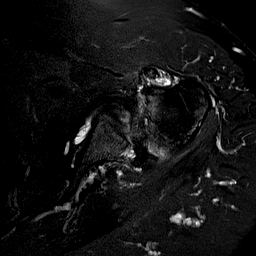
[im 10/20]
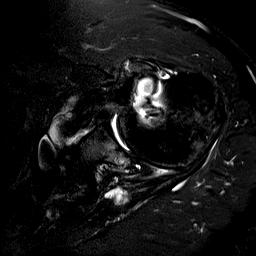
[im 13/20]
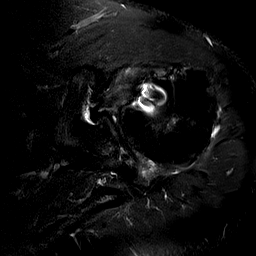
[im 16/20]
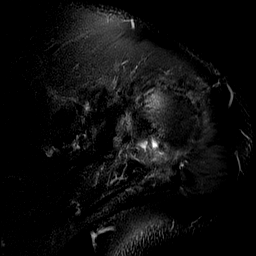
[im 20/20]
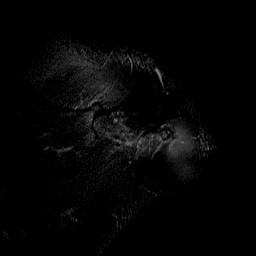

[33 of 40 positions shown; findings below may reference images not displayed]

FINDINGS: Rotator cuff: There are postsurgical changes anteriorly in the
humeral head consistent with previous rotator cuff repair. The
distal clavicle has been resected, and there has been probable
acromioplasty. The repaired rotator cuff is diffusely attenuated
with a full-thickness defect posteriorly. The infraspinatus tendon
is moderately retracted. There is severe subscapularis tendinosis
and partial tearing.

Muscles: There is prominent muscular atrophy throughout the rotator
cuff, involving the supraspinatus, infraspinatus and subscapularis
muscles. The teres minor muscle appears unremarkable.

Biceps long head: Severe tendinosis of the biceps tendon which is
medially subluxed from the superior aspect of the bicipital groove.
No frank dislocation or rupture identified.

Acromioclavicular Joint: The acromion is type 1 status post probable
acromioplasty and distal clavicle resection. No osseous encroachment
on the rotator cuff is demonstrated. There is a small amount fluid
in the subacromial -subdeltoid space, communicating with the
shoulder joint via the rotator cuff tear.

Glenohumeral Joint: Small shoulder joint effusion. There are several
ossified and cartilaginous loose bodies in the superior
subscapularis recess. There are moderately advanced glenohumeral
degenerative changes with prominent osteophytes and subchondral cyst
formation posteriorly in the glenoid. The humeral head appears
mildly subluxed posteriorly.

Labrum:  Diffuse degenerative tearing of the posterior labrum.

Bones: No acute or significant extra-articular osseous findings.

Other: No significant soft tissue findings.
IMPRESSION: 1. Postsurgical changes consistent with previous rotator cuff
repair, distal clavicle resection and probable acromioplasty.
2. Severe atrophy of the supraspinatus, infraspinatus and
subscapularis muscles. Recurrent full-thickness infraspinatus tendon
tear with tendinosis and partial tearing of the subscapularis
tendon.
3. Marked bicipital tendinosis, subluxation and partial tearing.
4. Glenohumeral degenerative changes with degenerative tearing of
the posterior labrum and several intra-articular loose bodies.

## 2017-02-22 ENCOUNTER — Telehealth: Payer: Self-pay | Admitting: Internal Medicine

## 2017-02-22 NOTE — Telephone Encounter (Signed)
Problems despite qodMiraLax alternating w/ dulcolax

## 2017-03-02 NOTE — Telephone Encounter (Signed)
I spoke to him after his wife's colonoscopy.  He has constipation - chronic and recurrent.  I suggest he ytry1 dose of iraLax daily and go up to 2 doses a day if 1 does not work.  Taking dulcolax 1-2 every 2-3 days as needed is ok also.  There are prescription medications we can try like Amitiza or Linzess if we can get coverage - if he wants to try these let me know and I will rx .

## 2017-03-02 NOTE — Telephone Encounter (Signed)
Patient notified.  He will try the BID miralax and call back if this does not help his constipation after a few weeks.

## 2017-05-16 DIAGNOSIS — G4730 Sleep apnea, unspecified: Secondary | ICD-10-CM | POA: Insufficient documentation

## 2017-05-16 DIAGNOSIS — F329 Major depressive disorder, single episode, unspecified: Secondary | ICD-10-CM | POA: Insufficient documentation

## 2017-05-16 DIAGNOSIS — R0602 Shortness of breath: Secondary | ICD-10-CM | POA: Insufficient documentation

## 2017-05-16 DIAGNOSIS — Z9109 Other allergy status, other than to drugs and biological substances: Secondary | ICD-10-CM | POA: Insufficient documentation

## 2017-05-16 DIAGNOSIS — T4145XA Adverse effect of unspecified anesthetic, initial encounter: Secondary | ICD-10-CM | POA: Insufficient documentation

## 2017-05-16 DIAGNOSIS — I10 Essential (primary) hypertension: Secondary | ICD-10-CM | POA: Insufficient documentation

## 2017-05-16 DIAGNOSIS — Z9889 Other specified postprocedural states: Secondary | ICD-10-CM

## 2017-05-16 DIAGNOSIS — M1990 Unspecified osteoarthritis, unspecified site: Secondary | ICD-10-CM | POA: Insufficient documentation

## 2017-05-16 DIAGNOSIS — F32A Depression, unspecified: Secondary | ICD-10-CM | POA: Insufficient documentation

## 2017-05-16 DIAGNOSIS — R0600 Dyspnea, unspecified: Secondary | ICD-10-CM | POA: Insufficient documentation

## 2017-05-16 DIAGNOSIS — I2510 Atherosclerotic heart disease of native coronary artery without angina pectoris: Secondary | ICD-10-CM | POA: Insufficient documentation

## 2017-05-16 DIAGNOSIS — T8859XA Other complications of anesthesia, initial encounter: Secondary | ICD-10-CM | POA: Insufficient documentation

## 2017-05-16 DIAGNOSIS — D751 Secondary polycythemia: Secondary | ICD-10-CM | POA: Insufficient documentation

## 2017-05-16 DIAGNOSIS — R112 Nausea with vomiting, unspecified: Secondary | ICD-10-CM | POA: Insufficient documentation

## 2017-05-16 DIAGNOSIS — L719 Rosacea, unspecified: Secondary | ICD-10-CM | POA: Insufficient documentation

## 2017-05-16 DIAGNOSIS — K219 Gastro-esophageal reflux disease without esophagitis: Secondary | ICD-10-CM | POA: Insufficient documentation

## 2017-05-27 ENCOUNTER — Encounter: Payer: Self-pay | Admitting: Internal Medicine

## 2017-05-27 ENCOUNTER — Ambulatory Visit: Payer: Medicare Other | Admitting: Internal Medicine

## 2017-05-27 VITALS — BP 114/78 | HR 87 | Ht 71.0 in | Wt 278.4 lb

## 2017-05-27 DIAGNOSIS — I10 Essential (primary) hypertension: Secondary | ICD-10-CM

## 2017-05-27 DIAGNOSIS — I77810 Thoracic aortic ectasia: Secondary | ICD-10-CM | POA: Diagnosis not present

## 2017-05-27 DIAGNOSIS — E782 Mixed hyperlipidemia: Secondary | ICD-10-CM

## 2017-05-27 DIAGNOSIS — I2510 Atherosclerotic heart disease of native coronary artery without angina pectoris: Secondary | ICD-10-CM

## 2017-05-27 NOTE — Patient Instructions (Signed)
Your physician recommends that you continue on your current medications as directed. Please refer to the Current Medication list given to you today. Your physician wants you to follow-up in: October, 2019 with Dr. Harrington Challenger.  You will receive a reminder letter in the mail two months in advance. If you don't receive a letter, please call our office to schedule the follow-up appointment.

## 2017-05-27 NOTE — Progress Notes (Signed)
Cardiology Office Note   Date:  05/27/2017   ID:  [name] [name], DOB 1946-02-04, MRN [id number]  PCP:  [facility], MD  Cardiologist:   [name], MD   F/U OF CAD    History of Present Illness: [name] [name] is a 71 y.o. male with a history of  Nonobstructive CAD  LVEF normal CAD GERD, HTN, HL OSA, thoracic aneurysm  Mild 4.1 mm  Seen by V [name]   CT of aorta showed asc was 3.8 cm   Small pulm nodules unchanged    About 1 1/2 mnths ago had spot in center of chest  Sore  Lumpy  Swollen  Hurt to press  Now gone  Has occasional twinges in L chest  Infrequent   Breathing oK most of time  But with walking says he  gets out of breath because of leg problems    Current Meds  Medication Sig  . aspirin EC 81 MG tablet Take 1 tablet (81 mg total) by mouth 2 (two) times daily.  . GuaiFENesin (MUCINEX PO) Take by mouth daily.  . hydrochlorothiazide (HYDRODIURIL) 25 MG tablet Take 25 mg by mouth daily.   [name] levothyroxine (SYNTHROID, LEVOTHROID) 150 MCG tablet Take 150 mcg by mouth daily before breakfast.  . losartan (COZAAR) 50 MG tablet Take 50 mg by mouth daily.  [name] omeprazole (PRILOSEC) 40 MG capsule Take 1 capsule (40 mg total) by mouth daily before breakfast. 30 minutes before  . polyethylene glycol (MIRALAX) 0.34 gm/ml SOLN Take by mouth.  . simvastatin (ZOCOR) 20 MG tablet TAKE 1 TABLET BY MOUTH  DAILY AT 6 PM.  . Trospium Chloride 60 MG CP24 Take 60 mg by mouth daily.     Allergies:   Penicillins; Codeine; and Sulfonamide derivatives   Past Medical History:  Diagnosis Date  . Arthritis   . CAD (coronary artery disease)    cath 3/10: mLAD 30%, pRCA 30%, EF 55-60% Dr. Harrington Challenger cardiologist  most recent  stress stress done ~ 2 years ago with Dr. Harrington Challenger  . Complication of anesthesia    "he usually gets an ileus after back OR"   . Depression   . Dyspnea    -PFTs compeltely normal 03/20/08 including DLc0  . Environmental allergies    Dust, Smoke  . GERD (gastroesophageal reflux  disease)   . HTN (hypertension)   . Hypothyroidism   . Low testosterone   . Observed sleep apnea    can't wear cpap  . Polycythemia   . PONV (postoperative nausea and vomiting)   . Pulmonary nodule 06/13/2012  . Rosacea   . Sleep apnea    Most recent sleep study 2010; records at [facility] office    Past Surgical History:  Procedure Laterality Date  . BACK SURGERY  2005; 06/2006; 01/2007; 04/2009  . CARDIAC CATHETERIZATION  08/2008  . CATARACT EXTRACTION, BILATERAL  2010  . CERVICAL FUSION  04/2002   ?C3-4  . COLONOSCOPY  12/2005   normal screening study.   . ESOPHAGOGASTRODUODENOSCOPY  multiple   last 07/2012 GERD esophagitis, 48 Fr dilation  . ESOPHAGOGASTRODUODENOSCOPY N/A 09/19/2015   Procedure: ESOPHAGOGASTRODUODENOSCOPY (EGD);  Surgeon: [name], MD;  Location: [facility] ENDOSCOPY;  Service: Endoscopy;  Laterality: N/A;  . KNEE ARTHROSCOPY WITH MEDIAL MENISECTOMY Right 05/14/2015   Procedure: RIGHT KNEE ARTHROSCOPY CHONDROPLASTY, PARTIAL  MEDIAL MENISECTOMY;  Surgeon: [name], MD;  Location: [place];  Service: Orthopedics;  Laterality: Right;  . LUMBAR DISC SURGERY  06/2006   L3  . MENISCUS REPAIR  08/2008   left  . POSTERIOR FUSION LUMBAR SPINE  2005; 01/2007; 04/2009   L5; L3-4; L2-3  . PROSTATE SURGERY  2000  . SAVORY DILATION N/A 09/19/2015   Procedure: SAVORY DILATION;  Surgeon: [name], MD;  Location: [facility] ENDOSCOPY;  Service: Endoscopy;  Laterality: N/A;  . SHOULDER OPEN ROTATOR CUFF REPAIR Left 1999   left  . TONSILLECTOMY  09/21/11  . TOTAL KNEE ARTHROPLASTY Left 01/2011   left  . TOTAL KNEE ARTHROPLASTY Right 08/08/2015   Procedure: RIGHT TOTAL KNEE ARTHROPLASTY;  Surgeon: [name], MD;  Location: [place];  Service: Orthopedics;  Laterality: Right;  . TRANSURETHRAL RESECTION OF PROSTATE  2006   followed by "surgery to get rid of clots"  . UVULOPALATOPHARYNGOPLASTY, TONSILLECTOMY AND SEPTOPLASTY  09/21/11   Deviated Septum      Social History:  The patient  reports that he quit smoking about 42 years ago. He has quit using smokeless tobacco. He reports that he does not drink alcohol or use drugs.   Family History:  The patient's family history includes Alzheimer's disease in his cousin and father; Bipolar disorder in his other; Breast cancer in his mother and sister; Breast cancer (age of onset: 85) in his sister; Cirrhosis in his maternal uncle; Colon polyps in his mother; Diabetes in his mother and sister; Heart Problems in his sister; Heart attack in his cousin, father, and paternal grandfather; Liver cancer in his maternal uncle; Lung cancer in his paternal uncle, paternal uncle, and paternal uncle; Other in his daughter and sister; Parkinson's disease in his other; Stroke in his paternal grandmother; Throat cancer in his maternal uncle.    ROS:  Please see the history of present illness. All other systems are reviewed and  Negative to the above problem except as noted.    PHYSICAL EXAM: VS:  BP 114/78   Pulse 87   Ht 5\' 11"  (1.803 m)   Wt 278 lb 6.4 oz (126.3 kg)   SpO2 95%   BMI 38.83 kg/m   GEN: Morbidly obese 71 yo in no acute distress  HEENT: normal  Neck: no JVD, carotid bruits, or masses Cardiac: RRR; no murmurs, rubs, or gallops,no edema  Respiratory:  clear to auscultation bilaterally, normal work of breathing GI: soft, nontender, nondistended, + BS  No hepatomegaly  MS: no deformity Moving all extremities   Skin: warm and dry, no rash Neuro:  Strength and sensation are intact Psych: euthymic mood, full affect   EKG:  EKG is not ordered   Lipid Panel No results found for: CHOL, TRIG, HDL, CHOLHDL, VLDL, LDLCALC, LDLDIRECT    Wt Readings from Last 3 Encounters:  05/27/17 278 lb 6.4 oz (126.3 kg)  07/05/16 273 lb (123.8 kg)  03/25/16 267 lb 12.8 oz (121.5 kg)      ASSESSMENT AND PLAN: 1  CAD  No symptoms of angina  Follow  He is not that active   2  Aorta  Aorta at 3.8 cm  Will  follow periodically    3  Lipids  Lipids are good  Due to have them again in Jan  Will f/u then with Dr Inda Merlin   4  HTN  BP is good      F/U n the fall      Current medicines are reviewed at length with the patient today.  The patient does not have concerns regarding medicines.  Signed, [name], MD  05/27/2017  9:55 AM    [address] [place], [place], [place]  [address] Phone: [phone]; Fax: [phone]

## 2017-08-11 ENCOUNTER — Other Ambulatory Visit: Payer: Self-pay | Admitting: Nurse Practitioner

## 2017-08-24 ENCOUNTER — Other Ambulatory Visit: Payer: Self-pay | Admitting: Family Medicine

## 2017-08-24 ENCOUNTER — Ambulatory Visit
Admission: RE | Admit: 2017-08-24 | Discharge: 2017-08-24 | Disposition: A | Discharge status: Home / Self Care | Payer: Medicare Other | Source: Ambulatory Visit | Attending: Family Medicine | Admitting: Family Medicine

## 2017-08-24 DIAGNOSIS — R0781 Pleurodynia: Secondary | ICD-10-CM

## 2017-08-29 ENCOUNTER — Telehealth: Payer: Self-pay | Admitting: *Deleted

## 2017-08-29 NOTE — Telephone Encounter (Signed)
-----   Message from Fay Records, MD sent at 08/25/2017  3:24 PM EST ----- LDL is excellent at 53   Keep on same meds  Stay active

## 2017-08-29 NOTE — Telephone Encounter (Signed)
DPR pt's wife called back and has been notified of lab results by phone with verbal understanding. Pt's wife thanked me for my call. ------

## 2017-12-09 ENCOUNTER — Ambulatory Visit (HOSPITAL_COMMUNITY)
Admission: EM | Admit: 2017-12-09 | Discharge: 2017-12-09 | Disposition: A | Discharge status: Home / Self Care | Payer: Medicare Other | Attending: Family Medicine | Admitting: Family Medicine

## 2017-12-09 ENCOUNTER — Encounter (HOSPITAL_COMMUNITY): Payer: Self-pay | Admitting: Emergency Medicine

## 2017-12-09 DIAGNOSIS — E039 Hypothyroidism, unspecified: Secondary | ICD-10-CM | POA: Diagnosis not present

## 2017-12-09 DIAGNOSIS — K219 Gastro-esophageal reflux disease without esophagitis: Secondary | ICD-10-CM | POA: Insufficient documentation

## 2017-12-09 DIAGNOSIS — G4733 Obstructive sleep apnea (adult) (pediatric): Secondary | ICD-10-CM | POA: Insufficient documentation

## 2017-12-09 DIAGNOSIS — Z885 Allergy status to narcotic agent status: Secondary | ICD-10-CM | POA: Diagnosis not present

## 2017-12-09 DIAGNOSIS — Z79899 Other long term (current) drug therapy: Secondary | ICD-10-CM | POA: Diagnosis not present

## 2017-12-09 DIAGNOSIS — Z880 Allergy status to penicillin: Secondary | ICD-10-CM | POA: Diagnosis not present

## 2017-12-09 DIAGNOSIS — Z808 Family history of malignant neoplasm of other organs or systems: Secondary | ICD-10-CM | POA: Insufficient documentation

## 2017-12-09 DIAGNOSIS — Z8371 Family history of colonic polyps: Secondary | ICD-10-CM | POA: Insufficient documentation

## 2017-12-09 DIAGNOSIS — N3091 Cystitis, unspecified with hematuria: Secondary | ICD-10-CM | POA: Diagnosis not present

## 2017-12-09 DIAGNOSIS — Z7982 Long term (current) use of aspirin: Secondary | ICD-10-CM | POA: Insufficient documentation

## 2017-12-09 DIAGNOSIS — Z87891 Personal history of nicotine dependence: Secondary | ICD-10-CM | POA: Diagnosis not present

## 2017-12-09 DIAGNOSIS — Z882 Allergy status to sulfonamides status: Secondary | ICD-10-CM | POA: Insufficient documentation

## 2017-12-09 DIAGNOSIS — F329 Major depressive disorder, single episode, unspecified: Secondary | ICD-10-CM | POA: Diagnosis not present

## 2017-12-09 DIAGNOSIS — I2510 Atherosclerotic heart disease of native coronary artery without angina pectoris: Secondary | ICD-10-CM | POA: Insufficient documentation

## 2017-12-09 DIAGNOSIS — Z96653 Presence of artificial knee joint, bilateral: Secondary | ICD-10-CM | POA: Diagnosis not present

## 2017-12-09 DIAGNOSIS — R319 Hematuria, unspecified: Secondary | ICD-10-CM | POA: Diagnosis present

## 2017-12-09 DIAGNOSIS — I10 Essential (primary) hypertension: Secondary | ICD-10-CM | POA: Diagnosis not present

## 2017-12-09 DIAGNOSIS — Z79890 Hormone replacement therapy (postmenopausal): Secondary | ICD-10-CM | POA: Diagnosis not present

## 2017-12-09 DIAGNOSIS — E782 Mixed hyperlipidemia: Secondary | ICD-10-CM | POA: Diagnosis not present

## 2017-12-09 LAB — URINALYSIS, MICROSCOPIC (REFLEX): Squamous Epithelial / LPF: NONE SEEN (ref 0–5)

## 2017-12-09 LAB — POCT URINALYSIS DIP (DEVICE)
Glucose, UA: 100 mg/dL — AB
KETONES UR: 15 mg/dL — AB
Nitrite: NEGATIVE
PH: 6 (ref 5.0–8.0)
Specific Gravity, Urine: 1.01 (ref 1.005–1.030)
UROBILINOGEN UA: 4 mg/dL — AB (ref 0.0–1.0)

## 2017-12-09 LAB — URINALYSIS, ROUTINE W REFLEX MICROSCOPIC

## 2017-12-09 MED ORDER — NITROFURANTOIN MONOHYD MACRO 100 MG PO CAPS: 100.0000 mg | ORAL_CAPSULE | Freq: Two times a day (BID) | ORAL | 0 refills | Status: DC

## 2017-12-09 NOTE — Discharge Instructions (Addendum)
Push fluids Take the antibiotic as instructed You should see improvement in a day or two Take the full 10 days Follow up in 2 weeks for a repeat urine test with your doctor  IF you get worse, have trouble with bladder emptying or abdominal pain, return here or to the ER

## 2017-12-09 NOTE — ED Triage Notes (Signed)
Pt sts blood in urine and pain with urination starting today

## 2017-12-09 NOTE — ED Provider Notes (Signed)
[place]    CSN: [id number] Arrival date & time: 12/09/17  1449     History   Chief Complaint Chief Complaint  Patient presents with  . Hematuria    HPI [name] [name] is a 72 y.o. male.   HPI  Very pleasant 72 year old gentleman who noticed hematuria this morning.  At first it was just a "tinge" of pink in the urine.  As the day progressed he had gross red urine with some small blood clots.  He has a mild amount of pressure in his lower abdomen.  No dysuria.  Mild frequency.  No flank pain.  No fever chills.  No nausea vomiting. No history of kidney or bladder stones. He states he has had 3 prostate surgeries.  All for prostate enlargement.  No prostate cancer. No history of cancers.  Quit smoking 43 years ago. Appetite is been good.  No weight loss. History of cardiovascular disease.  Compliant with medications.  He is on baby aspirin. History of orthopedic problems, knee problems, back problems..  Limited mobility.  Past Medical History:  Diagnosis Date  . Arthritis   . CAD (coronary artery disease)    cath 3/10: mLAD 30%, pRCA 30%, EF 55-60% Dr. Harrington Challenger cardiologist  most recent  stress stress done ~ 2 years ago with Dr. Harrington Challenger  . Complication of anesthesia    "he usually gets an ileus after back OR"   . Depression   . Dyspnea    -PFTs compeltely normal 03/20/08 including DLc0  . Environmental allergies    Dust, Smoke  . GERD (gastroesophageal reflux disease)   . HTN (hypertension)   . Hypothyroidism   . Low testosterone   . Observed sleep apnea    can't wear cpap  . Polycythemia   . PONV (postoperative nausea and vomiting)   . Pulmonary nodule 06/13/2012  . Rosacea   . Sleep apnea    Most recent sleep study 2010; records at [facility]    Patient Active Problem List   Diagnosis Date Noted  . Sleep apnea   . Rosacea   . PONV (postoperative nausea and vomiting)   . Polycythemia   . Observed sleep apnea   . HTN (hypertension)   . GERD  (gastroesophageal reflux disease)   . Environmental allergies   . Dyspnea   . Depression   . Complication of anesthesia   . CAD (coronary artery disease)   . Arthritis   . Genetic testing 03/07/2016  . Atypical chest pain 09/18/2015  . Erosive esophagitis (history of with prior dilatation 2014) 09/18/2015  . OSA (obstructive sleep apnea) 09/18/2015  . Positive D dimer 09/18/2015  . Mild concentric left ventricular hypertrophy (LVH) 09/18/2015  . 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[place]) 09/18/2015  . Hypothyroidism 09/18/2015  . Constipation   . Dyslipidemia 08/18/2015  . Constipation, slow transit 08/18/2015  . Status post right knee replacement 08/18/2015  . Primary localized osteoarthritis of right knee 08/08/2015  . Abnormality of gait 03/14/2013  . Pulmonary nodule 06/13/2012  . Pre-operative cardiovascular examination 01/27/2011  . Mixed hyperlipidemia 06/18/2009  . CAD, NATIVE VESSEL 06/18/2009  . WEIGHT GAIN, ABNORMAL 03/20/2008  . DYSPNEA 03/20/2008  . Essential hypertension 07/14/2007  . G E R D 07/14/2007    Past Surgical History:  Procedure Laterality Date  . BACK SURGERY  2005; 06/2006; 01/2007; 04/2009  . CARDIAC CATHETERIZATION  08/2008  . CATARACT EXTRACTION, BILATERAL  2010  . CERVICAL FUSION  04/2002   ?C3-4  .  COLONOSCOPY  12/2005   normal screening study.   . ESOPHAGOGASTRODUODENOSCOPY  multiple   last 07/2012 GERD esophagitis, 48 Fr dilation  . ESOPHAGOGASTRODUODENOSCOPY N/A 09/19/2015   Procedure: ESOPHAGOGASTRODUODENOSCOPY (EGD);  Surgeon: [name], MD;  [facility] END[facility];  Service: Endoscopy;  Laterality: N/A;  . KNEE ARTHROSCOPY WITH MEDIAL MENISECTOMY Right 05/14/2015   Procedure: RIGHT KNEE ARTHROSCOPY CHONDROPLASTY, PARTIAL  MEDIAL MENISECTOMY;  Surgeon: [name], MD;  Location: [place];  Service: Orthopedics;  Laterality: Right;  . LUMBAR DISC SURGERY  06/2006   L3  . MENISCUS REPAIR  08/2008   left  . POSTERIOR  FUSION LUMBAR SPINE  2005; 01/2007; 04/2009   L5; L3-4; L2-3  . PROSTATE SURGERY  2000  . SAVORY DILATION N/A 09/19/2015   Procedure: SAVORY DILATION;  Surgeon: [name], MD;  Location: [facility] ENDOSCOPY;  Service: Endoscopy;  Laterality: N/A;  . SHOULDER OPEN ROTATOR CUFF REPAIR Left 1999   left  . TONSILLECTOMY  09/21/11  . TOTAL KNEE ARTHROPLASTY Left 01/2011   left  . TOTAL KNEE ARTHROPLASTY Right 08/08/2015   Procedure: RIGHT TOTAL KNEE ARTHROPLASTY;  Surgeon: [name], MD;  Location: [place];  Service: Orthopedics;  Laterality: Right;  . TRANSURETHRAL RESECTION OF PROSTATE  2006   followed by "surgery to get rid of clots"  . UVULOPALATOPHARYNGOPLASTY, TONSILLECTOMY AND SEPTOPLASTY  09/21/11   Deviated Septum       Home Medications    Prior to Admission medications   Medication Sig Start Date End Date Taking? Authorizing Provider  aspirin EC 81 MG tablet Take 1 tablet (81 mg total) by mouth 2 (two) times daily. 10/03/15   Thurnell Lose, MD  GuaiFENesin (MUCINEX PO) Take by mouth daily.    [provider]  hydrochlorothiazide (HYDRODIURIL) 25 MG tablet Take 25 mg by mouth daily.     [provider]  levothyroxine (SYNTHROID, LEVOTHROID) 150 MCG tablet Take 150 mcg by mouth daily before breakfast.    [provider]  losartan (COZAAR) 50 MG tablet Take 50 mg by mouth daily.    [provider]  nitrofurantoin, macrocrystal-monohydrate, (MACROBID) 100 MG capsule Take 1 capsule (100 mg total) by mouth 2 (two) times daily. 12/09/17   Raylene Everts, MD  omeprazole (PRILOSEC) 40 MG capsule Take 1 capsule (40 mg total) by mouth daily before breakfast. 30 minutes before 09/19/15   Thurnell Lose, MD  polyethylene glycol (MIRALAX) 0.34 gm/ml SOLN Take by mouth.    [provider]  simvastatin (ZOCOR) 20 MG tablet TAKE 1 TABLET BY MOUTH  DAILY AT 6 PM. 08/12/17   Burtis Junes, NP  Trospium Chloride 60 MG CP24 Take 60 mg by mouth  daily.    [provider]    Family History Family History  Problem Relation Age of Onset  . Breast cancer Mother        dx. mid-70s  . Colon polyps Mother        "3+ colon polyps per yr"  . Diabetes Mother   . Breast cancer Sister 66  . Other Sister        + MUTYH mutation  . Diabetes Sister        borderline  . Heart Problems Sister   . Breast cancer Sister        dx. 19s  . Heart attack Father   . Alzheimer's disease Father   . Lung cancer Paternal Uncle  x4  . Throat cancer Maternal Uncle        mother's maternal half-brother; dx 49s; +EtOH  . Liver cancer Maternal Uncle        mother's maternal half-brother; either liver cancer or cirrhosis  . Cirrhosis Maternal Uncle        +EtOH  . Stroke Paternal Grandmother   . Heart attack Paternal Grandfather   . Other Daughter        negative genetic testing in 2017  . Parkinson's disease Other   . Bipolar disorder Other   . Lung cancer Paternal Uncle        (x3)  . Lung cancer Paternal Uncle   . Alzheimer's disease Cousin        paternal 1st cousin, d. 10  . Heart attack Cousin   . Anesthesia problems Neg Hx     Social History Social History   Tobacco Use  . Smoking status: Former Smoker    Last attempt to quit: 07/17/1974    Years since quitting: 43.4  . Smokeless tobacco: Former Network engineer Use Topics  . Alcohol use: No  . Drug use: No     Allergies   Penicillins; Codeine; and Sulfonamide derivatives   Review of Systems Review of Systems  Constitutional: Negative for chills and fever.  HENT: Negative for ear pain and sore throat.   Eyes: Negative for pain and visual disturbance.  Respiratory: Negative for cough and shortness of breath.   Cardiovascular: Negative for chest pain and palpitations.  Gastrointestinal: Positive for abdominal distention. Negative for abdominal pain, diarrhea, nausea and vomiting.       Mild suprapubic pressure  Genitourinary: Positive for frequency and  hematuria. Negative for dysuria and flank pain.  Musculoskeletal: Positive for arthralgias, back pain and gait problem.  Skin: Negative for color change and rash.  Neurological: Negative for seizures and syncope.  All other systems reviewed and are negative.    Physical Exam Triage Vital Signs ED Triage Vitals [12/09/17 1515]  Enc Vitals Group     BP 120/76     Pulse Rate 98     Resp 18     Temp 98.9 F (37.2 C)     Temp Source Oral     SpO2 95 %     Weight      Height      Head Circumference      Peak Flow      Pain Score      Pain Loc      Pain Edu?      Excl. in [place]?    No data found.  Updated Vital Signs BP 120/76 (BP Location: Left Arm)   Pulse 98   Temp 98.9 F (37.2 C) (Oral)   Resp 18   SpO2 95%   Visual Acuity Right Eye Distance:   Left Eye Distance:   Bilateral Distance:    Right Eye Near:   Left Eye Near:    Bilateral Near:     Physical Exam  Constitutional: He appears well-developed and well-nourished. No distress.  HENT:  Head: Normocephalic and atraumatic.  Mouth/Throat: Oropharynx is clear and moist.  Eyes: Pupils are equal, round, and reactive to light. Conjunctivae are normal.  Neck: Normal range of motion.  Cardiovascular: Normal rate, regular rhythm and normal heart sounds.  Pulmonary/Chest: Effort normal and breath sounds normal. No respiratory distress.  Abdominal: Soft. He exhibits distension. There is no tenderness.  Protuberant obese abdomen.  Nontender No CVAT  Musculoskeletal: Normal range of motion. He exhibits no edema.  Neurological: He is alert.  Skin: Skin is warm and dry.     UC Treatments / Results  Labs (all labs ordered are listed, but only abnormal results are displayed) Labs Reviewed  POCT URINALYSIS DIP (DEVICE) - Abnormal; Notable for the following components:      Result Value   Glucose, UA 100 (*)    Bilirubin Urine LARGE (*)    Ketones, ur 15 (*)    Hgb urine dipstick LARGE (*)    Protein, ur >=300 (*)     Urobilinogen, UA 4.0 (*)    Leukocytes, UA LARGE (*)    All other components within normal limits  URINE CULTURE  URINALYSIS, ROUTINE W REFLEX MICROSCOPIC    EKG None  Radiology No results found.  Procedures Procedures (including critical care time)  Medications Ordered in UC Medications - No data to display  Initial Impression / Assessment and Plan / UC Course  I have reviewed the triage vital signs and the nursing notes.  Pertinent labs & imaging results that were available during my care of the patient were reviewed by me and considered in my medical decision making (see chart for details).     Discussed the differential diagnosis of hematuria.  There can be stones anywhere in the urinary tract.  Infection anywhere in the urinary tract, kidney bladder or prostate.  Less likely would be a malignancy.  He does have leukocytes, malodorous urine.  I think this is likely an infectious process.  We will treat him with antibiotics but it was emphasized to him the importance of following up to make sure the hematuria clears with treatment. Final Clinical Impressions(s) / UC Diagnoses   Final diagnoses:  Hemorrhagic cystitis     Discharge Instructions     Push fluids Take the antibiotic as instructed You should see improvement in a day or two Take the full 10 days Follow up in 2 weeks for a repeat urine test with your doctor  IF you get worse, have trouble with bladder emptying or abdominal pain, return here or to the ER   ED Prescriptions    Medication Sig Dispense Auth. Provider   nitrofurantoin, macrocrystal-monohydrate, (MACROBID) 100 MG capsule Take 1 capsule (100 mg total) by mouth 2 (two) times daily. 20 capsule Raylene Everts, MD     Controlled Substance Prescriptions [place] Controlled Substance Registry consulted? Not Applicable   Raylene Everts, MD 12/09/17 [phone]

## 2017-12-10 LAB — URINE CULTURE

## 2017-12-27 ENCOUNTER — Other Ambulatory Visit: Payer: Self-pay

## 2017-12-27 ENCOUNTER — Encounter (HOSPITAL_COMMUNITY): Payer: Self-pay | Admitting: Emergency Medicine

## 2017-12-27 ENCOUNTER — Ambulatory Visit (HOSPITAL_COMMUNITY)
Admission: EM | Admit: 2017-12-27 | Discharge: 2017-12-27 | Disposition: A | Discharge status: Home / Self Care | Payer: Medicare Other | Attending: Family Medicine | Admitting: Family Medicine

## 2017-12-27 DIAGNOSIS — R300 Dysuria: Secondary | ICD-10-CM

## 2017-12-27 DIAGNOSIS — Z818 Family history of other mental and behavioral disorders: Secondary | ICD-10-CM | POA: Insufficient documentation

## 2017-12-27 DIAGNOSIS — F329 Major depressive disorder, single episode, unspecified: Secondary | ICD-10-CM | POA: Diagnosis not present

## 2017-12-27 DIAGNOSIS — I2510 Atherosclerotic heart disease of native coronary artery without angina pectoris: Secondary | ICD-10-CM | POA: Insufficient documentation

## 2017-12-27 DIAGNOSIS — L719 Rosacea, unspecified: Secondary | ICD-10-CM | POA: Diagnosis not present

## 2017-12-27 DIAGNOSIS — Z79899 Other long term (current) drug therapy: Secondary | ICD-10-CM | POA: Insufficient documentation

## 2017-12-27 DIAGNOSIS — K219 Gastro-esophageal reflux disease without esophagitis: Secondary | ICD-10-CM | POA: Insufficient documentation

## 2017-12-27 DIAGNOSIS — Z820 Family history of epilepsy and other diseases of the nervous system: Secondary | ICD-10-CM | POA: Insufficient documentation

## 2017-12-27 DIAGNOSIS — M4326 Fusion of spine, lumbar region: Secondary | ICD-10-CM | POA: Diagnosis not present

## 2017-12-27 DIAGNOSIS — Z803 Family history of malignant neoplasm of breast: Secondary | ICD-10-CM | POA: Insufficient documentation

## 2017-12-27 DIAGNOSIS — I10 Essential (primary) hypertension: Secondary | ICD-10-CM | POA: Diagnosis not present

## 2017-12-27 DIAGNOSIS — Z880 Allergy status to penicillin: Secondary | ICD-10-CM | POA: Insufficient documentation

## 2017-12-27 DIAGNOSIS — R350 Frequency of micturition: Secondary | ICD-10-CM | POA: Diagnosis not present

## 2017-12-27 DIAGNOSIS — Z9842 Cataract extraction status, left eye: Secondary | ICD-10-CM | POA: Insufficient documentation

## 2017-12-27 DIAGNOSIS — E782 Mixed hyperlipidemia: Secondary | ICD-10-CM | POA: Diagnosis not present

## 2017-12-27 DIAGNOSIS — Z885 Allergy status to narcotic agent status: Secondary | ICD-10-CM | POA: Insufficient documentation

## 2017-12-27 DIAGNOSIS — G4733 Obstructive sleep apnea (adult) (pediatric): Secondary | ICD-10-CM | POA: Insufficient documentation

## 2017-12-27 DIAGNOSIS — Z9889 Other specified postprocedural states: Secondary | ICD-10-CM | POA: Diagnosis not present

## 2017-12-27 DIAGNOSIS — Z800 Family history of malignant neoplasm of digestive organs: Secondary | ICD-10-CM | POA: Insufficient documentation

## 2017-12-27 DIAGNOSIS — Z7982 Long term (current) use of aspirin: Secondary | ICD-10-CM | POA: Diagnosis not present

## 2017-12-27 DIAGNOSIS — Z823 Family history of stroke: Secondary | ICD-10-CM | POA: Insufficient documentation

## 2017-12-27 DIAGNOSIS — Z833 Family history of diabetes mellitus: Secondary | ICD-10-CM | POA: Insufficient documentation

## 2017-12-27 DIAGNOSIS — Z96651 Presence of right artificial knee joint: Secondary | ICD-10-CM | POA: Insufficient documentation

## 2017-12-27 DIAGNOSIS — N390 Urinary tract infection, site not specified: Secondary | ICD-10-CM | POA: Diagnosis not present

## 2017-12-27 DIAGNOSIS — Z96653 Presence of artificial knee joint, bilateral: Secondary | ICD-10-CM | POA: Insufficient documentation

## 2017-12-27 DIAGNOSIS — E039 Hypothyroidism, unspecified: Secondary | ICD-10-CM | POA: Diagnosis not present

## 2017-12-27 DIAGNOSIS — Z87891 Personal history of nicotine dependence: Secondary | ICD-10-CM | POA: Insufficient documentation

## 2017-12-27 DIAGNOSIS — Z882 Allergy status to sulfonamides status: Secondary | ICD-10-CM | POA: Insufficient documentation

## 2017-12-27 DIAGNOSIS — Z8249 Family history of ischemic heart disease and other diseases of the circulatory system: Secondary | ICD-10-CM | POA: Insufficient documentation

## 2017-12-27 DIAGNOSIS — Z9841 Cataract extraction status, right eye: Secondary | ICD-10-CM | POA: Insufficient documentation

## 2017-12-27 DIAGNOSIS — Z8371 Family history of colonic polyps: Secondary | ICD-10-CM | POA: Diagnosis not present

## 2017-12-27 DIAGNOSIS — M1990 Unspecified osteoarthritis, unspecified site: Secondary | ICD-10-CM | POA: Diagnosis not present

## 2017-12-27 LAB — POCT URINALYSIS DIP (DEVICE)
Bilirubin Urine: NEGATIVE
GLUCOSE, UA: NEGATIVE mg/dL
KETONES UR: NEGATIVE mg/dL
NITRITE: NEGATIVE
Protein, ur: NEGATIVE mg/dL
Specific Gravity, Urine: 1.02 (ref 1.005–1.030)
Urobilinogen, UA: 0.2 mg/dL (ref 0.0–1.0)
pH: 7 (ref 5.0–8.0)

## 2017-12-27 MED ORDER — CIPROFLOXACIN HCL 500 MG PO TABS: 500.0000 mg | ORAL_TABLET | Freq: Two times a day (BID) | ORAL | 0 refills | Status: DC

## 2017-12-27 NOTE — ED Triage Notes (Signed)
The patient presented to the [facility] with a complaint of dysuria and cloudy urine x 2 weeks.

## 2017-12-27 NOTE — ED Notes (Signed)
Dirty and clean urine collected. 

## 2017-12-27 NOTE — ED Provider Notes (Signed)
[name] [name]    CSN: [id number] Arrival date & time: 12/27/17  1123     History   Chief Complaint Chief Complaint  Patient presents with  . Dysuria    HPI [name] [name] is a 72 y.o. male.   Patient complains of discomfort with urination as well as frequency.  He tells me he gets up every hour during the night to urinate.  He was seen here about 2 weeks ago and treated with Macrobid.  Urine culture at that time showed multiple species.  He believes the blood in the urine improved but not dysuria or frequency.  HPI  Past Medical History:  Diagnosis Date  . Arthritis   . CAD (coronary artery disease)    cath 3/10: mLAD 30%, pRCA 30%, EF 55-60% Dr. Harrington Challenger cardiologist  most recent  stress stress done ~ 2 years ago with Dr. Harrington Challenger  . Complication of anesthesia    "he usually gets an ileus after back OR"   . Depression   . Dyspnea    -PFTs compeltely normal 03/20/08 including DLc0  . Environmental allergies    Dust, Smoke  . GERD (gastroesophageal reflux disease)   . HTN (hypertension)   . Hypothyroidism   . Low testosterone   . Observed sleep apnea    can't wear cpap  . Polycythemia   . PONV (postoperative nausea and vomiting)   . Pulmonary nodule 06/13/2012  . Rosacea   . Sleep apnea    Most recent sleep study 2010; records at [facility]    Patient Active Problem List   Diagnosis Date Noted  . Sleep apnea   . Rosacea   . PONV (postoperative nausea and vomiting)   . Polycythemia   . Observed sleep apnea   . HTN (hypertension)   . GERD (gastroesophageal reflux disease)   . Environmental allergies   . Dyspnea   . Depression   . Complication of anesthesia   . CAD (coronary artery disease)   . Arthritis   . Genetic testing 03/07/2016  . Atypical chest pain 09/18/2015  . Erosive esophagitis (history of with prior dilatation 2014) 09/18/2015  . OSA (obstructive sleep apnea) 09/18/2015  . Positive D dimer 09/18/2015  . Mild concentric left  ventricular hypertrophy (LVH) 09/18/2015  . 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[place]) 09/18/2015  . Hypothyroidism 09/18/2015  . Constipation   . Dyslipidemia 08/18/2015  . Constipation, slow transit 08/18/2015  . Status post right knee replacement 08/18/2015  . Primary localized osteoarthritis of right knee 08/08/2015  . Abnormality of gait 03/14/2013  . Pulmonary nodule 06/13/2012  . Pre-operative cardiovascular examination 01/27/2011  . Mixed hyperlipidemia 06/18/2009  . CAD, NATIVE VESSEL 06/18/2009  . WEIGHT GAIN, ABNORMAL 03/20/2008  . DYSPNEA 03/20/2008  . Essential hypertension 07/14/2007  . G E R D 07/14/2007    Past Surgical History:  Procedure Laterality Date  . BACK SURGERY  2005; 06/2006; 01/2007; 04/2009  . CARDIAC CATHETERIZATION  08/2008  . CATARACT EXTRACTION, BILATERAL  2010  . CERVICAL FUSION  04/2002   ?C3-4  . COLONOSCOPY  12/2005   normal screening study.   . ESOPHAGOGASTRODUODENOSCOPY  multiple   last 07/2012 GERD esophagitis, 48 Fr dilation  . ESOPHAGOGASTRODUODENOSCOPY N/A 09/19/2015   Procedure: ESOPHAGOGASTRODUODENOSCOPY (EGD);  Surgeon: [name], MD;  Location: [facility] ENDOSCOPY;  Service: Endoscopy;  Laterality: N/A;  . KNEE ARTHROSCOPY WITH MEDIAL MENISECTOMY Right 05/14/2015   Procedure: RIGHT KNEE ARTHROSCOPY CHONDROPLASTY, PARTIAL  MEDIAL MENISECTOMY;  Surgeon: Quillian Quince  French Ana, MD;  Location: [place];  Service: Orthopedics;  Laterality: Right;  . LUMBAR DISC SURGERY  06/2006   L3  . MENISCUS REPAIR  08/2008   left  . POSTERIOR FUSION LUMBAR SPINE  2005; 01/2007; 04/2009   L5; L3-4; L2-3  . PROSTATE SURGERY  2000  . SAVORY DILATION N/A 09/19/2015   Procedure: SAVORY DILATION;  Surgeon: [name], MD;  Location: [facility] ENDOSCOPY;  Service: Endoscopy;  Laterality: N/A;  . SHOULDER OPEN ROTATOR CUFF REPAIR Left 1999   left  . TONSILLECTOMY  09/21/11  . TOTAL KNEE ARTHROPLASTY Left 01/2011   left  . TOTAL KNEE ARTHROPLASTY Right 08/08/2015     Procedure: RIGHT TOTAL KNEE ARTHROPLASTY;  Surgeon: [name], MD;  Location: [place];  Service: Orthopedics;  Laterality: Right;  . TRANSURETHRAL RESECTION OF PROSTATE  2006   followed by "surgery to get rid of clots"  . UVULOPALATOPHARYNGOPLASTY, TONSILLECTOMY AND SEPTOPLASTY  09/21/11   Deviated Septum       Home Medications    Prior to Admission medications   Medication Sig Start Date End Date Taking? Authorizing Provider  omeprazole (PRILOSEC) 40 MG capsule Take 1 capsule (40 mg total) by mouth daily before breakfast. 30 minutes before 09/19/15  Yes Thurnell Lose, MD  aspirin EC 81 MG tablet Take 1 tablet (81 mg total) by mouth 2 (two) times daily. 10/03/15   Thurnell Lose, MD  ciprofloxacin (CIPRO) 500 MG tablet Take 1 tablet (500 mg total) by mouth every 12 (twelve) hours. 12/27/17   Wardell Honour, MD  GuaiFENesin (MUCINEX PO) Take by mouth daily.    [provider]  hydrochlorothiazide (HYDRODIURIL) 25 MG tablet Take 25 mg by mouth daily.     [provider]  levothyroxine (SYNTHROID, LEVOTHROID) 150 MCG tablet Take 150 mcg by mouth daily before breakfast.    [provider]  losartan (COZAAR) 50 MG tablet Take 50 mg by mouth daily.    [provider]  nitrofurantoin, macrocrystal-monohydrate, (MACROBID) 100 MG capsule Take 1 capsule (100 mg total) by mouth 2 (two) times daily. 12/09/17   Raylene Everts, MD  polyethylene glycol (MIRALAX) 0.34 gm/ml SOLN Take by mouth.    [provider]  simvastatin (ZOCOR) 20 MG tablet TAKE 1 TABLET BY MOUTH  DAILY AT 6 PM. 08/12/17   Burtis Junes, NP  Trospium Chloride 60 MG CP24 Take 60 mg by mouth daily.    [provider]    Family History Family History  Problem Relation Age of Onset  . Breast cancer Mother        dx. mid-70s  . Colon polyps Mother        "54+ colon polyps per yr"  . Diabetes Mother   . Breast cancer Sister 17  . Other Sister        + MUTYH  mutation  . Diabetes Sister        borderline  . Heart Problems Sister   . Breast cancer Sister        dx. 10s  . Heart attack Father   . Alzheimer's disease Father   . Lung cancer Paternal Uncle        x4  . Throat cancer Maternal Uncle        mother's maternal half-brother; dx 33s; +EtOH  . Liver cancer Maternal Uncle        mother's maternal half-brother; either liver cancer or cirrhosis  . Cirrhosis Maternal Uncle        +  EtOH  . Stroke Paternal Grandmother   . Heart attack Paternal Grandfather   . Other Daughter        negative genetic testing in 2017  . Parkinson's disease Other   . Bipolar disorder Other   . Lung cancer Paternal Uncle        (x3)  . Lung cancer Paternal Uncle   . Alzheimer's disease Cousin        paternal 1st cousin, d. 45  . Heart attack Cousin   . Anesthesia problems Neg Hx     Social History Social History   Tobacco Use  . Smoking status: Former Smoker    Last attempt to quit: 07/17/1974    Years since quitting: 43.4  . Smokeless tobacco: Former Network engineer Use Topics  . Alcohol use: No  . Drug use: No     Allergies   Penicillins; Codeine; and Sulfonamide derivatives   Review of Systems Review of Systems  Constitutional: Negative.   HENT: Negative.   Respiratory: Negative.   Cardiovascular: Negative.   Genitourinary: Positive for dysuria and frequency.     Physical Exam Triage Vital Signs ED Triage Vitals  Enc Vitals Group     BP 12/27/17 1141 116/65     Pulse Rate 12/27/17 1141 96     Resp 12/27/17 1141 20     Temp 12/27/17 1141 98.5 F (36.9 C)     Temp Source 12/27/17 1141 Oral     SpO2 12/27/17 1141 98 %     Weight --      Height --      Head Circumference --      Peak Flow --      Pain Score 12/27/17 1140 0     Pain Loc --      Pain Edu? --      Excl. in [place]? --    No data found.  Updated Vital Signs BP 116/65 (BP Location: Left Arm)   Pulse 96   Temp 98.5 F (36.9 C) (Oral)   Resp 20   SpO2 98%     Visual Acuity Right Eye Distance:   Left Eye Distance:   Bilateral Distance:    Right Eye Near:   Left Eye Near:    Bilateral Near:     Physical Exam  Constitutional: He appears well-developed and well-nourished.  Genitourinary:  Genitourinary Comments: No CVA tenderness or suprapubic pain. Urinalysis still shows moderate leukocytes and culture will be attempted again.     UC Treatments / Results  Labs (all labs ordered are listed, but only abnormal results are displayed) Labs Reviewed  POCT URINALYSIS DIP (DEVICE) - Abnormal; Notable for the following components:      Result Value   Hgb urine dipstick SMALL (*)    Leukocytes, UA MODERATE (*)    All other components within normal limits  URINE CULTURE    EKG None  Radiology No results found.  Procedures Procedures (including critical care time)  Medications Ordered in UC Medications - No data to display  Initial Impression / Assessment and Plan / UC Course  I have reviewed the triage vital signs and the nursing notes.  Pertinent labs & imaging results that were available during my care of the patient were reviewed by me and considered in my medical decision making (see chart for details).     Urinary frequency and dysuria.  Repeat urine culture and begin Cipro pending results of culture Final Clinical Impressions(s) / UC Diagnoses  Final diagnoses:  Lower urinary tract infectious disease   Discharge Instructions   None    ED Prescriptions    Medication Sig Dispense Auth. Provider   ciprofloxacin (CIPRO) 500 MG tablet Take 1 tablet (500 mg total) by mouth every 12 (twelve) hours. 10 tablet Wardell Honour, MD     Controlled Substance Prescriptions [place] Controlled Substance Registry consulted? No   Wardell Honour, MD 12/27/17 1220

## 2017-12-29 LAB — URINE CULTURE

## 2017-12-30 ENCOUNTER — Telehealth (HOSPITAL_COMMUNITY): Payer: Self-pay

## 2017-12-30 NOTE — Telephone Encounter (Signed)
Urine culture positive for proteus mirabilis this was treated with cipro at urgent care visit. Patient called and made aware.

## 2018-02-21 ENCOUNTER — Other Ambulatory Visit: Payer: Self-pay | Admitting: Orthopedic Surgery

## 2018-02-21 DIAGNOSIS — M542 Cervicalgia: Secondary | ICD-10-CM

## 2018-02-21 DIAGNOSIS — M5412 Radiculopathy, cervical region: Secondary | ICD-10-CM

## 2018-02-22 ENCOUNTER — Ambulatory Visit
Admission: RE | Admit: 2018-02-22 | Discharge: 2018-02-22 | Disposition: A | Discharge status: Home / Self Care | Payer: Medicare Other | Source: Ambulatory Visit | Attending: Orthopedic Surgery | Admitting: Orthopedic Surgery

## 2018-02-22 DIAGNOSIS — M542 Cervicalgia: Secondary | ICD-10-CM

## 2018-02-22 DIAGNOSIS — M5412 Radiculopathy, cervical region: Secondary | ICD-10-CM

## 2018-02-24 ENCOUNTER — Other Ambulatory Visit: Payer: Medicare Other

## 2018-04-18 ENCOUNTER — Other Ambulatory Visit: Payer: Self-pay | Admitting: Neurological Surgery

## 2018-04-18 DIAGNOSIS — M4712 Other spondylosis with myelopathy, cervical region: Secondary | ICD-10-CM

## 2018-04-18 DIAGNOSIS — M4714 Other spondylosis with myelopathy, thoracic region: Secondary | ICD-10-CM

## 2018-05-02 ENCOUNTER — Ambulatory Visit
Admission: RE | Admit: 2018-05-02 | Discharge: 2018-05-02 | Disposition: A | Discharge status: Home / Self Care | Payer: Medicare Other | Source: Ambulatory Visit | Attending: Neurological Surgery | Admitting: Neurological Surgery

## 2018-05-02 DIAGNOSIS — M4712 Other spondylosis with myelopathy, cervical region: Secondary | ICD-10-CM

## 2018-05-02 DIAGNOSIS — M4714 Other spondylosis with myelopathy, thoracic region: Secondary | ICD-10-CM

## 2018-05-05 ENCOUNTER — Other Ambulatory Visit: Payer: Self-pay | Admitting: Nurse Practitioner

## 2018-05-10 ENCOUNTER — Encounter: Payer: Self-pay | Admitting: Internal Medicine

## 2018-06-09 ENCOUNTER — Ambulatory Visit: Payer: Medicare Other | Admitting: Internal Medicine

## 2018-06-09 ENCOUNTER — Encounter: Payer: Self-pay | Admitting: Internal Medicine

## 2018-06-09 VITALS — BP 118/88 | HR 86 | Ht 71.0 in | Wt 289.2 lb

## 2018-06-09 DIAGNOSIS — E782 Mixed hyperlipidemia: Secondary | ICD-10-CM | POA: Diagnosis not present

## 2018-06-09 DIAGNOSIS — I10 Essential (primary) hypertension: Secondary | ICD-10-CM

## 2018-06-09 DIAGNOSIS — I2510 Atherosclerotic heart disease of native coronary artery without angina pectoris: Secondary | ICD-10-CM

## 2018-06-09 DIAGNOSIS — R0602 Shortness of breath: Secondary | ICD-10-CM

## 2018-06-09 NOTE — Progress Notes (Signed)
Cardiology Office Note   Date:  06/09/2018   ID:  [name], DOB [birth year], MRN [id number]  PCP:  [name] Lima, MD  Cardiologist:   [name] Carnes, MD   F/U OF CAD    History of Present Illness: [name] [name] is a 72 y.o. male with a history of  Nonobstructive CAD  LVEF normal GERD, HTN, HL OSA, thoracic aneurysm  Mild 4.1 mm  Seen by V [name]   CT of aorta showed asc was 3.8 cm   Small pulm nodules unchanged    Pt having problems with unsteadiness  Back is a problem   Legs are weak   Jerelyn [name] Last one a couple weeks ago    Not recent    No presyncope  No blurred vision     Occasional chest discomfort R side  COmes an goes  No L sided CP    Does get SOB with activity   Has extremely difficult time walking   Current Meds  Medication Sig  . aspirin EC 81 MG tablet Take 1 tablet (81 mg total) by mouth 2 (two) times daily.  [name] GABAPENTIN PO Take 2 tablets by mouth daily.  . hydrochlorothiazide (HYDRODIURIL) 25 MG tablet Take 25 mg by mouth daily.   [name] levothyroxine (SYNTHROID, LEVOTHROID) 150 MCG tablet Take 150 mcg by mouth daily before breakfast.  . losartan (COZAAR) 50 MG tablet Take 50 mg by mouth daily.  [name] omeprazole (PRILOSEC) 40 MG capsule Take 1 capsule (40 mg total) by mouth daily before breakfast. 30 minutes before  . polyethylene glycol (MIRALAX) 0.34 gm/ml SOLN Take by mouth.  . simvastatin (ZOCOR) 20 MG tablet Take 1 tablet (20 mg total) by mouth daily at 6 PM. Please keep upcoming appt in December for future refills. Thank you  . Trospium Chloride 60 MG CP24 Take 60 mg by mouth daily.     Allergies:   Penicillins; Codeine; and Sulfonamide derivatives   Past Medical History:  Diagnosis Date  . Arthritis   . CAD (coronary artery disease)    cath 3/10: mLAD 30%, pRCA 30%, EF 55-60% Dr. Harrington Challenger cardiologist  most recent  stress stress done ~ 2 years ago with Dr. Harrington Challenger  . Complication of anesthesia    "he usually gets an ileus after back OR"   . Depression   . Dyspnea     -PFTs compeltely normal 03/20/08 including DLc0  . Environmental allergies    Dust, Smoke  . GERD (gastroesophageal reflux disease)   . HTN (hypertension)   . Hypothyroidism   . Low testosterone   . Observed sleep apnea    can't wear cpap  . Polycythemia   . PONV (postoperative nausea and vomiting)   . Pulmonary nodule 06/13/2012  . Rosacea   . Sleep apnea    Most recent sleep study 2010; records at [name] [name] office    Past Surgical History:  Procedure Laterality Date  . BACK SURGERY  2005; 06/2006; 01/2007; 04/2009  . CARDIAC CATHETERIZATION  08/2008  . CATARACT EXTRACTION, BILATERAL  2010  . CERVICAL FUSION  04/2002   ?C3-4  . COLONOSCOPY  12/2005   normal screening study.   . ESOPHAGOGASTRODUODENOSCOPY  multiple   last 07/2012 GERD esophagitis, 48 Fr dilation  . ESOPHAGOGASTRODUODENOSCOPY N/A 09/19/2015   Procedure: ESOPHAGOGASTRODUODENOSCOPY (EGD);  Surgeon: [name], MD;  Location: [facility] ENDOSCOPY;  Service: Endoscopy;  Laterality: N/A;  . KNEE ARTHROSCOPY WITH MEDIAL MENISECTOMY Right 05/14/2015   Procedure: RIGHT KNEE  ARTHROSCOPY CHONDROPLASTY, PARTIAL  MEDIAL MENISECTOMY;  Surgeon: [name], MD;  Location: [facility];  Service: Orthopedics;  Laterality: Right;  . LUMBAR DISC SURGERY  06/2006   L3  . MENISCUS REPAIR  08/2008   left  . POSTERIOR FUSION LUMBAR SPINE  2005; 01/2007; 04/2009   L5; L3-4; L2-3  . PROSTATE SURGERY  2000  . SAVORY DILATION N/A 09/19/2015   Procedure: SAVORY DILATION;  Surgeon: [name], MD;  Location: [facility] ENDOSCOPY;  Service: Endoscopy;  Laterality: N/A;  . SHOULDER OPEN ROTATOR CUFF REPAIR Left 1999   left  . TONSILLECTOMY  09/21/11  . TOTAL KNEE ARTHROPLASTY Left 01/2011   left  . TOTAL KNEE ARTHROPLASTY Right 08/08/2015   Procedure: RIGHT TOTAL KNEE ARTHROPLASTY;  Surgeon: [name], MD;  Location: [facility];  Service: Orthopedics;  Laterality: Right;  . TRANSURETHRAL RESECTION OF PROSTATE  2006   followed by  "surgery to get rid of clots"  . UVULOPALATOPHARYNGOPLASTY, TONSILLECTOMY AND SEPTOPLASTY  09/21/11   Deviated Septum     Social History:  The patient  reports that he quit smoking about 43 years ago. He has quit using smokeless tobacco. He reports that he does not drink alcohol or use drugs.   Family History:  The patient's family history includes Alzheimer's disease in his cousin and father; Bipolar disorder in an other family member; Breast cancer in his mother and sister; Breast cancer (age of onset: 38) in his sister; Cirrhosis in his maternal uncle; Colon polyps in his mother; Diabetes in his mother and sister; Heart Problems in his sister; Heart attack in his cousin, father, and paternal grandfather; Liver cancer in his maternal uncle; Lung cancer in his paternal uncle, paternal uncle, and paternal uncle; Other in his daughter and sister; Parkinson's disease in an other family member; Stroke in his paternal grandmother; Throat cancer in his maternal uncle.    ROS:  Please see the history of present illness. All other systems are reviewed and  Negative to the above problem except as noted.    PHYSICAL EXAM: VS:  BP 118/88   Pulse 86   Ht 5\' 11"  (1.803 m)   Wt 289 lb 3.2 oz (131.2 kg)   BMI 40.34 kg/m   GEN: Morbidly obese 72 yo in no acute distress  HEENT: normal  Neck: JVP is normal  No carotid bruits, or masses Cardiac: RRR; no murmurs, rubs, or gallops,  Tr   edema  Respiratory:  clear to auscultation bilaterally, normal work of breathing GI: soft, nontender, nondistended, + BS  No hepatomegaly  MS: No deformity   Deferred    Skin: warm and dry, no rash Neuro:  Strength and sensation are intact Psych: euthymic mood, full affect   EKG:  EKG is ordered  SR 86 bpm   RBBB  LAFB    First degree AVB lock  PR 200 msec     Lipid Panel No results found for: CHOL, TRIG, HDL, CHOLHDL, VLDL, LDLCALC, LDLDIRECT    Wt Readings from Last 3 Encounters:  06/09/18 289 lb 3.2 oz (131.2  kg)  05/27/17 278 lb 6.4 oz (126.3 kg)  07/05/16 273 lb (123.8 kg)      ASSESSMENT AND PLAN: 1  SOB   SOB with activity    Very SOB with walking   But very weak in legs   WOuld set up for echo to reeval diastolic properties   Also has sleep apnea in past   Had suregery  No study since  2 CAD  Occasional R sided pain  Not convinced angina   SOB   I am not convinced anginea  2  Aorta  Aorta at 3.8 cm  Will follow periodically    3  Lipids Contnie statin  4  HTN   BP is good   5  Neuo   Extreme gait abnormalities    F/U n the fall      Current medicines are reviewed at length with the patient today.  The patient does not have concerns regarding medicines.  Signed, [name] Carnes, MD  06/09/2018 8:30 AM    [address], [place], [place]  [address] Phone: [phone]; Fax: [phone]

## 2018-06-09 NOTE — Patient Instructions (Signed)
Medication Instructions:  No change If you need a refill on your cardiac medications before your next appointment, please call your pharmacy.   Lab work: none If you have labs (blood work) drawn today and your tests are completely normal, you will receive your results only by: [name] MyChart Message (if you have MyChart) OR . A paper copy in the mail If you have any lab test that is abnormal or we need to change your treatment, we will call you to review the results.  Testing/Procedures: Your physician has requested that you have an echocardiogram. Echocardiography is a painless test that uses sound waves to create images of your heart. It provides your doctor with information about the size and shape of your heart and how well your heart's chambers and valves are working. This procedure takes approximately one hour. There are no restrictions for this procedure.    Follow-Up: At [facility], you and your health needs are our priority.  As part of our continuing mission to provide you with exceptional heart care, we have created designated Provider Care Teams.  These Care Teams include your primary Cardiologist (physician) and Advanced Practice Providers (APPs -  Physician Assistants and Nurse Practitioners) who all work together to provide you with the care you need, when you need it. You will need a follow up appointment in:  12 months.  Please call our office 2 months in advance to schedule this appointment.  You may see [name], MD or one of the following Advanced Practice Providers on your designated Care Team: Richardson Dopp, PA-C [place], [place] . Daune Perch, NP  Any Other Special Instructions Will Be Listed Below (If Applicable).

## 2018-06-16 ENCOUNTER — Other Ambulatory Visit: Payer: Self-pay

## 2018-06-16 ENCOUNTER — Ambulatory Visit (HOSPITAL_COMMUNITY): Payer: Medicare Other | Attending: Internal Medicine

## 2018-06-16 DIAGNOSIS — R0602 Shortness of breath: Secondary | ICD-10-CM | POA: Diagnosis present

## 2018-06-16 DIAGNOSIS — I10 Essential (primary) hypertension: Secondary | ICD-10-CM | POA: Diagnosis present

## 2018-06-16 MED ORDER — PERFLUTREN LIPID MICROSPHERE
1.0000 mL | INTRAVENOUS | Status: AC | PRN
  Administered 2018-06-16: 2 mL via INTRAVENOUS

## 2018-09-18 ENCOUNTER — Encounter: Payer: Self-pay | Admitting: Internal Medicine

## 2018-09-18 ENCOUNTER — Ambulatory Visit (INDEPENDENT_AMBULATORY_CARE_PROVIDER_SITE_OTHER): Payer: Medicare Other | Admitting: Internal Medicine

## 2018-09-18 ENCOUNTER — Other Ambulatory Visit: Payer: Self-pay

## 2018-09-18 ENCOUNTER — Other Ambulatory Visit (INDEPENDENT_AMBULATORY_CARE_PROVIDER_SITE_OTHER): Payer: Medicare Other

## 2018-09-18 VITALS — BP 126/76 | HR 96 | Temp 98.3°F | Resp 16 | Ht 71.0 in | Wt 282.0 lb

## 2018-09-18 DIAGNOSIS — K5904 Chronic idiopathic constipation: Secondary | ICD-10-CM

## 2018-09-18 DIAGNOSIS — I77810 Thoracic aortic ectasia: Secondary | ICD-10-CM

## 2018-09-18 DIAGNOSIS — I10 Essential (primary) hypertension: Secondary | ICD-10-CM

## 2018-09-18 DIAGNOSIS — R7303 Prediabetes: Secondary | ICD-10-CM

## 2018-09-18 DIAGNOSIS — Z23 Encounter for immunization: Secondary | ICD-10-CM | POA: Diagnosis not present

## 2018-09-18 DIAGNOSIS — I739 Peripheral vascular disease, unspecified: Secondary | ICD-10-CM

## 2018-09-18 DIAGNOSIS — N400 Enlarged prostate without lower urinary tract symptoms: Secondary | ICD-10-CM

## 2018-09-18 DIAGNOSIS — Z0001 Encounter for general adult medical examination with abnormal findings: Secondary | ICD-10-CM | POA: Diagnosis not present

## 2018-09-18 DIAGNOSIS — E039 Hypothyroidism, unspecified: Secondary | ICD-10-CM

## 2018-09-18 DIAGNOSIS — Z1159 Encounter for screening for other viral diseases: Secondary | ICD-10-CM

## 2018-09-18 DIAGNOSIS — E785 Hyperlipidemia, unspecified: Secondary | ICD-10-CM

## 2018-09-18 DIAGNOSIS — E781 Pure hyperglyceridemia: Secondary | ICD-10-CM

## 2018-09-18 DIAGNOSIS — I2510 Atherosclerotic heart disease of native coronary artery without angina pectoris: Secondary | ICD-10-CM

## 2018-09-18 DIAGNOSIS — Z0000 Encounter for general adult medical examination without abnormal findings: Secondary | ICD-10-CM

## 2018-09-18 LAB — CBC WITH DIFFERENTIAL/PLATELET
BASOS ABS: 0.1 10*3/uL (ref 0.0–0.1)
Basophils Relative: 0.8 % (ref 0.0–3.0)
EOS PCT: 1.6 % (ref 0.0–5.0)
Eosinophils Absolute: 0.1 10*3/uL (ref 0.0–0.7)
HCT: 43.3 % (ref 39.0–52.0)
Hemoglobin: 14.5 g/dL (ref 13.0–17.0)
Lymphocytes Relative: 20.7 % (ref 12.0–46.0)
Lymphs Abs: 1.4 10*3/uL (ref 0.7–4.0)
MCHC: 33.4 g/dL (ref 30.0–36.0)
MCV: 85.8 fl (ref 78.0–100.0)
MONOS PCT: 11.2 % (ref 3.0–12.0)
Monocytes Absolute: 0.8 10*3/uL (ref 0.1–1.0)
Neutro Abs: 4.4 10*3/uL (ref 1.4–7.7)
Neutrophils Relative %: 65.7 % (ref 43.0–77.0)
Platelets: 332 10*3/uL (ref 150.0–400.0)
RBC: 5.04 Mil/uL (ref 4.22–5.81)
RDW: 15 % (ref 11.5–15.5)
WBC: 6.7 10*3/uL (ref 4.0–10.5)

## 2018-09-18 LAB — LIPID PANEL
Cholesterol: 108 mg/dL (ref 0–200)
HDL: 26.8 mg/dL — AB (ref >39.00)
LDL Cholesterol: 49 mg/dL (ref 0–99)
NONHDL: 80.87
Total CHOL/HDL Ratio: 4
Triglycerides: 157 mg/dL — ABNORMAL HIGH (ref 0.0–149.0)
VLDL: 31.4 mg/dL (ref 0.0–40.0)

## 2018-09-18 LAB — COMPREHENSIVE METABOLIC PANEL
ALT: 17 U/L (ref 0–53)
AST: 18 U/L (ref 0–37)
Albumin: 4 g/dL (ref 3.5–5.2)
Alkaline Phosphatase: 77 U/L (ref 39–117)
BILIRUBIN TOTAL: 0.6 mg/dL (ref 0.2–1.2)
BUN: 13 mg/dL (ref 6–23)
CO2: 29 mEq/L (ref 19–32)
Calcium: 9.5 mg/dL (ref 8.4–10.5)
Chloride: 95 mEq/L — ABNORMAL LOW (ref 96–112)
Creatinine, Ser: 0.84 mg/dL (ref 0.40–1.50)
GFR: 89.9 mL/min (ref >60.00)
Glucose, Bld: 95 mg/dL (ref 70–99)
Potassium: 4.4 mEq/L (ref 3.5–5.1)
Sodium: 131 mEq/L — ABNORMAL LOW (ref 135–145)
TOTAL PROTEIN: 8.1 g/dL (ref 6.0–8.3)

## 2018-09-18 LAB — URINALYSIS, ROUTINE W REFLEX MICROSCOPIC
Bilirubin Urine: NEGATIVE
Hgb urine dipstick: NEGATIVE
Ketones, ur: NEGATIVE
Leukocytes,Ua: NEGATIVE
Nitrite: NEGATIVE
RBC / HPF: NONE SEEN (ref 0–?)
Specific Gravity, Urine: 1.015 (ref 1.000–1.030)
Total Protein, Urine: NEGATIVE
Urine Glucose: NEGATIVE
Urobilinogen, UA: 0.2 (ref 0.0–1.0)
pH: 7 (ref 5.0–8.0)

## 2018-09-18 LAB — TSH: TSH: 1.33 u[IU]/mL (ref 0.35–4.50)

## 2018-09-18 LAB — POCT GLYCOSYLATED HEMOGLOBIN (HGB A1C): Hemoglobin A1C: 5.9 % — AB (ref 4.0–5.6)

## 2018-09-18 MED ORDER — SIMVASTATIN 20 MG PO TABS: 20.0000 mg | ORAL_TABLET | Freq: Every day | ORAL | 1 refills | Status: DC

## 2018-09-18 MED ORDER — LEVOTHYROXINE SODIUM 150 MCG PO TABS: 150.0000 ug | ORAL_TABLET | Freq: Every day | ORAL | 0 refills | Status: DC

## 2018-09-18 MED ORDER — ICOSAPENT ETHYL 1 G PO CAPS: 2.0000 | ORAL_CAPSULE | Freq: Two times a day (BID) | ORAL | 1 refills | Status: DC

## 2018-09-18 MED ORDER — PRUCALOPRIDE SUCCINATE 2 MG PO TABS: 1.0000 | ORAL_TABLET | Freq: Every day | ORAL | 1 refills | Status: DC

## 2018-09-18 NOTE — Progress Notes (Signed)
Subjective:  Patient ID: [name] [name], male    DOB: [birth year]  Age: 73 y.o. MRN: [id number]  CC: Hypertension; Hypothyroidism; Hyperlipidemia; Coronary Artery Disease; and Annual Exam  NEW TO ME  HPI [name] [name] presents for a CPX.  Complains of chronic dyspnea on exertion.  He saw cardiology about 3 months ago and is undergone a cardiac evaluation.  He denies chest pain, palpitations, diaphoresis, or lower extremity edema.  He has become concerned about a discoloration on the dorsal, distal aspect of his right foot.  He is also developed claudication over the last few months.  History Arnet has a past medical history of Arthritis, CAD (coronary artery disease), Complication of anesthesia, Depression, Dyspnea, Environmental allergies, GERD (gastroesophageal reflux disease), HTN (hypertension), Hypothyroidism, Low testosterone, Observed sleep apnea, Polycythemia, PONV (postoperative nausea and vomiting), Pulmonary nodule (06/13/2012), Rosacea, and Sleep apnea.   He has a past surgical history that includes Cataract extraction, bilateral (2010); Meniscus repair (08/2008); Tonsillectomy (09/21/11); Uvulopalatopharyngoplasty, tonsillectomy and septoplasty (09/21/11); Posterior fusion lumbar spine (2005; 01/2007; 04/2009); Lumbar disc surgery (06/2006); Back surgery (2005; 06/2006; 01/2007; 04/2009); Prostate surgery (2000); Transurethral resection of prostate (2006); Shoulder open rotator cuff repair (Left, 1999); Cervical fusion (04/2002); Total knee arthroplasty (Left, 01/2011); Cardiac catheterization (08/2008); Esophagogastroduodenoscopy (multiple); Colonoscopy (12/2005); Knee arthroscopy with medial menisectomy (Right, 05/14/2015); Total knee arthroplasty (Right, 08/08/2015); Esophagogastroduodenoscopy (N/A, 09/19/2015); and Savory dilation (N/A, 09/19/2015).   His family history includes Alzheimer's disease in his cousin and father; Bipolar disorder in an other family member; Breast cancer in his mother  and sister; Breast cancer (age of onset: 55) in his sister; Cirrhosis in his maternal uncle; Colon polyps in his mother; Diabetes in his mother and sister; Heart Problems in his sister; Heart attack in his cousin, father, and paternal grandfather; Liver cancer in his maternal uncle; Lung cancer in his paternal uncle, paternal uncle, and paternal uncle; Other in his daughter and sister; Parkinson's disease in an other family member; Stroke in his paternal grandmother; Throat cancer in his maternal uncle.He reports that he quit smoking about 44 years ago. He has quit using smokeless tobacco. He reports that he does not drink alcohol or use drugs.  Outpatient Medications Prior to Visit  Medication Sig Dispense Refill  . aspirin EC 81 MG tablet Take 1 tablet (81 mg total) by mouth 2 (two) times daily.    . DULoxetine (CYMBALTA) 30 MG capsule     . gabapentin (NEURONTIN) 300 MG capsule TAKE 1 CAPSULE BY MOUTH TWICE PER DAY    . levothyroxine (SYNTHROID, LEVOTHROID) 150 MCG tablet Take 150 mcg by mouth daily before breakfast.    . losartan (COZAAR) 50 MG tablet Take 50 mg by mouth daily.    [name] omeprazole (PRILOSEC) 40 MG capsule Take 1 capsule (40 mg total) by mouth daily before breakfast. 30 minutes before 90 capsule 3  . polyethylene glycol (MIRALAX) 0.34 gm/ml SOLN Take by mouth.    . hydrochlorothiazide (HYDRODIURIL) 25 MG tablet Take 25 mg by mouth daily.     . simvastatin (ZOCOR) 20 MG tablet Take 1 tablet (20 mg total) by mouth daily at 6 PM. Please keep upcoming appt in December for future refills. Thank you 90 tablet 0  . Trospium Chloride 60 MG CP24 Take 60 mg by mouth daily.    [name] GABAPENTIN PO Take 2 tablets by mouth daily.     No facility-administered medications prior to visit.     ROS Review of Systems  Constitutional: Positive for  fatigue. Negative for diaphoresis and unexpected weight change.  HENT: Negative.   Eyes: Negative for visual disturbance.  Respiratory: Positive for  shortness of breath. Negative for cough, chest tightness and wheezing.   Cardiovascular: Negative for chest pain, palpitations and leg swelling.  Gastrointestinal: Positive for constipation. Negative for abdominal pain, diarrhea, nausea and vomiting.       He suffers from chronic constipation.  He says his stools are hard and dry and he sometimes goes for 6 days without having a bowel movement.  He has tried over-the-counter doses of Colace and MiraLAX without any improvement in his symptoms.  Endocrine: Negative.  Negative for cold intolerance and heat intolerance.  Genitourinary: Negative.  Negative for difficulty urinating and dysuria.  Musculoskeletal: Positive for back pain and gait problem. Negative for myalgias and neck pain.  Skin: Negative.   Neurological: Negative for dizziness, weakness, light-headedness and headaches.  Hematological: Negative for adenopathy. Does not bruise/bleed easily.  Psychiatric/Behavioral: Negative.     Objective:  BP 126/76 (BP Location: Left Arm, Patient Position: Sitting, Cuff Size: Large)   Pulse 96   Temp 98.3 F (36.8 C) (Oral)   Resp 16   Ht 5\' 11"  (1.803 m)   Wt 282 lb (127.9 kg)   SpO2 96%   BMI 39.33 kg/m   Physical Exam Vitals signs reviewed.  Constitutional:      Appearance: He is obese. He is not ill-appearing or diaphoretic.  HENT:     Nose: No congestion or rhinorrhea.     Mouth/Throat:     Mouth: Mucous membranes are moist.     Pharynx: Oropharynx is clear. No oropharyngeal exudate or posterior oropharyngeal erythema.  Eyes:     General: No scleral icterus.    Conjunctiva/sclera: Conjunctivae normal.  Neck:     Musculoskeletal: Normal range of motion and neck supple. No muscular tenderness.  Cardiovascular:     Rate and Rhythm: Normal rate and regular rhythm.     Pulses:          Dorsalis pedis pulses are 0 on the right side and 1+ on the left side.       Posterior tibial pulses are 1+ on the right side and 1+ on the left  side.     Heart sounds: No murmur. No gallop.   Pulmonary:     Effort: Pulmonary effort is normal.     Breath sounds: No stridor. No wheezing, rhonchi or rales.  Abdominal:     General: Bowel sounds are normal.     Palpations: There is no hepatomegaly, splenomegaly or mass.     Tenderness: There is no abdominal tenderness. There is no guarding.  Genitourinary:    Comments: GU and rectal exams were deferred.  He tells me he is seeing his urologist in a few weeks and that the urologist will complete these exams. Musculoskeletal: Normal range of motion.        General: No swelling.     Right lower leg: No edema.     Left lower leg: No edema.  Feet:     Right foot:     Skin integrity: Erythema and dry skin present. No ulcer, blister, skin breakdown, warmth, callus or fissure.     Left foot:     Skin integrity: Dry skin present. No ulcer, blister, skin breakdown, erythema, warmth, callus or fissure.     Comments: Dorsal, distal surface of right foot shows diffuse erythema but the skin temperature is cool. Lymphadenopathy:  Cervical: No cervical adenopathy.  Skin:    General: Skin is warm and dry.     Findings: Erythema present.  Neurological:     General: No focal deficit present.     Mental Status: He is oriented to person, place, and time. Mental status is at baseline.  Psychiatric:        Mood and Affect: Mood normal.     Lab Results  Component Value Date   WBC 6.7 09/18/2018   HGB 14.5 09/18/2018   HCT 43.3 09/18/2018   PLT 332.0 09/18/2018   GLUCOSE 95 09/18/2018   CHOL 108 09/18/2018   TRIG 157.0 (H) 09/18/2018   HDL 26.80 (L) 09/18/2018   LDLCALC 49 09/18/2018   ALT 17 09/18/2018   AST 18 09/18/2018   NA 131 (L) 09/18/2018   K 4.4 09/18/2018   CL 95 (L) 09/18/2018   CREATININE 0.84 09/18/2018   BUN 13 09/18/2018   CO2 29 09/18/2018   TSH 1.33 09/18/2018   INR 1.03 07/29/2015   HGBA1C 5.9 (A) 09/18/2018    Assessment & Plan:   Eriberto was seen today for  hypertension, hypothyroidism, hyperlipidemia, coronary artery disease and annual exam.  Diagnoses and all orders for this visit:  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[place])- I will work on risk factor modifications. -     Lipid panel; Future -     simvastatin (ZOCOR) 20 MG tablet; Take 1 tablet (20 mg total) by mouth daily at 6 PM. Please keep upcoming appt in December for future refills. Thank you  Essential hypertension- His blood pressure is adequately well controlled but his sodium and chloride levels are low so I have asked him to stop taking hydrochlorothiazide.  I have asked him to return in 1 to 2 months for me to recheck his blood pressure and to recheck his electrolytes. -     CBC with Differential/Platelet; Future -     Comprehensive metabolic panel; Future  Hypothyroidism, unspecified type- His TSH is in the normal range.  He will remain on the current dose of levothyroxine. -     TSH; Future  Benign prostatic hyperplasia without lower urinary tract symptoms- He is status post TURP.  This is followed by urology. -     Urinalysis, Routine w reflex microscopic; Future -     Cancel: PSA; Future  Prediabetes- His A1c is at 5.9%.  Medical therapy is not indicated. -     Comprehensive metabolic panel; Future -     Cancel: Hemoglobin A1c; Future -     POCT glycosylated hemoglobin (Hb A1C)  Need for hepatitis C screening test -     Hepatitis C antibody; Future  Claudication of both lower extremities ([place])- I have asked him to undergo arterial flow studies to see if he has PAD. -     VAS [name] ABI WITH/WO TBI; Future  Need for Tdap vaccination -     Tdap vaccine greater than or equal to 72yo IM  Chronic idiopathic constipation -     Prucalopride Succinate (MOTEGRITY) 2 MG TABS; Take 1 tablet by mouth daily.  Hyperlipidemia LDL goal <70 -     simvastatin (ZOCOR) 20 MG tablet; Take 1 tablet (20 mg total) by mouth daily at 6 PM. Please keep upcoming appt in December for future refills. Thank  you  Pure hyperglyceridemia -     Icosapent Ethyl (VASCEPA) 1 g CAPS; Take 2 capsules (2 g total) by mouth 2 (two) times daily.  Atherosclerosis of  native coronary artery of native heart without angina pectoris -     Icosapent Ethyl (VASCEPA) 1 g CAPS; Take 2 capsules (2 g total) by mouth 2 (two) times daily.   I have discontinued Teancum D. Luck's hydrochlorothiazide, Trospium Chloride, and GABAPENTIN PO. I am also having him start on Prucalopride Succinate and Icosapent Ethyl. Additionally, I am having him maintain his levothyroxine, losartan, aspirin EC, omeprazole, polyethylene glycol, gabapentin, DULoxetine, and simvastatin.  Meds ordered this encounter  Medications  . Prucalopride Succinate (MOTEGRITY) 2 MG TABS    Sig: Take 1 tablet by mouth daily.    Dispense:  90 tablet    Refill:  1  . simvastatin (ZOCOR) 20 MG tablet    Sig: Take 1 tablet (20 mg total) by mouth daily at 6 PM. Please keep upcoming appt in December for future refills. Thank you    Dispense:  90 tablet    Refill:  1  . Icosapent Ethyl (VASCEPA) 1 g CAPS    Sig: Take 2 capsules (2 g total) by mouth 2 (two) times daily.    Dispense:  360 capsule    Refill:  1     Follow-up: Return in about 6 months (around 03/21/2019).  Scarlette Calico, MD

## 2018-09-18 NOTE — Assessment & Plan Note (Signed)

## 2018-09-18 NOTE — Addendum Note (Signed)
Addended by: [name] on: 09/18/2018 01:45 PM   Modules accepted: Level of Service

## 2018-09-18 NOTE — Patient Instructions (Signed)

## 2018-09-19 LAB — HEPATITIS C ANTIBODY
Hepatitis C Ab: NONREACTIVE
SIGNAL TO CUT-OFF: 0.39 (ref <1.00)

## 2018-09-20 ENCOUNTER — Telehealth: Payer: Self-pay

## 2018-09-20 NOTE — Telephone Encounter (Signed)
[facility] is asking how important scan for pt is? Please advise.

## 2018-09-20 NOTE — Telephone Encounter (Signed)
Copied from [place] [id number]. Topic: Referral - Question >> Sep 20, 2018  1:34 PM Sheran Luz wrote: Reason for CRM: Crystal from [facility] calling to inquire about referral. She would like to know if it is urgent, because if not there is an estimated 4 week wait for an appointment due to [phone]. She is requesting a call back from CMA to discuss further.    [phone]

## 2018-09-20 NOTE — Telephone Encounter (Signed)
[name] [name] spoke to [facility]. They will get pt in asap!!

## 2018-09-20 NOTE — Telephone Encounter (Signed)
Key: [id number]  PA has been approved.  Detailed message left for pt informing of same and to call pharmacy to have them fill the medication at his convenience.

## 2018-09-20 NOTE — Telephone Encounter (Signed)
I think it is important.  I could not get a pulse on the top of his foot and his foot was cold and blue.

## 2018-09-21 ENCOUNTER — Telehealth (HOSPITAL_COMMUNITY): Payer: Self-pay | Admitting: Rehabilitation

## 2018-09-21 NOTE — Telephone Encounter (Signed)
The above patient or their representative was contacted and gave the following answers to these questions:         Do you have any of the following symptoms? No  Fever                    Cough                   Shortness of breath  Do  you have any of the following other symptoms? No    muscle pain         vomiting,        diarrhea        rash         weakness        red eye        abdominal pain         bruising          bruising or bleeding              joint pain           severe headache    Have you been in contact with someone who was or has been sick in the past 2 weeks? No  Yes                 Unsure                         Unable to assess   Does the person that you were in contact with have any of the following symptoms? N/A  Cough         shortness of breath           muscle pain         vomiting,            diarrhea            rash            weakness           fever            red eye           abdominal pain           bruising  or  bleeding                joint pain                severe headache               Have you  or someone you have been in contact with traveled internationally in the last month?   No      If yes, which countries?   Have you  or someone you have been in contact with traveled outside [place] in the last month?  No       If yes, which state and city?   COMMENTS OR ACTION PLAN FOR THIS PATIENT:

## 2018-09-22 ENCOUNTER — Other Ambulatory Visit: Payer: Self-pay

## 2018-09-22 ENCOUNTER — Ambulatory Visit (HOSPITAL_COMMUNITY)
Admission: RE | Admit: 2018-09-22 | Discharge: 2018-09-22 | Disposition: A | Discharge status: Home / Self Care | Payer: Medicare Other | Source: Ambulatory Visit | Attending: Family | Admitting: Family

## 2018-09-22 DIAGNOSIS — I739 Peripheral vascular disease, unspecified: Secondary | ICD-10-CM | POA: Diagnosis present

## 2018-10-17 ENCOUNTER — Other Ambulatory Visit: Payer: Self-pay | Admitting: Internal Medicine

## 2018-10-17 DIAGNOSIS — I10 Essential (primary) hypertension: Secondary | ICD-10-CM

## 2018-10-17 DIAGNOSIS — E871 Hypo-osmolality and hyponatremia: Secondary | ICD-10-CM | POA: Insufficient documentation

## 2018-11-06 ENCOUNTER — Telehealth: Payer: Self-pay | Admitting: Internal Medicine

## 2018-11-06 NOTE — Telephone Encounter (Signed)
I spoke with pt's wife. She reports pt was out in yard and going up ramp to building. He reached for door and fell backwards onto right shoulder.  May have felt a little dizzy prior to falling.  EMS evaluated pt. Wife read EKG findings from EMS as outlined below. Pt was not having any palpitations or feeling of skipped beats.  Wife reports pt has severe problems with balance due to neck and back issues.  Has fallen in this spot previously.  Today he is feeling fine and back to normal. Will send to APP in office for review/recommendations.

## 2018-11-06 NOTE — Telephone Encounter (Signed)
New Message    Pts wife is calling and said he was working in the yard Saturday and fell and wife called EMT because he also threw up, she said he did not hit his head, he hit his shoulder.   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 EMT suggested he go to hospital but pt refused due to covid.  EMT also told her that his cardiologist may already be treating him for these conditions but they are unsure   Please call

## 2018-11-06 NOTE — Telephone Encounter (Signed)
I spoke with pt and his wife and arranged video visit for May 12,2020 at 1:45 with Richardson Dopp, PA. Consent obtained.   Virtual Visit Pre-Appointment Phone Call  "(Name), I am calling you today to discuss your upcoming appointment. We are currently trying to limit exposure to the virus that causes COVID-19 by seeing patients at home rather than in the office."  "What is the BEST phone number to call the day of the visit?" -[phone].   1. "Do you have or have access to (through a family member/friend) a smartphone with video capability that we can use for your visit?" yes a. If yes - list this number in appt notes as "cell" (if different from BEST phone #) and list the appointment type as a VIDEO visit in appointment notes b. If no - list the appointment type as a PHONE visit in appointment notes  2. Confirm consent - "In the setting of the current Covid19 crisis, you are scheduled for a video visit with your provider on May 12,2020 at 1:45.  Just as we do with many in-office visits, in order for you to participate in this visit, we must obtain consent.  If you'd like, I can send this to your mychart (if signed up) or email for you to review.  Otherwise, I can obtain your verbal consent now.  All virtual visits are billed to your insurance company just like a normal visit would be.  By agreeing to a virtual visit, we'd like you to understand that the technology does not allow for your provider to perform an examination, and thus may limit your provider's ability to fully assess your condition. If your provider identifies any concerns that need to be evaluated in person, we will make arrangements to do so.  Finally, though the technology is pretty good, we cannot assure that it will always work on either your or our end, and in the setting of a video visit, we may have to convert it to a phone-only visit.  In either situation, we cannot ensure that we have a secure connection.  Are you willing to  proceed?" STAFF: Did the patient verbally acknowledge consent to telehealth visit? Document YES/NO here: yes  3. Advise patient to be prepared - "Two hours prior to your appointment, go ahead and check your blood pressure, pulse, oxygen saturation, and your weight (if you have the equipment to check those) and write them all down. When your visit starts, your provider will ask you for this information. If you have an Apple Watch or Kardia device, please plan to have heart rate information ready on the day of your appointment. Please have a pen and paper handy nearby the day of the visit as well."  4. Give patient instructions for MyChart download to smartphone OR Doximity/Doxy.me as below if video visit (depending on what platform provider is using)  5. Inform patient they will receive a phone call 15 minutes prior to their appointment time (may be from unknown caller ID) so they should be prepared to answer    TELEPHONE CALL NOTE  Malone D Nott has been deemed a candidate for a follow-up tele-health visit to limit community exposure during the Covid-19 pandemic. I spoke with the patient via phone to ensure availability of phone/video source, confirm preferred email & phone number, and discuss instructions and expectations.  I reminded [name] to be prepared with any vital sign and/or heart rhythm information that could potentially be obtained via home monitoring, at  the time of his visit. I reminded [name] to expect a phone call prior to his visit.  Leodis Liverpool, RN 11/06/2018 10:23 AM   INSTRUCTIONS FOR DOWNLOADING THE MYCHART APP TO SMARTPHONE  - The patient must first make sure to have activated MyChart and know their login information - If Apple, go to [employer] and type in MyChart in the search bar and download the app. If Android, ask patient to go to [employer] and type in [place] in the search bar and download the app. The app is free but as with any other app  downloads, their phone may require them to verify saved payment information or Apple/Android password.  - The patient will need to then log into the app with their MyChart username and password, and select [facility] as their healthcare provider to link the account. When it is time for your visit, go to the MyChart app, find appointments, and click Begin Video Visit. Be sure to Select Allow for your device to access the Microphone and Camera for your visit. You will then be connected, and your provider will be with you shortly.  **If they have any issues connecting, or need assistance please contact MyChart service desk [phone])**  **If using a computer, in order to ensure the best quality for their visit they will need to use either of the following Internet Browsers: [employer], or Google Chrome**  IF USING DOXIMITY or DOXY.ME - The patient will receive a link just prior to their visit by text.     FULL LENGTH CONSENT FOR TELE-HEALTH VISIT   I hereby voluntarily request, consent and authorize [facility] and its employed or contracted physicians, physician assistants, nurse practitioners or other licensed health care professionals (the Practitioner), to provide me with telemedicine health care services (the "Services") as deemed necessary by the treating Practitioner. I acknowledge and consent to receive the Services by the Practitioner via telemedicine. I understand that the telemedicine visit will involve communicating with the Practitioner through live audiovisual communication technology and the disclosure of certain medical information by electronic transmission. I acknowledge that I have been given the opportunity to request an in-person assessment or other available alternative prior to the telemedicine visit and am voluntarily participating in the telemedicine visit.  I understand that I have the right to withhold or withdraw my consent to the use of telemedicine  in the course of my care at any time, without affecting my right to future care or treatment, and that the Practitioner or I may terminate the telemedicine visit at any time. I understand that I have the right to inspect all information obtained and/or recorded in the course of the telemedicine visit and may receive copies of available information for a reasonable fee.  I understand that some of the potential risks of receiving the Services via telemedicine include:  [name] Delay or interruption in medical evaluation due to technological equipment failure or disruption; . Information transmitted may not be sufficient (e.g. poor resolution of images) to allow for appropriate medical decision making by the Practitioner; and/or  . In rare instances, security protocols could fail, causing a breach of personal health information.  Furthermore, I acknowledge that it is my responsibility to provide information about my medical history, conditions and care that is complete and accurate to the best of my ability. I acknowledge that Practitioner's advice, recommendations, and/or decision may be based on factors not within their control, such as incomplete or inaccurate data  provided by me or distortions of diagnostic images or specimens that may result from electronic transmissions. I understand that the practice of medicine is not an exact science and that Practitioner makes no warranties or guarantees regarding treatment outcomes. I acknowledge that I will receive a copy of this consent concurrently upon execution via email to the email address I last provided but may also request a printed copy by calling the office of [facility].    I understand that my insurance will be billed for this visit.   I have read or had this consent read to me. . I understand the contents of this consent, which adequately explains the benefits and risks of the Services being provided via telemedicine.  . I have been provided ample  opportunity to ask questions regarding this consent and the Services and have had my questions answered to my satisfaction. . I give my informed consent for the services to be provided through the use of telemedicine in my medical care  By participating in this telemedicine visit I agree to the above.

## 2018-11-06 NOTE — Telephone Encounter (Signed)
Message received from triage.  Chart reviewed.  Patient has a history of a right bundle branch block and left anterior fascicular block.  I do not see a history of atrial arrhythmias.  Please set up for a virtual visit, video preferred, with an APP on Dr. Alan Ripper team for consideration of a heart monitor.  Tami Lin Wilver Tignor, PA-C 11/06/2018, 10:07 AM

## 2018-11-06 NOTE — Progress Notes (Signed)
Virtual Visit via Telephone Note   This visit type was conducted due to national recommendations for restrictions regarding the COVID-19 Pandemic (e.g. social distancing) in an effort to limit this patient's exposure and mitigate transmission in our community.  Due to his co-morbid illnesses, this patient is at least at moderate risk for complications without adequate follow up.  This format is felt to be most appropriate for this patient at this time.  The patient did not have access to video technology/had technical difficulties with video requiring transitioning to audio format only (telephone).  All issues noted in this document were discussed and addressed.  No physical exam could be performed with this format.  Please refer to the patient's chart for his  consent to telehealth for [facility].   Date:  11/07/2018   ID:  [name] [name], DOB 28-Jun-1946, MRN [id number]  Patient Location: Home Provider Location: Home  PCP:  [name], MD  Cardiologist:  [name], MD   Electrophysiologist:  None   Evaluation Performed:  Follow-Up Visit  Chief Complaint:  Rhythm problem  History of Present Illness:    [name] [name] is a 73 y.o. male with non-obstructive coronary artery disease, hypertension, hyperlipidemia, obstructive sleep apnea, thoracic aortic aneurysm.  He was last seen by Dr. Harrington Challenger in 05/2018.    He has a hx of multiple back and neck surgeries and falls often.  He fell again this past Saturday.  He had significant trouble standing up and had to call EMS.  While he was trying to get up, he became nauseated and vomited.  He was told by EMS that his EKG was abnormal and he should follow-up with cardiology.  His wife read out to me the interpretation and it seems to indicate he has an ectopic atrial rhythm with right bundle branch block and left anterior fascicular block.  He denies a history of syncope or near syncope.  He usually loses his balance which contributes to his fall.   He has chronic shortness of breath with minimal activity.  He denies orthopnea.  He is supposed to sleep with CPAP but does not.  He has some occasional right leg swelling without significant change.  The patient does not have symptoms concerning for COVID-19 infection (fever, chills, cough, or new shortness of breath).    Past Medical History:  Diagnosis Date   Arthritis    CAD (coronary artery disease)    cath 3/10: mLAD 30%, pRCA 30%, EF 55-60% Dr. Harrington Challenger cardiologist  most recent  stress stress done ~ 2 years ago with Dr. Harrington Challenger   Complication of anesthesia    "he usually gets an ileus after back OR"    Depression    Dyspnea    -PFTs compeltely normal 03/20/08 including DLc0   Environmental allergies    Dust, Smoke   GERD (gastroesophageal reflux disease)    HTN (hypertension)    Hypothyroidism    Low testosterone    Observed sleep apnea    can't wear cpap   Polycythemia    PONV (postoperative nausea and vomiting)    Pulmonary nodule 06/13/2012   Rosacea    Sleep apnea    Most recent sleep study 2010; records at [facility]   Past Surgical History:  Procedure Laterality Date   BACK SURGERY  2005; 06/2006; 01/2007; 04/2009   CARDIAC CATHETERIZATION  08/2008   CATARACT EXTRACTION, BILATERAL  2010   CERVICAL FUSION  04/2002   ?C3-4   COLONOSCOPY  12/2005   normal screening study.    ESOPHAGOGASTRODUODENOSCOPY  multiple   last 07/2012 GERD esophagitis, 48 Fr dilation   ESOPHAGOGASTRODUODENOSCOPY N/A 09/19/2015   Procedure: ESOPHAGOGASTRODUODENOSCOPY (EGD);  Surgeon: [name], MD;  Location: [facility] ENDOSCOPY;  Service: Endoscopy;  Laterality: N/A;   KNEE ARTHROSCOPY WITH MEDIAL MENISECTOMY Right 05/14/2015   Procedure: RIGHT KNEE ARTHROSCOPY CHONDROPLASTY, PARTIAL  MEDIAL MENISECTOMY;  Surgeon: [name], MD;  Location: [facility];  Service: Orthopedics;  Laterality: Right;   LUMBAR DISC SURGERY  06/2006   L3   MENISCUS REPAIR   08/2008   left   POSTERIOR FUSION LUMBAR SPINE  2005; 01/2007; 04/2009   L5; L3-4; L2-3   PROSTATE SURGERY  2000   SAVORY DILATION N/A 09/19/2015   Procedure: SAVORY DILATION;  Surgeon: [name], MD;  Location: [facility] ENDOSCOPY;  Service: Endoscopy;  Laterality: N/A;   SHOULDER OPEN ROTATOR CUFF REPAIR Left 1999   left   TONSILLECTOMY  09/21/11   TOTAL KNEE ARTHROPLASTY Left 01/2011   left   TOTAL KNEE ARTHROPLASTY Right 08/08/2015   Procedure: RIGHT TOTAL KNEE ARTHROPLASTY;  Surgeon: [name], MD;  Location: [facility];  Service: Orthopedics;  Laterality: Right;   TRANSURETHRAL RESECTION OF PROSTATE  2006   followed by "surgery to get rid of clots"   UVULOPALATOPHARYNGOPLASTY, TONSILLECTOMY AND SEPTOPLASTY  09/21/11   Deviated Septum     Current Meds  Medication Sig   aspirin EC 81 MG tablet Take 1 tablet (81 mg total) by mouth 2 (two) times daily.   DULoxetine (CYMBALTA) 30 MG capsule Take 30 mg by mouth daily.    gabapentin (NEURONTIN) 300 MG capsule TAKE 1 CAPSULE BY MOUTH TWICE PER DAY   Icosapent Ethyl (VASCEPA) 1 g CAPS Take 2 capsules (2 g total) by mouth 2 (two) times daily.   levothyroxine (SYNTHROID, LEVOTHROID) 150 MCG tablet Take 1 tablet (150 mcg total) by mouth daily before breakfast.   losartan (COZAAR) 50 MG tablet Take 50 mg by mouth daily.   omeprazole (PRILOSEC) 40 MG capsule Take 1 capsule (40 mg total) by mouth daily before breakfast. 30 minutes before   polyethylene glycol (MIRALAX) 0.34 gm/ml SOLN Take 0.5 g/kg by mouth as needed.    simvastatin (ZOCOR) 20 MG tablet Take 1 tablet (20 mg total) by mouth daily at 6 PM. Please keep upcoming appt in December for future refills. Thank you   Trospium Chloride 60 MG CP24 Take 1 capsule by mouth daily.     Allergies:   Penicillins; Codeine; and Sulfonamide derivatives   Social History   Tobacco Use   Smoking status: Former Smoker    Last attempt to quit: 07/17/1974    Years since quitting: 44.3    Smokeless tobacco: Former Systems developer  Substance Use Topics   Alcohol use: No   Drug use: No     Family Hx: The patient's family history includes Alzheimer's disease in his cousin and father; Bipolar disorder in an other family member; Breast cancer in his mother and sister; Breast cancer (age of onset: 60) in his sister; Cirrhosis in his maternal uncle; Colon polyps in his mother; Diabetes in his mother and sister; Heart Problems in his sister; Heart attack in his cousin, father, and paternal grandfather; Liver cancer in his maternal uncle; Lung cancer in his paternal uncle, paternal uncle, and paternal uncle; Other in his daughter and sister; Parkinson's disease in an other family member; Stroke in his paternal grandmother; Throat cancer in his maternal  uncle. There is no history of Anesthesia problems.  ROS:   Please see the history of present illness.     All other systems reviewed and are negative.   Prior CV studies:   The following studies were reviewed today:  Echo 06/16/2018 Mod focal basal septal hypertrophy, EF 55-60, no RWMA, Gr 1 DD, +SAM, no LVOT obstruction, trivial MR, normal RSVF, trivial TR  AAA [name] 10/24/16 Normal caliber abd aorta  Chest CTA 10/04/16 IMPRESSION: Maximal diameter of the ascending aorta is 3.8 cm compared with 4.1 cm on the prior study. This is probably not significantly changed allowing for differences in measuring technique. Multiple small pulmonary nodules are not significantly changed supporting benign etiology. Calcified pleural plaques are stable.   Cardiac Catheterization 08/26/2008 LM normal  LAD mid 30 RCA prox 30 EF 55-60   Labs/Other Tests and Data Reviewed:    EKG:  An ECG dated 06/09/2018 was personally reviewed today and demonstrated:  Normal sinus rhythm, heart rate 86, left anterior fascicular block, right bundle branch block, QTC 457  Recent Labs: 09/18/2018: ALT 17; BUN 13; Creatinine, Ser 0.84; Hemoglobin 14.5; Platelets  332.0; Potassium 4.4; Sodium 131; TSH 1.33   Recent Lipid Panel Lab Results  Component Value Date/Time   CHOL 108 09/18/2018 09:56 AM   TRIG 157.0 (H) 09/18/2018 09:56 AM   HDL 26.80 (L) 09/18/2018 09:56 AM   CHOLHDL 4 09/18/2018 09:56 AM   LDLCALC 49 09/18/2018 09:56 AM      Wt Readings from Last 3 Encounters:  11/07/18 278 lb (126.1 kg)  09/18/18 282 lb (127.9 kg)  06/09/18 289 lb 3.2 oz (131.2 kg)     Objective:    Vital Signs:  Ht 5\' 11"  (1.803 m)    Wt 278 lb (126.1 kg)    BMI 38.77 kg/m    VITAL SIGNS:  reviewed GEN:  no acute distress RESPIRATORY:  No labored breathing NEURO:  Alert and oriented PSYCH:  He seems to be in good spirits  ASSESSMENT & PLAN:    Cardiac arrhythmia, unspecified cardiac arrhythmia type As noted, the patient fell this past weekend.  EMS came out and his EKG was noted to be abnormal.  He was asked to follow-up with cardiology.  Given the limitations of telehealth technology, I am unable to review his EKG.  The interpretation was read to me and it sounds as though it may not be much different from his previous EKG.  However, the patient does fall quite frequently.  He had some vomiting during this most recent episode.  I have recommended that we obtain an event monitor to rule out significant arrhythmias.  This will be mailed to his house and we will follow-up with him in about 6 to 8 weeks.  He will try to mail me a copy of the EKG obtained by EMS.  Coronary artery disease involving native coronary artery of native heart without angina pectoris Nonobstructive coronary artery disease by cardiac catheterization in 2010.  He is not really having any anginal symptoms.  He does note chronic shortness of breath.  I suspect that this is related to deconditioning and obesity.  Continue aspirin, statin.  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HCC) 3.8 cm by CT in 2018.  Follow-up CT can be arranged at some point in the near future, once we are able to see patients  back in the office on a regular basis.  Essential hypertension He is unable to check his blood pressure at home.  I will  arrange for him to have a cuff mailed to his house through our [facility].  He does describe some episodes of dizziness.  I suspect that this is related to his chronic back issues and imbalance.  However, if his blood pressure is running low, we can cut back on his losartan.  Hyperlipidemia LDL goal <70 Continue statin therapy.  Educated About Covid-19 Virus Infection The signs and symptoms of COVID-19 were discussed with the patient and how to seek care for testing (follow up with PCP or arrange E-visit).  The importance of social distancing was discussed today.  Time:   Today, I have spent 18 minutes with the patient with telehealth technology discussing the above problems.     Medication Adjustments/Labs and Tests Ordered: Current medicines are reviewed at length with the patient today.  Concerns regarding medicines are outlined above.   Tests Ordered: Orders Placed This Encounter  Procedures   Cardiac event monitor    Medication Changes: No orders of the defined types were placed in this encounter.   Disposition:  Follow up in 8 week(s)  Signed, Richardson Dopp, PA-C  11/07/2018 4:47 PM    [facility]

## 2018-11-07 ENCOUNTER — Telehealth (INDEPENDENT_AMBULATORY_CARE_PROVIDER_SITE_OTHER): Payer: Medicare Other | Admitting: Physician Assistant

## 2018-11-07 ENCOUNTER — Other Ambulatory Visit: Payer: Self-pay

## 2018-11-07 ENCOUNTER — Telehealth: Payer: Self-pay | Admitting: Licensed Clinical Social Worker

## 2018-11-07 ENCOUNTER — Encounter: Payer: Self-pay | Admitting: Physician Assistant

## 2018-11-07 ENCOUNTER — Other Ambulatory Visit: Payer: Self-pay | Admitting: *Deleted

## 2018-11-07 VITALS — Ht 71.0 in | Wt 278.0 lb

## 2018-11-07 DIAGNOSIS — I10 Essential (primary) hypertension: Secondary | ICD-10-CM

## 2018-11-07 DIAGNOSIS — Z7189 Other specified counseling: Secondary | ICD-10-CM

## 2018-11-07 DIAGNOSIS — I2510 Atherosclerotic heart disease of native coronary artery without angina pectoris: Secondary | ICD-10-CM

## 2018-11-07 DIAGNOSIS — E785 Hyperlipidemia, unspecified: Secondary | ICD-10-CM

## 2018-11-07 DIAGNOSIS — I499 Cardiac arrhythmia, unspecified: Secondary | ICD-10-CM | POA: Diagnosis not present

## 2018-11-07 DIAGNOSIS — I77810 Thoracic aortic ectasia: Secondary | ICD-10-CM

## 2018-11-07 NOTE — Telephone Encounter (Signed)
CSW referred to assist patient with obtaining a BP cuff. CSW contacted patient to inform cuff will be delivered to home next week. Patient grateful for support and assistance. CSW available as needed. Jackie Loyda Costin, LCSW, CCSW-MCS [phone]  

## 2018-11-07 NOTE — Patient Instructions (Signed)
Medication Instructions:  Continue current medications  If you need a refill on your cardiac medications before your next appointment, please call your pharmacy.   Lab work: None  If you have labs (blood work) drawn today and your tests are completely normal, you will receive your results only by: [name] MyChart Message (if you have MyChart) OR . A paper copy in the mail If you have any lab test that is abnormal or we need to change your treatment, we will call you to review the results.  Testing/Procedures: We will arrange for a 30-day event monitor to assess your heart rhythm  Follow-Up: At [facility], you and your health needs are our priority.  As part of our continuing mission to provide you with exceptional heart care, we have created designated Provider Care Teams.  These Care Teams include your primary Cardiologist (physician) and Advanced Practice Providers (APPs -  Physician Assistants and Nurse Practitioners) who all work together to provide you with the care you need, when you need it. . Dr. Harrington Challenger or Richardson Dopp, PA-C in 6 to 8 weeks  Any Other Special Instructions Will Be Listed Below (If Applicable). If you can, send [name] a copy of your EKG that was obtained by EMS.

## 2018-11-08 ENCOUNTER — Telehealth: Payer: Self-pay | Admitting: *Deleted

## 2018-11-08 NOTE — Telephone Encounter (Signed)
[employer] to ship a cardiac event monitor to the patients home.  Instructions reviewed briefly as they are included in the monitor kit.

## 2018-11-15 ENCOUNTER — Other Ambulatory Visit (INDEPENDENT_AMBULATORY_CARE_PROVIDER_SITE_OTHER): Payer: Medicare Other

## 2018-11-15 ENCOUNTER — Other Ambulatory Visit: Payer: Self-pay | Admitting: Internal Medicine

## 2018-11-15 DIAGNOSIS — R911 Solitary pulmonary nodule: Secondary | ICD-10-CM

## 2018-11-15 DIAGNOSIS — I77810 Thoracic aortic ectasia: Secondary | ICD-10-CM

## 2018-11-15 DIAGNOSIS — R918 Other nonspecific abnormal finding of lung field: Secondary | ICD-10-CM

## 2018-11-15 DIAGNOSIS — I10 Essential (primary) hypertension: Secondary | ICD-10-CM | POA: Diagnosis not present

## 2018-11-15 DIAGNOSIS — E871 Hypo-osmolality and hyponatremia: Secondary | ICD-10-CM

## 2018-11-15 LAB — BASIC METABOLIC PANEL
BUN: 13 mg/dL (ref 6–23)
CO2: 25 mEq/L (ref 19–32)
Calcium: 9 mg/dL (ref 8.4–10.5)
Chloride: 97 mEq/L (ref 96–112)
Creatinine, Ser: 0.84 mg/dL (ref 0.40–1.50)
GFR: 89.86 mL/min (ref >60.00)
Glucose, Bld: 101 mg/dL — ABNORMAL HIGH (ref 70–99)
Potassium: 4.5 mEq/L (ref 3.5–5.1)
Sodium: 132 mEq/L — ABNORMAL LOW (ref 135–145)

## 2018-11-15 LAB — CORTISOL: Cortisol, Plasma: 9.3 ug/dL

## 2018-11-17 ENCOUNTER — Ambulatory Visit (INDEPENDENT_AMBULATORY_CARE_PROVIDER_SITE_OTHER): Payer: Medicare Other

## 2018-11-17 DIAGNOSIS — I499 Cardiac arrhythmia, unspecified: Secondary | ICD-10-CM | POA: Diagnosis not present

## 2018-11-17 DIAGNOSIS — R42 Dizziness and giddiness: Secondary | ICD-10-CM | POA: Diagnosis not present

## 2018-11-17 DIAGNOSIS — I452 Bifascicular block: Secondary | ICD-10-CM | POA: Diagnosis not present

## 2018-11-19 ENCOUNTER — Other Ambulatory Visit: Payer: Self-pay | Admitting: Internal Medicine

## 2018-11-19 DIAGNOSIS — E039 Hypothyroidism, unspecified: Secondary | ICD-10-CM

## 2018-11-24 ENCOUNTER — Telehealth: Payer: Self-pay | Admitting: *Deleted

## 2018-11-24 NOTE — Telephone Encounter (Signed)

## 2018-11-27 ENCOUNTER — Other Ambulatory Visit: Payer: Self-pay

## 2018-11-27 ENCOUNTER — Encounter: Payer: Self-pay | Admitting: Internal Medicine

## 2018-11-27 ENCOUNTER — Ambulatory Visit (INDEPENDENT_AMBULATORY_CARE_PROVIDER_SITE_OTHER)
Admission: RE | Admit: 2018-11-27 | Discharge: 2018-11-27 | Disposition: A | Discharge status: Home / Self Care | Payer: Medicare Other | Source: Ambulatory Visit | Attending: Internal Medicine | Admitting: Internal Medicine

## 2018-11-27 DIAGNOSIS — I77810 Thoracic aortic ectasia: Secondary | ICD-10-CM

## 2018-11-27 DIAGNOSIS — R918 Other nonspecific abnormal finding of lung field: Secondary | ICD-10-CM | POA: Diagnosis not present

## 2018-11-27 DIAGNOSIS — R911 Solitary pulmonary nodule: Secondary | ICD-10-CM

## 2018-11-27 MED ORDER — IOHEXOL 350 MG/ML SOLN
100.0000 mL | Freq: Once | INTRAVENOUS | Status: AC | PRN
  Administered 2018-11-27: 100 mL via INTRAVENOUS

## 2018-12-08 ENCOUNTER — Telehealth: Payer: Self-pay | Admitting: Cardiology

## 2018-12-08 NOTE — Telephone Encounter (Addendum)
Reviewed strips.  Looks like probable AFib. He has a hx of frequent falls. I think he would be high risk for anticoagulation.   Start Toprol XL 25 mg once daily. Shana Please call in the Toprol Please call the patient and request some BP readings - I asked for him to get a BP cuff.  Dr. Harrington Challenger What do you think about anticoagulation for him?  Richardson Dopp, PA-C    12/08/2018 2:27 PM

## 2018-12-08 NOTE — Telephone Encounter (Signed)
Monitoring service called - pt with a fib.  I was able to talk with his wife and she woke the pt - no pain and no complaints - I will send note to Dr. Harrington Challenger and Richardson Dopp for further plan.

## 2018-12-08 NOTE — Telephone Encounter (Signed)
Patient has frequent falls and is probably not a good candidate for anticoagulation.  Shana, Can you get the rhythm strip that shows AFib faxed for me to look at? Richardson Dopp, PA-C    12/08/2018 8:35 AM

## 2018-12-11 ENCOUNTER — Telehealth: Payer: Self-pay | Admitting: Internal Medicine

## 2018-12-11 DIAGNOSIS — K2210 Ulcer of esophagus without bleeding: Secondary | ICD-10-CM

## 2018-12-11 DIAGNOSIS — E039 Hypothyroidism, unspecified: Secondary | ICD-10-CM

## 2018-12-11 DIAGNOSIS — R1314 Dysphagia, pharyngoesophageal phase: Secondary | ICD-10-CM

## 2018-12-11 MED ORDER — LEVOTHYROXINE SODIUM 150 MCG PO TABS: 150.0000 ug | ORAL_TABLET | Freq: Every day | ORAL | 0 refills | Status: DC

## 2018-12-11 MED ORDER — LOSARTAN POTASSIUM 50 MG PO TABS: 50.0000 mg | ORAL_TABLET | Freq: Every day | ORAL | 0 refills | Status: DC

## 2018-12-11 MED ORDER — OMEPRAZOLE 40 MG PO CPDR: 40.0000 mg | DELAYED_RELEASE_CAPSULE | Freq: Every day | ORAL | 0 refills | Status: DC

## 2018-12-11 MED ORDER — DULOXETINE HCL 30 MG PO CPEP: 30.0000 mg | ORAL_CAPSULE | Freq: Every day | ORAL | 0 refills | Status: DC

## 2018-12-11 NOTE — Telephone Encounter (Unsigned)
Copied from [place] [phone]. Topic: Quick Communication - Rx Refill/Question >> Dec 11, 2018 11:05 AM Alanda Slim E wrote: Medication: DULoxetine (CYMBALTA) 30 MG capsule levothyroxine (SYNTHROID) 150 MCG tablet  losartan (COZAAR) 50 MG tablet omeprazole (PRILOSEC) 40 MG capsule  - Pt needs the above meds in a 0 day supply [facility] to [facility]  Has the patient contacted their pharmacy? Yes- have DR. Jones send in new Rx   Preferred Pharmacy (with phone number or street name): [place], [place] [phone] (Phone) [phone] (Fax)    Agent: Please be advised that RX refills may take up to 3 business days. We ask that you follow-up with your pharmacy.

## 2018-12-11 NOTE — Telephone Encounter (Signed)
Agree.  PT see by Kathleen Argue recently. Frequent falls   Too risky for anticoagulation unless there is significant improvement in this Keep on 325 ec ASA

## 2018-12-11 NOTE — Telephone Encounter (Signed)
rx has been printed for pcp review.

## 2018-12-12 MED ORDER — METOPROLOL SUCCINATE ER 25 MG PO TB24: 25.0000 mg | ORAL_TABLET | Freq: Every day | ORAL | 11 refills | Status: DC

## 2018-12-12 NOTE — Telephone Encounter (Signed)
Spoke with patient is aware of medication recommendations and for the next week patient will take his blood pressure twice a day and cal back with log to see how it is for now.

## 2018-12-12 NOTE — Telephone Encounter (Signed)
Error on last note not meant for encounter

## 2018-12-13 NOTE — Telephone Encounter (Signed)
OK 

## 2018-12-13 NOTE — Telephone Encounter (Signed)
Will route back to Dr. Harrington Challenger as Juluis Rainier, clarification. Pt aspirin is listed a 81 mg twice a day, not 325 mg.

## 2018-12-14 ENCOUNTER — Ambulatory Visit: Payer: Self-pay | Admitting: *Deleted

## 2018-12-14 NOTE — Telephone Encounter (Signed)
appt scheduled

## 2018-12-14 NOTE — Telephone Encounter (Signed)
Okay to schedule with PCP or any provider.

## 2018-12-14 NOTE — Telephone Encounter (Signed)
Pt's wife [name] [name] called stating that her husband had weird symptoms; he had a heart monitor device on last Friday 6/12 it appeared the pt was in afib; they have been taking his BP bid, and he was placed on a new medication; she says that the pt was having a headache, and his right hand was tremlbling more than usual; the pt can not lift anything in his right hand due to weakness for the past 10-14 days; they would like to make an appointment to be seen; his BP is 123/68 today; spoke with Gareth Eagle at [facility]; she will contact the pt for scheduling once she speaks with Dr [name]; they can be contacted at [phone], and a message can be left on the voicemail; will route to office for final disposition.  Reason for Disposition . Requesting regular office appointment  Answer Assessment - Initial Assessment Questions 1. REASON FOR CALL or QUESTION: "What is your reason for calling today?" or "How can I best help you?" or "What question do you have that I can help answer?"     Which Dr should I see for ongoing problems  Protocols used: INFORMATION ONLY CALL-A-AH

## 2018-12-15 ENCOUNTER — Other Ambulatory Visit: Payer: Self-pay | Admitting: Internal Medicine

## 2018-12-15 ENCOUNTER — Ambulatory Visit (INDEPENDENT_AMBULATORY_CARE_PROVIDER_SITE_OTHER): Payer: Medicare Other | Admitting: Family

## 2018-12-15 ENCOUNTER — Other Ambulatory Visit: Payer: Self-pay

## 2018-12-15 ENCOUNTER — Encounter: Payer: Self-pay | Admitting: Family

## 2018-12-15 VITALS — BP 126/74 | HR 83 | Temp 98.7°F | Ht 71.0 in

## 2018-12-15 DIAGNOSIS — R29818 Other symptoms and signs involving the nervous system: Secondary | ICD-10-CM

## 2018-12-15 DIAGNOSIS — M79601 Pain in right arm: Secondary | ICD-10-CM | POA: Diagnosis not present

## 2018-12-15 DIAGNOSIS — R296 Repeated falls: Secondary | ICD-10-CM | POA: Diagnosis not present

## 2018-12-15 NOTE — Progress Notes (Signed)
[name] [name] is a 73 y.o. male with the following history as recorded in EpicCare:  Patient Active Problem List   Diagnosis Date Noted  . Chronic hyponatremia 10/17/2018  . Benign prostatic hyperplasia without lower urinary tract symptoms 09/18/2018  . Prediabetes 09/18/2018  . Claudication of both lower extremities ([place]) 09/18/2018  . Hyperlipidemia LDL goal <70 09/18/2018  . Pure hyperglyceridemia 09/18/2018  . Routine general medical examination at a health care facility 09/18/2018  . GERD (gastroesophageal reflux disease)   . CAD (coronary artery disease)   . OSA (obstructive sleep apnea) 09/18/2015  . Mild concentric left ventricular hypertrophy (LVH) 09/18/2015  . 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[place]) 09/18/2015  . Hypothyroidism 09/18/2015  . Chronic idiopathic constipation 08/18/2015  . Primary localized osteoarthritis of right knee 08/08/2015  . Pulmonary nodule 06/13/2012  . CAD, NATIVE VESSEL 06/18/2009  . Essential hypertension 07/14/2007    Current Outpatient Medications  Medication Sig Dispense Refill  . aspirin EC 81 MG tablet Take 1 tablet (81 mg total) by mouth 2 (two) times daily.    . DULoxetine (CYMBALTA) 30 MG capsule Take 1 capsule (30 mg total) by mouth daily. 90 capsule 0  . gabapentin (NEURONTIN) 300 MG capsule TAKE 1 CAPSULE BY MOUTH TWICE PER DAY    . Icosapent Ethyl (VASCEPA) 1 g CAPS Take 2 capsules (2 g total) by mouth 2 (two) times daily. 360 capsule 1  . levothyroxine (SYNTHROID) 150 MCG tablet Take 1 tablet (150 mcg total) by mouth daily before breakfast. 90 tablet 0  . losartan (COZAAR) 50 MG tablet Take 1 tablet (50 mg total) by mouth daily. 90 tablet 0  . metoprolol succinate (TOPROL XL) 25 MG 24 hr tablet Take 1 tablet (25 mg total) by mouth daily. 30 tablet 11  . omeprazole (PRILOSEC) 40 MG capsule Take 1 capsule (40 mg total) by mouth daily before breakfast. 30 minutes before 90 capsule 0  . polyethylene glycol (MIRALAX) 0.34 gm/ml SOLN Take 0.5  g/kg by mouth as needed.     . simvastatin (ZOCOR) 20 MG tablet Take 1 tablet (20 mg total) by mouth daily at 6 PM. Please keep upcoming appt in December for future refills. Thank you 90 tablet 1  . Trospium Chloride 60 MG CP24 Take 1 capsule by mouth daily.     No current facility-administered medications for this visit.     Allergies: Penicillins, Codeine, and Sulfonamide derivatives  Past Medical History:  Diagnosis Date  . Arthritis   . CAD (coronary artery disease)    cath 3/10: mLAD 30%, pRCA 30%, EF 55-60% Dr. Harrington Challenger cardiologist  most recent  stress stress done ~ 2 years ago with Dr. Harrington Challenger  . Complication of anesthesia    "he usually gets an ileus after back OR"   . Depression   . Dyspnea    -PFTs compeltely normal 03/20/08 including DLc0  . Environmental allergies    Dust, Smoke  . GERD (gastroesophageal reflux disease)   . HTN (hypertension)   . Hypothyroidism   . Low testosterone   . Observed sleep apnea    can't wear cpap  . Polycythemia   . PONV (postoperative nausea and vomiting)   . Pulmonary nodule 06/13/2012  . Rosacea   . Sleep apnea    Most recent sleep study 2010; records at [facility]    Past Surgical History:  Procedure Laterality Date  . BACK SURGERY  2005; 06/2006; 01/2007; 04/2009  . CARDIAC CATHETERIZATION  08/2008  .  CATARACT EXTRACTION, BILATERAL  2010  . CERVICAL FUSION  04/2002   ?C3-4  . COLONOSCOPY  12/2005   normal screening study.   . ESOPHAGOGASTRODUODENOSCOPY  multiple   last 07/2012 GERD esophagitis, 48 Fr dilation  . ESOPHAGOGASTRODUODENOSCOPY N/A 09/19/2015   Procedure: ESOPHAGOGASTRODUODENOSCOPY (EGD);  Surgeon: [name], MD;  Location: [facility] ENDOSCOPY;  Service: Endoscopy;  Laterality: N/A;  . KNEE ARTHROSCOPY WITH MEDIAL MENISECTOMY Right 05/14/2015   Procedure: RIGHT KNEE ARTHROSCOPY CHONDROPLASTY, PARTIAL  MEDIAL MENISECTOMY;  Surgeon: [name], MD;  Location: [place];  Service: Orthopedics;   Laterality: Right;  . LUMBAR DISC SURGERY  06/2006   L3  . MENISCUS REPAIR  08/2008   left  . POSTERIOR FUSION LUMBAR SPINE  2005; 01/2007; 04/2009   L5; L3-4; L2-3  . PROSTATE SURGERY  2000  . SAVORY DILATION N/A 09/19/2015   Procedure: SAVORY DILATION;  Surgeon: [name], MD;  Location: [facility] ENDOSCOPY;  Service: Endoscopy;  Laterality: N/A;  . SHOULDER OPEN ROTATOR CUFF REPAIR Left 1999   left  . TONSILLECTOMY  09/21/11  . TOTAL KNEE ARTHROPLASTY Left 01/2011   left  . TOTAL KNEE ARTHROPLASTY Right 08/08/2015   Procedure: RIGHT TOTAL KNEE ARTHROPLASTY;  Surgeon: [name], MD;  Location: [place];  Service: Orthopedics;  Laterality: Right;  . TRANSURETHRAL RESECTION OF PROSTATE  2006   followed by "surgery to get rid of clots"  . UVULOPALATOPHARYNGOPLASTY, TONSILLECTOMY AND SEPTOPLASTY  09/21/11   Deviated Septum    Family History  Problem Relation Age of Onset  . Breast cancer Mother        dx. mid-70s  . Colon polyps Mother        "42+ colon polyps per yr"  . Diabetes Mother   . Breast cancer Sister 91  . Other Sister        + MUTYH mutation  . Diabetes Sister        borderline  . Heart Problems Sister   . Breast cancer Sister        dx. 25s  . Heart attack Father   . Alzheimer's disease Father   . Lung cancer Paternal Uncle        x4  . Throat cancer Maternal Uncle        mother's maternal half-brother; dx 36s; +EtOH  . Liver cancer Maternal Uncle        mother's maternal half-brother; either liver cancer or cirrhosis  . Cirrhosis Maternal Uncle        +EtOH  . Stroke Paternal Grandmother   . Heart attack Paternal Grandfather   . Other Daughter        negative genetic testing in 2017  . Parkinson's disease Other   . Bipolar disorder Other   . Lung cancer Paternal Uncle        (x3)  . Lung cancer Paternal Uncle   . Alzheimer's disease Cousin        paternal 1st cousin, d. 28  . Heart attack Cousin   . Anesthesia problems Neg Hx     Social History    Tobacco Use  . Smoking status: Former Smoker    Quit date: 07/17/1974    Years since quitting: 44.4  . Smokeless tobacco: Former Network engineer Use Topics  . Alcohol use: No    Subjective:  Patient is accompanied by his daughter; chronic problems with balance/ frequent falls- symptoms have been present for years; notes that in the past few weeks  has been having increased problems with right arm weakness; daughter is concerned that is having increased symptoms c/w TIAs; recently diagnosed with A. Fib- not a candidate for anti-coagulation per cardiology due to poor balance/ frequent falls.  In reviewing notes, last saw his neurologist in 2014;     Objective:  Vitals:   12/15/18 1310  BP: 126/74  Pulse: 83  Temp: 98.7 F (37.1 C)  TempSrc: Oral  SpO2: 97%  Height: 5\' 11"  (1.803 m)    General: Well developed, well nourished, in no acute distress  Skin : Warm and dry.  Head: Normocephalic and atraumatic  Eyes: Sclera and conjunctiva clear; pupils round and reactive to light; extraocular movements intact  Lungs: Respirations unlabored;  Musculoskeletal: No deformities; no active joint inflammation  Extremities: No edema, cyanosis, clubbing  Vessels: Symmetric bilaterally  Neurologic: Alert and oriented; speech intact; face symmetrical; in wheelchair;  Assessment:  1. Recurrent falls   2. Other symptoms and signs involving the nervous system   3. Right arm pain     Plan:  1. & 2. Update MRI of brain; refer back to his neurologist; follow-up with his PCP as needed otherwise. 3. ? Muscular source as opposed to neurologist; symptoms present more like a rotator cuff injury; patient will call his orthopedist and make his own follow-up there.   No follow-ups on file.  Orders Placed This Encounter  Procedures  . MR Brain W Wo Contrast    Standing Status:   Future    Standing Expiration Date:   02/14/2020    Order Specific Question:   If indicated for the ordered procedure, I  authorize the administration of contrast media per Radiology protocol    Answer:   Yes    Order Specific Question:   What is the patient's sedation requirement?    Answer:   No Sedation    Order Specific Question:   Does the patient have a pacemaker or implanted devices?    Answer:   No    Order Specific Question:   Radiology Contrast Protocol - do NOT remove file path    Answer:   [email or web address]    Order Specific Question:   Preferred imaging location?    Answer:   [facility] (table limit-550lbs)  . Ambulatory referral to Neurology    Referral Priority:   Routine    Referral Type:   Consultation    Referral Reason:   Specialty Services Required    Referred to Provider:   Garvin Fila, MD    Requested Specialty:   Neurology    Number of Visits Requested:   1    Requested Prescriptions    No prescriptions requested or ordered in this encounter

## 2018-12-27 ENCOUNTER — Other Ambulatory Visit: Payer: Self-pay

## 2018-12-27 ENCOUNTER — Other Ambulatory Visit: Payer: Self-pay | Admitting: Physician Assistant

## 2018-12-27 DIAGNOSIS — I499 Cardiac arrhythmia, unspecified: Secondary | ICD-10-CM

## 2018-12-27 DIAGNOSIS — R42 Dizziness and giddiness: Secondary | ICD-10-CM

## 2018-12-27 DIAGNOSIS — I452 Bifascicular block: Secondary | ICD-10-CM

## 2018-12-28 ENCOUNTER — Encounter: Payer: Self-pay | Admitting: Neurology

## 2018-12-28 ENCOUNTER — Ambulatory Visit: Payer: Medicare Other | Admitting: Neurology

## 2018-12-28 ENCOUNTER — Telehealth: Payer: Self-pay | Admitting: Neurology

## 2018-12-28 ENCOUNTER — Other Ambulatory Visit: Payer: Self-pay

## 2018-12-28 VITALS — BP 141/83 | HR 76 | Temp 97.7°F | Ht 70.0 in | Wt 281.0 lb

## 2018-12-28 DIAGNOSIS — R29898 Other symptoms and signs involving the musculoskeletal system: Secondary | ICD-10-CM | POA: Diagnosis not present

## 2018-12-28 DIAGNOSIS — M5010 Cervical disc disorder with radiculopathy, unspecified cervical region: Secondary | ICD-10-CM | POA: Diagnosis not present

## 2018-12-28 NOTE — Patient Instructions (Signed)
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  I recommend adding MRI scan cervical spine without contrast to his previously scheduled MRI scan of the brain on July 16.  He may need referral back to his neurosurgeon if radiculopathy is confirmed if it is not he may need to see his orthopedic surgeon for evaluation for rotator cuff.  We also discussed fall and safety precautions and he was advised to use his walker at all times.  Continue aspirin for stroke prevention.  He will return for follow-up in the future in 6 months or call earlier if necessary  Fall Prevention in the Home, Adult Falls can cause injuries and can affect people from all age groups. There are many simple things that you can do to make your home safe and to help prevent falls. Ask for help when making these changes, if needed. What actions can I take to prevent falls? General instructions  Use good lighting in all rooms. Replace any light bulbs that burn out.  Turn on lights if it is dark. Use night-lights.  Place frequently used items in easy-to-reach places. Lower the shelves around your home if necessary.  Set up furniture so that there are clear paths around it. Avoid moving your furniture around.  Remove throw rugs and other tripping hazards from the floor.  Avoid walking on wet floors.  Fix any uneven floor surfaces.  Add color or contrast paint or tape to grab bars and handrails in your home. Place contrasting color strips on the first and last steps of stairways.  When you use a stepladder, make sure that it is completely opened and that the sides are firmly locked. Have someone hold the ladder while you are using it. Do not climb a closed stepladder.  Be aware of any and all pets. What can I do in the bathroom?      Keep the floor dry. Immediately clean up any water that  spills onto the floor.  Remove soap buildup in the tub or shower on a regular basis.  Use non-skid mats or decals on the floor of the tub or shower.  Attach bath mats securely with double-sided, non-slip rug tape.  If you need to sit down while you are in the shower, use a plastic, non-slip stool.  Install grab bars by the toilet and in the tub and shower. Do not use towel bars as grab bars. What can I do in the bedroom?  Make sure that a bedside light is easy to reach.  Do not use oversized bedding that drapes onto the floor.  Have a firm chair that has side arms to use for getting dressed. What can I do in the kitchen?  Clean up any spills right away.  If you need to reach for something above you, use a sturdy step stool that has a grab bar.  Keep electrical cables out of the way.  Do not use floor polish or wax that makes floors slippery. If you must use wax, make sure that it is non-skid floor wax. What can I do in the stairways?  Do not leave any items on the stairs.  Make sure that you have a light switch at the top of the stairs and the bottom of the stairs. Have them installed if you do not have them.  Make sure that there are handrails on both sides of  the stairs. Fix handrails that are broken or loose. Make sure that handrails are as long as the stairways.  Install non-slip stair treads on all stairs in your home.  Avoid having throw rugs at the top or bottom of stairways, or secure the rugs with carpet tape to prevent them from moving.  Choose a carpet design that does not hide the edge of steps on the stairway.  Check any carpeting to make sure that it is firmly attached to the stairs. Fix any carpet that is loose or worn. What can I do on the outside of my home?  Use bright outdoor lighting.  Regularly repair the edges of walkways and driveways and fix any cracks.  Remove high doorway thresholds.  Trim any shrubbery on the main path into your  home.  Regularly check that handrails are securely fastened and in good repair. Both sides of any steps should have handrails.  Install guardrails along the edges of any raised decks or porches.  Clear walkways of debris and clutter, including tools and rocks.  Have leaves, snow, and ice cleared regularly.  Use sand or salt on walkways during winter months.  In the garage, clean up any spills right away, including grease or oil spills. What other actions can I take?  Wear closed-toe shoes that fit well and support your feet. Wear shoes that have rubber soles or low heels.  Use mobility aids as needed, such as canes, walkers, scooters, and crutches.  Review your medicines with your health care provider. Some medicines can cause dizziness or changes in blood pressure, which increase your risk of falling. Talk with your health care provider about other ways that you can decrease your risk of falls. This may include working with a physical therapist or trainer to improve your strength, balance, and endurance. Where to find more information  Centers for Disease Control and Prevention, [employer]: WebmailGuide.co.za  [employer] on Aging: BrainJudge.co.uk Contact a health care provider if:  You are afraid of falling at home.  You feel weak, drowsy, or dizzy at home.  You fall at home. Summary  There are many simple things that you can do to make your home safe and to help prevent falls.  Ways to make your home safe include removing tripping hazards and installing grab bars in the bathroom.  Ask for help when making these changes in your home. This information is not intended to replace advice given to you by your health care provider. Make sure you discuss any questions you have with your health care provider. Document Released: 06/04/2002 Document Revised: 05/27/2017 Document Reviewed: 01/27/2017 Elsevier Patient Education  2020 [employer].

## 2018-12-28 NOTE — Progress Notes (Signed)
[facility] [address] [place]. [place] [address] [phone]       OFFICE CONSULT NOTE  [name] Date of Birth:  25-May-1946 Medical Record Number:  [id number]   Referring MD:  Marrian Salvage, FNP Reason for Referral:   Right hand weakness HPI:.  [name] is a 73 year old Caucasian male who is referred today for evaluation for right hand weakness and increasing falls and tremors for the last 6 months or so.  He is accompanied by his wife who complements his history.  I have also reviewed electronic medical records and personally reviewed imaging films in PACS.  About 6 months ago he started developing pain in the right upper extremity as well as tingling and numbness.  The pain is much improved but he still has some intermittent tingling.  He is noticed having weakness in his right hand and difficulty in lifting objects.  He often in fact drops objects from his hand and that has become quite frustrating for him.  His has longstanding great and balance difficulties which are multifactorial due to degenerative spine disease and neuropathy.  He has had a few falls recently and feels his gait and balance are also getting worse.  He did have EMG nerve conduction study ordered by me on 02/27/2016 which showed bilateral chronic   L5-S1 radiculopathies.  He has a history of degenerative spine disease and has undergone 4 back surgeries as well as bilateral knee replacement.  He sees Dr. Ellene Route who is told him that he cannot have any further surgeries on his back.  He also had remote history of C-spine spine fusion surgery done. He denies any recent neck injury fall or radicular pain. ROS:   14 system review of systems is positive for pain, knee pain, back pain.,  Right arm weakness, gait and balance difficulties all other systems negative PMH:  Past Medical History:  Diagnosis Date  . Arthritis   . CAD (coronary artery disease)    cath 3/10: mLAD 30%, pRCA 30%, EF  55-60% Dr. Harrington Challenger cardiologist  most recent  stress stress done ~ 2 years ago with Dr. Harrington Challenger  . Complication of anesthesia    "he usually gets an ileus after back OR"   . Depression   . Dyspnea    -PFTs compeltely normal 03/20/08 including DLc0  . Environmental allergies    Dust, Smoke  . GERD (gastroesophageal reflux disease)   . HTN (hypertension)   . Hypothyroidism   . Low testosterone   . Observed sleep apnea    can't wear cpap  . Polycythemia   . PONV (postoperative nausea and vomiting)   . Pulmonary nodule 06/13/2012  . Rosacea   . Sleep apnea    Most recent sleep study 2010; records at [facility]    Social History:  Social History   Socioeconomic History  . Marital status: Married    Spouse name: Not on file  . Number of children: 3  . Years of education: Not on file  . Highest education level: Not on file  Occupational History  . Occupation: retired    Fish farm manager: [employer]  . Financial resource strain: Not on file  . Food insecurity    Worry: Not on file    Inability: Not on file  . Transportation needs    Medical: Not on file    Non-medical: Not on file  Tobacco Use  . Smoking status: Former Smoker  Quit date: 07/17/1974    Years since quitting: 44.4  . Smokeless tobacco: Former Network engineer and Sexual Activity  . Alcohol use: No  . Drug use: No  . Sexual activity: Not on file  Lifestyle  . Physical activity    Days per week: Not on file    Minutes per session: Not on file  . Stress: Not on file  Relationships  . Social Herbalist on phone: Not on file    Gets together: Not on file    Attends religious service: Not on file    Active member of club or organization: Not on file    Attends meetings of clubs or organizations: Not on file    Relationship status: Not on file  . Intimate partner violence    Fear of current or ex partner: Not on file    Emotionally abused: Not on file    Physically abused: Not  on file    Forced sexual activity: Not on file  Other Topics Concern  . Not on file  Social History Narrative   Area, 3 children.   Retired from the city of [place]   5 caffeinated beverages daily    Medications:   Current Outpatient Medications on File Prior to Visit  Medication Sig Dispense Refill  . aspirin EC 81 MG tablet Take 1 tablet (81 mg total) by mouth 2 (two) times daily.    . DULoxetine (CYMBALTA) 30 MG capsule Take 1 capsule (30 mg total) by mouth daily. 90 capsule 0  . gabapentin (NEURONTIN) 300 MG capsule TAKE 1 CAPSULE BY MOUTH TWICE PER DAY    . levothyroxine (SYNTHROID) 150 MCG tablet Take 1 tablet (150 mcg total) by mouth daily before breakfast. 90 tablet 0  . losartan (COZAAR) 50 MG tablet Take 1 tablet (50 mg total) by mouth daily. 90 tablet 0  . metoprolol succinate (TOPROL XL) 25 MG 24 hr tablet Take 1 tablet (25 mg total) by mouth daily. 30 tablet 11  . omeprazole (PRILOSEC) 40 MG capsule Take 1 capsule (40 mg total) by mouth daily before breakfast. 30 minutes before 90 capsule 0  . polyethylene glycol (MIRALAX) 17 g packet Take 17 g by mouth daily.    . simvastatin (ZOCOR) 20 MG tablet Take 1 tablet (20 mg total) by mouth daily at 6 PM. Please keep upcoming appt in December for future refills. Thank you 90 tablet 1  . Trospium Chloride 60 MG CP24 Take 1 capsule by mouth daily.    [name] UNABLE TO FIND Mega red 900mg  one per day     No current facility-administered medications on file prior to visit.     Allergies:   Allergies  Allergen Reactions  . Penicillins Hives and Other (See Comments)    "I break out in welts" Has patient had a PCN reaction causing immediate rash, facial/tongue/throat swelling, SOB or lightheadedness with hypotension: No Has patient had a PCN reaction causing severe rash involving mucus membranes or skin necrosis: No Has patient had a PCN reaction that required hospitalization No Has patient had a PCN reaction occurring within the last  10 years: No If all of the above answers are "NO", then may proceed with Cephalosporin use.  . Codeine Nausea And Vomiting  . Sulfonamide Derivatives Nausea And Vomiting    Physical Exam General: Obese elderly Caucasian male, seated, in no evident distress Head: head normocephalic and atraumatic.   Neck: supple with no carotid or supraclavicular bruits Cardiovascular:  regular rate and rhythm, no murmurs Musculoskeletal: no deformity.  Left shoulder pain limits abduction Skin:  no rash/petichiae Vascular:  Normal pulses all extremities  Neurologic Exam Mental Status: Awake and fully alert. Oriented to place and time. Recent and remote memory intact. Attention span, concentration and fund of knowledge appropriate. Mood and affect appropriate.  Cranial Nerves: Fundoscopic exam reveals sharp disc margins. Pupils equal, briskly reactive to light. Extraocular movements full without nystagmus. Visual fields full to confrontation. Hearing intact. Facial sensation intact. Face, tongue, palate moves normally and symmetrically.  Motor: Significant weakness of right supraspinatus, biceps and brachioradialis with relatively good strength in the triceps and wrist and hand muscles.  No other focal weakness except mild ankle dorsiflexor weakness..  Left shoulder elevation limited due to pain.  No fasciculations or wasting noted. Sensory.: intact to touch , pinprick , position and vibratory sensation.  Positive Romberg sign..  Coordination: Rapid alternating movements normal in all extremities. Finger-to-nose and heel-to-shin performed accurately bilaterally. Gait and Station: Arises from chair without difficulty. Stance is normal. Gait demonstrates normal stride length and balance .  Slightly unsteady while standing on either foot unsupported and on his heels.  Unable to walk tandem. Reflexes: 1+ and symmetric except both ankle jerks are depressed.. Toes downgoing.       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  He also has longstanding chronic gait and balance difficulties which are multifactorial due to combination of degenerative lumbar spine disease, chronic knee pain .     PLAN: I had a long discussion with the patient and his wife regarding his right upper extremity weakness which likely represents cervical C5-6 radiculopathy from degenerative spine disease.  I recommend adding MRI scan cervical spine without contrast to his previously scheduled MRI scan of the brain on July 16.  He may need referral back to his neurosurgeon if radiculopathy is confirmed if it is not he may need to see his orthopedic surgeon for evaluation for rotator cuff.  We also discussed fall and safety precautions and he was advised to use his walker at all times.  Continue aspirin for stroke prevention.  Greater than 50% time during this 45-minute consultation visit was spent on counseling and coordination of care about his right arm weakness and discussion about differential diagnosis and evaluation and treatment plan and answering questions.  He will return for follow-up in the future in 6 months or call earlier if necessary Antony Contras, MD  [facility] [address] [place], [place] [address]  Phone [phone] Fax [phone] Note: This document was prepared with digital dictation and possible smart phrase technology. Any transcriptional errors that result from this process are unintentional.

## 2018-12-28 NOTE — Telephone Encounter (Signed)
[employer] medicare order sent to GI. No auth they will reach out to the patient to schedule.  

## 2019-01-11 ENCOUNTER — Other Ambulatory Visit: Payer: Self-pay

## 2019-01-11 ENCOUNTER — Ambulatory Visit
Admission: RE | Admit: 2019-01-11 | Discharge: 2019-01-11 | Disposition: A | Discharge status: Home / Self Care | Payer: Medicare Other | Source: Ambulatory Visit | Attending: Family | Admitting: Family

## 2019-01-11 DIAGNOSIS — R29818 Other symptoms and signs involving the nervous system: Secondary | ICD-10-CM

## 2019-01-11 MED ORDER — GADOBENATE DIMEGLUMINE 529 MG/ML IV SOLN
20.0000 mL | Freq: Once | INTRAVENOUS | Status: AC | PRN
  Administered 2019-01-11: 20 mL via INTRAVENOUS

## 2019-01-31 ENCOUNTER — Other Ambulatory Visit: Payer: Self-pay | Admitting: Internal Medicine

## 2019-01-31 DIAGNOSIS — E039 Hypothyroidism, unspecified: Secondary | ICD-10-CM

## 2019-01-31 DIAGNOSIS — K2210 Ulcer of esophagus without bleeding: Secondary | ICD-10-CM

## 2019-01-31 DIAGNOSIS — R1314 Dysphagia, pharyngoesophageal phase: Secondary | ICD-10-CM

## 2019-02-02 ENCOUNTER — Ambulatory Visit
Admission: RE | Admit: 2019-02-02 | Discharge: 2019-02-02 | Disposition: A | Discharge status: Home / Self Care | Payer: Medicare Other | Source: Ambulatory Visit | Attending: Neurology | Admitting: Neurology

## 2019-02-02 DIAGNOSIS — M5010 Cervical disc disorder with radiculopathy, unspecified cervical region: Secondary | ICD-10-CM

## 2019-02-06 ENCOUNTER — Other Ambulatory Visit (HOSPITAL_COMMUNITY): Payer: Self-pay | Admitting: Neurology

## 2019-02-06 DIAGNOSIS — M5010 Cervical disc disorder with radiculopathy, unspecified cervical region: Secondary | ICD-10-CM

## 2019-03-08 ENCOUNTER — Ambulatory Visit (INDEPENDENT_AMBULATORY_CARE_PROVIDER_SITE_OTHER): Payer: Medicare Other | Admitting: Internal Medicine

## 2019-03-08 ENCOUNTER — Encounter: Payer: Self-pay | Admitting: Internal Medicine

## 2019-03-08 ENCOUNTER — Other Ambulatory Visit: Payer: Self-pay

## 2019-03-08 ENCOUNTER — Other Ambulatory Visit (INDEPENDENT_AMBULATORY_CARE_PROVIDER_SITE_OTHER): Payer: Medicare Other

## 2019-03-08 VITALS — BP 138/78 | HR 90 | Temp 98.3°F | Resp 16 | Ht 70.0 in | Wt 280.0 lb

## 2019-03-08 DIAGNOSIS — N400 Enlarged prostate without lower urinary tract symptoms: Secondary | ICD-10-CM | POA: Diagnosis not present

## 2019-03-08 DIAGNOSIS — R7303 Prediabetes: Secondary | ICD-10-CM

## 2019-03-08 DIAGNOSIS — I10 Essential (primary) hypertension: Secondary | ICD-10-CM

## 2019-03-08 DIAGNOSIS — E039 Hypothyroidism, unspecified: Secondary | ICD-10-CM

## 2019-03-08 DIAGNOSIS — Z23 Encounter for immunization: Secondary | ICD-10-CM | POA: Diagnosis not present

## 2019-03-08 LAB — BASIC METABOLIC PANEL
BUN: 15 mg/dL (ref 6–23)
CO2: 28 mEq/L (ref 19–32)
Calcium: 9.4 mg/dL (ref 8.4–10.5)
Chloride: 100 mEq/L (ref 96–112)
Creatinine, Ser: 0.86 mg/dL (ref 0.40–1.50)
GFR: 87.38 mL/min (ref >60.00)
Glucose, Bld: 90 mg/dL (ref 70–99)
Potassium: 4.5 mEq/L (ref 3.5–5.1)
Sodium: 135 mEq/L (ref 135–145)

## 2019-03-08 LAB — HEMOGLOBIN A1C: Hgb A1c MFr Bld: 6.3 % (ref 4.6–6.5)

## 2019-03-08 LAB — PSA: PSA: 0.28 ng/mL (ref 0.10–4.00)

## 2019-03-08 LAB — TSH: TSH: 1.4 u[IU]/mL (ref 0.35–4.50)

## 2019-03-08 NOTE — Patient Instructions (Signed)
Hypothyroidism  Hypothyroidism is when the thyroid gland does not make enough of certain hormones (it is underactive). The thyroid gland is a small gland located in the lower front part of the neck, just in front of the windpipe (trachea). This gland makes hormones that help control how the body uses food for energy (metabolism) as well as how the heart and brain function. These hormones also play a role in keeping your bones strong. When the thyroid is underactive, it produces too little of the hormones thyroxine (T4) and triiodothyronine (T3). What are the causes? This condition may be caused by:  Hashimoto's disease. This is a disease in which the body's disease-fighting system (immune system) attacks the thyroid gland. This is the most common cause.  Viral infections.  Pregnancy.  Certain medicines.  Birth defects.  Past radiation treatments to the head or neck for cancer.  Past treatment with radioactive iodine.  Past exposure to radiation in the environment.  Past surgical removal of part or all of the thyroid.  Problems with a gland in the center of the brain (pituitary gland).  Lack of enough iodine in the diet. What increases the risk? You are more likely to develop this condition if:  You are male.  You have a family history of thyroid conditions.  You use a medicine called lithium.  You take medicines that affect the immune system (immunosuppressants). What are the signs or symptoms? Symptoms of this condition include:  Feeling as though you have no energy (lethargy).  Not being able to tolerate cold.  Weight gain that is not explained by a change in diet or exercise habits.  Lack of appetite.  Dry skin.  Coarse hair.  Menstrual irregularity.  Slowing of thought processes.  Constipation.  Sadness or depression. How is this diagnosed? This condition may be diagnosed based on:  Your symptoms, your medical history, and a physical exam.  Blood  tests. You may also have imaging tests, such as an ultrasound or MRI. How is this treated? This condition is treated with medicine that replaces the thyroid hormones that your body does not make. After you begin treatment, it may take several weeks for symptoms to go away. Follow these instructions at home:  Take over-the-counter and prescription medicines only as told by your health care provider.  If you start taking any new medicines, tell your health care provider.  Keep all follow-up visits as told by your health care provider. This is important. ? As your condition improves, your dosage of thyroid hormone medicine may change. ? You will need to have blood tests regularly so that your health care provider can monitor your condition. Contact a health care provider if:  Your symptoms do not get better with treatment.  You are taking thyroid replacement medicine and you: ? Sweat a lot. ? Have tremors. ? Feel anxious. ? Lose weight rapidly. ? Cannot tolerate heat. ? Have emotional swings. ? Have diarrhea. ? Feel weak. Get help right away if you have:  Chest pain.  An irregular heartbeat.  A rapid heartbeat.  Difficulty breathing. Summary  Hypothyroidism is when the thyroid gland does not make enough of certain hormones (it is underactive).  When the thyroid is underactive, it produces too little of the hormones thyroxine (T4) and triiodothyronine (T3).  The most common cause is Hashimoto's disease, a disease in which the body's disease-fighting system (immune system) attacks the thyroid gland. The condition can also be caused by viral infections, medicine, pregnancy, or past   radiation treatment to the head or neck.  Symptoms may include weight gain, dry skin, constipation, feeling as though you do not have energy, and not being able to tolerate cold.  This condition is treated with medicine to replace the thyroid hormones that your body does not make. This information  is not intended to replace advice given to you by your health care provider. Make sure you discuss any questions you have with your health care provider. Document Released: 06/14/2005 Document Revised: 05/27/2017 Document Reviewed: 05/25/2017 Elsevier Patient Education  2020 Elsevier Inc.  

## 2019-03-08 NOTE — Progress Notes (Signed)
Subjective:  Patient ID: [name] [name], male    DOB: [birth year]  Age: 73 y.o. MRN: [id number]  CC: Hypertension and Hypothyroidism   HPI [name] presents for f/up - He tells me he is in his usual state of health with a chronic cough that is occasionally productive of white phlegm.  This is being managed by pulmonary.  He denies chest pain, hemoptysis, DOE, palpitations, edema, or fatigue.  Outpatient Medications Prior to Visit  Medication Sig Dispense Refill   aspirin EC 81 MG tablet Take 1 tablet (81 mg total) by mouth 2 (two) times daily.     DULoxetine (CYMBALTA) 30 MG capsule TAKE 1 CAPSULE BY MOUTH  DAILY 90 capsule 1   gabapentin (NEURONTIN) 300 MG capsule TAKE 1 CAPSULE BY MOUTH TWICE PER DAY     levothyroxine (SYNTHROID) 150 MCG tablet TAKE 1 TABLET BY MOUTH  DAILY BEFORE BREAKFAST 90 tablet 0   losartan (COZAAR) 50 MG tablet TAKE 1 TABLET BY MOUTH  DAILY 90 tablet 1   metoprolol succinate (TOPROL XL) 25 MG 24 hr tablet Take 1 tablet (25 mg total) by mouth daily. 30 tablet 11   omeprazole (PRILOSEC) 40 MG capsule TAKE 1 CAPSULE BY MOUTH  DAILY 30 MINUTES BEFORE  BREAKFAST 90 capsule 1   polyethylene glycol (MIRALAX) 17 g packet Take 17 g by mouth daily.     simvastatin (ZOCOR) 20 MG tablet Take 1 tablet (20 mg total) by mouth daily at 6 PM. Please keep upcoming appt in December for future refills. Thank you 90 tablet 1   Trospium Chloride 60 MG CP24 Take 1 capsule by mouth daily.     UNABLE TO FIND Mega red 900mg  one per day     No facility-administered medications prior to visit.     ROS Review of Systems  Constitutional: Negative for appetite change, diaphoresis, fatigue and unexpected weight change.  HENT: Negative.   Eyes: Negative for visual disturbance.  Respiratory: Positive for cough. Negative for chest tightness, shortness of breath and wheezing.   Cardiovascular: Negative for chest pain, palpitations and leg swelling.  Gastrointestinal: Negative  for abdominal pain, constipation, diarrhea, nausea and vomiting.  Endocrine: Negative for cold intolerance and heat intolerance.  Genitourinary: Negative for difficulty urinating and dysuria.  Musculoskeletal: Negative.  Negative for arthralgias and myalgias.  Skin: Negative.  Negative for color change and pallor.  Neurological: Negative for dizziness, weakness and light-headedness.  Hematological: Negative for adenopathy. Does not bruise/bleed easily.  Psychiatric/Behavioral: Negative.     Objective:  BP 138/78 (BP Location: Left Arm, Patient Position: Sitting, Cuff Size: Large)    Pulse 90    Temp 98.3 F (36.8 C) (Oral)    Resp 16    Ht 5\' 10"  (1.778 m)    Wt 280 lb (127 kg)    SpO2 95%    BMI 40.18 kg/m   BP Readings from Last 3 Encounters:  03/08/19 138/78  12/28/18 (!) 141/83  12/15/18 126/74    Wt Readings from Last 3 Encounters:  03/08/19 280 lb (127 kg)  12/28/18 281 lb (127.5 kg)  11/07/18 278 lb (126.1 kg)    Physical Exam Vitals signs reviewed.  Constitutional:      General: He is not in acute distress.    Appearance: He is obese. He is not ill-appearing, toxic-appearing or diaphoretic.  HENT:     Nose: Nose normal.     Mouth/Throat:     Mouth: Mucous membranes are moist.  Pharynx: No oropharyngeal exudate.  Eyes:     General: No scleral icterus.    Conjunctiva/sclera: Conjunctivae normal.  Neck:     Musculoskeletal: Normal range of motion. No neck rigidity or muscular tenderness.  Cardiovascular:     Rate and Rhythm: Normal rate and regular rhythm.     Heart sounds: No murmur.  Pulmonary:     Effort: Pulmonary effort is normal. No respiratory distress.     Breath sounds: No stridor. No wheezing, rhonchi or rales.  Abdominal:     General: Abdomen is protuberant. Bowel sounds are normal. There is no distension.     Palpations: Abdomen is soft. There is no hepatomegaly or splenomegaly.     Tenderness: There is no abdominal tenderness.  Musculoskeletal:  Normal range of motion.     Right lower leg: No edema.     Left lower leg: No edema.  Lymphadenopathy:     Cervical: No cervical adenopathy.  Skin:    General: Skin is warm and dry.  Neurological:     General: No focal deficit present.     Mental Status: He is alert.  Psychiatric:        Mood and Affect: Mood normal.        Behavior: Behavior normal.     Lab Results  Component Value Date   WBC 6.7 09/18/2018   HGB 14.5 09/18/2018   HCT 43.3 09/18/2018   PLT 332.0 09/18/2018   GLUCOSE 90 03/08/2019   CHOL 108 09/18/2018   TRIG 157.0 (H) 09/18/2018   HDL 26.80 (L) 09/18/2018   LDLCALC 49 09/18/2018   ALT 17 09/18/2018   AST 18 09/18/2018   NA 135 03/08/2019   K 4.5 03/08/2019   CL 100 03/08/2019   CREATININE 0.86 03/08/2019   BUN 15 03/08/2019   CO2 28 03/08/2019   TSH 1.40 03/08/2019   PSA 0.28 03/08/2019   INR 1.03 07/29/2015   HGBA1C 6.3 03/08/2019    Mr Cervical Spine Wo Contrast  Result Date: 02/04/2019  [facility] [address], [place], [place] [address] [phone] NEUROIMAGING REPORT STUDY DATE: 02/02/2019 PATIENT NAME: [name] [name] DOB: [birth year] MRN: [id number] EXAM: MRI of the cervical spine ORDERING CLINICIAN: Antony Contras, MD CLINICAL HISTORY: 73 year old man with cervical radiculopathy COMPARISON FILMS: MRI 02/22/2018 TECHNIQUE: MRI of the cervical spine was obtained utilizing 3 mm sagittal slices from the posterior fossa down to the T3-4 level with T1, T2 and inversion recovery views. In addition 4 mm axial slices from [id number] down to T1-2 level were included with T2 and gradient echo views. CONTRAST: None IMAGING SITE: [facility], [place], [place], [place] FINDINGS: :  On sagittal images, the spine is imaged from above the cervicomedullary junction to T2.   The spinal cord is of normal caliber.  There is T2 hyperintense signal within the cord adjacent to C3-C4 consistent with chronic mild low malacia, stable compared to  the previous MRI.[name]  There is a solid fusion at C3-C4 associated with ACDF hardware.  Reduced disc height is noted at C4-C5, C6-C7 and C7-T1.  The vertebral bodies are normally aligned.   The vertebral bodies have normal signal.  The discs and interspaces were further evaluated on axial views from C2 to T1 as follows: C2-C3: There is a small right disc osteophyte complex causing mild right foraminal narrowing but no nerve root compression or spinal stenosis. C3-C4: This level is fused.  There is a right paramedian disc osteophyte complex and  right facet hypertrophy causing moderate right foraminal narrowing.  There is also mild left foraminal narrowing.  No nerve root compression or spinal stenosis. C4-C5: There are disc osteophyte complexes and facet hypertrophy.  This causes mild spinal stenosis and moderate bilateral foraminal narrowing. C5-C6: There are disc osteophyte complexes, right greater than left causing mild spinal stenosis, severe right foraminal narrowing and moderate left foraminal narrowing. C6-C7: There is disc protrusion and uncovertebral spurring and ligamenta flava hypertrophy causing mild spinal stenosis and moderate bilateral foraminal narrowing. C7-T1: There is a right disc osteophyte complex, ligamenta flava hypertrophy and left greater than right facet hypertrophy.  There is no spinal stenosis but there is progressed moderate right and stable mild to moderate left foraminal narrowing. T1-T2: There is disc protrusion and ligamenta flava hypertrophy causing borderline spinal stenosis and mild to moderate foraminal narrowing. When compared to the MRI dated 02/22/2018, changes at C7-T1 have progressed with more foraminal narrowing to the right on the current study.   This MRI of the cervical spine without contrast shows the following: 1.   At C3-C4, there is stable ACDF.  There is moderate right foraminal narrowing but no definite nerve root compression or spinal stenosis. 2.   At C4-C5, there  are stable degenerative changes causing stable mild spinal stenosis and moderate foraminal narrowing but no definite nerve root compression.. 3.   At C5-C6, there are stable degenerative changes causing mild spinal stenosis and severe right foraminal narrowing with potential for right C6 nerve root compression 4.   At C6-C7, there are stable degenerative changes causing mild spinal stenosis and moderate foraminal narrowing but no definite nerve root compression. 5.   At C7-T1, there are progressive degenerative changes causing moderate right foraminal narrowing and moderate left foraminal narrowing.  There is no definite nerve root compression though there is more encroachment upon the right C8 nerve root compression than was present on the previous MRI. 6.   At T1-T2, there are degenerative changes causing borderline spinal stenosis but no nerve root compression. 7.   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 Felecia Shelling, MD, PhD, FAAN Certified in  Neuroimaging by [employer]    Assessment & Plan:   Sabastien was seen today for hypertension and hypothyroidism.  Diagnoses and all orders for this visit:  Need for influenza vaccination -     Flu Vaccine QUAD High Dose(Fluad)  Essential hypertension- His blood pressure is adequately well controlled.  Electrolytes and renal function are normal. -     Basic metabolic panel; Future  Acquired hypothyroidism- His TSH is in the normal range.  He will remain on the current dose of levothyroxine. -     TSH; Future  Need for pneumococcal vaccination -     Pneumococcal polysaccharide vaccine 23-valent greater than or equal to 2yo subcutaneous/IM  Benign prostatic hyperplasia without lower urinary tract symptoms- His PSA is low which is reassuring that he does not have prostate cancer.  He has no symptoms that need to be treated. -     PSA; Future  Prediabetes- His  A1c is at 6.3%.  Medical therapy is not indicated. -     Hemoglobin A1c; Future   I am having Shawndell D. Neumann maintain his aspirin EC, gabapentin, simvastatin, Trospium Chloride, metoprolol succinate, polyethylene glycol, UNABLE TO FIND, losartan, DULoxetine, omeprazole, and levothyroxine.  No orders of the defined types were placed in this encounter.  Follow-up: Return in about 6 months (around 09/05/2019).  Scarlette Calico, MD

## 2019-05-04 ENCOUNTER — Other Ambulatory Visit: Payer: Self-pay | Admitting: Internal Medicine

## 2019-05-04 DIAGNOSIS — E039 Hypothyroidism, unspecified: Secondary | ICD-10-CM

## 2019-07-15 NOTE — Progress Notes (Signed)
Cardiology Office Note   Date:  07/16/2019   ID:  [name] [name], DOB 11/04/1945, MRN [id number]  PCP:  [name] Lima, MD  Cardiologist:   [name] Carnes, MD   F/U OF CAD    History of Present Illness: [name] [name] is a 74 y.o. male with a history of  Nonobstructive CAD  LVEF normal GERD, HTN, HL OSA, thoracic aneurysm  Mild 4.1 mm  Seen by [name] [name]  And [name] [name]  Since seen in clinic, he has done OK.  Breathing is stable.  He notes occasional episodes of chest discomfort when he sitting at the computer.  It is right sided, radiates to his neck and head.  PCP thinks it is due to spinal issues.  He continues to fall and  has hit his head at times.  Denies syncopal spells.  Current Meds  Medication Sig  . aspirin EC 81 MG tablet Take 1 tablet (81 mg total) by mouth 2 (two) times daily.  . DULoxetine (CYMBALTA) 30 MG capsule TAKE 1 CAPSULE BY MOUTH  DAILY  . gabapentin (NEURONTIN) 300 MG capsule TAKE 1 CAPSULE BY MOUTH TWICE PER DAY  . levothyroxine (SYNTHROID) 150 MCG tablet Take 1 tablet (150 mcg total) by mouth daily before breakfast.  . losartan (COZAAR) 50 MG tablet TAKE 1 TABLET BY MOUTH  DAILY  . metoprolol succinate (TOPROL XL) 25 MG 24 hr tablet Take 1 tablet (25 mg total) by mouth daily.  [name] omeprazole (PRILOSEC) 40 MG capsule TAKE 1 CAPSULE BY MOUTH  DAILY 30 MINUTES BEFORE  BREAKFAST  . polyethylene glycol (MIRALAX) 17 g packet Take 17 g by mouth daily.  . simvastatin (ZOCOR) 20 MG tablet Take 1 tablet (20 mg total) by mouth daily at 6 PM. Please keep upcoming appt in December for future refills. Thank you  . UNABLE TO FIND Mega red 900mg  one per day     Allergies:   Penicillins, Codeine, and Sulfonamide derivatives   Past Medical History:  Diagnosis Date  . Arthritis   . CAD (coronary artery disease)    cath 3/10: mLAD 30%, pRCA 30%, EF 55-60% [name] [name] cardiologist  most recent  stress stress done ~ 2 years ago with [name] [name]  . Complication of anesthesia    "he  usually gets an ileus after back OR"   . Depression   . Dyspnea    -PFTs compeltely normal 03/20/08 including DLc0  . Environmental allergies    Dust, Smoke  . GERD (gastroesophageal reflux disease)   . HTN (hypertension)   . Hypothyroidism   . Low testosterone   . Observed sleep apnea    can't wear cpap  . Polycythemia   . PONV (postoperative nausea and vomiting)   . Pulmonary nodule 06/13/2012  . Rosacea   . Sleep apnea    Most recent sleep study 2010; records at [facility]    Past Surgical History:  Procedure Laterality Date  . BACK SURGERY  2005; 06/2006; 01/2007; 04/2009  . CARDIAC CATHETERIZATION  08/2008  . CATARACT EXTRACTION, BILATERAL  2010  . CERVICAL FUSION  04/2002   ?C3-4  . COLONOSCOPY  12/2005   normal screening study.   . ESOPHAGOGASTRODUODENOSCOPY  multiple   last 07/2012 GERD esophagitis, 48 Fr dilation  . ESOPHAGOGASTRODUODENOSCOPY N/A 09/19/2015   Procedure: ESOPHAGOGASTRODUODENOSCOPY (EGD);  Surgeon: [name] Artist, MD;  Location: [facility] ENDOSCOPY;  Service: Endoscopy;  Laterality: N/A;  . KNEE ARTHROSCOPY WITH MEDIAL MENISECTOMY Right 05/14/2015  Procedure: RIGHT KNEE ARTHROSCOPY CHONDROPLASTY, PARTIAL  MEDIAL MENISECTOMY;  Surgeon: [name] Server, MD;  Location: [place];  Service: Orthopedics;  Laterality: Right;  . left knee replacement    . left shoudler clean    . LUMBAR DISC SURGERY  06/2006   L3  . MENISCUS REPAIR  08/2008   left  . NECK SURGERY    . POSTERIOR FUSION LUMBAR SPINE  2005; 01/2007; 04/2009   L5; L3-4; L2-3  . PROSTATE SURGERY  2000  . SAVORY DILATION N/A 09/19/2015   Procedure: SAVORY DILATION;  Surgeon: [name] Artist, MD;  Location: [facility] ENDOSCOPY;  Service: Endoscopy;  Laterality: N/A;  . SHOULDER OPEN ROTATOR CUFF REPAIR Left 1999   left  . TONSILLECTOMY  09/21/11  . TOTAL KNEE ARTHROPLASTY Left 01/2011   left  . TOTAL KNEE ARTHROPLASTY Right 08/08/2015   Procedure: RIGHT TOTAL KNEE ARTHROPLASTY;  Surgeon:  [name] Server, MD;  Location: [place];  Service: Orthopedics;  Laterality: Right;  . TRANSURETHRAL RESECTION OF PROSTATE  2006   followed by "surgery to get rid of clots"  . UVULOPALATOPHARYNGOPLASTY, TONSILLECTOMY AND SEPTOPLASTY  09/21/11   Deviated Septum     Social History:  The patient  reports that he quit smoking about 45 years ago. He has quit using smokeless tobacco. He reports that he does not drink alcohol or use drugs.   Family History:  The patient's family history includes Alzheimer's disease in his cousin and father; Bipolar disorder in an other family member; Breast cancer in his mother and sister; Breast cancer (age of onset: 80) in his sister; Cirrhosis in his maternal uncle; Colon polyps in his mother; Diabetes in his mother and sister; Heart Problems in his sister; Heart attack in his cousin, father, and paternal grandfather; Liver cancer in his maternal uncle; Lung cancer in his paternal uncle, paternal uncle, and paternal uncle; Other in his daughter and sister; Parkinson's disease in an other family member; Stroke in his paternal grandmother; Throat cancer in his maternal uncle.    ROS:  Please see the history of present illness. All other systems are reviewed and  Negative to the above problem except as noted.    PHYSICAL EXAM: VS:  BP 118/68   Pulse 82   Ht 5\' 10"  (1.778 m)   Wt 281 lb 6.4 oz (127.6 kg)   SpO2 94%   BMI 40.38 kg/m   GEN: Morbidly obese 74 yo in no acute distress  HEENT: normal  Neck: JVP is normal  No carotid bruits Cardiac: RRR; grade 1/6 to 2/6 systolic murmur left lower sternal border rubs, or gallops,  Tr   edema  Respiratory:  clear to auscultation bilaterally, normal work of breathing GI: Obese.  Distended.  Nontender. MS: No deformity   Deferred    Skin: warm and dry, no rash feet calves are red Neuro:  Strength and sensation are intact Psych: euthymic mood, full affect   EKG:  EKG is not ordered  Lipid Panel    Component Value  Date/Time   CHOL 108 09/18/2018 0956   TRIG 157.0 (H) 09/18/2018 0956   HDL 26.80 (L) 09/18/2018 0956   CHOLHDL 4 09/18/2018 0956   VLDL 31.4 09/18/2018 0956   LDLCALC 49 09/18/2018 0956      Wt Readings from Last 3 Encounters:  07/16/19 281 lb 6.4 oz (127.6 kg)  03/08/19 280 lb (127 kg)  12/28/18 281 lb (127.5 kg)      ASSESSMENT AND PLAN: 1 history  of dyspnea.  Breathing is stable.  Probably multifactoral    2 CAD.  Nonobstructive..  Chest pain is atypical   I think it is radiating more from the neck to the chest and head.  I do not think this represents angina   2  Aorta  Aorta is 4.1 on CT back in June.  Will follow up next fall  3  Lipids.  Keep on statin.  Will have labs when he goes for his yearly physical  4  HTN   BP is good control keep on current regimen.  5  Falls.  Extreme gait limitations.  He comes with a walker has difficulty standing.  We will set follow-up for the fall.    Current medicines are reviewed at length with the patient today.  The patient does not have concerns regarding medicines.  Signed, [name] Carnes, MD  07/16/2019 9:49 AM    [address], [place], [place]  [address] Phone: [phone]; Fax: [phone]

## 2019-07-16 ENCOUNTER — Encounter (INDEPENDENT_AMBULATORY_CARE_PROVIDER_SITE_OTHER): Payer: Self-pay

## 2019-07-16 ENCOUNTER — Other Ambulatory Visit: Payer: Self-pay

## 2019-07-16 ENCOUNTER — Ambulatory Visit: Payer: Medicare Other | Admitting: Internal Medicine

## 2019-07-16 ENCOUNTER — Encounter: Payer: Self-pay | Admitting: Internal Medicine

## 2019-07-16 VITALS — BP 118/68 | HR 82 | Ht 70.0 in | Wt 281.4 lb

## 2019-07-16 DIAGNOSIS — E782 Mixed hyperlipidemia: Secondary | ICD-10-CM | POA: Diagnosis not present

## 2019-07-16 DIAGNOSIS — I2510 Atherosclerotic heart disease of native coronary artery without angina pectoris: Secondary | ICD-10-CM

## 2019-07-16 DIAGNOSIS — I10 Essential (primary) hypertension: Secondary | ICD-10-CM

## 2019-07-16 NOTE — Patient Instructions (Addendum)
Medication Instructions:  No changes *If you need a refill on your cardiac medications before your next appointment, please call your pharmacy*  Lab Work: none If you have labs (blood work) drawn today and your tests are completely normal, you will receive your results only by: [name] MyChart Message (if you have MyChart) OR . A paper copy in the mail If you have any lab test that is abnormal or we need to change your treatment, we will call you to review the results.  Testing/Procedures: none  Follow-Up: At [facility], you and your health needs are our priority.  As part of our continuing mission to provide you with exceptional heart care, we have created designated Provider Care Teams.  These Care Teams include your primary Cardiologist (physician) and Advanced Practice Providers (APPs -  Physician Assistants and Nurse Practitioners) who all work together to provide you with the care you need, when you need it.  Your next appointment:   10 month(s) (November)  The format for your next appointment:   Either In Person or Virtual  Provider:   You may see [name], MD or one of the following Advanced Practice Providers on your designated Care Team:    Richardson Dopp, PA-C  [place], [place]  Daune Perch, NP   Other Instructions

## 2019-07-24 ENCOUNTER — Other Ambulatory Visit: Payer: Self-pay | Admitting: Internal Medicine

## 2019-07-25 ENCOUNTER — Other Ambulatory Visit: Payer: Self-pay

## 2019-07-25 MED ORDER — METOPROLOL SUCCINATE ER 25 MG PO TB24: 25.0000 mg | ORAL_TABLET | Freq: Every day | ORAL | 3 refills | Status: DC

## 2019-07-28 ENCOUNTER — Other Ambulatory Visit: Payer: Self-pay | Admitting: Internal Medicine

## 2019-07-28 DIAGNOSIS — K2210 Ulcer of esophagus without bleeding: Secondary | ICD-10-CM

## 2019-07-28 DIAGNOSIS — R1314 Dysphagia, pharyngoesophageal phase: Secondary | ICD-10-CM

## 2019-09-05 ENCOUNTER — Other Ambulatory Visit: Payer: Self-pay

## 2019-09-05 ENCOUNTER — Encounter: Payer: Self-pay | Admitting: Internal Medicine

## 2019-09-05 ENCOUNTER — Ambulatory Visit: Payer: Medicare Other | Admitting: Internal Medicine

## 2019-09-05 VITALS — BP 144/82 | HR 70 | Temp 98.2°F | Resp 16 | Ht 70.0 in | Wt 283.0 lb

## 2019-09-05 DIAGNOSIS — I517 Cardiomegaly: Secondary | ICD-10-CM

## 2019-09-05 DIAGNOSIS — E039 Hypothyroidism, unspecified: Secondary | ICD-10-CM

## 2019-09-05 DIAGNOSIS — B351 Tinea unguium: Secondary | ICD-10-CM

## 2019-09-05 DIAGNOSIS — I10 Essential (primary) hypertension: Secondary | ICD-10-CM | POA: Diagnosis not present

## 2019-09-05 DIAGNOSIS — E781 Pure hyperglyceridemia: Secondary | ICD-10-CM

## 2019-09-05 DIAGNOSIS — I2510 Atherosclerotic heart disease of native coronary artery without angina pectoris: Secondary | ICD-10-CM | POA: Diagnosis not present

## 2019-09-05 DIAGNOSIS — E785 Hyperlipidemia, unspecified: Secondary | ICD-10-CM | POA: Diagnosis not present

## 2019-09-05 DIAGNOSIS — R7303 Prediabetes: Secondary | ICD-10-CM

## 2019-09-05 DIAGNOSIS — R260 Ataxic gait: Secondary | ICD-10-CM | POA: Insufficient documentation

## 2019-09-05 LAB — CBC WITH DIFFERENTIAL/PLATELET
Basophils Absolute: 0.1 10*3/uL (ref 0.0–0.1)
Basophils Relative: 1.1 % (ref 0.0–3.0)
Eosinophils Absolute: 0.1 10*3/uL (ref 0.0–0.7)
Eosinophils Relative: 2.3 % (ref 0.0–5.0)
HCT: 44.1 % (ref 39.0–52.0)
Hemoglobin: 14.5 g/dL (ref 13.0–17.0)
Lymphocytes Relative: 30 % (ref 12.0–46.0)
Lymphs Abs: 1.5 10*3/uL (ref 0.7–4.0)
MCHC: 32.8 g/dL (ref 30.0–36.0)
MCV: 90.4 fl (ref 78.0–100.0)
Monocytes Absolute: 0.5 10*3/uL (ref 0.1–1.0)
Monocytes Relative: 10.6 % (ref 3.0–12.0)
Neutro Abs: 2.8 10*3/uL (ref 1.4–7.7)
Neutrophils Relative %: 56 % (ref 43.0–77.0)
Platelets: 208 10*3/uL (ref 150.0–400.0)
RBC: 4.88 Mil/uL (ref 4.22–5.81)
RDW: 15.5 % (ref 11.5–15.5)
WBC: 5.1 10*3/uL (ref 4.0–10.5)

## 2019-09-05 LAB — BASIC METABOLIC PANEL
BUN: 13 mg/dL (ref 6–23)
CO2: 28 mEq/L (ref 19–32)
Calcium: 9.5 mg/dL (ref 8.4–10.5)
Chloride: 101 mEq/L (ref 96–112)
Creatinine, Ser: 0.79 mg/dL (ref 0.40–1.50)
GFR: 96.24 mL/min (ref >60.00)
Glucose, Bld: 91 mg/dL (ref 70–99)
Potassium: 4.3 mEq/L (ref 3.5–5.1)
Sodium: 134 mEq/L — ABNORMAL LOW (ref 135–145)

## 2019-09-05 LAB — LIPID PANEL
Cholesterol: 103 mg/dL (ref 0–200)
HDL: 29.2 mg/dL — ABNORMAL LOW (ref >39.00)
LDL Cholesterol: 49 mg/dL (ref 0–99)
NonHDL: 74.17
Total CHOL/HDL Ratio: 4
Triglycerides: 128 mg/dL (ref 0.0–149.0)
VLDL: 25.6 mg/dL (ref 0.0–40.0)

## 2019-09-05 LAB — HEPATIC FUNCTION PANEL
ALT: 24 U/L (ref 0–53)
AST: 25 U/L (ref 0–37)
Albumin: 4 g/dL (ref 3.5–5.2)
Alkaline Phosphatase: 74 U/L (ref 39–117)
Bilirubin, Direct: 0.1 mg/dL (ref 0.0–0.3)
Total Bilirubin: 0.7 mg/dL (ref 0.2–1.2)
Total Protein: 8.1 g/dL (ref 6.0–8.3)

## 2019-09-05 LAB — HEMOGLOBIN A1C: Hgb A1c MFr Bld: 6.2 % (ref 4.6–6.5)

## 2019-09-05 LAB — TSH: TSH: 0.51 u[IU]/mL (ref 0.35–4.50)

## 2019-09-05 NOTE — Patient Instructions (Signed)
Coronary Artery Disease, Male Coronary artery disease (CAD) is a condition in which the arteries that lead to the heart (coronary arteries) become narrow or blocked. The narrowing or blockage can lead to decreased blood flow to the heart. Prolonged reduced blood flow can cause a heart attack (myocardial infarction or MI). This condition may also be called coronary heart disease. Because CAD is the leading cause of death in men, it is important to understand what causes this condition and how it is treated. What are the causes? CAD is most often caused by atherosclerosis. This is the buildup of fat and cholesterol (plaque) on the inside of the arteries. Over time, the plaque may narrow or block the artery, reducing blood flow to the heart. Plaque can also become weak and break off within a coronary artery and cause a sudden blockage. Other less common causes of CAD include:  A blood clot or a piece of a blood clot or other substance that blocks the flow of blood in a coronary artery (embolism).  A tearing of the artery (spontaneous coronary artery dissection).  An enlargement of an artery (aneurysm).  Inflammation (vasculitis) in the artery wall. What increases the risk? The following factors may make you more likely to develop this condition:  Age. Men over age 29 are at a greater risk of CAD.  Family history of CAD.  Gender. Men often develop CAD earlier in life than women.  High blood pressure (hypertension).  Diabetes.  High cholesterol levels.  Tobacco use.  Excessive alcohol use.  Lack of exercise.  A diet high in saturated and trans fats, such as fried food and processed meat. Other possible risk factors include:  High stress levels.  Depression.  Obesity.  Sleep apnea. What are the signs or symptoms? Many people do not have any symptoms during the early stages of CAD. As the condition progresses, symptoms may include:  Chest pain (angina). The pain can: ? Feel  like crushing or squeezing, or like a tightness, pressure, fullness, or heaviness in the chest. ? Last more than a few minutes or can stop and recur. The pain tends to get worse with exercise or stress and to fade with rest.  Pain in the arms, neck, jaw, ear, or back.  Unexplained heartburn or indigestion.  Shortness of breath.  Nausea or vomiting.  Sudden light-headedness.  Sudden cold sweats.  Fluttering or fast heartbeat (palpitations). How is this diagnosed? This condition is diagnosed based on:  Your family and medical history.  A physical exam.  Tests, including: ? A test to check the electrical signals in your heart (electrocardiogram). ? Exercise stress test. This looks for signs of blockage when the heart is stressed with exercise, such as running on a treadmill. ? Pharmacologic stress test. This test looks for signs of blockage when the heart is being stressed with a medicine. ? Blood tests. ? Coronary angiogram. This is a procedure to look at the coronary arteries to see if there is any blockage. During this test, a dye is injected into your arteries so they appear on an X-Jahmez. ? Coronary artery CT scan. This CT scan helps detect calcium deposits in your coronary arteries. Calcium deposits are an indicator of CAD. ? A test that uses sound waves to take a picture of your heart (echocardiogram). ? Chest X-Sir. How is this treated? This condition may be treated by:  Healthy lifestyle changes to reduce risk factors.  Medicines such as: ? Antiplatelet medicines and blood-thinning medicines, such  as aspirin. These help to prevent blood clots. ? Nitroglycerin. ? Blood pressure medicines. ? Cholesterol-lowering medicine.  Coronary angioplasty and stenting. During this procedure, a thin, flexible tube is inserted through a blood vessel and into a blocked artery. A balloon or similar device on the end of the tube is inflated to open up the artery. In some cases, a small,  mesh tube (stent) is inserted into the artery to keep it open.  Coronary artery bypass surgery. During this surgery, veins or arteries from other parts of the body are used to create a bypass around the blockage and allow blood to reach your heart. Follow these instructions at home: Medicines  Take over-the-counter and prescription medicines only as told by your health care provider.  Do not take the following medicines unless your health care provider approves: ? NSAIDs, such as ibuprofen, naproxen, or celecoxib. ? Vitamin supplements that contain vitamin A, vitamin E, or both. Lifestyle  Follow an exercise program approved by your health care provider. Aim for 150 minutes of moderate exercise or 75 minutes of vigorous exercise each week.  Maintain a healthy weight or lose weight as approved by your health care provider.  Learn to manage stress or try to limit your stress. Ask your health care provider for suggestions if you need help.  Get screened for depression and seek treatment, if needed.  Do not use any products that contain nicotine or tobacco, such as cigarettes, e-cigarettes, and chewing tobacco. If you need help quitting, ask your health care provider.  Do not use illegal drugs. Eating and drinking   Follow a heart-healthy diet. A dietitian can help educate you about healthy food options and changes. In general, eat plenty of fruits and vegetables, lean meats, and whole grains.  Avoid foods high in: ? Sugar. ? Salt (sodium). ? Saturated fat, such as processed or fatty meat. ? Trans fat, such as fried foods.  Use healthy cooking methods such as roasting, grilling, broiling, baking, poaching, steaming, or stir-frying.  Do not drink alcohol if your health care provider tells you not to drink.  If you drink alcohol: ? Limit how much you have to 0-2 drinks per day. ? Be aware of how much alcohol is in your drink. In the [place] one drink equals one 12 oz bottle of beer  (355 mL), one 5 oz glass of wine (148 mL), or one 1 oz glass of hard liquor (44 mL). General instructions  Manage any other health conditions, such as hypertension and diabetes. These conditions affect your heart.  Your health care provider may ask you to monitor your blood pressure. Ideally, your blood pressure should be below 130/80.  Keep all follow-up visits as told by your health care provider. This is important. Get help right away if:  You have pain in your chest, neck, ear, arm, jaw, stomach, or back that: ? Lasts more than a few minutes. ? Is recurring. ? Is not relieved by taking medicine under your tongue (sublingual nitroglycerin).  You have profuse sweating without cause.  You have unexplained: ? Heartburn or indigestion. ? Shortness of breath or difficulty breathing. ? Fluttering or fast heartbeat (palpitations). ? Nausea or vomiting. ? Fatigue. ? Feelings of nervousness or anxiety. ? Weakness. ? Diarrhea.  You have sudden light-headedness or dizziness.  You faint.  You feel like hurting yourself or think about taking your own life. These symptoms may represent a serious problem that is an emergency. Do not wait to see if the  symptoms will go away. Get medical help right away. Call your local emergency services (911 in the [place] Do not drive yourself to the hospital. Summary  Coronary artery disease (CAD) is a condition in which the arteries that lead to the heart (coronary arteries) become narrow or blocked. The narrowing or blockage can lead to a heart attack.  Many people do not have any symptoms during the early stages of CAD.  CAD can be treated with lifestyle changes, medicines, surgery, or a combination of these treatments. This information is not intended to replace advice given to you by your health care provider. Make sure you discuss any questions you have with your health care provider. Document Revised: 03/03/2018 Document Reviewed:  02/21/2018 Elsevier Patient Education  2020 [employer]  

## 2019-09-05 NOTE — Progress Notes (Signed)
Subjective:  Patient ID: [name] [name], male    DOB: 1945/08/19  Age: 74 y.o. MRN: [id number]  CC: Hypertension, Hypothyroidism, and Coronary Artery Disease  This visit occurred during the SARS-CoV-2 public health emergency.  Safety protocols were in place, including screening questions prior to the visit, additional usage of staff PPE, and extensive cleaning of exam room while observing appropriate contact time as indicated for disinfecting solutions.    HPI [name] [name] presents for f/up -he has a several year history of frequent falls.  Some with injuries some without injury.  He tells me the falls are related to chronic low back pain and having undergone several back surgeries as well as chronic bilateral knee pain and having undergone knee surgeries.  He says the falls are always mechanical and related to difficulty with range of motion in his back and knees.  He does not experience dizziness, lightheadedness, or palpitations.  He does complain of chronic shortness of breath and is not very active.  He says that he saw cardiology 2 months ago but an EKG was not done during that visit.  Outpatient Medications Prior to Visit  Medication Sig Dispense Refill  . aspirin EC 81 MG tablet Take 1 tablet (81 mg total) by mouth 2 (two) times daily.    [name] gabapentin (NEURONTIN) 300 MG capsule TAKE 1 CAPSULE BY MOUTH TWICE PER DAY    . levothyroxine (SYNTHROID) 150 MCG tablet Take 1 tablet (150 mcg total) by mouth daily before breakfast. 90 tablet 1  . metoprolol succinate (TOPROL XL) 25 MG 24 hr tablet Take 1 tablet (25 mg total) by mouth daily. 90 tablet 3  . polyethylene glycol (MIRALAX) 17 g packet Take 17 g by mouth daily.    . simvastatin (ZOCOR) 20 MG tablet Take 1 tablet (20 mg total) by mouth daily at 6 PM. Please keep upcoming appt in December for future refills. Thank you 90 tablet 1  . UNABLE TO FIND Mega red 900mg  one per day    . losartan (COZAAR) 50 MG tablet TAKE 1 TABLET BY MOUTH   DAILY 90 tablet 0  . omeprazole (PRILOSEC) 40 MG capsule TAKE 1 CAPSULE BY MOUTH  DAILY 30 MINUTES BEFORE  BREAKFAST 90 capsule 0  . DULoxetine (CYMBALTA) 30 MG capsule TAKE 1 CAPSULE BY MOUTH  DAILY 90 capsule 0   No facility-administered medications prior to visit.    ROS Review of Systems  Constitutional: Negative.  Negative for diaphoresis and fatigue.  HENT: Negative.   Eyes: Negative.   Respiratory: Positive for shortness of breath. Negative for cough, chest tightness and wheezing.   Cardiovascular: Positive for leg swelling.  Endocrine: Negative.   Genitourinary: Negative.   Musculoskeletal: Positive for arthralgias, back pain and gait problem. Negative for joint swelling and myalgias.  Skin: Negative.   Neurological: Negative for dizziness, weakness, light-headedness, numbness and headaches.  Hematological: Negative for adenopathy. Does not bruise/bleed easily.  Psychiatric/Behavioral: Negative.     Objective:  BP (!) 144/82 (BP Location: Left Arm, Patient Position: Sitting, Cuff Size: Large)   Pulse 70   Temp 98.2 F (36.8 C) (Oral)   Resp 16   Ht 5\' 10"  (1.778 m)   Wt 283 lb (128.4 kg)   SpO2 94%   BMI 40.61 kg/m   BP Readings from Last 3 Encounters:  09/05/19 (!) 144/82  07/16/19 118/68  03/08/19 138/78    Wt Readings from Last 3 Encounters:  09/05/19 283 lb (128.4 kg)  07/16/19  281 lb 6.4 oz (127.6 kg)  03/08/19 280 lb (127 kg)    Physical Exam Vitals reviewed.  Constitutional:      Appearance: He is ill-appearing.  HENT:     Nose: Nose normal.     Mouth/Throat:     Mouth: Mucous membranes are moist.  Eyes:     General: No scleral icterus.    Conjunctiva/sclera: Conjunctivae normal.  Cardiovascular:     Rate and Rhythm: Normal rate and regular rhythm.     Heart sounds: No murmur.     Comments: EKG -  NSR, 67 bpm RBBB, LAFB - new compared to the prior EKG Pulmonary:     Effort: Pulmonary effort is normal.     Breath sounds: No stridor. No  wheezing, rhonchi or rales.  Abdominal:     General: Abdomen is protuberant. Bowel sounds are normal.     Palpations: Abdomen is soft. There is no hepatomegaly, splenomegaly or mass.  Musculoskeletal:     Cervical back: Neck supple.     Right lower leg: 1+ Pitting Edema present.     Left lower leg: 1+ Pitting Edema present.  Lymphadenopathy:     Cervical: No cervical adenopathy.  Skin:    General: Skin is warm and dry.  Neurological:     General: No focal deficit present.     Mental Status: He is alert and oriented to person, place, and time. Mental status is at baseline.     Comments: His gait is wide-based and ataxic.  He has decreased range of motion in his lumbar spine and his knees.  His neurologic exam is nonfocal.     Lab Results  Component Value Date   WBC 5.1 09/05/2019   HGB 14.5 09/05/2019   HCT 44.1 09/05/2019   PLT 208.0 09/05/2019   GLUCOSE 91 09/05/2019   CHOL 103 09/05/2019   TRIG 128.0 09/05/2019   HDL 29.20 (L) 09/05/2019   LDLCALC 49 09/05/2019   ALT 24 09/05/2019   AST 25 09/05/2019   NA 134 (L) 09/05/2019   K 4.3 09/05/2019   CL 101 09/05/2019   CREATININE 0.79 09/05/2019   BUN 13 09/05/2019   CO2 28 09/05/2019   TSH 0.51 09/05/2019   PSA 0.28 03/08/2019   INR 1.03 07/29/2015   HGBA1C 6.2 09/05/2019    MR Cervical Spine Wo Contrast  Result Date: 02/04/2019  [facility] [address] [place], [place] [address] [phone] NEUROIMAGING REPORT STUDY DATE: 02/02/2019 PATIENT NAME: [name] [name] DOB: August 14, 1945 MRN: [id number] EXAM: MRI of the cervical spine ORDERING CLINICIAN: Antony Contras, MD CLINICAL HISTORY: 74 year old man with cervical radiculopathy COMPARISON FILMS: MRI 02/22/2018 TECHNIQUE: MRI of the cervical spine was obtained utilizing 3 mm sagittal slices from the posterior fossa down to the T3-4 level with T1, T2 and inversion recovery views. In addition 4 mm axial slices from [id number] down to T1-2 level were included with  T2 and gradient echo views. CONTRAST: None IMAGING SITE: [facility], [place], [place], [place] FINDINGS: :  On sagittal images, the spine is imaged from above the cervicomedullary junction to T2.   The spinal cord is of normal caliber.  There is T2 hyperintense signal within the cord adjacent to C3-C4 consistent with chronic mild low malacia, stable compared to the previous MRI.[name]  There is a solid fusion at C3-C4 associated with ACDF hardware.  Reduced disc height is noted at C4-C5, C6-C7 and C7-T1.  The vertebral bodies are normally aligned.  The vertebral bodies have normal signal.  The discs and interspaces were further evaluated on axial views from C2 to T1 as follows: C2-C3: There is a small right disc osteophyte complex causing mild right foraminal narrowing but no nerve root compression or spinal stenosis. C3-C4: This level is fused.  There is a right paramedian disc osteophyte complex and right facet hypertrophy causing moderate right foraminal narrowing.  There is also mild left foraminal narrowing.  No nerve root compression or spinal stenosis. C4-C5: There are disc osteophyte complexes and facet hypertrophy.  This causes mild spinal stenosis and moderate bilateral foraminal narrowing. C5-C6: There are disc osteophyte complexes, right greater than left causing mild spinal stenosis, severe right foraminal narrowing and moderate left foraminal narrowing. C6-C7: There is disc protrusion and uncovertebral spurring and ligamenta flava hypertrophy causing mild spinal stenosis and moderate bilateral foraminal narrowing. C7-T1: There is a right disc osteophyte complex, ligamenta flava hypertrophy and left greater than right facet hypertrophy.  There is no spinal stenosis but there is progressed moderate right and stable mild to moderate left foraminal narrowing. T1-T2: There is disc protrusion and ligamenta flava hypertrophy causing borderline spinal stenosis and mild to moderate foraminal  narrowing. When compared to the MRI dated 02/22/2018, changes at C7-T1 have progressed with more foraminal narrowing to the right on the current study.   This MRI of the cervical spine without contrast shows the following: 1.   At C3-C4, there is stable ACDF.  There is moderate right foraminal narrowing but no definite nerve root compression or spinal stenosis. 2.   At C4-C5, there are stable degenerative changes causing stable mild spinal stenosis and moderate foraminal narrowing but no definite nerve root compression.. 3.   At C5-C6, there are stable degenerative changes causing mild spinal stenosis and severe right foraminal narrowing with potential for right C6 nerve root compression 4.   At C6-C7, there are stable degenerative changes causing mild spinal stenosis and moderate foraminal narrowing but no definite nerve root compression. 5.   At C7-T1, there are progressive degenerative changes causing moderate right foraminal narrowing and moderate left foraminal narrowing.  There is no definite nerve root compression though there is more encroachment upon the right C8 nerve root compression than was present on the previous MRI. 6.   At T1-T2, there are degenerative changes causing borderline spinal stenosis but no nerve root compression. 7.   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 Felecia Shelling, MD, PhD, FAAN Certified in  Neuroimaging by [employer]    Assessment & Plan:   [name] [name] was seen today for hypertension, hypothyroidism and coronary artery disease.  Diagnoses and all orders for this visit:  Atherosclerosis of native coronary artery of native heart without angina pectoris- He complains of DOE with minimal exertion and has some EKG changes.  I have asked him to follow-up with cardiology again. -     Lipid panel -     EKG 12-Lead -     Ambulatory referral to Cardiology  Essential hypertension- His  blood pressure is adequately well controlled. -     CBC with Differential/Platelet -     Basic metabolic panel  Mild concentric left ventricular hypertrophy (LVH)- See above.  Acquired hypothyroidism- His TSH is in the normal range.  He will remain on the current dose of levothyroxine. -     TSH  Hyperlipidemia LDL goal <70- He has achieved his LDL goal  and is doing well on the statin. -     Lipid panel -     Hepatic function panel  Prediabetes- His A1c is at 6.2%.  Medical therapy is not indicated. -     Hemoglobin A1c  Pure hyperglyceridemia- His triglycerides are normal now. 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  I have encouraged him to continue using a 4 pronged walker for safety.   I have discontinued Jayse D. Rumpf's DULoxetine, omeprazole, and losartan. I am also having him maintain his aspirin EC, gabapentin, simvastatin, polyethylene glycol, UNABLE TO FIND, levothyroxine, and metoprolol succinate.  No orders of the defined types were placed in this encounter.    Follow-up: Return in about 3 months (around 12/06/2019).  Scarlette Calico, MD

## 2019-09-06 NOTE — Progress Notes (Deleted)
Cardiology Office Note   Date:  09/06/2019   ID:  [name] [name], DOB September 30, 1945, MRN [id number]  PCP:  [name] Lima, MD  Cardiologist:   [name], MD   F/U OF CAD    History of Present Illness: [name] is a 74 y.o. 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     Allergies:   Penicillins, Codeine, and Sulfonamide derivatives   Past Medical History:  Diagnosis Date  . Arthritis   . CAD (coronary artery disease)    cath 3/10: mLAD 30%, pRCA 30%, EF 55-60% Dr. Harrington Challenger cardiologist  most recent  stress stress done ~ 2 years ago with Dr. Harrington Challenger  . Complication of anesthesia    "he usually gets an ileus after back OR"   . Depression   . Dyspnea    -PFTs compeltely normal 03/20/08 including DLc0  . Environmental allergies    Dust, Smoke  . GERD (gastroesophageal reflux disease)   . HTN (hypertension)   . Hypothyroidism   . Low testosterone   . Observed sleep apnea    can't wear cpap  . Polycythemia   . PONV (postoperative nausea and vomiting)   . Pulmonary nodule 06/13/2012  . Rosacea   . Sleep apnea    Most recent sleep study 2010; records at [facility]    Past Surgical History:  Procedure Laterality Date  . BACK SURGERY  2005; 06/2006; 01/2007; 04/2009  . CARDIAC CATHETERIZATION  08/2008  . CATARACT EXTRACTION, BILATERAL  2010  . CERVICAL FUSION  04/2002   ?C3-4  . COLONOSCOPY  12/2005   normal screening study.   . ESOPHAGOGASTRODUODENOSCOPY  multiple   last 07/2012 GERD esophagitis, 48 Fr dilation  . ESOPHAGOGASTRODUODENOSCOPY N/A 09/19/2015   Procedure: ESOPHAGOGASTRODUODENOSCOPY (EGD);  Surgeon: [name], MD;  Location: [facility] ENDOSCOPY;  Service: Endoscopy;  Laterality: N/A;  . KNEE ARTHROSCOPY WITH MEDIAL  MENISECTOMY Right 05/14/2015   Procedure: RIGHT KNEE ARTHROSCOPY CHONDROPLASTY, PARTIAL  MEDIAL MENISECTOMY;  Surgeon: [name], MD;  Location: [facility];  Service: Orthopedics;  Laterality: Right;  . left knee replacement    . left shoudler clean    . LUMBAR DISC SURGERY  06/2006   L3  . MENISCUS REPAIR  08/2008   left  . NECK SURGERY    . POSTERIOR FUSION LUMBAR SPINE  2005; 01/2007; 04/2009   L5; L3-4; L2-3  . PROSTATE SURGERY  2000  . SAVORY DILATION N/A 09/19/2015   Procedure: SAVORY DILATION;  Surgeon: [name], MD;  Location[facility] ENDOSCOPY;  Service: Endoscopy;  Laterality: N/A;  . SHOULDER OPEN ROTATOR CUFF REPAIR Left 1999   left  . TONSILLECTOMY  09/21/11  . TOTAL KNEE ARTHROPLASTY Left 01/2011   left  . TOTAL KNEE ARTHROPLASTY Right 08/08/2015   Procedure: RIGHT TOTAL KNEE ARTHROPLASTY;  Surgeon: [name], MD;  Location: [facility];  Service: Orthopedics;  Laterality: Right;  . TRANSURETHRAL RESECTION OF PROSTATE  2006   followed by "surgery to get rid of clots"  . UVULOPALATOPHARYNGOPLASTY, TONSILLECTOMY AND SEPTOPLASTY  09/21/11   Deviated Septum     Social History:  The patient  reports that he quit smoking about 45  years ago. He has quit using smokeless tobacco. He reports that he does not drink alcohol or use drugs.   Family History:  The patient's family history includes Alzheimer's disease in his cousin and father; Bipolar disorder in an other family member; Breast cancer in his mother and sister; Breast cancer (age of onset: 73) in his sister; Cirrhosis in his maternal uncle; Colon polyps in his mother; Diabetes in his mother and sister; Heart Problems in his sister; Heart attack in his cousin, father, and paternal grandfather; Liver cancer in his maternal uncle; Lung cancer in his paternal uncle, paternal uncle, and paternal uncle; Other in his daughter and sister; Parkinson's disease in an other family member; Stroke in his paternal grandmother;  Throat cancer in his maternal uncle.    ROS:  Please see the history of present illness. All other systems are reviewed and  Negative to the above problem except as noted.    PHYSICAL EXAM: VS:  There were no vitals taken for this visit.  GEN: Morbidly obese 74 yo in no acute distress  HEENT: normal  Neck: JVP is normal  No carotid bruits Cardiac: RRR; grade 1/6 to 2/6 systolic murmur left lower sternal border rubs, or gallops,  Tr   edema  Respiratory:  clear to auscultation bilaterally, normal work of breathing GI: Obese.  Distended.  Nontender. MS: No deformity   Deferred    Skin: warm and dry, no rash feet calves are red Neuro:  Strength and sensation are intact Psych: euthymic mood, full affect   EKG:  EKG is not ordered  Lipid Panel    Component Value Date/Time   CHOL 103 09/05/2019 1051   TRIG 128.0 09/05/2019 1051   HDL 29.20 (L) 09/05/2019 1051   CHOLHDL 4 09/05/2019 1051   VLDL 25.6 09/05/2019 1051   LDLCALC 49 09/05/2019 1051      Wt Readings from Last 3 Encounters:  09/05/19 283 lb (128.4 kg)  07/16/19 281 lb 6.4 oz (127.6 kg)  03/08/19 280 lb (127 kg)      ASSESSMENT AND PLAN: 1 history of dyspnea.  Breathing is stable.  Probably multifactoral    2 CAD.  Nonobstructive..  Chest pain is atypical   I think it is radiating more from the neck to the chest and head.  I do not think this represents angina   2  Aorta  Aorta is 4.1 on CT back in June.  Will follow up next fall  3  Lipids.  Keep on statin.  Will have labs when he goes for his yearly physical  4  HTN   BP is good control keep on current regimen.  5  Falls.  Extreme gait limitations.  He comes with a walker has difficulty standing.  We will set follow-up for the fall.    Current medicines are reviewed at length with the patient today.  The patient does not have concerns regarding medicines.  Signed, [name], MD  09/06/2019 9:27 PM    [address] [place], [place],   [address] Phone: [phone]; Fax: [phone]

## 2019-09-07 ENCOUNTER — Ambulatory Visit: Payer: Medicare Other | Admitting: Internal Medicine

## 2019-09-07 ENCOUNTER — Telehealth: Payer: Self-pay | Admitting: Internal Medicine

## 2019-09-07 NOTE — Telephone Encounter (Signed)
Spoke with patient and his wife. He was scheduled today to be seen by Dr. Harrington Challenger after PCP appointment with EKG changes. PCP note reviewed by Dr. Harrington Challenger and compared to previous EKGs and no changes are noted.    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  The patient reports no new cardiac concerns today.  His appointment will be cancelled.  Pt agrees if Dr. Harrington Challenger says there is no change in his EKG, he does not need to come in.

## 2019-09-07 NOTE — Telephone Encounter (Signed)
Wife wants to know if it is okay if someone comes with patient to his appointment today with Dr. Harrington Challenger. She states that he does not hear well and he has to walk with a walker.

## 2019-09-12 ENCOUNTER — Other Ambulatory Visit: Payer: Self-pay

## 2019-09-12 ENCOUNTER — Encounter: Payer: Self-pay | Admitting: Podiatrist

## 2019-09-12 ENCOUNTER — Ambulatory Visit: Payer: Medicare Other | Admitting: Podiatrist

## 2019-09-12 VITALS — BP 106/71 | HR 74 | Temp 96.2°F | Resp 16

## 2019-09-12 DIAGNOSIS — L851 Acquired keratosis [keratoderma] palmaris et plantaris: Secondary | ICD-10-CM | POA: Diagnosis not present

## 2019-09-12 DIAGNOSIS — B351 Tinea unguium: Secondary | ICD-10-CM

## 2019-09-12 DIAGNOSIS — G629 Polyneuropathy, unspecified: Secondary | ICD-10-CM

## 2019-09-12 DIAGNOSIS — L859 Epidermal thickening, unspecified: Secondary | ICD-10-CM

## 2019-09-12 DIAGNOSIS — I739 Peripheral vascular disease, unspecified: Secondary | ICD-10-CM | POA: Diagnosis not present

## 2019-09-12 NOTE — Patient Instructions (Signed)
GENERAL FOOT HEALTH INFORMATION: ? ?Moisturize your feet regularly with a cream based lotion.  One's I recommend are Cetaphil (Cream) or Eucerin (Cream)- usually available in a tub or crock type of container.  Avoid applying the cream to the toe interspaces themselves to reduce the risk of a fungal infection between the toes.  After showering or bathing be sure to dry well between your toes.   ? ? ? ? ?

## 2019-09-12 NOTE — Progress Notes (Signed)
   Chief Complaint  Patient presents with  . Nail Problem    i have some toenails that are fungus looking  . Callouses    i have a spot on the ball of the left foot      HPI: Patient is 74 y.o. male who presents today for fungal toenails and a lesion on the bottom of the left foot.  Patient has tried no self treatment.  He states his feet stay purple and he isn't sure why that is.    Review of Systems  DATA OBTAINED: from patient  GENERAL: Feels well no fevers, no fatigue, no changes in appetite SKIN: No itching, no rashes, no open wounds EYES: No eye pain,no redness, no discharge EARS: No earache,no ringing of ears, NOSE: No congestion, no drainage, no bleeding  MOUTH/THROAT: No mouth pain, No sore throat, No difficulty chewing or swallowing  RESPIRATORY: No cough, no wheezing, no SOB CARDIAC: No chest pain,no heart palpitations, GI: No abdominal pain, No Nausea, no vomiting, no diarrhea, no heartburn or no reflux  GU: No dysuria, no increased frequency or urgency MUSCULOSKELETAL: No unrelieved bone/joint pain,  NEUROLOGIC: Awake, alert, appropriate to situation, No change in mental status. PSYCHIATRIC: No overt anxiety or sadness.No behavior issue.      Physical Exam  GENERAL APPEARANCE: Alert, conversant. Appropriately groomed. No acute distress.   VASCULAR: Pedal pulses palpable 2/4 DP and PT Left and 0/4 DP, 2/4 PT right..  Capillary refill time is less than 4 seconds to all digits,  Digital hair growth absent, generalized edema and purple discoloration of bilateral feet is noted with feet elevated and with standing. NEUROLOGIC: protective sensation decreased at 1/5 sites bilateral  to 5.07 monofilament.[place] touch is intact bilateral, vibratory sensation absent bilateral, achilles tendon reflex is absent bilateral.   MUSCULOSKELETAL: acceptable muscle strength, tone and stability bilateral.  Intrinsic muscluature intact bilateral.  Range of motion at ankle and first  MPJ is normal bilateral.   DERMATOLOGIC: skin is warm, supple, and dry.  No open lesions noted.  No interdigital maceration noted bilateral.  Patients toenails are elongated, thick, discolored and tender when touched.  Keratotic lesion present siub 4 left foot which is intractable in nature and has a normal base post debridement.      Assessment  Hyperkeratosis of skin  Dermatophytosis of nail  Acquired keratoderma  PVD (peripheral vascular disease) (HCC)  Peripheral neuropathy  Plan  Careful debridement of patients toenails and hyperkeratotic lesion carried out without complication.  Discussed the importance of regular foot checks especially due to his neuropathy and peripheral blood flow issues.  Recommend professional pallative care in 3 months or as needed.

## 2019-09-19 ENCOUNTER — Ambulatory Visit: Payer: Self-pay

## 2019-10-03 ENCOUNTER — Other Ambulatory Visit: Payer: Self-pay

## 2019-10-03 ENCOUNTER — Ambulatory Visit (INDEPENDENT_AMBULATORY_CARE_PROVIDER_SITE_OTHER): Payer: Medicare Other

## 2019-10-03 VITALS — BP 128/80 | HR 71 | Temp 98.2°F | Resp 16 | Ht 70.0 in | Wt 283.0 lb

## 2019-10-03 DIAGNOSIS — Z0000 Encounter for general adult medical examination without abnormal findings: Secondary | ICD-10-CM

## 2019-10-03 NOTE — Patient Instructions (Addendum)
[name] [name] , Thank you for taking time to come for your Medicare Wellness Visit. I appreciate your ongoing commitment to your health goals. Please review the following plan we discussed and let me know if I can assist you in the future.   Screening recommendations/referrals: Colorectal Screening: last done 01/20/2016  Vision and Dental Exams: Recommended annual ophthalmology exams for early detection of glaucoma and other disorders of the eye Recommended annual dental exams for proper oral hygiene  Vaccinations: Influenza vaccine: 03/08/2019 Pneumococcal vaccine: 07/03/2014; 03/08/2019 Tdap vaccine: 09/18/2018 Shingles vaccine: Please call your insurance company to determine your out of pocket expense for the Shingrix vaccine. You may receive this vaccine at your local pharmacy.  Advanced directives: Advance directives discussed with you today. I have provided a copy for you to complete at home and have notarized. Once this is complete please bring a copy in to our office so we can scan it into your chart.  Goals:  Recommend to drink at least 6-8 8oz glasses of water per day.  Recommend to exercise for at least 150 minutes per week.  Recommend to remove any items from the home that may cause slips or trips.  Recommend to decrease portion sizes by eating 3 small healthy meals and at least 2 healthy snacks per day.  Recommend to begin DASH diet as directed below  Recommend to continue efforts to reduce smoking habits until no longer smoking. Smoking Cessation literature is attached below.  Next appointment: Please schedule your Annual Wellness Visit with your Nurse Health Advisor in one year.  Preventive Care 73 Years and Older, Male Preventive care refers to lifestyle choices and visits with your health care provider that can promote health and wellness. What does preventive care include?  A yearly physical exam. This is also called an annual well check.  Dental exams once or twice a  year.  Routine eye exams. Ask your health care provider how often you should have your eyes checked.  Personal lifestyle choices, including:  Daily care of your teeth and gums.  Regular physical activity.  Eating a healthy diet.  Avoiding tobacco and drug use.  Limiting alcohol use.  Practicing safe sex.  Taking low doses of aspirin every day if recommended by your health care provider..  Taking vitamin and mineral supplements as recommended by your health care provider. What happens during an annual well check? The services and screenings done by your health care provider during your annual well check will depend on your age, overall health, lifestyle risk factors, and family history of disease. Counseling  Your health care provider may ask you questions about your:  Alcohol use.  Tobacco use.  Drug use.  Emotional well-being.  Home and relationship well-being.  Sexual activity.  Eating habits.  History of falls.  Memory and ability to understand (cognition).  Work and work Statistician. Screening  You may have the following tests or measurements:  Height, weight, and BMI.  Blood pressure.  Lipid and cholesterol levels. These may be checked every 5 years, or more frequently if you are over 81 years old.  Skin check.  Lung cancer screening. You may have this screening every year starting at age 30 if you have a 30-pack-year history of smoking and currently smoke or have quit within the past 15 years.  Fecal occult blood test (FOBT) of the stool. You may have this test every year starting at age 39.  Flexible sigmoidoscopy or colonoscopy. You may have a sigmoidoscopy every 5  years or a colonoscopy every 10 years starting at age 38.  Prostate cancer screening. Recommendations will vary depending on your family history and other risks.  Hepatitis C blood test.  Hepatitis B blood test.  Sexually transmitted disease (STD) testing.  Diabetes screening.  This is done by checking your blood sugar (glucose) after you have not eaten for a while (fasting). You may have this done every 1-3 years.  Abdominal aortic aneurysm (AAA) screening. You may need this if you are a current or former smoker.  Osteoporosis. You may be screened starting at age 12 if you are at high risk. Talk with your health care provider about your test results, treatment options, and if necessary, the need for more tests. Vaccines  Your health care provider may recommend certain vaccines, such as:  Influenza vaccine. This is recommended every year.  Tetanus, diphtheria, and acellular pertussis (Tdap, Td) vaccine. You may need a Td booster every 10 years.  Zoster vaccine. You may need this after age 5.  Pneumococcal 13-valent conjugate (PCV13) vaccine. One dose is recommended after age 40.  Pneumococcal polysaccharide (PPSV23) vaccine. One dose is recommended after age 7. Talk to your health care provider about which screenings and vaccines you need and how often you need them. This information is not intended to replace advice given to you by your health care provider. Make sure you discuss any questions you have with your health care provider. Document Released: 07/11/2015 Document Revised: 03/03/2016 Document Reviewed: 04/15/2015 Elsevier Interactive Patient Education  2017 [place] Prevention in the Home Falls can cause injuries. They can happen to people of all ages. There are many things you can do to make your home safe and to help prevent falls. What can I do on the outside of my home?  Regularly fix the edges of walkways and driveways and fix any cracks.  Remove anything that might make you trip as you walk through a door, such as a raised step or threshold.  Trim any bushes or trees on the path to your home.  Use bright outdoor lighting.  Clear any walking paths of anything that might make someone trip, such as rocks or tools.  Regularly  check to see if handrails are loose or broken. Make sure that both sides of any steps have handrails.  Any raised decks and porches should have guardrails on the edges.  Have any leaves, snow, or ice cleared regularly.  Use sand or salt on walking paths during winter.  Clean up any spills in your garage right away. This includes oil or grease spills. What can I do in the bathroom?  Use night lights.  Install grab bars by the toilet and in the tub and shower. Do not use towel bars as grab bars.  Use non-skid mats or decals in the tub or shower.  If you need to sit down in the shower, use a plastic, non-slip stool.  Keep the floor dry. Clean up any water that spills on the floor as soon as it happens.  Remove soap buildup in the tub or shower regularly.  Attach bath mats securely with double-sided non-slip rug tape.  Do not have throw rugs and other things on the floor that can make you trip. What can I do in the bedroom?  Use night lights.  Make sure that you have a light by your bed that is easy to reach.  Do not use any sheets or blankets that are too big for  your bed. They should not hang down onto the floor.  Have a firm chair that has side arms. You can use this for support while you get dressed.  Do not have throw rugs and other things on the floor that can make you trip. What can I do in the kitchen?  Clean up any spills right away.  Avoid walking on wet floors.  Keep items that you use a lot in easy-to-reach places.  If you need to reach something above you, use a strong step stool that has a grab bar.  Keep electrical cords out of the way.  Do not use floor polish or wax that makes floors slippery. If you must use wax, use non-skid floor wax.  Do not have throw rugs and other things on the floor that can make you trip. What can I do with my stairs?  Do not leave any items on the stairs.  Make sure that there are handrails on both sides of the stairs and  use them. Fix handrails that are broken or loose. Make sure that handrails are as long as the stairways.  Check any carpeting to make sure that it is firmly attached to the stairs. Fix any carpet that is loose or worn.  Avoid having throw rugs at the top or bottom of the stairs. If you do have throw rugs, attach them to the floor with carpet tape.  Make sure that you have a light switch at the top of the stairs and the bottom of the stairs. If you do not have them, ask someone to add them for you. What else can I do to help prevent falls?  Wear shoes that:  Do not have high heels.  Have rubber bottoms.  Are comfortable and fit you well.  Are closed at the toe. Do not wear sandals.  If you use a stepladder:  Make sure that it is fully opened. Do not climb a closed stepladder.  Make sure that both sides of the stepladder are locked into place.  Ask someone to hold it for you, if possible.  Clearly mark and make sure that you can see:  Any grab bars or handrails.  First and last steps.  Where the edge of each step is.  Use tools that help you move around (mobility aids) if they are needed. These include:  Canes.  Walkers.  Scooters.  Crutches.  Turn on the lights when you go into a dark area. Replace any light bulbs as soon as they burn out.  Set up your furniture so you have a clear path. Avoid moving your furniture around.  If any of your floors are uneven, fix them.  If there are any pets around you, be aware of where they are.  Review your medicines with your doctor. Some medicines can make you feel dizzy. This can increase your chance of falling. Ask your doctor what other things that you can do to help prevent falls. This information is not intended to replace advice given to you by your health care provider. Make sure you discuss any questions you have with your health care provider. Document Released: 04/10/2009 Document Revised: 11/20/2015 Document  Reviewed: 07/19/2014 Elsevier Interactive Patient Education  2017 [employer].

## 2019-10-03 NOTE — Progress Notes (Addendum)
Subjective:   [name] [name] is a 74 y.o. male who presents for Medicare Annual/Subsequent preventive examination.  Review of Systems:  Medicare Wellness Exam Cardiac Risk Factors include: advanced age (>68men, >34 women);dyslipidemia;male gender;obesity (BMI >30kg/m2);Other (see comment), Risk factor comments: Prediabetes Sleep Patterns: No issues with falling sleep; issues with getting up 3-4 times to void. Home Safety/Smoke Alarms: Feels safe in home; Smoke alarms in place. Living environment: 2-story home; Lives with wife. Good support system. Seat Belt Safety/Bike Helmet: Wears seat belt.    Objective:    Vitals: BP 128/80 (BP Location: Left Arm, Patient Position: Sitting, Cuff Size: Normal)   Pulse 71   Temp 98.2 F (36.8 C)   Resp 16   Ht 5\' 10"  (1.778 m)   Wt 283 lb (128.4 kg)   SpO2 96%   BMI 40.61 kg/m   Body mass index is 40.61 kg/m.  Advanced Directives 01/20/2016 09/18/2015 07/29/2015 05/14/2015 05/08/2015 05/30/2012 09/21/2011  Does Patient Have a Medical Advance Directive? No No No No No Patient does not have advance directive Patient does not have advance directive;Patient would not like information  Would patient like information on creating a medical advance directive? - No - patient declined information No - patient declined information No - patient declined information - - -  Pre-existing out of facility DNR order (yellow form or pink MOST form) - - - - - No -    Tobacco Social History   Tobacco Use  Smoking Status Former Smoker  . Quit date: 07/17/1974  . Years since quitting: 45.2  Smokeless Tobacco Former Engineer, structural given: No   Clinical Intake:  Pre-visit preparation completed: Yes  Pain : 0-10 Pain Score: 5  Pain Type: Chronic pain Pain Location: Other (Comment)(neck, back, bil legs) Pain Orientation: Right, Left, Upper Pain Descriptors / Indicators: Discomfort, Aching, Numbness, Throbbing Pain Onset: More than a month ago Pain  Frequency: Constant     Nutritional Risks: None Diabetes: No  How often do you need to have someone help you when you read instructions, pamphlets, or other written materials from your doctor or pharmacy?: 1 - Never  Interpreter Needed?: No  Information entered by :: Mayank Teuscher N. Lowell Guitar, LPN  Past Medical History:  Diagnosis Date  . Arthritis   . CAD (coronary artery disease)    cath 3/10: mLAD 30%, pRCA 30%, EF 55-60% Dr. Harrington Challenger cardiologist  most recent  stress stress done ~ 2 years ago with Dr. Harrington Challenger  . Complication of anesthesia    "he usually gets an ileus after back OR"   . Depression   . Dyspnea    -PFTs compeltely normal 03/20/08 including DLc0  . Environmental allergies    Dust, Smoke  . GERD (gastroesophageal reflux disease)   . HTN (hypertension)   . Hypothyroidism   . Low testosterone   . Observed sleep apnea    can't wear cpap  . Polycythemia   . PONV (postoperative nausea and vomiting)   . Pulmonary nodule 06/13/2012  . Rosacea   . Sleep apnea    Most recent sleep study 2010; records at [facility]   Past Surgical History:  Procedure Laterality Date  . BACK SURGERY  2005; 06/2006; 01/2007; 04/2009  . CARDIAC CATHETERIZATION  08/2008  . CATARACT EXTRACTION, BILATERAL  2010  . CERVICAL FUSION  04/2002   ?C3-4  . COLONOSCOPY  12/2005   normal screening study.   . ESOPHAGOGASTRODUODENOSCOPY  multiple   last  07/2012 GERD esophagitis, 48 Fr dilation  . ESOPHAGOGASTRODUODENOSCOPY N/A 09/19/2015   Procedure: ESOPHAGOGASTRODUODENOSCOPY (EGD);  Surgeon: [name], MD;  [facility] ENDOSCOPY;  Service: Endoscopy;  Laterality: N/A;  . KNEE ARTHROSCOPY WITH MEDIAL MENISECTOMY Right 05/14/2015   Procedure: RIGHT KNEE ARTHROSCOPY CHONDROPLASTY, PARTIAL  MEDIAL MENISECTOMY;  Surgeon: [name], MD;  Location: [place];  Service: Orthopedics;  Laterality: Right;  . left knee replacement    . left shoudler clean    . LUMBAR DISC SURGERY   06/2006   L3  . MENISCUS REPAIR  08/2008   left  . NECK SURGERY    . POSTERIOR FUSION LUMBAR SPINE  2005; 01/2007; 04/2009   L5; L3-4; L2-3  . PROSTATE SURGERY  2000  . SAVORY DILATION N/A 09/19/2015   Procedure: SAVORY DILATION;  Surgeon: [name], MD;  Location[facility] ENDOSCOPY;  Service: Endoscopy;  Laterality: N/A;  . SHOULDER OPEN ROTATOR CUFF REPAIR Left 1999   left  . TONSILLECTOMY  09/21/11  . TOTAL KNEE ARTHROPLASTY Left 01/2011   left  . TOTAL KNEE ARTHROPLASTY Right 08/08/2015   Procedure: RIGHT TOTAL KNEE ARTHROPLASTY;  Surgeon: [name], MD;  Location: [place];  Service: Orthopedics;  Laterality: Right;  . TRANSURETHRAL RESECTION OF PROSTATE  2006   followed by "surgery to get rid of clots"  . UVULOPALATOPHARYNGOPLASTY, TONSILLECTOMY AND SEPTOPLASTY  09/21/11   Deviated Septum   Family History  Problem Relation Age of Onset  . Breast cancer Mother        dx. mid-70s  . Colon polyps Mother        "69+ colon polyps per yr"  . Diabetes Mother   . Breast cancer Sister 5  . Other Sister        + MUTYH mutation  . Diabetes Sister        borderline  . Heart Problems Sister   . Breast cancer Sister        dx. 40s  . Heart attack Father   . Alzheimer's disease Father   . Lung cancer Paternal Uncle        x4  . Throat cancer Maternal Uncle        mother's maternal half-brother; dx 41s; +EtOH  . Liver cancer Maternal Uncle        mother's maternal half-brother; either liver cancer or cirrhosis  . Cirrhosis Maternal Uncle        +EtOH  . Stroke Paternal Grandmother   . Heart attack Paternal Grandfather   . Other Daughter        negative genetic testing in 2017  . Parkinson's disease Other   . Bipolar disorder Other   . Lung cancer Paternal Uncle        (x3)  . Lung cancer Paternal Uncle   . Alzheimer's disease Cousin        paternal 1st cousin, d. 53  . Heart attack Cousin   . Anesthesia problems Neg Hx    Social History   Socioeconomic History  .  Marital status: Married    Spouse name: Not on file  . Number of children: 3  . Years of education: Not on file  . Highest education level: Not on file  Occupational History  . Occupation: retired    Fish farm manager: [employer]  Tobacco Use  . Smoking status: Former Smoker    Quit date: 07/17/1974    Years since quitting: 45.2  . Smokeless tobacco: Former Network engineer  and Sexual Activity  . Alcohol use: No  . Drug use: No  . Sexual activity: Not on file  Other Topics Concern  . Not on file  Social History Narrative   Area, 3 children.   Retired from the city of [place]   5 caffeinated beverages daily   Social Determinants of Health   Financial Resource Strain:   . Difficulty of Paying Living Expenses:   Food Insecurity:   . Worried About Charity fundraiser in the Last Year:   . Arboriculturist in the Last Year:   Transportation Needs:   . Film/video editor (Medical):   [name] Lack of Transportation (Non-Medical):   Physical Activity:   . Days of Exercise per Week:   . Minutes of Exercise per Session:   Stress:   . Feeling of Stress :   Social Connections:   . Frequency of Communication with Friends and Family:   . Frequency of Social Gatherings with Friends and Family:   . Attends Religious Services:   . Active Member of Clubs or Organizations:   . Attends Archivist Meetings:   [name] Marital Status:     Outpatient Encounter Medications as of 10/03/2019  Medication Sig  . aspirin EC 81 MG tablet Take 1 tablet (81 mg total) by mouth 2 (two) times daily.  . cholecalciferol (VITAMIN D3) 25 MCG (1000 UNIT) tablet Take 2,000 Units by mouth daily.  . DULoxetine (CYMBALTA) 30 MG capsule Take 30 mg by mouth daily.  [name] gabapentin (NEURONTIN) 300 MG capsule TAKE 1 CAPSULE BY MOUTH TWICE PER DAY  . levothyroxine (SYNTHROID) 150 MCG tablet Take 1 tablet (150 mcg total) by mouth daily before breakfast.  . losartan (COZAAR) 50 MG tablet Take 50 mg by mouth daily.    . metoprolol succinate (TOPROL XL) 25 MG 24 hr tablet Take 1 tablet (25 mg total) by mouth daily.  [name] omeprazole (PRILOSEC) 40 MG capsule Take 40 mg by mouth daily.  . polyethylene glycol (MIRALAX) 17 g packet Take 17 g by mouth daily.  . simvastatin (ZOCOR) 20 MG tablet Take 1 tablet (20 mg total) by mouth daily at 6 PM. Please keep upcoming appt in December for future refills. Thank you  . UNABLE TO FIND Mega red 900mg  one per day  . [DISCONTINUED] DULoxetine (CYMBALTA) 30 MG capsule TAKE 1 CAPSULE BY MOUTH  DAILY  . [DISCONTINUED] losartan (COZAAR) 50 MG tablet TAKE 1 TABLET BY MOUTH  DAILY  . [DISCONTINUED] omeprazole (PRILOSEC) 40 MG capsule TAKE 1 CAPSULE BY MOUTH  DAILY 30 MINUTES BEFORE  BREAKFAST   No facility-administered encounter medications on file as of 10/03/2019.    Activities of Daily Living In your present state of health, do you have any difficulty performing the following activities: 10/03/2019  Hearing? Y  Vision? N  Difficulty concentrating or making decisions? N  Walking or climbing stairs? Y  Dressing or bathing? Y  Doing errands, shopping? Y  Preparing Food and eating ? N  Using the Toilet? N  In the past six months, have you accidently leaked urine? Y  Do you have problems with loss of bowel control? N  Managing your Medications? N  Managing your Finances? N  Housekeeping or managing your Housekeeping? N  Some recent data might be hidden    Patient Care Team: [name], MD as PCP - General (Internal Medicine) Fay Records, MD as PCP - Cardiology (Cardiology) Wyatt Portela, MD (Hematology and  Oncology) Irine Seal, MD (Urology) Kristeen Miss, MD (Neurosurgery) Marygrace Drought, MD as Consulting Physician (Ophthalmology) Bronson Ing, DPM (Podiatry) Gatha Mayer, MD as Consulting Physician (Gastroenterology)   Assessment:   This is a routine wellness examination for [place].  Exercise Activities and Dietary recommendations Current Exercise  Habits: The patient does not participate in regular exercise at present, Exercise limited by: cardiac condition(s);neurologic condition(s);orthopedic condition(s);psychological condition(s)  Goals    . Client understands the importance of follow-up with providers by attending scheduled visits    . Patient Stated     Wants to be able to do yard work again and to eliminate some of his body pain.       Fall Risk Fall Risk  10/03/2019 09/05/2019 03/08/2019 12/28/2018 09/18/2018  Falls in the past year? 1 - 1 1 0  Number falls in past yr: 1 1 1 1  0  Injury with Fall? 1 1 1  0 0  Comment refused ER - - - -  Risk for fall due to : History of fall(s);Impaired balance/gait;Orthopedic patient;Impaired mobility Impaired balance/gait;Orthopedic patient Impaired mobility;Impaired balance/gait - -  Follow up Falls evaluation completed;Education provided;Falls prevention discussed Falls evaluation completed Education provided;Falls evaluation completed;Falls prevention discussed - Falls evaluation completed   Is the patient's home free of loose throw rugs in walkways, pet beds, electrical cords, etc?   yes      Grab bars in the bathroom? yes      Handrails on the stairs?   yes      Adequate lighting?   yes  Depression Screen PHQ 2/9 Scores 09/05/2019 09/18/2018  PHQ - 2 Score 1 1  PHQ- 9 Score 11 3    Cognitive Function     6CIT Screen 10/03/2019  What Year? 0 points  What month? 0 points  What time? 0 points  Count back from 20 0 points  Months in reverse 0 points  Repeat phrase 0 points  Total Score 0    Immunization History  Administered Date(s) Administered  . Fluad Quad(high Dose 65+) 03/08/2019  . Influenza Split 03/28/2013  . Influenza, High Dose Seasonal PF 03/28/2018  . Influenza-Unspecified 03/29/2011  . PPD Test 08/14/2015  . Pneumococcal Conjugate-13 07/03/2014  . Pneumococcal Polysaccharide-23 02/19/2013, 03/08/2019  . Tdap 07/15/2008, 09/18/2018  . Zoster Recombinat (Shingrix)  02/23/2018, 05/03/2018    Screening Tests Health Maintenance  Topic Date Due  . INFLUENZA VACCINE  01/27/2020  . COLONOSCOPY  01/19/2026  . TETANUS/TDAP  09/17/2028  . Hepatitis C Screening  Completed  . PNA vac Low Risk Adult  Completed   Cancer Screenings: Lung: Low Dose CT Chest recommended if Age 48-80 years, 30 pack-year currently smoking OR have quit w/in 15years. Patient does not qualify. Colorectal: due 2027      Plan:    Reviewed health maintenance screenings with patient today and relevant education, vaccines, and/or referrals were provided.    Continue doing brain stimulating activities (puzzles, reading, adult coloring books, staying active) to keep memory sharp.    Continue to eat heart healthy diet (full of fruits, vegetables, whole grains, lean protein, water--limit salt, fat, and sugar intake) and increase physical activity as tolerated.  I have personally reviewed and noted the following in the patient's chart:   . Medical and social history . Use of alcohol, tobacco or illicit drugs  . Current medications and supplements . Functional ability and status . Nutritional status . Physical activity . Advanced directives . List of other physicians . Hospitalizations, surgeries,  and ER visits in previous 12 months . Vitals . Screenings to include cognitive, depression, and falls . Referrals and appointments  In addition, I have reviewed and discussed with patient certain preventive protocols, quality metrics, and best practice recommendations. A written personalized care plan for preventive services as well as general preventive health recommendations were provided to patient.     Sheral Flow, LPN  [id number] Nurse Health Advisor  Medical screening examination/treatment/procedure(s) were performed by non-physician practitioner and as supervising physician I was immediately available for consultation/collaboration. I agree with above. Scarlette Calico,  MD

## 2019-10-11 ENCOUNTER — Other Ambulatory Visit: Payer: Self-pay | Admitting: Internal Medicine

## 2019-10-11 DIAGNOSIS — E039 Hypothyroidism, unspecified: Secondary | ICD-10-CM

## 2019-10-25 ENCOUNTER — Other Ambulatory Visit: Payer: Self-pay | Admitting: Internal Medicine

## 2019-10-25 DIAGNOSIS — I77810 Thoracic aortic ectasia: Secondary | ICD-10-CM

## 2019-10-25 DIAGNOSIS — E785 Hyperlipidemia, unspecified: Secondary | ICD-10-CM

## 2020-02-26 ENCOUNTER — Encounter: Payer: Self-pay | Admitting: Internal Medicine

## 2020-02-26 ENCOUNTER — Ambulatory Visit (INDEPENDENT_AMBULATORY_CARE_PROVIDER_SITE_OTHER): Payer: Medicare Other | Admitting: Internal Medicine

## 2020-02-26 ENCOUNTER — Other Ambulatory Visit: Payer: Self-pay

## 2020-02-26 VITALS — BP 142/84 | HR 76 | Temp 98.8°F | Resp 16 | Ht 70.0 in | Wt 280.0 lb

## 2020-02-26 DIAGNOSIS — E871 Hypo-osmolality and hyponatremia: Secondary | ICD-10-CM

## 2020-02-26 DIAGNOSIS — H9313 Tinnitus, bilateral: Secondary | ICD-10-CM | POA: Diagnosis not present

## 2020-02-26 DIAGNOSIS — R7303 Prediabetes: Secondary | ICD-10-CM

## 2020-02-26 DIAGNOSIS — Z23 Encounter for immunization: Secondary | ICD-10-CM

## 2020-02-26 DIAGNOSIS — E039 Hypothyroidism, unspecified: Secondary | ICD-10-CM | POA: Diagnosis not present

## 2020-02-26 DIAGNOSIS — Z0000 Encounter for general adult medical examination without abnormal findings: Secondary | ICD-10-CM | POA: Diagnosis not present

## 2020-02-26 LAB — CORTISOL: Cortisol, Plasma: 8.4 ug/dL

## 2020-02-26 NOTE — Progress Notes (Signed)
Subjective:  Patient ID: [name] [name], male    DOB: 01-04-1946  Age: 74 y.o. MRN: [id number]  CC: Hypothyroidism  This visit occurred during the SARS-CoV-2 public health emergency.  Safety protocols were in place, including screening questions prior to the visit, additional usage of staff PPE, and extensive cleaning of exam room while observing appropriate contact time as indicated for disinfecting solutions.    HPI [name] [name] presents for f/up - He has his usual  complaints with ataxia, vertigo, dizziness, tinnitus, leg weakness, frequent falls, and ataxia.  He tells me he recently saw a neurosurgeon and was told that there is nothing that can be done surgically.  With respect to the ringing in the ears he wants to see an ENT doctor.  Outpatient Medications Prior to Visit  Medication Sig Dispense Refill  . aspirin EC 81 MG tablet Take 1 tablet (81 mg total) by mouth 2 (two) times daily.    . cholecalciferol (VITAMIN D3) 25 MCG (1000 UNIT) tablet Take 2,000 Units by mouth daily.    . DULoxetine (CYMBALTA) 30 MG capsule TAKE 1 CAPSULE BY MOUTH  DAILY 90 capsule 1  . gabapentin (NEURONTIN) 300 MG capsule TAKE 1 CAPSULE BY MOUTH TWICE PER DAY    . levothyroxine (SYNTHROID) 150 MCG tablet TAKE 1 TABLET BY MOUTH  DAILY BEFORE BREAKFAST 90 tablet 1  . losartan (COZAAR) 50 MG tablet TAKE 1 TABLET BY MOUTH  DAILY 90 tablet 1  . metoprolol succinate (TOPROL XL) 25 MG 24 hr tablet Take 1 tablet (25 mg total) by mouth daily. 90 tablet 3  . omeprazole (PRILOSEC) 40 MG capsule TAKE 1 CAPSULE BY MOUTH  DAILY 30 MINUTES BEFORE  BREAKFAST 90 capsule 1  . polyethylene glycol (MIRALAX) 17 g packet Take 17 g by mouth daily.    . simvastatin (ZOCOR) 20 MG tablet TAKE 1 TABLET BY MOUTH  DAILY AT 6 PM. PLEASE KEEP  UPCOMING APPT IN DECEMBER  FOR FUTURE REFILLS. THANK  YOU 90 tablet 1  . UNABLE TO FIND Mega red 900mg  one per day     No facility-administered medications prior to visit.    ROS Review of  Systems  Constitutional: Negative for appetite change, chills, diaphoresis, fatigue and fever.  HENT: Positive for tinnitus. Negative for hearing loss.   Eyes: Negative for visual disturbance.  Respiratory: Negative.  Negative for cough, chest tightness, shortness of breath and wheezing.   Cardiovascular: Negative for chest pain, palpitations and leg swelling.  Gastrointestinal: Negative for abdominal pain, constipation, diarrhea, nausea and vomiting.  Endocrine: Negative.  Negative for cold intolerance and heat intolerance.  Genitourinary: Negative.  Negative for difficulty urinating.  Musculoskeletal: Positive for arthralgias, back pain, gait problem and neck pain. Negative for myalgias.  Skin: Negative for color change.  Neurological: Positive for weakness. Negative for dizziness and light-headedness.  Hematological: Negative for adenopathy. Does not bruise/bleed easily.  Psychiatric/Behavioral: Negative.     Objective:  BP (!) 142/84   Pulse 76   Temp 98.8 F (37.1 C) (Oral)   Resp 16   Ht 5\' 10"  (1.778 m)   Wt 280 lb (127 kg)   SpO2 95%   BMI 40.18 kg/m   BP Readings from Last 3 Encounters:  02/26/20 (!) 142/84  10/03/19 128/80  09/12/19 106/71    Wt Readings from Last 3 Encounters:  02/26/20 280 lb (127 kg)  10/03/19 283 lb (128.4 kg)  09/05/19 283 lb (128.4 kg)    Physical Exam Vitals  reviewed.  Constitutional:      General: He is not in acute distress.    Appearance: He is obese. He is ill-appearing. He is not toxic-appearing or diaphoretic.  HENT:     Nose: Nose normal.     Mouth/Throat:     Mouth: Mucous membranes are moist.  Eyes:     General: No scleral icterus.    Conjunctiva/sclera: Conjunctivae normal.  Cardiovascular:     Rate and Rhythm: Normal rate and regular rhythm.     Heart sounds: No murmur heard.   Pulmonary:     Effort: Pulmonary effort is normal.     Breath sounds: No stridor. No wheezing, rhonchi or rales.  Abdominal:     General:  Abdomen is protuberant. Bowel sounds are normal. There is no distension.     Palpations: Abdomen is soft. There is no hepatomegaly, splenomegaly or mass.     Tenderness: There is no abdominal tenderness.  Musculoskeletal:        General: Normal range of motion.     Cervical back: Neck supple.     Right lower leg: Edema (trace) present.     Left lower leg: Edema (trace) present.  Lymphadenopathy:     Cervical: No cervical adenopathy.  Skin:    General: Skin is warm and dry.  Neurological:     Mental Status: He is alert. Mental status is at baseline.     Motor: Weakness present.     Coordination: Coordination abnormal.  Psychiatric:        Mood and Affect: Mood normal.        Behavior: Behavior normal.     Lab Results  Component Value Date   WBC 5.1 09/05/2019   HGB 14.5 09/05/2019   HCT 44.1 09/05/2019   PLT 208.0 09/05/2019   GLUCOSE 99 02/26/2020   CHOL 103 09/05/2019   TRIG 128.0 09/05/2019   HDL 29.20 (L) 09/05/2019   LDLCALC 49 09/05/2019   ALT 24 09/05/2019   AST 25 09/05/2019   NA 137 02/26/2020   K 4.6 02/26/2020   CL 103 02/26/2020   CREATININE 0.87 02/26/2020   BUN 17 02/26/2020   CO2 27 02/26/2020   TSH 0.63 02/26/2020   PSA 0.28 03/08/2019   INR 1.03 07/29/2015   HGBA1C 6.2 (H) 02/26/2020    MR Cervical Spine Wo Contrast  Result Date: 02/04/2019  [facility] [address], [place], [place] [address] [phone] NEUROIMAGING REPORT STUDY DATE: 02/02/2019 PATIENT NAME: [name] [name] DOB: [birth year] MRN: [id number] EXAM: MRI of the cervical spine ORDERING CLINICIAN: Antony Contras, MD CLINICAL HISTORY: 74 year old man with cervical radiculopathy COMPARISON FILMS: MRI 02/22/2018 TECHNIQUE: MRI of the cervical spine was obtained utilizing 3 mm sagittal slices from the posterior fossa down to the T3-4 level with T1, T2 and inversion recovery views. In addition 4 mm axial slices from B7-0 down to T1-2 level were included with T2 and gradient  echo views. CONTRAST: None IMAGING SITE: [facility], [place], [place], [place] FINDINGS: :  On sagittal images, the spine is imaged from above the cervicomedullary junction to T2.   The spinal cord is of normal caliber.  There is T2 hyperintense signal within the cord adjacent to C3-C4 consistent with chronic mild low malacia, stable compared to the previous MRI.[name]  There is a solid fusion at C3-C4 associated with ACDF hardware.  Reduced disc height is noted at C4-C5, C6-C7 and C7-T1.  The vertebral bodies are normally aligned.  The vertebral bodies have normal signal.  The discs and interspaces were further evaluated on axial views from C2 to T1 as follows: C2-C3: There is a small right disc osteophyte complex causing mild right foraminal narrowing but no nerve root compression or spinal stenosis. C3-C4: This level is fused.  There is a right paramedian disc osteophyte complex and right facet hypertrophy causing moderate right foraminal narrowing.  There is also mild left foraminal narrowing.  No nerve root compression or spinal stenosis. C4-C5: There are disc osteophyte complexes and facet hypertrophy.  This causes mild spinal stenosis and moderate bilateral foraminal narrowing. C5-C6: There are disc osteophyte complexes, right greater than left causing mild spinal stenosis, severe right foraminal narrowing and moderate left foraminal narrowing. C6-C7: There is disc protrusion and uncovertebral spurring and ligamenta flava hypertrophy causing mild spinal stenosis and moderate bilateral foraminal narrowing. C7-T1: There is a right disc osteophyte complex, ligamenta flava hypertrophy and left greater than right facet hypertrophy.  There is no spinal stenosis but there is progressed moderate right and stable mild to moderate left foraminal narrowing. T1-T2: There is disc protrusion and ligamenta flava hypertrophy causing borderline spinal stenosis and mild to moderate foraminal narrowing. When  compared to the MRI dated 02/22/2018, changes at C7-T1 have progressed with more foraminal narrowing to the right on the current study.   This MRI of the cervical spine without contrast shows the following: 1.   At C3-C4, there is stable ACDF.  There is moderate right foraminal narrowing but no definite nerve root compression or spinal stenosis. 2.   At C4-C5, there are stable degenerative changes causing stable mild spinal stenosis and moderate foraminal narrowing but no definite nerve root compression.. 3.   At C5-C6, there are stable degenerative changes causing mild spinal stenosis and severe right foraminal narrowing with potential for right C6 nerve root compression 4.   At C6-C7, there are stable degenerative changes causing mild spinal stenosis and moderate foraminal narrowing but no definite nerve root compression. 5.   At C7-T1, there are progressive degenerative changes causing moderate right foraminal narrowing and moderate left foraminal narrowing.  There is no definite nerve root compression though there is more encroachment upon the right C8 nerve root compression than was present on the previous MRI. 6.   At T1-T2, there are degenerative changes causing borderline spinal stenosis but no nerve root compression. 7.   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 Felecia Shelling, MD, PhD, FAAN Certified in  Neuroimaging by [employer]    Assessment & Plan:   Derell was seen today for hypothyroidism.  Diagnoses and all orders for this visit:  Acquired hypothyroidism- His TSH is in the normal range.  He will stay on the current dose of levothyroxine. -     TSH; Future -     TSH  Chronic hyponatremia- His sodium level is normal now. -     BASIC METABOLIC PANEL WITH GFR; Future -     Cortisol; Future -     Sodium, urine, random; Future -     Sodium, urine, random -     Cortisol -     BASIC METABOLIC PANEL  WITH GFR  Prediabetes- His A1c is at 6.2%.  Medical therapy is not indicated. -     Hemoglobin A1c; Future -     Hemoglobin A1c  Tinnitus aurium, bilateral -     Ambulatory referral to ENT  Other orders -     Flu Vaccine QUAD 6+ mos PF IM (Fluarix Quad PF)   I am having Ramzy D. Perrier maintain his aspirin EC, gabapentin, polyethylene glycol, UNABLE TO FIND, metoprolol succinate, cholecalciferol, DULoxetine, losartan, omeprazole, levothyroxine, and simvastatin.  No orders of the defined types were placed in this encounter.    Follow-up: Return in about 6 months (around 08/25/2020).  Scarlette Calico, MD

## 2020-02-26 NOTE — Patient Instructions (Signed)
Hypothyroidism  Hypothyroidism is when the thyroid gland does not make enough of certain hormones (it is underactive). The thyroid gland is a small gland located in the lower front part of the neck, just in front of the windpipe (trachea). This gland makes hormones that help control how the body uses food for energy (metabolism) as well as how the heart and brain function. These hormones also play a role in keeping your bones strong. When the thyroid is underactive, it produces too little of the hormones thyroxine (T4) and triiodothyronine (T3). What are the causes? This condition may be caused by:  Hashimoto's disease. This is a disease in which the body's disease-fighting system (immune system) attacks the thyroid gland. This is the most common cause.  Viral infections.  Pregnancy.  Certain medicines.  Birth defects.  Past radiation treatments to the head or neck for cancer.  Past treatment with radioactive iodine.  Past exposure to radiation in the environment.  Past surgical removal of part or all of the thyroid.  Problems with a gland in the center of the brain (pituitary gland).  Lack of enough iodine in the diet. What increases the risk? You are more likely to develop this condition if:  You are male.  You have a family history of thyroid conditions.  You use a medicine called lithium.  You take medicines that affect the immune system (immunosuppressants). What are the signs or symptoms? Symptoms of this condition include:  Feeling as though you have no energy (lethargy).  Not being able to tolerate cold.  Weight gain that is not explained by a change in diet or exercise habits.  Lack of appetite.  Dry skin.  Coarse hair.  Menstrual irregularity.  Slowing of thought processes.  Constipation.  Sadness or depression. How is this diagnosed? This condition may be diagnosed based on:  Your symptoms, your medical history, and a physical exam.  Blood  tests. You may also have imaging tests, such as an ultrasound or MRI. How is this treated? This condition is treated with medicine that replaces the thyroid hormones that your body does not make. After you begin treatment, it may take several weeks for symptoms to go away. Follow these instructions at home:  Take over-the-counter and prescription medicines only as told by your health care provider.  If you start taking any new medicines, tell your health care provider.  Keep all follow-up visits as told by your health care provider. This is important. ? As your condition improves, your dosage of thyroid hormone medicine may change. ? You will need to have blood tests regularly so that your health care provider can monitor your condition. Contact a health care provider if:  Your symptoms do not get better with treatment.  You are taking thyroid replacement medicine and you: ? Sweat a lot. ? Have tremors. ? Feel anxious. ? Lose weight rapidly. ? Cannot tolerate heat. ? Have emotional swings. ? Have diarrhea. ? Feel weak. Get help right away if you have:  Chest pain.  An irregular heartbeat.  A rapid heartbeat.  Difficulty breathing. Summary  Hypothyroidism is when the thyroid gland does not make enough of certain hormones (it is underactive).  When the thyroid is underactive, it produces too little of the hormones thyroxine (T4) and triiodothyronine (T3).  The most common cause is Hashimoto's disease, a disease in which the body's disease-fighting system (immune system) attacks the thyroid gland. The condition can also be caused by viral infections, medicine, pregnancy, or past   radiation treatment to the head or neck.  Symptoms may include weight gain, dry skin, constipation, feeling as though you do not have energy, and not being able to tolerate cold.  This condition is treated with medicine to replace the thyroid hormones that your body does not make. This information  is not intended to replace advice given to you by your health care provider. Make sure you discuss any questions you have with your health care provider. Document Revised: 05/27/2017 Document Reviewed: 05/25/2017 Elsevier Patient Education  2020 [employer]  

## 2020-02-27 ENCOUNTER — Encounter: Payer: Self-pay | Admitting: Internal Medicine

## 2020-02-27 LAB — BASIC METABOLIC PANEL WITH GFR
BUN: 17 mg/dL (ref 7–25)
CO2: 27 mmol/L (ref 20–32)
Calcium: 9.6 mg/dL (ref 8.6–10.3)
Chloride: 103 mmol/L (ref 98–110)
Creat: 0.87 mg/dL (ref 0.70–1.18)
GFR, Est African American: 99 mL/min/{1.73_m2} (ref >=60)
GFR, Est Non African American: 86 mL/min/{1.73_m2} (ref >=60)
Glucose, Bld: 99 mg/dL (ref 65–99)
Potassium: 4.6 mmol/L (ref 3.5–5.3)
Sodium: 137 mmol/L (ref 135–146)

## 2020-02-27 LAB — HEMOGLOBIN A1C
Hgb A1c MFr Bld: 6.2 % of total Hgb — ABNORMAL HIGH (ref <5.7)
Mean Plasma Glucose: 131 (calc)
eAG (mmol/L): 7.3 (calc)

## 2020-02-27 LAB — SODIUM, URINE, RANDOM: Sodium, Ur: 110 mmol/L (ref 28–272)

## 2020-02-27 LAB — TSH: TSH: 0.63 mIU/L (ref 0.40–4.50)

## 2020-02-29 ENCOUNTER — Ambulatory Visit (INDEPENDENT_AMBULATORY_CARE_PROVIDER_SITE_OTHER): Payer: Medicare Other | Admitting: Otolaryngology

## 2020-02-29 NOTE — Assessment & Plan Note (Signed)
Exam completed Labs reviewed Vaccines reviewed and updated Cancer screenings are up-to-date Patient education material was given. 

## 2020-03-12 ENCOUNTER — Ambulatory Visit (INDEPENDENT_AMBULATORY_CARE_PROVIDER_SITE_OTHER): Payer: Medicare Other | Admitting: Otolaryngology

## 2020-03-12 ENCOUNTER — Other Ambulatory Visit: Payer: Self-pay

## 2020-03-12 ENCOUNTER — Encounter (INDEPENDENT_AMBULATORY_CARE_PROVIDER_SITE_OTHER): Payer: Self-pay | Admitting: Otolaryngology

## 2020-03-12 VITALS — Temp 97.2°F

## 2020-03-12 DIAGNOSIS — J310 Chronic rhinitis: Secondary | ICD-10-CM | POA: Diagnosis not present

## 2020-03-12 DIAGNOSIS — H903 Sensorineural hearing loss, bilateral: Secondary | ICD-10-CM

## 2020-03-12 DIAGNOSIS — H9313 Tinnitus, bilateral: Secondary | ICD-10-CM | POA: Diagnosis not present

## 2020-03-12 NOTE — Progress Notes (Signed)
HPI: [name] [name] is a 74 y.o. male who presents for evaluation of chronic tinnitus as well as chronic dizziness.  He also complains of sinus drainage which is very thick.  He has had this for a long time.  He has previously seen physical therapy for his chronic dizziness. He used to be a patient of Dr. Ernesto Rutherford and has had previous UPPP, septoplasty and turbinate reductions. He brings with him a hearing test from [facility] performed on 12/26/2019 that showed bilateral sensorineural hearing loss which is worse in the right ear.  SRT's were 55 dB on the right and 40 dB on the left.  He had a previous hearing test performed in 2019 that showed bilateral SNHL with SRT's of 40dB on the right and 30 dB on the left.  Past Medical History:  Diagnosis Date  . Arthritis   . CAD (coronary artery disease)    cath 3/10: mLAD 30%, pRCA 30%, EF 55-60% Dr. Harrington Challenger cardiologist  most recent  stress stress done ~ 2 years ago with Dr. Harrington Challenger  . Complication of anesthesia    "he usually gets an ileus after back OR"   . Depression   . Dyspnea    -PFTs compeltely normal 03/20/08 including DLc0  . Environmental allergies    Dust, Smoke  . GERD (gastroesophageal reflux disease)   . HTN (hypertension)   . Hypothyroidism   . Low testosterone   . Observed sleep apnea    can't wear cpap  . Polycythemia   . PONV (postoperative nausea and vomiting)   . Pulmonary nodule 06/13/2012  . Rosacea   . Sleep apnea    Most recent sleep study 2010; [facility]   Past Surgical History:  Procedure Laterality Date  . BACK SURGERY  2005; 06/2006; 01/2007; 04/2009  . CARDIAC CATHETERIZATION  08/2008  . CATARACT EXTRACTION, BILATERAL  2010  . CERVICAL FUSION  04/2002   ?C3-4  . COLONOSCOPY  12/2005   normal screening study.   . ESOPHAGOGASTRODUODENOSCOPY  multiple   last 07/2012 GERD esophagitis, 48 Fr dilation  . ESOPHAGOGASTRODUODENOSCOPY N/A 09/19/2015   Procedure: ESOPHAGOGASTRODUODENOSCOPY (EGD);   Surgeon: [name], MD;  Location: [facility] ENDOSCOPY;  Service: Endoscopy;  Laterality: N/A;  . KNEE ARTHROSCOPY WITH MEDIAL MENISECTOMY Right 05/14/2015   Procedure: RIGHT KNEE ARTHROSCOPY CHONDROPLASTY, PARTIAL  MEDIAL MENISECTOMY;  Surgeon: [name], MD;  Location: [place];  Service: Orthopedics;  Laterality: Right;  . left knee replacement    . left shoudler clean    . LUMBAR DISC SURGERY  06/2006   L3  . MENISCUS REPAIR  08/2008   left  . NECK SURGERY    . POSTERIOR FUSION LUMBAR SPINE  2005; 01/2007; 04/2009   L5; L3-4; L2-3  . PROSTATE SURGERY  2000  . SAVORY DILATION N/A 09/19/2015   Procedure: SAVORY DILATION;  Surgeon: [name], MD;  Location: [facility] ENDOSCOPY;  Service: Endoscopy;  Laterality: N/A;  . SHOULDER OPEN ROTATOR CUFF REPAIR Left 1999   left  . TONSILLECTOMY  09/21/11  . TOTAL KNEE ARTHROPLASTY Left 01/2011   left  . TOTAL KNEE ARTHROPLASTY Right 08/08/2015   Procedure: RIGHT TOTAL KNEE ARTHROPLASTY;  Surgeon: [name], MD;  Location: [place];  Service: Orthopedics;  Laterality: Right;  . TRANSURETHRAL RESECTION OF PROSTATE  2006   followed by "surgery to get rid of clots"  . UVULOPALATOPHARYNGOPLASTY, TONSILLECTOMY AND SEPTOPLASTY  09/21/11   Deviated Septum   Social History  Socioeconomic History  . Marital status: Married    Spouse name: Not on file  . Number of children: 3  . Years of education: Not on file  . Highest education level: Not on file  Occupational History  . Occupation: retired    Fish farm manager: [employer]  Tobacco Use  . Smoking status: Former Smoker    Packs/day: 1.00    Years: 16.00    Pack years: 16.00    Quit date: 07/17/1974    Years since quitting: 45.6  . Smokeless tobacco: Former Network engineer  . Vaping Use: Never used  Substance and Sexual Activity  . Alcohol use: No  . Drug use: No  . Sexual activity: Not on file  Other Topics Concern  . Not on file  Social History Narrative   Area, 3  children.   Retired from the city of [place]   5 caffeinated beverages daily   Social Determinants of Health   Financial Resource Strain:   . Difficulty of Paying Living Expenses: Not on file  Food Insecurity:   . Worried About Charity fundraiser in the Last Year: Not on file  . Ran Out of Food in the Last Year: Not on file  Transportation Needs:   . Lack of Transportation (Medical): Not on file  . Lack of Transportation (Non-Medical): Not on file  Physical Activity:   . Days of Exercise per Week: Not on file  . Minutes of Exercise per Session: Not on file  Stress:   . Feeling of Stress : Not on file  Social Connections:   . Frequency of Communication with Friends and Family: Not on file  . Frequency of Social Gatherings with Friends and Family: Not on file  . Attends Religious Services: Not on file  . Active Member of Clubs or Organizations: Not on file  . Attends Archivist Meetings: Not on file  . Marital Status: Not on file   Family History  Problem Relation Age of Onset  . Breast cancer Mother        dx. mid-70s  . Colon polyps Mother        "73+ colon polyps per yr"  . Diabetes Mother   . Breast cancer Sister 57  . Other Sister        + MUTYH mutation  . Diabetes Sister        borderline  . Heart Problems Sister   . Breast cancer Sister        dx. 34s  . Heart attack Father   . Alzheimer's disease Father   . Lung cancer Paternal Uncle        x4  . Throat cancer Maternal Uncle        mother's maternal half-brother; dx 23s; +EtOH  . Liver cancer Maternal Uncle        mother's maternal half-brother; either liver cancer or cirrhosis  . Cirrhosis Maternal Uncle        +EtOH  . Stroke Paternal Grandmother   . Heart attack Paternal Grandfather   . Other Daughter        negative genetic testing in 2017  . Parkinson's disease Other   . Bipolar disorder Other   . Lung cancer Paternal Uncle        (x3)  . Lung cancer Paternal Uncle   . Alzheimer's  disease Cousin        paternal 1st cousin, d. 34  . Heart attack Cousin   .  Anesthesia problems Neg Hx    Allergies  Allergen Reactions  . Penicillins Hives and Other (See Comments)    "I break out in welts" Has patient had a PCN reaction causing immediate rash, facial/tongue/throat swelling, SOB or lightheadedness with hypotension: No Has patient had a PCN reaction causing severe rash involving mucus membranes or skin necrosis: No Has patient had a PCN reaction that required hospitalization No Has patient had a PCN reaction occurring within the last 10 years: No If all of the above answers are "NO", then may proceed with Cephalosporin use.  . Codeine Nausea And Vomiting  . Sulfonamide Derivatives Nausea And Vomiting   Prior to Admission medications   Medication Sig Start Date End Date Taking? Authorizing Provider  aspirin EC 81 MG tablet Take 1 tablet (81 mg total) by mouth 2 (two) times daily. 10/03/15  Yes Thurnell Lose, MD  cholecalciferol (VITAMIN D3) 25 MCG (1000 UNIT) tablet Take 2,000 Units by mouth daily.   Yes [provider]  DULoxetine (CYMBALTA) 30 MG capsule TAKE 1 CAPSULE BY MOUTH  DAILY 10/11/19  Yes [name], MD  gabapentin (NEURONTIN) 300 MG capsule TAKE 1 CAPSULE BY MOUTH TWICE PER DAY 09/04/18  Yes [provider]  levothyroxine (SYNTHROID) 150 MCG tablet TAKE 1 TABLET BY MOUTH  DAILY BEFORE BREAKFAST 10/11/19  Yes [name], MD  losartan (COZAAR) 50 MG tablet TAKE 1 TABLET BY MOUTH  DAILY 10/11/19  Yes [name], MD  metoprolol succinate (TOPROL XL) 25 MG 24 hr tablet Take 1 tablet (25 mg total) by mouth daily. 07/25/19  Yes Fay Records, MD  omeprazole (PRILOSEC) 40 MG capsule TAKE 1 CAPSULE BY MOUTH  DAILY 30 MINUTES BEFORE  BREAKFAST 10/11/19  Yes [name], MD  polyethylene glycol (MIRALAX) 17 g packet Take 17 g by mouth daily.   Yes [provider]  simvastatin (ZOCOR) 20 MG tablet TAKE 1 TABLET BY MOUTH  DAILY AT 6  PM. PLEASE KEEP  UPCOMING APPT IN DECEMBER  FOR FUTURE REFILLS. [place]  YOU 10/25/19  Yes [name], MD  UNABLE TO FIND Mega red 900mg  one per day   Yes [provider]  DULoxetine (CYMBALTA) 30 MG capsule TAKE 1 CAPSULE BY MOUTH  DAILY 01/31/19   [name], MD  losartan (COZAAR) 50 MG tablet TAKE 1 TABLET BY MOUTH  DAILY 01/31/19   [name], MD  omeprazole (PRILOSEC) 40 MG capsule TAKE 1 CAPSULE BY MOUTH  DAILY 30 MINUTES BEFORE  BREAKFAST 01/31/19   [name], MD     Positive ROS: Otherwise negative  All other systems have been reviewed and were otherwise negative with the exception of those mentioned in the HPI and as above.  Physical Exam: Constitutional: Alert, well-appearing, no acute distress Ears: External ears without lesions or tenderness. Ear canals are clear bilaterally with intact, clear TMs bilaterally. Nasal: External nose without lesions. Septum midline with clear nasal passages bilaterally.  Both middle meatus regions were clear with no evidence of infection or mucopurulent discharge..  Oral: Lips and gums without lesions. Tongue and palate mucosa without lesions. Posterior oropharynx clear.  Posterior oral cavity is clear with no significant thick drainage noted. Neck: No palpable adenopathy or masses Respiratory: Breathing comfortably  Skin: No facial/neck lesions or rash noted.  Procedures  Assessment: Tinnitus secondary to bilateral SNHL which is worse in the upper frequencies.  This has gradually gotten worse over the past 2 years.  Dizziness Chronic rhinitis with postnasal drainage.  Plan: Concerning the drainage recommended use of Nasacort and/or saline nasal irrigations. For the dizziness there is no medical therapy for this and would recommend physical therapy for vestibular rehab which he has done in the past. For the tinnitus I discussed with him concerning use of hearing aids which he is obtaining as well as using masking  noise.  Radene Journey, MD

## 2020-03-14 ENCOUNTER — Encounter (INDEPENDENT_AMBULATORY_CARE_PROVIDER_SITE_OTHER): Payer: Self-pay

## 2020-05-11 ENCOUNTER — Other Ambulatory Visit: Payer: Self-pay | Admitting: Internal Medicine

## 2020-05-11 DIAGNOSIS — E785 Hyperlipidemia, unspecified: Secondary | ICD-10-CM

## 2020-05-11 DIAGNOSIS — I77810 Thoracic aortic ectasia: Secondary | ICD-10-CM

## 2020-05-11 DIAGNOSIS — E039 Hypothyroidism, unspecified: Secondary | ICD-10-CM

## 2020-05-12 ENCOUNTER — Encounter: Payer: Self-pay | Admitting: Internal Medicine

## 2020-05-12 ENCOUNTER — Ambulatory Visit: Payer: Medicare Other | Admitting: Internal Medicine

## 2020-05-12 ENCOUNTER — Other Ambulatory Visit: Payer: Self-pay

## 2020-05-12 VITALS — BP 150/80 | HR 83 | Ht 70.0 in | Wt 283.4 lb

## 2020-05-12 DIAGNOSIS — M7989 Other specified soft tissue disorders: Secondary | ICD-10-CM

## 2020-05-12 DIAGNOSIS — I10 Essential (primary) hypertension: Secondary | ICD-10-CM

## 2020-05-12 NOTE — Progress Notes (Signed)
Cardiology Office Note   Date:  05/12/2020   ID:  [name] [name], DOB 01-03-1946, MRN [id number]  PCP:  [name], MD  Cardiologist:   [name], MD   F/U OF CAD    History of Present Illness: [name] [name] is a 74 y.o. 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 aspirin EC 81 MG tablet Take 1 tablet (81 mg total) by mouth 2 (two) times daily.  . cholecalciferol (VITAMIN D3) 25 MCG (1000 UNIT) tablet Take 2,000 Units by mouth daily.  . DULoxetine (CYMBALTA) 30 MG capsule TAKE 1 CAPSULE BY MOUTH  DAILY  . gabapentin (NEURONTIN) 300 MG capsule TAKE 1 CAPSULE BY MOUTH TWICE PER DAY  . levothyroxine (SYNTHROID) 150 MCG tablet TAKE 1 TABLET BY MOUTH  DAILY BEFORE BREAKFAST  . losartan (COZAAR) 50 MG tablet TAKE 1 TABLET BY MOUTH  DAILY  . metoprolol succinate (TOPROL XL) 25 MG 24 hr tablet Take 1 tablet (25 mg total) by mouth daily.  [name] omeprazole (PRILOSEC) 40 MG capsule TAKE 1 CAPSULE BY MOUTH  DAILY 30 MINUTES BEFORE  BREAKFAST  . polyethylene glycol (MIRALAX) 17 g packet Take 17 g by mouth daily.  . simvastatin (ZOCOR) 20 MG tablet TAKE 1 TABLET BY MOUTH  DAILY AT 6 PM.  . UNABLE TO FIND Mega red 900mg  one per day     Allergies:   Penicillins, Codeine, and Sulfonamide derivatives   Past Medical History:  Diagnosis Date  . Arthritis   . CAD (coronary artery disease)    cath 3/10: mLAD 30%, pRCA 30%, EF 55-60% Dr. Harrington Challenger cardiologist  most recent  stress stress done ~ 2 years ago with Dr. Harrington Challenger  . Complication of anesthesia    "he usually gets an ileus after back OR"   . Depression   . Dyspnea    -PFTs  compeltely normal 03/20/08 including DLc0  . Environmental allergies    Dust, Smoke  . GERD (gastroesophageal reflux disease)   . HTN (hypertension)   . Hypothyroidism   . Low testosterone   . Observed sleep apnea    can't wear cpap  . Polycythemia   . PONV (postoperative nausea and vomiting)   . Pulmonary nodule 06/13/2012  . Rosacea   . Sleep apnea    Most recent sleep study 2010; records [facility]    Past Surgical History:  Procedure Laterality Date  . BACK SURGERY  2005; 06/2006; 01/2007; 04/2009  . CARDIAC CATHETERIZATION  08/2008  . CATARACT EXTRACTION, BILATERAL  2010  . CERVICAL FUSION  04/2002   ?C3-4  . COLONOSCOPY  12/2005   normal screening study.   . ESOPHAGOGASTRODUODENOSCOPY  multiple   last 07/2012 GERD esophagitis, 48 Fr dilation  . ESOPHAGOGASTRODUODENOSCOPY N/A 09/19/2015   Procedure: ESOPHAGOGASTRODUODENOSCOPY (EGD);  Surgeon: [name], MD;  Location[facility] ENDOSCOPY;  Service: Endoscopy;  Laterality: N/A;  . KNEE ARTHROSCOPY WITH MEDIAL MENISECTOMY Right 05/14/2015   Procedure: RIGHT KNEE ARTHROSCOPY CHONDROPLASTY,  PARTIAL  MEDIAL MENISECTOMY;  Surgeon: [name], MD;  Location: [place];  Service: Orthopedics;  Laterality: Right;  . left knee replacement    . left shoudler clean    . LUMBAR DISC SURGERY  06/2006   L3  . MENISCUS REPAIR  08/2008   left  . NECK SURGERY    . POSTERIOR FUSION LUMBAR SPINE  2005; 01/2007; 04/2009   L5; L3-4; L2-3  . PROSTATE SURGERY  2000  . SAVORY DILATION N/A 09/19/2015   Procedure: SAVORY DILATION;  Surgeon: [name], MD;  Location: [facility] ENDOSCOPY;  Service: Endoscopy;  Laterality: N/A;  . SHOULDER OPEN ROTATOR CUFF REPAIR Left 1999   left  . TONSILLECTOMY  09/21/11  . TOTAL KNEE ARTHROPLASTY Left 01/2011   left  . TOTAL KNEE ARTHROPLASTY Right 08/08/2015   Procedure: RIGHT TOTAL KNEE ARTHROPLASTY;  Surgeon: [name], MD;  Location: [place];  Service: Orthopedics;  Laterality:  Right;  . TRANSURETHRAL RESECTION OF PROSTATE  2006   followed by "surgery to get rid of clots"  . UVULOPALATOPHARYNGOPLASTY, TONSILLECTOMY AND SEPTOPLASTY  09/21/11   Deviated Septum     Social History:  [name] patient  reports that he quit smoking about 45 years ago. He has a 16.00 pack-year smoking history. He has quit using smokeless tobacco. He reports that he does not drink alcohol and does not use drugs.   Family History:  [name] patient's family history includes Alzheimer's disease in his cousin and father; Bipolar disorder in an other family member; Breast cancer in his mother and sister; Breast cancer (age of onset: 27) in his sister; Cirrhosis in his maternal uncle; Colon polyps in his mother; Diabetes in his mother and sister; Heart Problems in his sister; Heart attack in his cousin, father, and paternal grandfather; Liver cancer in his maternal uncle; Lung cancer in his paternal uncle, paternal uncle, and paternal uncle; Other in his daughter and sister; Parkinson's disease in an other family member; Stroke in his paternal grandmother; Throat cancer in his maternal uncle.    ROS:  Please see [name] history of present illness. All other systems are reviewed and  Negative to [name] above problem except as noted.    PHYSICAL EXAM: VS:  BP (!) 150/80   Pulse 83   Ht 5\' 10"  (1.778 m)   Wt 283 lb 6.4 oz (128.5 kg)   SpO2 94%   BMI 40.66 kg/m   GEN: Morbidly obese 75 yo in no acute distress  HEENT: normal  Neck: JVP is normal  No carotid bruits Cardiac: RRR; grade 1/6 to 2/6 systolic murmur left lower sternal border rubs, or gallops,  1+  edema  Respiratory:  clear to auscultation bilaterally, normal work of breathing GI: Obese.  Distended.  Nontender. MS: No deformity   Deferred    Skin: warm and dry, Feet calves are red Neuro:  Strength and sensation are intact Psych: euthymic mood, full affect   EKG:  EKG is not ordered  Lipid Panel    Component Value Date/Time   CHOL 103  09/05/2019 1051   TRIG 128.0 09/05/2019 1051   HDL 29.20 (L) 09/05/2019 1051   CHOLHDL 4 09/05/2019 1051   VLDL 25.6 09/05/2019 1051   LDLCALC 49 09/05/2019 1051      Wt Readings from Last 3 Encounters:  05/12/20 283 lb 6.4 oz (128.5 kg)  02/26/20 280 lb (127 kg)  10/03/19 283 lb (128.4 kg)      ASSESSMENT AND PLAN: 1  history of dyspnea.  [name] with edema in legs  Will get labs Consider diuretic  2 CAD.  Nonobstructive..  Chest pain is atypical   I do not think angina     2  Aorta  Aorta is 4.1 on CT back in June.  Will follow up next fall  3  Lipids.  Keep on statin.    4  HTN   BP is elevated today  Will need to follow     Current medicines are reviewed at length with [name] patient today.  [name] patient does not have concerns regarding medicines.  Signed, [name], MD  05/12/2020 10:27 AM    [address], [place], [place]  [address] Phone: [phone]; Fax: [phone]

## 2020-05-12 NOTE — Patient Instructions (Signed)
Medication Instructions:  No changes today *If you need a refill on your cardiac medications before your next appointment, please call your pharmacy*   Lab Work: Today: cbc, bmet, bnp If you have labs (blood work) drawn today and your tests are completely normal, you will receive your results only by: [name] MyChart Message (if you have MyChart) OR . A paper copy in the mail If you have any lab test that is abnormal or we need to change your treatment, we will call you to review the results.   Testing/Procedures: none   Follow-Up: At [facility], you and your health needs are our priority.  As part of our continuing mission to provide you with exceptional heart care, we have created designated Provider Care Teams.  These Care Teams include your primary Cardiologist (physician) and Advanced Practice Providers (APPs -  Physician Assistants and Nurse Practitioners) who all work together to provide you with the care you need, when you need it.   Your next appointment:   3 month(s)  The format for your next appointment:   In Person  Provider:   You may see [name], MD or one of the following Advanced Practice Providers on your designated Care Team:    Richardson Dopp, PA-C  Robbie Lis, [place]   Other Instructions

## 2020-05-13 LAB — BASIC METABOLIC PANEL
BUN/Creatinine Ratio: 17 (ref 10–24)
BUN: 14 mg/dL (ref 8–27)
CO2: 23 mmol/L (ref 20–29)
Calcium: 9.8 mg/dL (ref 8.6–10.2)
Chloride: 97 mmol/L (ref 96–106)
Creatinine, Ser: 0.84 mg/dL (ref 0.76–1.27)
GFR calc Af Amer: 100 mL/min/{1.73_m2} (ref >59)
GFR calc non Af Amer: 87 mL/min/{1.73_m2} (ref >59)
Glucose: 112 mg/dL — ABNORMAL HIGH (ref 65–99)
Potassium: 4.5 mmol/L (ref 3.5–5.2)
Sodium: 135 mmol/L (ref 134–144)

## 2020-05-13 LAB — PRO B NATRIURETIC PEPTIDE: NT-Pro BNP: 41 pg/mL (ref 0–376)

## 2020-05-13 LAB — CBC
Hematocrit: 43.4 % (ref 37.5–51.0)
Hemoglobin: 14.7 g/dL (ref 13.0–17.7)
MCH: 30.1 pg (ref 26.6–33.0)
MCHC: 33.9 g/dL (ref 31.5–35.7)
MCV: 89 fL (ref 79–97)
Platelets: 211 10*3/uL (ref 150–450)
RBC: 4.88 x10E6/uL (ref 4.14–5.80)
RDW: 13.2 % (ref 11.6–15.4)
WBC: 5.8 10*3/uL (ref 3.4–10.8)

## 2020-05-19 ENCOUNTER — Telehealth: Payer: Self-pay | Admitting: *Deleted

## 2020-05-19 DIAGNOSIS — M7989 Other specified soft tissue disorders: Secondary | ICD-10-CM

## 2020-05-19 MED ORDER — POTASSIUM CHLORIDE CRYS ER 20 MEQ PO TBCR: EXTENDED_RELEASE_TABLET | ORAL | 3 refills | Status: DC

## 2020-05-19 MED ORDER — FUROSEMIDE 40 MG PO TABS: ORAL_TABLET | ORAL | 3 refills | Status: DC

## 2020-05-19 NOTE — Telephone Encounter (Signed)
Patient's wife notified.  Patient will take Lasix and potassium 2 days per week.  Wife aware patient should spread out over the week.  She requests prescription be sent to [facility].  Wife states it may take a week for prescription to arrive.  Patient will start medications on November 30,2021 and come in for lab work on December 14,2021

## 2020-05-19 NOTE — Telephone Encounter (Signed)
-----   Message from Nuala Alpha, LPN sent at 15/94/5859  8:52 AM EST -----  ----- Message ----- From: Fay Records, MD Sent: 05/15/2020   5:39 PM EST To: [facility] Triage  CBC is normal  Electrolytes are normal Kidney function is normal Fluid overall appears OK WIth edema would try Lasix 40mg  with 20 KCL a couple timesrp per week   Check BMET in 2 wks

## 2020-06-10 ENCOUNTER — Other Ambulatory Visit: Payer: Self-pay

## 2020-06-10 ENCOUNTER — Other Ambulatory Visit: Payer: Medicare Other | Admitting: *Deleted

## 2020-06-10 DIAGNOSIS — M7989 Other specified soft tissue disorders: Secondary | ICD-10-CM

## 2020-06-10 LAB — BASIC METABOLIC PANEL
BUN/Creatinine Ratio: 13 (ref 10–24)
BUN: 11 mg/dL (ref 8–27)
CO2: 26 mmol/L (ref 20–29)
Calcium: 9.9 mg/dL (ref 8.6–10.2)
Chloride: 99 mmol/L (ref 96–106)
Creatinine, Ser: 0.84 mg/dL (ref 0.76–1.27)
GFR calc Af Amer: 100 mL/min/{1.73_m2} (ref >59)
GFR calc non Af Amer: 87 mL/min/{1.73_m2} (ref >59)
Glucose: 110 mg/dL — ABNORMAL HIGH (ref 65–99)
Potassium: 4.6 mmol/L (ref 3.5–5.2)
Sodium: 138 mmol/L (ref 134–144)

## 2020-07-31 ENCOUNTER — Telehealth: Payer: Self-pay | Admitting: Internal Medicine

## 2020-07-31 NOTE — Telephone Encounter (Signed)
  [facility] nurse making house calls, called to report patient was having episodes of dizziness  Called patient to offer appointment, he declined at this time, states he is going to follow up on issue with Cardiology

## 2020-08-01 ENCOUNTER — Other Ambulatory Visit: Payer: Self-pay

## 2020-08-01 ENCOUNTER — Ambulatory Visit: Payer: Medicare Other | Admitting: Podiatry

## 2020-08-01 ENCOUNTER — Encounter: Payer: Self-pay | Admitting: Podiatry

## 2020-08-01 DIAGNOSIS — G629 Polyneuropathy, unspecified: Secondary | ICD-10-CM

## 2020-08-01 DIAGNOSIS — M79675 Pain in left toe(s): Secondary | ICD-10-CM | POA: Diagnosis not present

## 2020-08-01 DIAGNOSIS — I739 Peripheral vascular disease, unspecified: Secondary | ICD-10-CM

## 2020-08-01 DIAGNOSIS — L84 Corns and callosities: Secondary | ICD-10-CM | POA: Diagnosis not present

## 2020-08-01 DIAGNOSIS — M79674 Pain in right toe(s): Secondary | ICD-10-CM | POA: Diagnosis not present

## 2020-08-01 DIAGNOSIS — B351 Tinea unguium: Secondary | ICD-10-CM | POA: Diagnosis not present

## 2020-08-07 NOTE — Progress Notes (Signed)
Subjective:  Patient ID: [name] [name], male    DOB: May 04, 1946,  MRN: [id number]  [name] [name] presents to clinic today for painful thick toenails that are difficult to trim. Pain interferes with ambulation. Aggravating factors include wearing enclosed shoe gear. Pain is relieved with periodic professional debridement..  75 y.o. male presents with the above complaint.    Review of Systems: Negative except as noted in the HPI. Past Medical History:  Diagnosis Date  . Arthritis   . CAD (coronary artery disease)    cath 3/10: mLAD 30%, pRCA 30%, EF 55-60% Dr. Harrington Challenger cardiologist  most recent  stress stress done ~ 2 years ago with Dr. Harrington Challenger  . Complication of anesthesia    "he usually gets an ileus after back OR"   . Depression   . Dyspnea    -PFTs compeltely normal 03/20/08 including DLc0  . Environmental allergies    Dust, Smoke  . GERD (gastroesophageal reflux disease)   . HTN (hypertension)   . Hypothyroidism   . Low testosterone   . Observed sleep apnea    can't wear cpap  . Polycythemia   . PONV (postoperative nausea and vomiting)   . Pulmonary nodule 06/13/2012  . Rosacea   . Sleep apnea    Most recent sleep study 2010; records at [facility]   Past Surgical History:  Procedure Laterality Date  . BACK SURGERY  2005; 06/2006; 01/2007; 04/2009  . CARDIAC CATHETERIZATION  08/2008  . CATARACT EXTRACTION, BILATERAL  2010  . CERVICAL FUSION  04/2002   ?C3-4  . COLONOSCOPY  12/2005   normal screening study.   . ESOPHAGOGASTRODUODENOSCOPY  multiple   last 07/2012 GERD esophagitis, 48 Fr dilation  . ESOPHAGOGASTRODUODENOSCOPY N/A 09/19/2015   Procedure: ESOPHAGOGASTRODUODENOSCOPY (EGD);  Surgeon: [name], MD;  Location: [name] ENDOSCOPY;  Service: Endoscopy;  Laterality: N/A;  . KNEE ARTHROSCOPY WITH MEDIAL MENISECTOMY Right 05/14/2015   Procedure: RIGHT KNEE ARTHROSCOPY CHONDROPLASTY, PARTIAL  MEDIAL MENISECTOMY;  Surgeon: [name], MD;  Location: [place];  Service: Orthopedics;  Laterality: Right;  . left knee replacement    . left shoudler clean    . LUMBAR DISC SURGERY  06/2006   L3  . MENISCUS REPAIR  08/2008   left  . NECK SURGERY    . POSTERIOR FUSION LUMBAR SPINE  2005; 01/2007; 04/2009   L5; L3-4; L2-3  . PROSTATE SURGERY  2000  . SAVORY DILATION N/A 09/19/2015   Procedure: SAVORY DILATION;  Surgeon: [name], MD;  Location: [facility] ENDOSCOPY;  Service: Endoscopy;  Laterality: N/A;  . SHOULDER OPEN ROTATOR CUFF REPAIR Left 1999   left  . TONSILLECTOMY  09/21/11  . TOTAL KNEE ARTHROPLASTY Left 01/2011   left  . TOTAL KNEE ARTHROPLASTY Right 08/08/2015   Procedure: RIGHT TOTAL KNEE ARTHROPLASTY;  Surgeon: [name], MD;  Location: [place];  Service: Orthopedics;  Laterality: Right;  . TRANSURETHRAL RESECTION OF PROSTATE  2006   followed by "surgery to get rid of clots"  . UVULOPALATOPHARYNGOPLASTY, TONSILLECTOMY AND SEPTOPLASTY  09/21/11   Deviated Septum    Current Outpatient Medications:  .  aspirin EC 81 MG tablet, Take 1 tablet (81 mg total) by mouth 2 (two) times daily., Disp: , Rfl:  .  cholecalciferol (VITAMIN D3) 25 MCG (1000 UNIT) tablet, Take 2,000 Units by mouth daily., Disp: , Rfl:  .  DULoxetine (CYMBALTA) 30 MG capsule, TAKE 1 CAPSULE BY MOUTH  DAILY, Disp: 90 capsule, Rfl: 1 .  furosemide (LASIX) 40 MG tablet, Take one tablet by mouth two days per week, Disp: 30 tablet, Rfl: 3 .  gabapentin (NEURONTIN) 300 MG capsule, TAKE 1 CAPSULE BY MOUTH TWICE PER DAY, Disp: , Rfl:  .  levothyroxine (SYNTHROID) 150 MCG tablet, TAKE 1 TABLET BY MOUTH  DAILY BEFORE BREAKFAST, Disp: 90 tablet, Rfl: 1 .  losartan (COZAAR) 50 MG tablet, TAKE 1 TABLET BY MOUTH  DAILY, Disp: 90 tablet, Rfl: 1 .  metoprolol succinate (TOPROL-XL) 25 MG 24 hr tablet, TAKE 1 TABLET BY MOUTH  DAILY, Disp: 90 tablet, Rfl: 3 .  omeprazole (PRILOSEC) 40 MG capsule, TAKE 1 CAPSULE BY MOUTH  DAILY 30 MINUTES BEFORE  BREAKFAST, Disp: 90 capsule,  Rfl: 1 .  polyethylene glycol (MIRALAX) 17 g packet, Take 17 g by mouth daily., Disp: , Rfl:  .  potassium chloride SA (KLOR-CON) 20 MEQ tablet, Take one tablet by mouth two days per week, Disp: 30 tablet, Rfl: 3 .  simvastatin (ZOCOR) 20 MG tablet, TAKE 1 TABLET BY MOUTH  DAILY AT 6 PM., Disp: 90 tablet, Rfl: 1 .  UNABLE TO FIND, Mega red 900mg  one per day, Disp: , Rfl:  Allergies  Allergen Reactions  . Penicillins Hives and Other (See Comments)    "I break out in welts" Has patient had a PCN reaction causing immediate rash, facial/tongue/throat swelling, SOB or lightheadedness with hypotension: No Has patient had a PCN reaction causing severe rash involving mucus membranes or skin necrosis: No Has patient had a PCN reaction that required hospitalization No Has patient had a PCN reaction occurring within the last 10 years: No If all of the above answers are "NO", then may proceed with Cephalosporin use.  . Codeine Nausea And Vomiting  . Sulfonamide Derivatives Nausea And Vomiting   Social History   Occupational History  . Occupation: retired    Fish farm manager: [employer]  Tobacco Use  . Smoking status: Former Smoker    Packs/day: 1.00    Years: 16.00    Pack years: 16.00    Quit date: 07/17/1974    Years since quitting: 46.0  . Smokeless tobacco: Former Network engineer  . Vaping Use: Never used  Substance and Sexual Activity  . Alcohol use: No  . Drug use: No  . Sexual activity: Not on file    Objective:   Constitutional [name] [name] is a pleasant 75 y.o. Caucasian male, in NAD. AAO x 3.   Vascular Capillary refill time to digits <4 seconds b/l lower extremities. Palpable DP pulse(s) left lower extremity Palpable PT pulse(s) b/l lower extremities Nonpalpable DP pulse(s) left lower extremity. Pedal hair absent. Lower extremity skin temperature gradient within normal limits. Peripheral cyanosis noted with dependency and elevation b/l LE. No gangrene noted b/l. No cyanosis or  clubbing noted.  Neurologic Normal speech. Oriented to person, place, and time. Protective sensation decreased with 10 gram monofilament b/l. Vibratory sensation diminished b/l.  Dermatologic Pedal skin with normal turgor, texture and tone bilaterally. No open wounds bilaterally. No interdigital macerations bilaterally. Toenails 1-5 b/l elongated, discolored, dystrophic, thickened, crumbly with subungual debris and tenderness to dorsal palpation. Hyperkeratotic lesion(s) submet head 4 left foot.  No erythema, no edema, no drainage, no fluctuance.  Orthopedic: Normal muscle strength 5/5 to all lower extremity muscle groups bilaterally. No pain crepitus or joint limitation noted with ROM b/l. No gross bony deformities bilaterally.   Radiographs: None Assessment:   1. Pain due to onychomycosis of toenails of both  feet   2. Callus   3. PVD (peripheral vascular disease) ([place])   4. Peripheral polyneuropathy    Plan:  Patient was evaluated and treated and all questions answered.  Onychomycosis with pain -Nails palliatively debridement as below -Educated on self-care  Procedure: Nail Debridement Rationale: Pain Type of Debridement: manual, sharp debridement. Instrumentation: Nail nipper, rotary burr. Number of Nails: 10 -Examined patient. -Patient to continue soft, supportive shoe gear daily. -Toenails 1-5 b/l were debrided in length and girth with sterile nail nippers and dremel without iatrogenic bleeding.  -Callus(es) submet head 4 left foot pared utilizing sterile scalpel blade without complication or incident. Total number debrided =1. -Patient to report any pedal injuries to medical professional immediately. -Patient/POA to call should there be question/concern in the interim.  Return in about 3 months (around 10/29/2020).  Marzetta Board, DPM

## 2020-08-13 NOTE — Progress Notes (Signed)
Cardiology Office Note   Date:  08/15/2020   ID:  [name] [name], DOB Feb 22, 1946, MRN [id number]  PCP:  [name], MD  Cardiologist:   [name] Carnes, MD   F/U OF CAD    History of Present Illness: [name] [name] is a 75 y.o. male with a history of  Nonobstructive CAD  LVEF normal GERD, HTN, HL OSA, thoracic aneurysm  Mild 4.1 mm  Seen by [facility] [place]  And Kathleen Argue I last saw the pt in Jan 2021  I saw the pt in Nov 2021  At that visit his BP was elevated and his ankles were swollen.   I recomm adding Lasix 2x per wk to his regimen    Since seen the pt says he urinates more when he takes lasix    He still has LE edema. He does admit to eatnig sausage and bacon 1x per wk  Also eats lunch meet    Drinks 1/2 pepsi at lunch  Occasional sweet tea Not  active   Gets up   Eats  WOrks on computer for about 1 hour Takes  3 hour nap   Eats  Watches  TV   Goes to bed Pt says he is very unsteady on feet. Stopped using CPAP after surgery in 2013  Doesn't feel he needs it  Denies CP  Breathing is OK at rest   Gets occasional hot flushing sensation  Current Meds  Medication Sig  . aspirin EC 81 MG tablet Take 1 tablet (81 mg total) by mouth 2 (two) times daily.  . cholecalciferol (VITAMIN D3) 25 MCG (1000 UNIT) tablet Take 2,000 Units by mouth daily.  . DULoxetine (CYMBALTA) 30 MG capsule TAKE 1 CAPSULE BY MOUTH  DAILY  . gabapentin (NEURONTIN) 300 MG capsule TAKE 1 CAPSULE BY MOUTH TWICE PER DAY  . levothyroxine (SYNTHROID) 150 MCG tablet TAKE 1 TABLET BY MOUTH  DAILY BEFORE BREAKFAST  . losartan-hydrochlorothiazide (HYZAAR) 100-25 MG tablet Take 0.5 tablets by mouth daily.  . metoprolol succinate (TOPROL-XL) 25 MG 24 hr tablet TAKE 1 TABLET BY MOUTH  DAILY  . omeprazole (PRILOSEC) 40 MG capsule TAKE 1 CAPSULE BY MOUTH  DAILY 30 MINUTES BEFORE  BREAKFAST  . polyethylene glycol (MIRALAX / GLYCOLAX) 17 g packet Take 17 g by mouth daily.  . potassium chloride SA (KLOR-CON) 20 MEQ tablet Take one  tablet by mouth two days per week  . simvastatin (ZOCOR) 20 MG tablet TAKE 1 TABLET BY MOUTH  DAILY AT 6 PM.  . UNABLE TO FIND Mega red 900mg  one per day  . [DISCONTINUED] furosemide (LASIX) 40 MG tablet Take one tablet by mouth two days per week  . [DISCONTINUED] losartan (COZAAR) 50 MG tablet TAKE 1 TABLET BY MOUTH  DAILY     Allergies:   Penicillins, Codeine, and Sulfonamide derivatives   Past Medical History:  Diagnosis Date  . Arthritis   . CAD (coronary artery disease)    cath 3/10: mLAD 30%, pRCA 30%, EF 55-60% Dr. Harrington Challenger cardiologist  most recent  stress stress done ~ 2 years ago with Dr. Harrington Challenger  . Complication of anesthesia    "he usually gets an ileus after back OR"   . Depression   . Dyspnea    -PFTs compeltely normal 03/20/08 including DLc0  . Environmental allergies    Dust, Smoke  . GERD (gastroesophageal reflux disease)   . HTN (hypertension)   . Hypothyroidism   . Low testosterone   .  Observed sleep apnea    can't wear cpap  . Polycythemia   . PONV (postoperative nausea and vomiting)   . Pulmonary nodule 06/13/2012  . Rosacea   . Sleep apnea    Most recent sleep study 2010; records at [facility]    Past Surgical History:  Procedure Laterality Date  . BACK SURGERY  2005; 06/2006; 01/2007; 04/2009  . CARDIAC CATHETERIZATION  08/2008  . CATARACT EXTRACTION, BILATERAL  2010  . CERVICAL FUSION  04/2002   ?C3-4  . COLONOSCOPY  12/2005   normal screening study.   . ESOPHAGOGASTRODUODENOSCOPY  multiple   last 07/2012 GERD esophagitis, 48 Fr dilation  . ESOPHAGOGASTRODUODENOSCOPY N/A 09/19/2015   Procedure: ESOPHAGOGASTRODUODENOSCOPY (EGD);  Surgeon: [name], MD;  Location[facility] ENDOSCOPY;  Service: Endoscopy;  Laterality: N/A;  . KNEE ARTHROSCOPY WITH MEDIAL MENISECTOMY Right 05/14/2015   Procedure: RIGHT KNEE ARTHROSCOPY CHONDROPLASTY, PARTIAL  MEDIAL MENISECTOMY;  Surgeon: [name], MD;  Location: [place];  Service: Orthopedics;   Laterality: Right;  . left knee replacement    . left shoudler clean    . LUMBAR DISC SURGERY  06/2006   L3  . MENISCUS REPAIR  08/2008   left  . NECK SURGERY    . POSTERIOR FUSION LUMBAR SPINE  2005; 01/2007; 04/2009   L5; L3-4; L2-3  . PROSTATE SURGERY  2000  . SAVORY DILATION N/A 09/19/2015   Procedure: SAVORY DILATION;  Surgeon: [name], MD;  Location[facility] ENDOSCOPY;  Service: Endoscopy;  Laterality: N/A;  . SHOULDER OPEN ROTATOR CUFF REPAIR Left 1999   left  . TONSILLECTOMY  09/21/11  . TOTAL KNEE ARTHROPLASTY Left 01/2011   left  . TOTAL KNEE ARTHROPLASTY Right 08/08/2015   Procedure: RIGHT TOTAL KNEE ARTHROPLASTY;  Surgeon: [name], MD;  Location: [place];  Service: Orthopedics;  Laterality: Right;  . TRANSURETHRAL RESECTION OF PROSTATE  2006   followed by "surgery to get rid of clots"  . UVULOPALATOPHARYNGOPLASTY, TONSILLECTOMY AND SEPTOPLASTY  09/21/11   Deviated Septum     Social History:  The patient  reports that he quit smoking about 46 years ago. He has a 16.00 pack-year smoking history. He has quit using smokeless tobacco. He reports that he does not drink alcohol and does not use drugs.   Family History:  The patient's family history includes Alzheimer's disease in his cousin and father; Bipolar disorder in an other family member; Breast cancer in his mother and sister; Breast cancer (age of onset: 13) in his sister; Cirrhosis in his maternal uncle; Colon polyps in his mother; Diabetes in his mother and sister; Heart Problems in his sister; Heart attack in his cousin, father, and paternal grandfather; Liver cancer in his maternal uncle; Lung cancer in his paternal uncle, paternal uncle, and paternal uncle; Other in his daughter and sister; Parkinson's disease in an other family member; Stroke in his paternal grandmother; Throat cancer in his maternal uncle.    ROS:  Please see the history of present illness. All other systems are reviewed and  Negative to the above  problem except as noted.    PHYSICAL EXAM: VS:  BP (!) 148/64   Pulse 73   Ht 5\' 10"  (1.778 m)   Wt 281 lb 12.8 oz (127.8 kg)   SpO2 98%   BMI 40.43 kg/m   GEN: Morbidly obese 75 yo in no acute distress  HEENT: normal  Neck: JVP is normal   Cardiac: RRR; No Sgnif murmurs  1-2+ LE edema   Respiratory:  clear to auscultation bilaterally GI: Obese.  Distended.  Nontender. MS: No deformity     Skin: warm and dry, Feet calves are mildly erythematous Neuro:  CN II to XII intact   Psych: euthymic mood, full affect   EKG:  EKG is  ordered   SR 73  Occasional PVC  RBBB  LAFB  First degree AVBlock  PR 200 msec  Lipid Panel    Component Value Date/Time   CHOL 103 09/05/2019 1051   TRIG 128.0 09/05/2019 1051   HDL 29.20 (L) 09/05/2019 1051   CHOLHDL 4 09/05/2019 1051   VLDL 25.6 09/05/2019 1051   LDLCALC 49 09/05/2019 1051      Wt Readings from Last 3 Encounters:  08/15/20 281 lb 12.8 oz (127.8 kg)  05/12/20 283 lb 6.4 oz (128.5 kg)  02/26/20 280 lb (127 kg)      ASSESSMENT AND PLAN:   1  LE edema   Persists  Will have the pt take 80 lasix tomorrow then 40 laisx every other day      COntinue to take K with lasix   Check BMET in 10 days   Would also set up for echo to reevaluate LV function    Discussed salt in diet  Needs to cut back to 2.5-3 g Na per day   2 CAD.  Nonobstructive.. Denies CP  Pt with interimtt flushing   I am not convinced represents angina    3  HTN   Switch losartan to 50/12.5 (Losartan/HCTZ)    Follow BP and electrolytes   4  Aorta  Aorta is 4.1 on CT back in June  Will follow with echo    5  Lipids.  Continue statin       Current medicines are reviewed at length with the patient today.  The patient does not have concerns regarding medicines.  Signed, [name] Carnes, MD  08/15/2020 8:38 AM    [address], [place], [place]  [address] Phone: [phone]; Fax: [phone]

## 2020-08-15 ENCOUNTER — Other Ambulatory Visit: Payer: Self-pay

## 2020-08-15 ENCOUNTER — Ambulatory Visit: Payer: Medicare Other | Admitting: Internal Medicine

## 2020-08-15 ENCOUNTER — Encounter: Payer: Self-pay | Admitting: Internal Medicine

## 2020-08-15 VITALS — BP 148/64 | HR 73 | Ht 70.0 in | Wt 281.8 lb

## 2020-08-15 DIAGNOSIS — I2510 Atherosclerotic heart disease of native coronary artery without angina pectoris: Secondary | ICD-10-CM | POA: Diagnosis not present

## 2020-08-15 DIAGNOSIS — I10 Essential (primary) hypertension: Secondary | ICD-10-CM | POA: Diagnosis not present

## 2020-08-15 DIAGNOSIS — R2689 Other abnormalities of gait and mobility: Secondary | ICD-10-CM | POA: Diagnosis not present

## 2020-08-15 DIAGNOSIS — M7989 Other specified soft tissue disorders: Secondary | ICD-10-CM

## 2020-08-15 MED ORDER — POTASSIUM CHLORIDE CRYS ER 20 MEQ PO TBCR: EXTENDED_RELEASE_TABLET | ORAL | 3 refills | Status: DC

## 2020-08-15 MED ORDER — FUROSEMIDE 40 MG PO TABS: 40.0000 mg | ORAL_TABLET | ORAL | 3 refills | Status: DC

## 2020-08-15 MED ORDER — LOSARTAN POTASSIUM-HCTZ 100-25 MG PO TABS: 0.5000 | ORAL_TABLET | Freq: Every day | ORAL | 3 refills | Status: DC

## 2020-08-15 NOTE — Patient Instructions (Addendum)
Medication Instructions:  Your physician has recommended you make the following change in your medication:   1.) tomorrow - take 2 furosemide tablets (80 mg) then:skip Sunday...then starting Monday--take one tablet (40 mg) every other day 2.) stop losartan 3.) start losartan-hctz 100 - 25 mg - HALF TABLET DAILY  *If you need a refill on your cardiac medications before your next appointment, please call your pharmacy*   Lab Work: In 10 days - please return for BMET  If you have labs (blood work) drawn today and your tests are completely normal, you will receive your results only by: [name] MyChart Message (if you have MyChart) OR . A paper copy in the mail If you have any lab test that is abnormal or we need to change your treatment, we will call you to review the results.   Testing/Procedures: Your physician has requested that you have an echocardiogram. Echocardiography is a painless test that uses sound waves to create images of your heart. It provides your doctor with information about the size and shape of your heart and how well your heart's chambers and valves are working. This procedure takes approximately one hour. There are no restrictions for this procedure.   Follow-Up: Add in 3/3 at 11:40 am with Dr. Harrington Challenger

## 2020-08-25 ENCOUNTER — Other Ambulatory Visit: Payer: Self-pay

## 2020-08-25 ENCOUNTER — Other Ambulatory Visit: Payer: Medicare Other | Admitting: *Deleted

## 2020-08-25 DIAGNOSIS — I10 Essential (primary) hypertension: Secondary | ICD-10-CM

## 2020-08-25 DIAGNOSIS — I2510 Atherosclerotic heart disease of native coronary artery without angina pectoris: Secondary | ICD-10-CM

## 2020-08-25 DIAGNOSIS — M7989 Other specified soft tissue disorders: Secondary | ICD-10-CM

## 2020-08-26 LAB — BASIC METABOLIC PANEL
BUN/Creatinine Ratio: 20 (ref 10–24)
BUN: 17 mg/dL (ref 8–27)
CO2: 23 mmol/L (ref 20–29)
Calcium: 9.8 mg/dL (ref 8.6–10.2)
Chloride: 96 mmol/L (ref 96–106)
Creatinine, Ser: 0.84 mg/dL (ref 0.76–1.27)
Glucose: 98 mg/dL (ref 65–99)
Potassium: 4.5 mmol/L (ref 3.5–5.2)
Sodium: 139 mmol/L (ref 134–144)
eGFR: 92 mL/min/{1.73_m2} (ref >59)

## 2020-08-26 NOTE — Progress Notes (Unsigned)
Cardiology Office Note   Date:  08/28/2020   ID:  [name] [name], DOB 1945-10-20, MRN [id number]  PCP:  [name], MD  Cardiologist:   [name], MD   F/U of LE edema    History of Present Illness: [name] [name] is a 75 y.o. male with a history of  Nonobstructive CAD  LVEF normal GERD, HTN, HL OSA, thoracic aneurysm  Mild 4.1 mm    I saw the pt in Aug 15 2020  At that time he complained of increased LE swelling   He also complained of weakness and unsteadiness  After visit labs were drawn   Based on this I recomm that he take Lasix 40 every other day and KCL with this   I also switched him to losartan / HCTZ    Since seen he says he thinks the LE swelling is a little better   Breathing is rel stable   He denies CP  He has appt in neuro rehab tomorrow    Current Meds  Medication Sig  . aspirin EC 81 MG tablet Take 1 tablet (81 mg total) by mouth 2 (two) times daily.  . cholecalciferol (VITAMIN D3) 25 MCG (1000 UNIT) tablet Take 2,000 Units by mouth daily.  . DULoxetine (CYMBALTA) 30 MG capsule TAKE 1 CAPSULE BY MOUTH  DAILY  . gabapentin (NEURONTIN) 300 MG capsule TAKE 1 CAPSULE BY MOUTH TWICE PER DAY  . levothyroxine (SYNTHROID) 150 MCG tablet TAKE 1 TABLET BY MOUTH  DAILY BEFORE BREAKFAST  . losartan-hydrochlorothiazide (HYZAAR) 100-25 MG tablet Take 0.5 tablets by mouth daily.  . metoprolol succinate (TOPROL-XL) 25 MG 24 hr tablet TAKE 1 TABLET BY MOUTH  DAILY  . Multiple Vitamins-Minerals (ICAPS AREDS 2 PO) Take 1 capsule by mouth daily.  [name] omeprazole (PRILOSEC) 40 MG capsule TAKE 1 CAPSULE BY MOUTH  DAILY 30 MINUTES BEFORE  BREAKFAST  . polyethylene glycol (MIRALAX / GLYCOLAX) 17 g packet Take 17 g by mouth daily.  . simvastatin (ZOCOR) 20 MG tablet TAKE 1 TABLET BY MOUTH  DAILY AT 6 PM.  . UNABLE TO FIND Mega red 1000mg  one per day  . [DISCONTINUED] furosemide (LASIX) 40 MG tablet Take 1 tablet (40 mg total) by mouth every other day.  . [DISCONTINUED] potassium  chloride SA (KLOR-CON) 20 MEQ tablet Take one tablet by mouth every other day.  Take with Lasix     Allergies:   Penicillins, Codeine, and Sulfonamide derivatives   Past Medical History:  Diagnosis Date  . Arthritis   . CAD (coronary artery disease)    cath 3/10: mLAD 30%, pRCA 30%, EF 55-60% Dr. Harrington Challenger cardiologist  most recent  stress stress done ~ 2 years ago with Dr. Harrington Challenger  . Complication of anesthesia    "he usually gets an ileus after back OR"   . Depression   . Dyspnea    -PFTs compeltely normal 03/20/08 including DLc0  . Environmental allergies    Dust, Smoke  . GERD (gastroesophageal reflux disease)   . HTN (hypertension)   . Hypothyroidism   . Low testosterone   . Observed sleep apnea    can't wear cpap  . Polycythemia   . PONV (postoperative nausea and vomiting)   . Pulmonary nodule 06/13/2012  . Rosacea   . Sleep apnea    Most recent sleep study 2010; records [facility]    Past Surgical History:  Procedure Laterality Date  . BACK SURGERY  2005; 06/2006; 01/2007;  04/2009  . CARDIAC CATHETERIZATION  08/2008  . CATARACT EXTRACTION, BILATERAL  2010  . CERVICAL FUSION  04/2002   ?C3-4  . COLONOSCOPY  12/2005   normal screening study.   . ESOPHAGOGASTRODUODENOSCOPY  multiple   last 07/2012 GERD esophagitis, 48 Fr dilation  . ESOPHAGOGASTRODUODENOSCOPY N/A 09/19/2015   Procedure: ESOPHAGOGASTRODUODENOSCOPY (EGD);  Surgeon: [name], MD;  Location: [facility] ENDOSCOPY;  Service: Endoscopy;  Laterality: N/A;  . KNEE ARTHROSCOPY WITH MEDIAL MENISECTOMY Right 05/14/2015   Procedure: RIGHT KNEE ARTHROSCOPY CHONDROPLASTY, PARTIAL  MEDIAL MENISECTOMY;  Surgeon: [name], MD;  Location: [facility];  Service: Orthopedics;  Laterality: Right;  . left knee replacement    . left shoudler clean    . LUMBAR DISC SURGERY  06/2006   L3  . MENISCUS REPAIR  08/2008   left  . NECK SURGERY    . POSTERIOR FUSION LUMBAR SPINE  2005; 01/2007; 04/2009   L5;  L3-4; L2-3  . PROSTATE SURGERY  2000  . SAVORY DILATION N/A 09/19/2015   Procedure: SAVORY DILATION;  Surgeon: [name], MD;  Location: [facility] ENDOSCOPY;  Service: Endoscopy;  Laterality: N/A;  . SHOULDER OPEN ROTATOR CUFF REPAIR Left 1999   left  . TONSILLECTOMY  09/21/11  . TOTAL KNEE ARTHROPLASTY Left 01/2011   left  . TOTAL KNEE ARTHROPLASTY Right 08/08/2015   Procedure: RIGHT TOTAL KNEE ARTHROPLASTY;  Surgeon: [name], MD;  Location: [facility];  Service: Orthopedics;  Laterality: Right;  . TRANSURETHRAL RESECTION OF PROSTATE  2006   followed by "surgery to get rid of clots"  . UVULOPALATOPHARYNGOPLASTY, TONSILLECTOMY AND SEPTOPLASTY  09/21/11   Deviated Septum     Social History:  The patient  reports that he quit smoking about 46 years ago. He has a 16.00 pack-year smoking history. He has quit using smokeless tobacco. He reports that he does not drink alcohol and does not use drugs.   Family History:  The patient's family history includes Alzheimer's disease in his cousin and father; Bipolar disorder in an other family member; Breast cancer in his mother and sister; Breast cancer (age of onset: 59) in his sister; Cirrhosis in his maternal uncle; Colon polyps in his mother; Diabetes in his mother and sister; Heart Problems in his sister; Heart attack in his cousin, father, and paternal grandfather; Liver cancer in his maternal uncle; Lung cancer in his paternal uncle, paternal uncle, and paternal uncle; Other in his daughter and sister; Parkinson's disease in an other family member; Stroke in his paternal grandmother; Throat cancer in his maternal uncle.    ROS:  Please see the history of present illness. All other systems are reviewed and  Negative to the above problem except as noted.    PHYSICAL EXAM: VS:  BP (!) 100/48   Pulse 66   Ht 5\' 10"  (1.778 m)   Wt 278 lb 12.8 oz (126.5 kg)   SpO2 97%   BMI 40.00 kg/m   GEN: Morbidly obese 75 yo in no acute distress  HEENT: normal   Neck: JVP is normal   Cardiac: RRR; No Sgnif murmurs   1+  LE edema   Respiratory:  clear to auscultation bilaterally GI: Obese.  Distended.  Nontender. MS: No deformity     Skin: warm and dry, Feet calves are minimally erythematous Neuro:  CN II to XII intact   Psych: euthymic mood, full affect   EKG:  EKG is not   ordered    Component Value Date/Time  CHOL 103 09/05/2019 1051   TRIG 128.0 09/05/2019 1051   HDL 29.20 (L) 09/05/2019 1051   CHOLHDL 4 09/05/2019 1051   VLDL 25.6 09/05/2019 1051   LDLCALC 49 09/05/2019 1051      Wt Readings from Last 3 Encounters:  08/28/20 278 lb 12.8 oz (126.5 kg)  08/15/20 281 lb 12.8 oz (127.8 kg)  05/12/20 283 lb 6.4 oz (128.5 kg)      ASSESSMENT AND PLAN:   1  LE edema  Swelling is a lttle better   Echo is still pending  I would increase lasix to 40 daily with KCL   Check BMET in 2 wk  F/U later this spring    Watch salt   2 CAD.  Nonobstructive dz   He is not complainig of CP    Follow     3  HTN   Keep on  losartan to 50/12.5 (Losartan/HCTZ)      Follow   4  Aorta  Aorta is 4.1 on CT back in June  Will follow with echo    5  Lipids.  Continue statin  Lpipids in March 2021 LDL 49  HDL 29  Trig 128      F/U in clinic later this spring   Will be in touch with pt with results in a couple with (labs, echo)  Current medicines are reviewed at length with the patient today.  The patient does not have concerns regarding medicines.  Signed, [name], MD  08/28/2020 1:39 PM    [facility] [place], [place], [place]  [address] Phone: [phone]; Fax: [phone]

## 2020-08-28 ENCOUNTER — Ambulatory Visit: Payer: Medicare Other | Admitting: Internal Medicine

## 2020-08-28 ENCOUNTER — Other Ambulatory Visit: Payer: Self-pay

## 2020-08-28 ENCOUNTER — Other Ambulatory Visit: Payer: Medicare Other

## 2020-08-28 VITALS — BP 100/48 | HR 66 | Ht 70.0 in | Wt 278.8 lb

## 2020-08-28 DIAGNOSIS — I10 Essential (primary) hypertension: Secondary | ICD-10-CM

## 2020-08-28 DIAGNOSIS — M7989 Other specified soft tissue disorders: Secondary | ICD-10-CM

## 2020-08-28 MED ORDER — POTASSIUM CHLORIDE CRYS ER 20 MEQ PO TBCR: 20.0000 meq | EXTENDED_RELEASE_TABLET | Freq: Every day | ORAL | 3 refills | Status: DC

## 2020-08-28 MED ORDER — FUROSEMIDE 40 MG PO TABS: 40.0000 mg | ORAL_TABLET | Freq: Every day | ORAL | 3 refills | Status: DC

## 2020-08-28 NOTE — Patient Instructions (Signed)
Medication Instructions:  1.) change lasix to daily 2.) change potassium to daily *If you need a refill on your cardiac medications before your next appointment, please call your pharmacy*   Lab Work: BMET - in 2 weeks when you return for echo   Testing/Procedures: Echo as scheduled.  Follow-Up: See below.   Other Instructions Follow blood pressure at home and call or bring readings to your next visit.

## 2020-08-29 ENCOUNTER — Ambulatory Visit: Payer: Medicare Other | Attending: Internal Medicine

## 2020-08-29 VITALS — BP 122/70

## 2020-08-29 DIAGNOSIS — M6281 Muscle weakness (generalized): Secondary | ICD-10-CM | POA: Diagnosis present

## 2020-08-29 DIAGNOSIS — R2681 Unsteadiness on feet: Secondary | ICD-10-CM | POA: Diagnosis present

## 2020-08-29 DIAGNOSIS — R2689 Other abnormalities of gait and mobility: Secondary | ICD-10-CM | POA: Insufficient documentation

## 2020-08-29 NOTE — Therapy (Signed)
[facility] [address] [place], [place], [address] Phone: [phone]   Fax:  [phone]  Physical Therapy Evaluation  Patient Details  Name: [name] [name] MRN: [id number] Date of Birth: 1945-11-13 Referring Provider (PT): [name]   Encounter Date: 08/29/2020   PT End of Session - 08/29/20 1100    Visit Number 1    Number of Visits 17    Date for PT Re-Evaluation 11/27/20   60 day poc, 59 day cert   Authorization Type [employer] medicare so 10th visit progress note    PT Start Time 1058    PT Stop Time 1147    PT Time Calculation (min) 49 min    Equipment Utilized During Treatment Gait belt    Activity Tolerance Patient limited by fatigue    Behavior During Therapy WFL for tasks assessed/performed           Past Medical History:  Diagnosis Date  . Arthritis   . CAD (coronary artery disease)    cath 3/10: mLAD 30%, pRCA 30%, EF 55-60% Dr. Harrington Challenger cardiologist  most recent  stress stress done ~ 2 years ago with Dr. Harrington Challenger  . Complication of anesthesia    "he usually gets an ileus after back OR"   . Depression   . Dyspnea    -PFTs compeltely normal 03/20/08 including DLc0  . Environmental allergies    Dust, Smoke  . GERD (gastroesophageal reflux disease)   . HTN (hypertension)   . Hypothyroidism   . Low testosterone   . Observed sleep apnea    can't wear cpap  . Polycythemia   . PONV (postoperative nausea and vomiting)   . Pulmonary nodule 06/13/2012  . Rosacea   . Sleep apnea    Most recent sleep study 2010; records at [facility]    Past Surgical History:  Procedure Laterality Date  . BACK SURGERY  2005; 06/2006; 01/2007; 04/2009  . CARDIAC CATHETERIZATION  08/2008  . CATARACT EXTRACTION, BILATERAL  2010  . CERVICAL FUSION  04/2002   ?C3-4  . COLONOSCOPY  12/2005   normal screening study.   . ESOPHAGOGASTRODUODENOSCOPY  multiple   last 07/2012 GERD esophagitis, 48 Fr dilation  .  ESOPHAGOGASTRODUODENOSCOPY N/A 09/19/2015   Procedure: ESOPHAGOGASTRODUODENOSCOPY (EGD);  Surgeon: [name], MD;  Location[facility] ENDOSCOPY;  Service: Endoscopy;  Laterality: N/A;  . KNEE ARTHROSCOPY WITH MEDIAL MENISECTOMY Right 05/14/2015   Procedure: RIGHT KNEE ARTHROSCOPY CHONDROPLASTY, PARTIAL  MEDIAL MENISECTOMY;  Surgeon: [name], MD;  Location: [facility];  Service: Orthopedics;  Laterality: Right;  . left knee replacement    . left shoudler clean    . LUMBAR DISC SURGERY  06/2006   L3  . MENISCUS REPAIR  08/2008   left  . NECK SURGERY    . POSTERIOR FUSION LUMBAR SPINE  2005; 01/2007; 04/2009   L5; L3-4; L2-3  . PROSTATE SURGERY  2000  . SAVORY DILATION N/A 09/19/2015   Procedure: SAVORY DILATION;  Surgeon: [name], MD;  Location: [facility] ENDOSCOPY;  Service: Endoscopy;  Laterality: N/A;  . SHOULDER OPEN ROTATOR CUFF REPAIR Left 1999   left  . TONSILLECTOMY  09/21/11  . TOTAL KNEE ARTHROPLASTY Left 01/2011   left  . TOTAL KNEE ARTHROPLASTY Right 08/08/2015   Procedure: RIGHT TOTAL KNEE ARTHROPLASTY;  Surgeon: [name], MD;  Location: [facility];  Service: Orthopedics;  Laterality: Right;  . TRANSURETHRAL RESECTION OF PROSTATE  2006   followed by "  surgery to get rid of clots"  . UVULOPALATOPHARYNGOPLASTY, TONSILLECTOMY AND SEPTOPLASTY  09/21/11   Deviated Septum    Vitals:   08/29/20 1100  BP: 122/70      Subjective Assessment - 08/29/20 1100    Subjective Pt was referred by cardiologist for poor balance. Pt reports he has therapy in past a few times over past 10 years. Reports that his legs just seem to be weaker and he is less steady. Pt has noticed increased swelling in legs the past year. Reports he does get some tingling in feet at times worse on right foot.Pt reports that even though he has had neck surgery he needs more surgery but MD does not feel that it is safe. Pt walks with RW when goes out but has rollator he uses around the home.     Pertinent History PMH of  Nonobstructive CAD  LVEF normal GERD, HTN, HL OSA, thoracic aneurysm  Mild 4.1 mm , arthritis, back surgery, left shoulder replacement, neck surgery, bilateral TKR, lower extremity edema    Patient Stated Goals Pt would like to be able to walk better, perhaps without walker.    Currently in Pain? Yes    Pain Score 3    worst pain 8/10   Pain Location Back   and legs   Pain Orientation Lower    Pain Descriptors / Indicators Aching    Pain Type Chronic pain    Pain Onset More than a month ago    Pain Frequency Intermittent    Aggravating Factors  walking and standing longer time periods    Pain Relieving Factors sitting              [facility] PT Assessment - 08/29/20 1114      Assessment   Medical Diagnosis poor balance    Referring Provider (PT) [name]    Onset Date/Surgical Date 08/15/20    Prior Therapy reports on and off over last 10 years      Precautions   Precautions Fall      Balance Screen   Has the patient fallen in the past 6 months Yes    How many times? multiple   due to balance   Has the patient had a decrease in activity level because of a fear of falling?  Yes    Is the patient reluctant to leave their home because of a fear of falling?  Yes      [place] Private residence    Living Arrangements Spouse/significant other    Available Help at Discharge Family    Type of [place] to enter    Entrance Stairs-Number of Steps 10    Entrance Stairs-Rails Right;Left;Can reach both    [place] Two level   enters home through [place] - 4 wheels;Walker - 2 wheels;Shower seat;Grab bars - tub/shower    Additional Comments keeps walkers a top and bottom of stairs      Prior Function   Level of Independence Independent with basic ADLs;Independent with household mobility with device;Independent with community mobility with device    Vocation Retired    Leisure  reports he doesn't really have much fun. Does watch some movies.      Cognition   Overall Cognitive Status --   Pt lost train of thought a few times.     Observation/Other Assessments-Edema    Edema --   +2  pitting edema bilateral right worse than left.     Sensation   Light Touch Appears Intact      ROM / Strength   AROM / PROM / Strength Strength      Strength   Strength Assessment Site Shoulder;Elbow;Hand;Hip;Knee;Ankle    Right/Left Shoulder Right;Left    Right Shoulder Flexion 4/5    Left Shoulder Flexion 3+/5   limited to about 90 degrees with history of TSA   Right/Left Elbow Right;Left    Right Elbow Flexion 4/5    Right Elbow Extension 4/5    Left Elbow Flexion 4+/5    Left Elbow Extension 4+/5    Right/Left hand Right;Left    Right Hand Gross Grasp Functional    Left Hand Gross Grasp Functional    Right/Left Hip Right;Left    Right Hip Flexion 3+/5    Left Hip Flexion 4/5    Right/Left Knee Right;Left    Right Knee Flexion 3+/5    Right Knee Extension 4/5    Left Knee Flexion 4/5    Left Knee Extension 4+/5    Right/Left Ankle Right;Left    Right Ankle Dorsiflexion 3+/5    Left Ankle Dorsiflexion 4+/5      Transfers   Transfers Sit to Stand;Stand to Sit    Sit to Stand 4: Min guard    Sit to Stand Details Verbal cues for technique    Five time sit to stand comments  31.09 sec from chair with hands    Stand to Sit 4: Min guard    Comments Pt reports that he gets tired with sit to stands. O2=95%. Needs verbal cues to push from chair and not walker      Ambulation/Gait   Ambulation/Gait Yes    Ambulation/Gait Assistance 5: Supervision;4: Min guard    Ambulation/Gait Assistance Details Pt pushing walker out in front. Was cued to try to keep closer. Right foot clearance worse than left with no heel strike.    Ambulation Distance (Feet) 75 Feet    Assistive device Rolling walker    Gait Pattern Step-through pattern;Decreased step length - right;Decreased step  length - left;Decreased hip/knee flexion - right;Decreased hip/knee flexion - left;Poor foot clearance - left;Poor foot clearance - right;Trunk flexed    Gait velocity 18.95 sec=0.28m/s      Standardized Balance Assessment   Standardized Balance Assessment Timed Up and Go Test      Timed Up and Go Test   TUG Normal TUG    Normal TUG (seconds) 44                      Objective measurements completed on examination: See above findings.               PT Education - 08/29/20 1337    Education Details PT plan of care. Discussed elevating legs to help with swelling, drinking more water. Pt doesn't like water much so suggested trying to add a flavor to water to see if would help him drink more a mostly does pepsi and coffee.    Person(s) Educated Patient    Methods Explanation    Comprehension Verbalized understanding            PT Short Term Goals - 08/29/20 1815      PT SHORT TERM GOAL #1   Title Pt will be independent with HEP for strengthening and balance to continue gains on own.    Time 4    Period  Weeks    Status New    Target Date 09/28/20      PT SHORT TERM GOAL #2   Title Pt will decrease 5 x sit to stand from 31.09 sec to <25 sec for improved balance and functional strength.    Baseline 08/29/20 31.09 sec from chair with hands    Time 4    Period Weeks    Status New    Target Date 09/28/20      PT SHORT TERM GOAL #3   Title Pt will ambulate >300' with RW mod I on level surfaces for improved household and short community distances.    Time 4    Period Weeks    Status New    Target Date 09/28/20      PT SHORT TERM GOAL #4   Title Pt will ambulate up/down 10 steps with railing mod I for improved safety with accessing home.    Time 4    Period Weeks    Status New    Target Date 09/28/20             PT Long Term Goals - 08/29/20 1826      PT LONG TERM GOAL #1   Title Pt will increase gait speed form 0.69m/s to >0.78m/s for improved  gait safety.    Baseline 08/29/20 0.34m/s    Time 8    Period Weeks    Status New    Target Date 10/28/20      PT LONG TERM GOAL #2   Title Pt will ambulate >500' on varied surfaces supervision with RW for improved short community distances.    Time 8    Period Weeks    Status New    Target Date 10/28/20      PT LONG TERM GOAL #3   Title Pt will decrease TUG from 44 sec to <38 sec for improved balance and functional mobility.    Baseline 08/29/20 44 sec    Time 8    Period Weeks    Status New    Target Date 10/28/20      PT LONG TERM GOAL #4   Title Pt will be able to tolerate > 8 minutes of exercises for improved activity tolerance.    Time 8    Period Weeks    Status New    Target Date 10/28/20                  Plan - 08/29/20 1340    Clinical Impression Statement Pt is 75 y/o male referred for poor balance. Pt reports that he has gotten worse over last 10 years. Has extensive PMH including nonobstructive CAD,  LVEF normal, GERD, HTN, HL OSA, thoracic aneurysm  Mild 4.1 mm , arthritis, back surgery, left shoulder replacement, neck surgery, bilateral TKR, lower extremity edema. Pt has chronic pain still in low back which is not purpose of referral. Pt has decreased strength in BLE but most notable on right. Pt has history of multiple falls and is fall risk based on 5 x sit to stand of 31.09 sec and TUG of 44 sec. Pt walks with walker and has poor feet clearance with flexed posture. Pt's gait speed of 0.58m/s shows decreased safety for community ambulator. Pt fatigues quickly with activity. BP and O2 sats did well today. Pt will benefit from skilled PT to address strength, balance and functional mobility deficits.    Personal Factors and Comorbidities Comorbidity 3+;Time since onset of injury/illness/exacerbation;Fitness  Comorbidities Nonobstructive CAD  LVEF normal GERD, HTN, HL OSA, thoracic aneurysm  Mild 4.1 mm , arthritis, back surgery, left shoulder replacement, neck  surgery, bilateral TKR, lower extremity edema    Examination-Activity Limitations Locomotion Level;Transfers;Stairs;Stand    Examination-Participation Restrictions Art gallery manager;Yard Work    Merchant navy officer Stable/Uncomplicated    Clinical Decision Making Moderate    Rehab Potential Good    PT Frequency 2x / week   plus eval   PT Duration 8 weeks    PT Treatment/Interventions ADLs/Self Care Home Management;DME Instruction;Gait training;Stair training;Functional mobility training;Therapeutic activities;Therapeutic exercise;Balance training;Neuromuscular re-education;Manual techniques;Patient/family education;Passive range of motion;Vestibular    PT Next Visit Plan Assess stairs, gait training with walker. Pt needs to stay up in walker more and work on foot clearance. Initial strengthening HEP. I scheduled 4 weeks of 8 to see how it goes to begin.           Patient will benefit from skilled therapeutic intervention in order to improve the following deficits and impairments:  Decreased activity tolerance,Decreased balance,Decreased mobility,Decreased strength,Increased edema,Decreased knowledge of use of DME,Decreased endurance,Decreased range of motion,Postural dysfunction,Pain  Visit Diagnosis: Other abnormalities of gait and mobility  Muscle weakness (generalized)  Unsteadiness on feet     Problem List Patient Active Problem List   Diagnosis Date Noted  . 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 Onychomycosis of toenail 09/05/2019  . Ataxia involving legs 09/05/2019  . Chronic hyponatremia 10/17/2018  . Benign prostatic hyperplasia without lower urinary tract symptoms 09/18/2018  . Prediabetes 09/18/2018  . Claudication of both lower extremities ([place]) 09/18/2018  . Hyperlipidemia LDL goal <70 09/18/2018  . Pure hyperglyceridemia 09/18/2018  . Routine general medical examination at a health care facility 09/18/2018  . GERD (gastroesophageal  reflux disease)   . CAD (coronary artery disease)   . OSA (obstructive sleep apnea) 09/18/2015  . Mild concentric left ventricular hypertrophy (LVH) 09/18/2015  . 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[place]) 09/18/2015  . Hypothyroidism 09/18/2015  . Chronic idiopathic constipation 08/18/2015  . Primary localized osteoarthritis of right knee 08/08/2015  . Pulmonary nodule 06/13/2012  . CAD, NATIVE VESSEL 06/18/2009  . Essential hypertension 07/14/2007    Electa Sniff, PT, DPT, NCS 08/29/2020, 6:40 PM  [facility] [address] [place], [place], [address] Phone: [phone]   Fax:  [phone]  Name: [name] [name] MRN: [id number] Date of Birth: [birth year]

## 2020-09-03 ENCOUNTER — Ambulatory Visit: Payer: Medicare Other

## 2020-09-03 ENCOUNTER — Other Ambulatory Visit: Payer: Self-pay

## 2020-09-03 DIAGNOSIS — R2681 Unsteadiness on feet: Secondary | ICD-10-CM

## 2020-09-03 DIAGNOSIS — M6281 Muscle weakness (generalized): Secondary | ICD-10-CM

## 2020-09-03 DIAGNOSIS — R2689 Other abnormalities of gait and mobility: Secondary | ICD-10-CM | POA: Diagnosis not present

## 2020-09-03 NOTE — Patient Instructions (Signed)
Access Code: [email or web address] URL: [email or web address] Date: 09/03/2020 Prepared by: Cherly Anderson  Exercises Sit to Stand with Armchair - 2 x daily - 5 x weekly - 2 sets - 5 reps Seated March - 2 x daily - 5 x weekly - 2 sets - 10 reps Standing Toe Taps - 2 x daily - 5 x weekly - 2 sets - 10 reps

## 2020-09-03 NOTE — Therapy (Signed)
[facility] [address] [place], [place], [address] Phone: [phone]   Fax:  [phone]  Physical Therapy Treatment  Patient Details  Name: [name] [name] MRN: [id number] Date of Birth: 11/17/1945 Referring Provider (PT): [name]   Encounter Date: 09/03/2020   PT End of Session - 09/03/20 1147    Visit Number 2    Number of Visits 17    Date for PT Re-Evaluation 11/27/20   60 day poc, 36 day cert   Authorization Type [employer] medicare so 10th visit progress note    PT Start Time 1145    PT Stop Time 1229    PT Time Calculation (min) 44 min    Equipment Utilized During Treatment Gait belt    Activity Tolerance Patient limited by fatigue    Behavior During Therapy [facility] for tasks assessed/performed           Past Medical History:  Diagnosis Date  . Arthritis   . CAD (coronary artery disease)    cath 3/10: mLAD 30%, pRCA 30%, EF 55-60% Dr. Harrington Challenger cardiologist  most recent  stress stress done ~ 2 years ago with Dr. Harrington Challenger  . Complication of anesthesia    "he usually gets an ileus after back OR"   . Depression   . Dyspnea    -PFTs compeltely normal 03/20/08 including DLc0  . Environmental allergies    Dust, Smoke  . GERD (gastroesophageal reflux disease)   . HTN (hypertension)   . Hypothyroidism   . Low testosterone   . Observed sleep apnea    can't wear cpap  . Polycythemia   . PONV (postoperative nausea and vomiting)   . Pulmonary nodule 06/13/2012  . Rosacea   . Sleep apnea    Most recent sleep study 2010; records at [facility]    Past Surgical History:  Procedure Laterality Date  . BACK SURGERY  2005; 06/2006; 01/2007; 04/2009  . CARDIAC CATHETERIZATION  08/2008  . CATARACT EXTRACTION, BILATERAL  2010  . CERVICAL FUSION  04/2002   ?C3-4  . COLONOSCOPY  12/2005   normal screening study.   . ESOPHAGOGASTRODUODENOSCOPY  multiple   last 07/2012 GERD esophagitis, 48 Fr dilation  .  ESOPHAGOGASTRODUODENOSCOPY N/A 09/19/2015   Procedure: ESOPHAGOGASTRODUODENOSCOPY (EGD);  Surgeon: [name], MD;  Location: [facility] ENDOSCOPY;  Service: Endoscopy;  Laterality: N/A;  . KNEE ARTHROSCOPY WITH MEDIAL MENISECTOMY Right 05/14/2015   Procedure: RIGHT KNEE ARTHROSCOPY CHONDROPLASTY, PARTIAL  MEDIAL MENISECTOMY;  Surgeon: [name], MD;  Location: [facility];  Service: Orthopedics;  Laterality: Right;  . left knee replacement    . left shoudler clean    . LUMBAR DISC SURGERY  06/2006   L3  . MENISCUS REPAIR  08/2008   left  . NECK SURGERY    . POSTERIOR FUSION LUMBAR SPINE  2005; 01/2007; 04/2009   L5; L3-4; L2-3  . PROSTATE SURGERY  2000  . SAVORY DILATION N/A 09/19/2015   Procedure: SAVORY DILATION;  Surgeon: [name], MD;  Location: [facility] ENDOSCOPY;  Service: Endoscopy;  Laterality: N/A;  . SHOULDER OPEN ROTATOR CUFF REPAIR Left 1999   left  . TONSILLECTOMY  09/21/11  . TOTAL KNEE ARTHROPLASTY Left 01/2011   left  . TOTAL KNEE ARTHROPLASTY Right 08/08/2015   Procedure: RIGHT TOTAL KNEE ARTHROPLASTY;  Surgeon: [name], MD;  Location: [facility];  Service: Orthopedics;  Laterality: Right;  . TRANSURETHRAL RESECTION OF PROSTATE  2006   followed by "  surgery to get rid of clots"  . UVULOPALATOPHARYNGOPLASTY, TONSILLECTOMY AND SEPTOPLASTY  09/21/11   Deviated Septum    There were no vitals filed for this visit.   Subjective Assessment - 09/03/20 1148    Subjective Pt denies any new issues. Pt asking if we do anything with vertigo as he reports issues with it for some time. He reports that he feels it when looks down or brings head back up. She reports it does contribute to his falls as well. Feels hot when it hits him.    Pertinent History PMH of  Nonobstructive CAD  LVEF normal GERD, HTN, HL OSA, thoracic aneurysm  Mild 4.1 mm , arthritis, back surgery, left shoulder replacement, neck surgery, bilateral TKR, lower extremity edema    Patient Stated Goals Pt  would like to be able to walk better, perhaps without walker.    Currently in Pain? Yes    Pain Score 0-No pain   none at rest, increases in legs and back when up walking   Pain Location Back   and legs   Pain Orientation Lower    Pain Descriptors / Indicators Aching    Pain Type Chronic pain    Pain Onset More than a month ago    Pain Frequency Intermittent    Aggravating Factors  walking and standing longer time periods                             [facility] Adult PT Treatment/Exercise - 09/03/20 1150      Transfers   Transfers Sit to Stand;Stand to Sit    Sit to Stand 4: Min guard    Sit to Stand Details Verbal cues for technique    Sit to Stand Details (indicate cue type and reason) Pt was cued to push with hands from chair and to lean forward.    Stand to Sit 4: Min guard    Comments Pt performed sit to stand x 5 from mat with hands. Verbal cues to lean forward to help with rising more. Pt able to get more fluid motion as went on.      Ambulation/Gait   Ambulation/Gait Yes    Ambulation/Gait Assistance 5: Supervision    Ambulation/Gait Assistance Details Pt was given verbal cues to stay up in walker for more erect posture as well as try to increase right foot clearance and get some heel strike as tends to shuffle. Pt reports being tired after walking. HR=82    Ambulation Distance (Feet) 115 Feet   70' x 2 in/out of clinc   Assistive device Rolling walker    Gait Pattern Step-through pattern;Decreased step length - right;Decreased step length - left;Decreased dorsiflexion - right;Decreased dorsiflexion - left;Poor foot clearance - left;Poor foot clearance - right;Trunk flexed    Ambulation Surface Level;Indoor    Stairs Yes    Stairs Assistance 4: Min guard    Stairs Assistance Details (indicate cue type and reason) Pt reports that at home he goes 1 step at a time if has to carry something.    Stair Management Technique Two rails;Alternating pattern    Number of  Stairs 4      Exercises   Exercises Other Exercises    Other Exercises  Seated hip flexion x 10 bilateral, standing at walker: alternating toe taps on 4" step x 10 each side. In // bars: stepping over and back 2x4" bolster x 5 each leg then lateral stepping  over and back x 5 each foot. Pt was given cues for upright posture. Close SBA for safety.                  PT Education - 09/03/20 1257    Education Details Started on initial strengthening HEP. Discussed focusing on increasing foot clearance with gait.    Person(s) Educated Patient    Methods Explanation;Demonstration;Handout    Comprehension Verbalized understanding            PT Short Term Goals - 08/29/20 1815      PT SHORT TERM GOAL #1   Title Pt will be independent with HEP for strengthening and balance to continue gains on own.    Time 4    Period Weeks    Status New    Target Date 09/28/20      PT SHORT TERM GOAL #2   Title Pt will decrease 5 x sit to stand from 31.09 sec to <25 sec for improved balance and functional strength.    Baseline 08/29/20 31.09 sec from chair with hands    Time 4    Period Weeks    Status New    Target Date 09/28/20      PT SHORT TERM GOAL #3   Title Pt will ambulate >300' with RW mod I on level surfaces for improved household and short community distances.    Time 4    Period Weeks    Status New    Target Date 09/28/20      PT SHORT TERM GOAL #4   Title Pt will ambulate up/down 10 steps with railing mod I for improved safety with accessing home.    Time 4    Period Weeks    Status New    Target Date 09/28/20             PT Long Term Goals - 08/29/20 1826      PT LONG TERM GOAL #1   Title Pt will increase gait speed form 0.48m/s to >0.70m/s for improved gait safety.    Baseline 08/29/20 0.21m/s    Time 8    Period Weeks    Status New    Target Date 10/28/20      PT LONG TERM GOAL #2   Title Pt will ambulate >500' on varied surfaces supervision with RW for  improved short community distances.    Time 8    Period Weeks    Status New    Target Date 10/28/20      PT LONG TERM GOAL #3   Title Pt will decrease TUG from 44 sec to <38 sec for improved balance and functional mobility.    Baseline 08/29/20 44 sec    Time 8    Period Weeks    Status New    Target Date 10/28/20      PT LONG TERM GOAL #4   Title Pt will be able to tolerate > 8 minutes of exercises for improved activity tolerance.    Time 8    Period Weeks    Status New    Target Date 10/28/20                 Plan - 09/03/20 1258    Clinical Impression Statement PT focused on establishing initial strengthening HEP with pt. Trying to keep number of exercises limited and more functional so pt will be more likely to perform. Big focus was on increasing hip flexion to help with  foot clearance with gait. Pt is able to lift legs to touch step but has more shuffled steps with gait. Needs rest breaks periodically as fatigues quickly.    Personal Factors and Comorbidities Comorbidity 3+;Time since onset of injury/illness/exacerbation;Fitness    Comorbidities Nonobstructive CAD  LVEF normal GERD, HTN, HL OSA, thoracic aneurysm  Mild 4.1 mm , arthritis, back surgery, left shoulder replacement, neck surgery, bilateral TKR, lower extremity edema    Examination-Activity Limitations Locomotion Level;Transfers;Stairs;Stand    Examination-Participation Restrictions Art gallery manager;Yard Work    Stability/Clinical Decision Making Stable/Uncomplicated    Rehab Potential Good    PT Frequency 2x / week   plus eval   PT Duration 8 weeks    PT Treatment/Interventions ADLs/Self Care Home Management;DME Instruction;Gait training;Stair training;Functional mobility training;Therapeutic activities;Therapeutic exercise;Balance training;Neuromuscular re-education;Manual techniques;Patient/family education;Passive range of motion;Vestibular    PT Next Visit Plan How is initial HEP going?  gait  training with walker. Pt needs to stay up in walker more and work on foot clearance. I scheduled 4 weeks of 8 to see how it goes to begin. I scheduled pt with [facility] end of next week to do further vestibular assessment with pt reporting issues with vertigo.    Consulted and Agree with Plan of Care Patient           Patient will benefit from skilled therapeutic intervention in order to improve the following deficits and impairments:  Decreased activity tolerance,Decreased balance,Decreased mobility,Decreased strength,Increased edema,Decreased knowledge of use of DME,Decreased endurance,Decreased range of motion,Postural dysfunction,Pain  Visit Diagnosis: Other abnormalities of gait and mobility  Muscle weakness (generalized)  Unsteadiness on feet     Problem List Patient Active Problem List   Diagnosis Date Noted  . 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 Onychomycosis of toenail 09/05/2019  . Ataxia involving legs 09/05/2019  . Chronic hyponatremia 10/17/2018  . Benign prostatic hyperplasia without lower urinary tract symptoms 09/18/2018  . Prediabetes 09/18/2018  . Claudication of both lower extremities ([place]) 09/18/2018  . Hyperlipidemia LDL goal <70 09/18/2018  . Pure hyperglyceridemia 09/18/2018  . Routine general medical examination at a health care facility 09/18/2018  . GERD (gastroesophageal reflux disease)   . CAD (coronary artery disease)   . OSA (obstructive sleep apnea) 09/18/2015  . Mild concentric left ventricular hypertrophy (LVH) 09/18/2015  . 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[place]) 09/18/2015  . Hypothyroidism 09/18/2015  . Chronic idiopathic constipation 08/18/2015  . Primary localized osteoarthritis of right knee 08/08/2015  . Pulmonary nodule 06/13/2012  . CAD, NATIVE VESSEL 06/18/2009  . Essential hypertension 07/14/2007    Electa Sniff, PT, DPT, NCS 09/03/2020, 1:00 PM  [facility] [address]  [place], [place], [address] Phone: [phone]   Fax:  [phone]  Name: [name] [name] MRN: [id number] Date of Birth: [birth year]

## 2020-09-04 ENCOUNTER — Other Ambulatory Visit: Payer: Self-pay | Admitting: *Deleted

## 2020-09-04 DIAGNOSIS — M7989 Other specified soft tissue disorders: Secondary | ICD-10-CM

## 2020-09-05 ENCOUNTER — Ambulatory Visit: Payer: Medicare Other | Admitting: Physical Therapy

## 2020-09-09 ENCOUNTER — Other Ambulatory Visit: Payer: Self-pay

## 2020-09-09 ENCOUNTER — Ambulatory Visit: Payer: Medicare Other

## 2020-09-09 DIAGNOSIS — R2681 Unsteadiness on feet: Secondary | ICD-10-CM

## 2020-09-09 DIAGNOSIS — R2689 Other abnormalities of gait and mobility: Secondary | ICD-10-CM

## 2020-09-09 DIAGNOSIS — M6281 Muscle weakness (generalized): Secondary | ICD-10-CM

## 2020-09-09 NOTE — Therapy (Signed)
[facility] [address] [place], [place], [address] Phone: [phone]   Fax:  [phone]  Physical Therapy Treatment  Patient Details  Name: [name] [name] MRN: [id number] Date of Birth: [birth year] Referring Provider (PT): [name]   Encounter Date: 09/09/2020   PT End of Session - 09/09/20 1151    Visit Number 3    Number of Visits 17    Date for PT Re-Evaluation 11/27/20   60 day poc, 90 day cert   Authorization Type [employer] medicare so 10th visit progress note    PT Start Time 1149    PT Stop Time 1227    PT Time Calculation (min) 38 min    Equipment Utilized During Treatment Gait belt    Activity Tolerance Patient limited by fatigue    Behavior During Therapy [facility] for tasks assessed/performed           Past Medical History:  Diagnosis Date  . Arthritis   . CAD (coronary artery disease)    cath 3/10: mLAD 30%, pRCA 30%, EF 55-60% Dr. Harrington Challenger cardiologist  most recent  stress stress done ~ 2 years ago with Dr. Harrington Challenger  . Complication of anesthesia    "he usually gets an ileus after back OR"   . Depression   . Dyspnea    -PFTs compeltely normal 03/20/08 including DLc0  . Environmental allergies    Dust, Smoke  . GERD (gastroesophageal reflux disease)   . HTN (hypertension)   . Hypothyroidism   . Low testosterone   . Observed sleep apnea    can't wear cpap  . Polycythemia   . PONV (postoperative nausea and vomiting)   . Pulmonary nodule 06/13/2012  . Rosacea   . Sleep apnea    Most recent sleep study 2010; records at [facility]    Past Surgical History:  Procedure Laterality Date  . BACK SURGERY  2005; 06/2006; 01/2007; 04/2009  . CARDIAC CATHETERIZATION  08/2008  . CATARACT EXTRACTION, BILATERAL  2010  . CERVICAL FUSION  04/2002   ?C3-4  . COLONOSCOPY  12/2005   normal screening study.   . ESOPHAGOGASTRODUODENOSCOPY  multiple   last 07/2012 GERD esophagitis, 48 Fr dilation  .  ESOPHAGOGASTRODUODENOSCOPY N/A 09/19/2015   Procedure: ESOPHAGOGASTRODUODENOSCOPY (EGD);  Surgeon: [name], MD;  Location: [facility] ENDOSCOPY;  Service: Endoscopy;  Laterality: N/A;  . KNEE ARTHROSCOPY WITH MEDIAL MENISECTOMY Right 05/14/2015   Procedure: RIGHT KNEE ARTHROSCOPY CHONDROPLASTY, PARTIAL  MEDIAL MENISECTOMY;  Surgeon: [name], MD;  Location: [facility];  Service: Orthopedics;  Laterality: Right;  . left knee replacement    . left shoudler clean    . LUMBAR DISC SURGERY  06/2006   L3  . MENISCUS REPAIR  08/2008   left  . NECK SURGERY    . POSTERIOR FUSION LUMBAR SPINE  2005; 01/2007; 04/2009   L5; L3-4; L2-3  . PROSTATE SURGERY  2000  . SAVORY DILATION N/A 09/19/2015   Procedure: SAVORY DILATION;  Surgeon: [name], MD;  Location[facility] ENDOSCOPY;  Service: Endoscopy;  Laterality: N/A;  . SHOULDER OPEN ROTATOR CUFF REPAIR Left 1999   left  . TONSILLECTOMY  09/21/11  . TOTAL KNEE ARTHROPLASTY Left 01/2011   left  . TOTAL KNEE ARTHROPLASTY Right 08/08/2015   Procedure: RIGHT TOTAL KNEE ARTHROPLASTY;  Surgeon: [name], MD;  Location: [facility];  Service: Orthopedics;  Laterality: Right;  . TRANSURETHRAL RESECTION OF PROSTATE  2006   followed by "  surgery to get rid of clots"  . UVULOPALATOPHARYNGOPLASTY, TONSILLECTOMY AND SEPTOPLASTY  09/21/11   Deviated Septum    There were no vitals filed for this visit.   Subjective Assessment - 09/09/20 1152    Subjective Pt denies any new issues. Still "fat and sassy". Pt goes tomorrow for an ultrasound to check the size of his aneurysm.    Pertinent History PMH of  Nonobstructive CAD  LVEF normal GERD, HTN, HL OSA, thoracic aneurysm  Mild 4.1 mm , arthritis, back surgery, left shoulder replacement, neck surgery, bilateral TKR, lower extremity edema    Patient Stated Goals Pt would like to be able to walk better, perhaps without walker.    Currently in Pain? Yes    Pain Score 5     Pain Location Back    Pain  Orientation Lower    Pain Descriptors / Indicators Aching    Pain Onset More than a month ago    Pain Frequency Intermittent    Aggravating Factors  walking or standing longer time periods                             [facility] Adult PT Treatment/Exercise - 09/09/20 1153      Transfers   Transfers Sit to Stand;Stand to Sit    Sit to Stand 5: Supervision    Sit to Stand Details Verbal cues for technique    Stand to Sit 5: Supervision      Ambulation/Gait   Ambulation/Gait Yes    Ambulation/Gait Assistance 5: Supervision    Ambulation/Gait Assistance Details Pt was given verbal cues to stay up in walker and try to pick up feet more. Right foot still scuffing some but improved upright posture overall with better left foot clearance. Pt reported feeling a little winded after.    Ambulation Distance (Feet) 230 Feet    Assistive device Rolling walker    Gait Pattern Step-through pattern;Decreased step length - right;Decreased step length - left;Decreased dorsiflexion - right;Poor foot clearance - right    Ambulation Surface Level;Indoor      Neuro Re-ed    Neuro Re-ed Details  Standing with feet apart x 30 sec then with reaching in various directions x 30 sec, then feet together x 30 sec. Standing feet apart tossing 14 bean bags first with right hand then with left hand reaching across body without UE support close supervision for safety. Standing on airex: x 30 sec without UE support CGA/min assist with increased sway leaning posterior at times, marching on airex with hand support on walker x 10 CGA.      Exercises   Exercises Other Exercises    Other Exercises  Seated hip flexion x 10. Pt reported some pain in low back. Pt had episode of feeling dizzy just sitting and had to grab therapists leg to steady himself reporting his vision blurs out when occurs. HR was 80 and steady when occurred and passed within a few seconds. Sit to stands x 10 from mat with cues to push from mat.  Pt with much improved movement today with quicker rising.                    PT Short Term Goals - 08/29/20 1815      PT SHORT TERM GOAL #1   Title Pt will be independent with HEP for strengthening and balance to continue gains on own.    Time 4  Period Weeks    Status New    Target Date 09/28/20      PT SHORT TERM GOAL #2   Title Pt will decrease 5 x sit to stand from 31.09 sec to <25 sec for improved balance and functional strength.    Baseline 08/29/20 31.09 sec from chair with hands    Time 4    Period Weeks    Status New    Target Date 09/28/20      PT SHORT TERM GOAL #3   Title Pt will ambulate >300' with RW mod I on level surfaces for improved household and short community distances.    Time 4    Period Weeks    Status New    Target Date 09/28/20      PT SHORT TERM GOAL #4   Title Pt will ambulate up/down 10 steps with railing mod I for improved safety with accessing home.    Time 4    Period Weeks    Status New    Target Date 09/28/20             PT Long Term Goals - 08/29/20 1826      PT LONG TERM GOAL #1   Title Pt will increase gait speed form 0.99m/s to >0.15m/s for improved gait safety.    Baseline 08/29/20 0.24m/s    Time 8    Period Weeks    Status New    Target Date 10/28/20      PT LONG TERM GOAL #2   Title Pt will ambulate >500' on varied surfaces supervision with RW for improved short community distances.    Time 8    Period Weeks    Status New    Target Date 10/28/20      PT LONG TERM GOAL #3   Title Pt will decrease TUG from 44 sec to <38 sec for improved balance and functional mobility.    Baseline 08/29/20 44 sec    Time 8    Period Weeks    Status New    Target Date 10/28/20      PT LONG TERM GOAL #4   Title Pt will be able to tolerate > 8 minutes of exercises for improved activity tolerance.    Time 8    Period Weeks    Status New    Target Date 10/28/20                 Plan - 09/09/20 1842    Clinical  Impression Statement Pt was able to demonstrate improved ability to stay up in walker with better left foot clearance today. He continues to scuff right foot some. Improved sit to stand with less UE support today. Pt had one episode of the dizziness he was reporting just when sitting on mat. Checked HR and was normal and regular but symptoms he is reporting do seem to be more cardiac related.    Personal Factors and Comorbidities Comorbidity 3+;Time since onset of injury/illness/exacerbation;Fitness    Comorbidities Nonobstructive CAD  LVEF normal GERD, HTN, HL OSA, thoracic aneurysm  Mild 4.1 mm , arthritis, back surgery, left shoulder replacement, neck surgery, bilateral TKR, lower extremity edema    Examination-Activity Limitations Locomotion Level;Transfers;Stairs;Stand    Examination-Participation Restrictions Art gallery manager;Yard Work    Stability/Clinical Decision Making Stable/Uncomplicated    Rehab Potential Good    PT Frequency 2x / week   plus eval   PT Duration 8 weeks    PT Treatment/Interventions ADLs/Self  Care Home Management;DME Instruction;Gait training;Stair training;Functional mobility training;Therapeutic activities;Therapeutic exercise;Balance training;Neuromuscular re-education;Manual techniques;Patient/family education;Passive range of motion;Vestibular    PT Next Visit Plan gait training with walker. Pt needs to stay up in walker more and work on foot clearance. Work on hip strengthening and balance acivities. Pt does need frequent rest breaks.    Consulted and Agree with Plan of Care Patient           Patient will benefit from skilled therapeutic intervention in order to improve the following deficits and impairments:  Decreased activity tolerance,Decreased balance,Decreased mobility,Decreased strength,Increased edema,Decreased knowledge of use of DME,Decreased endurance,Decreased range of motion,Postural dysfunction,Pain  Visit Diagnosis: Other abnormalities  of gait and mobility  Muscle weakness (generalized)  Unsteadiness on feet     Problem List Patient Active Problem List   Diagnosis Date Noted  . 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 Onychomycosis of toenail 09/05/2019  . Ataxia involving legs 09/05/2019  . Chronic hyponatremia 10/17/2018  . Benign prostatic hyperplasia without lower urinary tract symptoms 09/18/2018  . Prediabetes 09/18/2018  . Claudication of both lower extremities ([place]) 09/18/2018  . Hyperlipidemia LDL goal <70 09/18/2018  . Pure hyperglyceridemia 09/18/2018  . Routine general medical examination at a health care facility 09/18/2018  . GERD (gastroesophageal reflux disease)   . CAD (coronary artery disease)   . OSA (obstructive sleep apnea) 09/18/2015  . Mild concentric left ventricular hypertrophy (LVH) 09/18/2015  . 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[place]) 09/18/2015  . Hypothyroidism 09/18/2015  . Chronic idiopathic constipation 08/18/2015  . Primary localized osteoarthritis of right knee 08/08/2015  . Pulmonary nodule 06/13/2012  . CAD, NATIVE VESSEL 06/18/2009  . Essential hypertension 07/14/2007    Electa Sniff, PT, DPT, NCS 09/09/2020, 6:46 PM  [facility] [address] [place], [place], [address] Phone: [phone]   Fax:  [phone]  Name: [name] [name] MRN: [id number] Date of Birth: 1945-11-28

## 2020-09-10 ENCOUNTER — Other Ambulatory Visit: Payer: Medicare Other | Admitting: *Deleted

## 2020-09-10 ENCOUNTER — Ambulatory Visit (HOSPITAL_COMMUNITY): Payer: Medicare Other | Attending: Cardiology

## 2020-09-10 DIAGNOSIS — I10 Essential (primary) hypertension: Secondary | ICD-10-CM | POA: Insufficient documentation

## 2020-09-10 DIAGNOSIS — M7989 Other specified soft tissue disorders: Secondary | ICD-10-CM

## 2020-09-10 DIAGNOSIS — I2510 Atherosclerotic heart disease of native coronary artery without angina pectoris: Secondary | ICD-10-CM | POA: Insufficient documentation

## 2020-09-10 LAB — ECHOCARDIOGRAM COMPLETE
AR max vel: 3.83 cm2
AV Area VTI: 3.18 cm2
AV Area mean vel: 3.26 cm2
AV Mean grad: 6 mmHg
AV Peak grad: 10.5 mmHg
Ao pk vel: 1.62 m/s
Area-P 1/2: 2.71 cm2
S' Lateral: 2.6 cm

## 2020-09-10 LAB — BASIC METABOLIC PANEL
BUN/Creatinine Ratio: 22 (ref 10–24)
BUN: 18 mg/dL (ref 8–27)
CO2: 26 mmol/L (ref 20–29)
Calcium: 9.6 mg/dL (ref 8.6–10.2)
Chloride: 98 mmol/L (ref 96–106)
Creatinine, Ser: 0.81 mg/dL (ref 0.76–1.27)
Glucose: 86 mg/dL (ref 65–99)
Potassium: 4.3 mmol/L (ref 3.5–5.2)
Sodium: 139 mmol/L (ref 134–144)
eGFR: 93 mL/min/{1.73_m2} (ref >59)

## 2020-09-12 ENCOUNTER — Other Ambulatory Visit: Payer: Self-pay

## 2020-09-12 ENCOUNTER — Encounter: Payer: Self-pay | Admitting: Physical Therapy

## 2020-09-12 ENCOUNTER — Ambulatory Visit: Payer: Medicare Other | Admitting: Physical Therapy

## 2020-09-12 DIAGNOSIS — M6281 Muscle weakness (generalized): Secondary | ICD-10-CM

## 2020-09-12 DIAGNOSIS — R2689 Other abnormalities of gait and mobility: Secondary | ICD-10-CM

## 2020-09-12 DIAGNOSIS — R2681 Unsteadiness on feet: Secondary | ICD-10-CM

## 2020-09-12 NOTE — Therapy (Signed)
[facility] [address] [place], [place], [address] Phone: [phone]   Fax:  [phone]  Physical Therapy Treatment  Patient Details  Name: LONNY EISEN MRN: [id number] Date of Birth: 11/09/1945 Referring Provider (PT): [name]   Encounter Date: 09/12/2020   PT End of Session - 09/12/20 1104    Visit Number 4    Number of Visits 17    Date for PT Re-Evaluation 11/27/20   60 day poc, 21 day cert   Authorization Type [employer] medicare so 10th visit progress note    PT Start Time 1101    PT Stop Time 1144    PT Time Calculation (min) 43 min    Equipment Utilized During Treatment Gait belt    Activity Tolerance Patient limited by fatigue;Patient tolerated treatment well    Behavior During Therapy [facility] for tasks assessed/performed           Past Medical History:  Diagnosis Date  . Arthritis   . CAD (coronary artery disease)    cath 3/10: mLAD 30%, pRCA 30%, EF 55-60% Dr. Harrington Challenger cardiologist  most recent  stress stress done ~ 2 years ago with Dr. Harrington Challenger  . Complication of anesthesia    "he usually gets an ileus after back OR"   . Depression   . Dyspnea    -PFTs compeltely normal 03/20/08 including DLc0  . Environmental allergies    Dust, Smoke  . GERD (gastroesophageal reflux disease)   . HTN (hypertension)   . Hypothyroidism   . Low testosterone   . Observed sleep apnea    can't wear cpap  . Polycythemia   . PONV (postoperative nausea and vomiting)   . Pulmonary nodule 06/13/2012  . Rosacea   . Sleep apnea    Most recent sleep study 2010; records at [facility]    Past Surgical History:  Procedure Laterality Date  . BACK SURGERY  2005; 06/2006; 01/2007; 04/2009  . CARDIAC CATHETERIZATION  08/2008  . CATARACT EXTRACTION, BILATERAL  2010  . CERVICAL FUSION  04/2002   ?C3-4  . COLONOSCOPY  12/2005   normal screening study.   . ESOPHAGOGASTRODUODENOSCOPY  multiple   last 07/2012 GERD esophagitis, 49  Fr dilation  . ESOPHAGOGASTRODUODENOSCOPY N/A 09/19/2015   Procedure: ESOPHAGOGASTRODUODENOSCOPY (EGD);  Surgeon: [name], MD;  Location: [facility] ENDOSCOPY;  Service: Endoscopy;  Laterality: N/A;  . KNEE ARTHROSCOPY WITH MEDIAL MENISECTOMY Right 05/14/2015   Procedure: RIGHT KNEE ARTHROSCOPY CHONDROPLASTY, PARTIAL  MEDIAL MENISECTOMY;  Surgeon: [name], MD;  Location: [facility];  Service: Orthopedics;  Laterality: Right;  . left knee replacement    . left shoudler clean    . LUMBAR DISC SURGERY  06/2006   L3  . MENISCUS REPAIR  08/2008   left  . NECK SURGERY    . POSTERIOR FUSION LUMBAR SPINE  2005; 01/2007; 04/2009   L5; L3-4; L2-3  . PROSTATE SURGERY  2000  . SAVORY DILATION N/A 09/19/2015   Procedure: SAVORY DILATION;  Surgeon: [name], MD;  Location: [facility] ENDOSCOPY;  Service: Endoscopy;  Laterality: N/A;  . SHOULDER OPEN ROTATOR CUFF REPAIR Left 1999   left  . TONSILLECTOMY  09/21/11  . TOTAL KNEE ARTHROPLASTY Left 01/2011   left  . TOTAL KNEE ARTHROPLASTY Right 08/08/2015   Procedure: RIGHT TOTAL KNEE ARTHROPLASTY;  Surgeon: [name], MD;  Location: [facility];  Service: Orthopedics;  Laterality: Right;  . TRANSURETHRAL RESECTION OF PROSTATE  2006  followed by "surgery to get rid of clots"  . UVULOPALATOPHARYNGOPLASTY, TONSILLECTOMY AND SEPTOPLASTY  09/21/11   Deviated Septum    There were no vitals filed for this visit.   Subjective Assessment - 09/12/20 1103    Subjective No new complaints. No falls or pain to report.    Pertinent History PMH of  Nonobstructive CAD  LVEF normal GERD, HTN, HL OSA, thoracic aneurysm  Mild 4.1 mm , arthritis, back surgery, left shoulder replacement, neck surgery, bilateral TKR, lower extremity edema    Patient Stated Goals Pt would like to be able to walk better, perhaps without walker.    Currently in Pain? No/denies    Pain Score 0-No pain                 [facility] Adult PT Treatment/Exercise - 09/12/20 1105       Transfers   Transfers Sit to Stand;Stand to Sit    Sit to Stand 5: Supervision;With upper extremity assist;From bed;From chair/3-in-1    Stand to Sit 5: Supervision;With upper extremity assist;To bed;To chair/3-in-1      Ambulation/Gait   Ambulation/Gait Yes    Ambulation/Gait Assistance 5: Supervision    Ambulation Distance (Feet) 100 Feet   x2   Assistive device Rolling walker    Gait Pattern Step-through pattern;Decreased step length - right;Decreased step length - left;Decreased dorsiflexion - right;Poor foot clearance - right    Ambulation Surface Level;Indoor      Neuro Re-ed    Neuro Re-ed Details  for balance/muscle re-ed: feet apart no UE support for EO 30 sec's x 3 reps, min guard to min assist for balance; progressed to EC 30 sec's x 3 reps with min guard to min assist. Pt with posterior balance loss preference needing cues/assist to correct; standing with RW support: alternating forward/backward stepping over black bolster for 5 reps each side, 2 sets. Min guard assist for balance with assist to stabilize RW. cues for increased height/length to clear bolsters with stepping.      Exercises   Exercises Other Exercises    Other Exercises  seated at edge of mat: with 2# ankle weights on bil LE'S- alternating marching, long arc quads for 2 sets of 10 reps each; then with red band resistance- hamstring curls (pt sliding foot on floor with pillow case underneath foot) for 2 sets of 10 reps each side. cues for slow and controlled movements with all ex's; in standing with bil UE support on sturdy surface: heel raises for 10 reps with cues on posture, decreased lift height noted on bil LE's. Heavy UE reliance needed.                 PT Short Term Goals - 08/29/20 1815      PT SHORT TERM GOAL #1   Title Pt will be independent with HEP for strengthening and balance to continue gains on own.    Time 4    Period Weeks    Status New    Target Date 09/28/20      PT SHORT TERM  GOAL #2   Title Pt will decrease 5 x sit to stand from 31.09 sec to <25 sec for improved balance and functional strength.    Baseline 08/29/20 31.09 sec from chair with hands    Time 4    Period Weeks    Status New    Target Date 09/28/20      PT SHORT TERM GOAL #3   Title Pt will ambulate >  300' with RW mod I on level surfaces for improved household and short community distances.    Time 4    Period Weeks    Status New    Target Date 09/28/20      PT SHORT TERM GOAL #4   Title Pt will ambulate up/down 10 steps with railing mod I for improved safety with accessing home.    Time 4    Period Weeks    Status New    Target Date 09/28/20             PT Long Term Goals - 08/29/20 1826      PT LONG TERM GOAL #1   Title Pt will increase gait speed form 0.57m/s to >0.51m/s for improved gait safety.    Baseline 08/29/20 0.9m/s    Time 8    Period Weeks    Status New    Target Date 10/28/20      PT LONG TERM GOAL #2   Title Pt will ambulate >500' on varied surfaces supervision with RW for improved short community distances.    Time 8    Period Weeks    Status New    Target Date 10/28/20      PT LONG TERM GOAL #3   Title Pt will decrease TUG from 44 sec to <38 sec for improved balance and functional mobility.    Baseline 08/29/20 44 sec    Time 8    Period Weeks    Status New    Target Date 10/28/20      PT LONG TERM GOAL #4   Title Pt will be able to tolerate > 8 minutes of exercises for improved activity tolerance.    Time 8    Period Weeks    Status New    Target Date 10/28/20                 Plan - 09/12/20 1104    Clinical Impression Statement Today's skilled session continued to focus on LE strengthing, standing balance and gait with RW. Pt's HR WNL's with session. No episodes of dizziness reported with session. The pt is scheduled to see a vestibular PT on 09/24/20 to further assess vestibular system if it remains warrented. The pt is progressing toward goals  and should benefit from continued PT to progress toward unmet goals.    Personal Factors and Comorbidities Comorbidity 3+;Time since onset of injury/illness/exacerbation;Fitness    Comorbidities Nonobstructive CAD  LVEF normal GERD, HTN, HL OSA, thoracic aneurysm  Mild 4.1 mm , arthritis, back surgery, left shoulder replacement, neck surgery, bilateral TKR, lower extremity edema    Examination-Activity Limitations Locomotion Level;Transfers;Stairs;Stand    Examination-Participation Restrictions Art gallery manager;Yard Work    Stability/Clinical Decision Making Stable/Uncomplicated    Rehab Potential Good    PT Frequency 2x / week   plus eval   PT Duration 8 weeks    PT Treatment/Interventions ADLs/Self Care Home Management;DME Instruction;Gait training;Stair training;Functional mobility training;Therapeutic activities;Therapeutic exercise;Balance training;Neuromuscular re-education;Manual techniques;Patient/family education;Passive range of motion;Vestibular    PT Next Visit Plan gait training with walker. Pt needs to stay up in walker more and work on foot clearance. Work on hip strengthening and balance acivities. Pt does need frequent rest breaks.    Consulted and Agree with Plan of Care Patient           Patient will benefit from skilled therapeutic intervention in order to improve the following deficits and impairments:  Decreased activity tolerance,Decreased balance,Decreased mobility,Decreased strength,Increased  edema,Decreased knowledge of use of DME,Decreased endurance,Decreased range of motion,Postural dysfunction,Pain  Visit Diagnosis: Other abnormalities of gait and mobility  Muscle weakness (generalized)  Unsteadiness on feet     Problem List Patient Active Problem List   Diagnosis Date Noted  . 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 Onychomycosis of toenail 09/05/2019  . Ataxia involving legs 09/05/2019  . Chronic hyponatremia 10/17/2018  . Benign  prostatic hyperplasia without lower urinary tract symptoms 09/18/2018  . Prediabetes 09/18/2018  . Claudication of both lower extremities ([place]) 09/18/2018  . Hyperlipidemia LDL goal <70 09/18/2018  . Pure hyperglyceridemia 09/18/2018  . Routine general medical examination at a health care facility 09/18/2018  . GERD (gastroesophageal reflux disease)   . CAD (coronary artery disease)   . OSA (obstructive sleep apnea) 09/18/2015  . Mild concentric left ventricular hypertrophy (LVH) 09/18/2015  . 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[place]) 09/18/2015  . Hypothyroidism 09/18/2015  . Chronic idiopathic constipation 08/18/2015  . Primary localized osteoarthritis of right knee 08/08/2015  . Pulmonary nodule 06/13/2012  . CAD, NATIVE VESSEL 06/18/2009  . Essential hypertension 07/14/2007    Willow Ora, PTA, [facility] [address], [place], [place] [address] [phone] 09/12/20, 1:48 PM   Name: BROOKE PAYES MRN: [id number] Date of Birth: September 15, 1945

## 2020-09-17 ENCOUNTER — Ambulatory Visit: Payer: Medicare Other

## 2020-09-18 ENCOUNTER — Telehealth: Payer: Self-pay | Admitting: Internal Medicine

## 2020-09-18 NOTE — Telephone Encounter (Signed)
LVM for pt to rtn my call to R/S appt with [facility] on 10/03/20. Please R/S if pt calls the office

## 2020-09-19 ENCOUNTER — Ambulatory Visit: Payer: Medicare Other | Admitting: Physical Therapy

## 2020-09-22 ENCOUNTER — Other Ambulatory Visit: Payer: Self-pay

## 2020-09-22 ENCOUNTER — Encounter: Payer: Self-pay | Admitting: Internal Medicine

## 2020-09-22 ENCOUNTER — Ambulatory Visit: Payer: Medicare Other | Admitting: Internal Medicine

## 2020-09-22 VITALS — BP 124/68 | HR 65 | Temp 98.4°F | Resp 16 | Ht 70.0 in | Wt 277.0 lb

## 2020-09-22 DIAGNOSIS — E039 Hypothyroidism, unspecified: Secondary | ICD-10-CM | POA: Diagnosis not present

## 2020-09-22 DIAGNOSIS — R7303 Prediabetes: Secondary | ICD-10-CM | POA: Diagnosis not present

## 2020-09-22 DIAGNOSIS — N400 Enlarged prostate without lower urinary tract symptoms: Secondary | ICD-10-CM | POA: Diagnosis not present

## 2020-09-22 DIAGNOSIS — E781 Pure hyperglyceridemia: Secondary | ICD-10-CM

## 2020-09-22 DIAGNOSIS — E785 Hyperlipidemia, unspecified: Secondary | ICD-10-CM | POA: Diagnosis not present

## 2020-09-22 DIAGNOSIS — I700 Atherosclerosis of aorta: Secondary | ICD-10-CM

## 2020-09-22 DIAGNOSIS — I10 Essential (primary) hypertension: Secondary | ICD-10-CM | POA: Diagnosis not present

## 2020-09-22 LAB — LIPID PANEL
Cholesterol: 116 mg/dL (ref 0–200)
HDL: 33.5 mg/dL — ABNORMAL LOW (ref >39.00)
LDL Cholesterol: 58 mg/dL (ref 0–99)
NonHDL: 82.49
Total CHOL/HDL Ratio: 3
Triglycerides: 122 mg/dL (ref 0.0–149.0)
VLDL: 24.4 mg/dL (ref 0.0–40.0)

## 2020-09-22 LAB — BASIC METABOLIC PANEL
BUN: 21 mg/dL (ref 6–23)
CO2: 27 mEq/L (ref 19–32)
Calcium: 10.1 mg/dL (ref 8.4–10.5)
Chloride: 99 mEq/L (ref 96–112)
Creatinine, Ser: 0.78 mg/dL (ref 0.40–1.50)
GFR: 88.35 mL/min (ref >60.00)
Glucose, Bld: 107 mg/dL — ABNORMAL HIGH (ref 70–99)
Potassium: 4.4 mEq/L (ref 3.5–5.1)
Sodium: 136 mEq/L (ref 135–145)

## 2020-09-22 LAB — HEMOGLOBIN A1C: Hgb A1c MFr Bld: 6.3 % (ref 4.6–6.5)

## 2020-09-22 LAB — TSH: TSH: 0.44 u[IU]/mL (ref 0.35–4.50)

## 2020-09-22 LAB — PSA: PSA: 0.35 ng/mL (ref 0.10–4.00)

## 2020-09-22 NOTE — Progress Notes (Signed)
Subjective:  Patient ID: [name] [name], male    DOB: Nov 08, 1945  Age: 75 y.o. MRN: [id number]  CC: Hypertension, Hypothyroidism, and Hyperlipidemia  This visit occurred during the SARS-CoV-2 public health emergency.  Safety protocols were in place, including screening questions prior to the visit, additional usage of staff PPE, and extensive cleaning of exam room while observing appropriate contact time as indicated for disinfecting solutions.    HPI [name] [name] presents for f/up -  He feels like his thyroid dosage as is adequate.  He offers no new symptoms.  Outpatient Medications Prior to Visit  Medication Sig Dispense Refill  . aspirin EC 81 MG tablet Take 1 tablet (81 mg total) by mouth 2 (two) times daily.    . cholecalciferol (VITAMIN D3) 25 MCG (1000 UNIT) tablet Take 2,000 Units by mouth daily.    . DULoxetine (CYMBALTA) 30 MG capsule TAKE 1 CAPSULE BY MOUTH  DAILY 90 capsule 1  . furosemide (LASIX) 40 MG tablet Take 1 tablet (40 mg total) by mouth daily. 90 tablet 3  . gabapentin (NEURONTIN) 300 MG capsule TAKE 1 CAPSULE BY MOUTH TWICE PER DAY    . levothyroxine (SYNTHROID) 150 MCG tablet TAKE 1 TABLET BY MOUTH  DAILY BEFORE BREAKFAST 90 tablet 1  . losartan-hydrochlorothiazide (HYZAAR) 100-25 MG tablet Take 0.5 tablets by mouth daily. 45 tablet 3  . metoprolol succinate (TOPROL-XL) 25 MG 24 hr tablet TAKE 1 TABLET BY MOUTH  DAILY 90 tablet 3  . Multiple Vitamins-Minerals (ICAPS AREDS 2 PO) Take 1 capsule by mouth daily.    [name] omeprazole (PRILOSEC) 40 MG capsule TAKE 1 CAPSULE BY MOUTH  DAILY 30 MINUTES BEFORE  BREAKFAST 90 capsule 1  . polyethylene glycol (MIRALAX / GLYCOLAX) 17 g packet Take 17 g by mouth daily.    . potassium chloride SA (KLOR-CON) 20 MEQ tablet Take 1 tablet (20 mEq total) by mouth daily. 90 tablet 3  . simvastatin (ZOCOR) 20 MG tablet TAKE 1 TABLET BY MOUTH  DAILY AT 6 PM. 90 tablet 1  . UNABLE TO FIND Mega red 1000mg  one per day     No  facility-administered medications prior to visit.    ROS Review of Systems  Constitutional: Negative for diaphoresis and fatigue.  HENT: Negative.   Eyes: Negative for visual disturbance.  Respiratory: Negative for cough, chest tightness and shortness of breath.   Cardiovascular: Negative for chest pain, palpitations and leg swelling.  Gastrointestinal: Negative for abdominal pain, constipation, diarrhea and vomiting.  Endocrine: Negative for cold intolerance and heat intolerance.  Genitourinary: Negative.  Negative for difficulty urinating.  Musculoskeletal: Positive for gait problem. Negative for arthralgias, back pain and myalgias.  Skin: Negative.   Neurological: Negative for dizziness, weakness and light-headedness.  Hematological: Negative for adenopathy. Does not bruise/bleed easily.    Objective:  There were no vitals taken for this visit.  BP Readings from Last 3 Encounters:  08/29/20 122/70  08/28/20 (!) 100/48  08/15/20 (!) 148/64    Wt Readings from Last 3 Encounters:  08/28/20 278 lb 12.8 oz (126.5 kg)  08/15/20 281 lb 12.8 oz (127.8 kg)  05/12/20 283 lb 6.4 oz (128.5 kg)    Physical Exam Vitals reviewed.  Constitutional:      Appearance: He is obese.  HENT:     Nose: Nose normal.     Mouth/Throat:     Mouth: Mucous membranes are moist.  Eyes:     General: No scleral icterus.    Conjunctiva/sclera:  Conjunctivae normal.  Cardiovascular:     Rate and Rhythm: Normal rate and regular rhythm.     Heart sounds: No murmur heard.   Pulmonary:     Effort: Pulmonary effort is normal.     Breath sounds: No stridor. No wheezing, rhonchi or rales.  Abdominal:     General: Abdomen is protuberant. Bowel sounds are normal.     Palpations: There is no hepatomegaly, splenomegaly or mass.  Musculoskeletal:        General: Normal range of motion.     Cervical back: Neck supple.     Right lower leg: No edema.     Left lower leg: No edema.  Lymphadenopathy:      Cervical: No cervical adenopathy.  Skin:    General: Skin is warm and dry.  Neurological:     General: No focal deficit present.     Mental Status: He is alert.  Psychiatric:        Mood and Affect: Mood normal.        Behavior: Behavior normal.     Lab Results  Component Value Date   WBC 5.8 05/12/2020   HGB 14.7 05/12/2020   HCT 43.4 05/12/2020   PLT 211 05/12/2020   GLUCOSE 107 (H) 09/22/2020   CHOL 116 09/22/2020   TRIG 122.0 09/22/2020   HDL 33.50 (L) 09/22/2020   LDLCALC 58 09/22/2020   ALT 24 09/05/2019   AST 25 09/05/2019   NA 136 09/22/2020   K 4.4 09/22/2020   CL 99 09/22/2020   CREATININE 0.78 09/22/2020   BUN 21 09/22/2020   CO2 27 09/22/2020   TSH 0.44 09/22/2020   PSA 0.35 09/22/2020   INR 1.03 07/29/2015   HGBA1C 6.3 09/22/2020    MR Cervical Spine Wo Contrast  Result Date: 02/04/2019  [facility] [address], [place], [place] [address] [phone] NEUROIMAGING REPORT STUDY DATE: 02/02/2019 PATIENT NAME: [name] [name] DOB: 27-Sep-1945 MRN: [id number] EXAM: MRI of the cervical spine ORDERING CLINICIAN: Antony Contras, MD CLINICAL HISTORY: 75 year old man with cervical radiculopathy COMPARISON FILMS: MRI 02/22/2018 TECHNIQUE: MRI of the cervical spine was obtained utilizing 3 mm sagittal slices from the posterior fossa down to the T3-4 level with T1, T2 and inversion recovery views. In addition 4 mm axial slices from Y7-0 down to T1-2 level were included with T2 and gradient echo views. CONTRAST: None IMAGING SITE: [facility], [place], [place], [place] FINDINGS: :  On sagittal images, the spine is imaged from above the cervicomedullary junction to T2.   The spinal cord is of normal caliber.  There is T2 hyperintense signal within the cord adjacent to C3-C4 consistent with chronic mild low malacia, stable compared to the previous MRI.[name]  There is a solid fusion at C3-C4 associated with ACDF hardware.  Reduced disc height is noted  at C4-C5, C6-C7 and C7-T1.  The vertebral bodies are normally aligned.   The vertebral bodies have normal signal.  The discs and interspaces were further evaluated on axial views from C2 to T1 as follows: C2-C3: There is a small right disc osteophyte complex causing mild right foraminal narrowing but no nerve root compression or spinal stenosis. C3-C4: This level is fused.  There is a right paramedian disc osteophyte complex and right facet hypertrophy causing moderate right foraminal narrowing.  There is also mild left foraminal narrowing.  No nerve root compression or spinal stenosis. C4-C5: There are disc osteophyte complexes and facet hypertrophy.  This causes  mild spinal stenosis and moderate bilateral foraminal narrowing. C5-C6: There are disc osteophyte complexes, right greater than left causing mild spinal stenosis, severe right foraminal narrowing and moderate left foraminal narrowing. C6-C7: There is disc protrusion and uncovertebral spurring and ligamenta flava hypertrophy causing mild spinal stenosis and moderate bilateral foraminal narrowing. C7-T1: There is a right disc osteophyte complex, ligamenta flava hypertrophy and left greater than right facet hypertrophy.  There is no spinal stenosis but there is progressed moderate right and stable mild to moderate left foraminal narrowing. T1-T2: There is disc protrusion and ligamenta flava hypertrophy causing borderline spinal stenosis and mild to moderate foraminal narrowing. When compared to the MRI dated 02/22/2018, changes at C7-T1 have progressed with more foraminal narrowing to the right on the current study.   This MRI of the cervical spine without contrast shows the following: 1.   At C3-C4, there is stable ACDF.  There is moderate right foraminal narrowing but no definite nerve root compression or spinal stenosis. 2.   At C4-C5, there are stable degenerative changes causing stable mild spinal stenosis and moderate foraminal narrowing but no  definite nerve root compression.. 3.   At C5-C6, there are stable degenerative changes causing mild spinal stenosis and severe right foraminal narrowing with potential for right C6 nerve root compression 4.   At C6-C7, there are stable degenerative changes causing mild spinal stenosis and moderate foraminal narrowing but no definite nerve root compression. 5.   At C7-T1, there are progressive degenerative changes causing moderate right foraminal narrowing and moderate left foraminal narrowing.  There is no definite nerve root compression though there is more encroachment upon the right C8 nerve root compression than was present on the previous MRI. 6.   At T1-T2, there are degenerative changes causing borderline spinal stenosis but no nerve root compression. 7.   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 Felecia Shelling, MD, PhD, FAAN Certified in  Neuroimaging by [employer]    Assessment & Plan:   [name] [name] was seen today for hypertension, hypothyroidism and hyperlipidemia.  Diagnoses and all orders for this visit:  Atherosclerosis of aorta ([place])- Risk factor modifications addressed. -     Lipid panel; Future -     Lipid panel  Acquired hypothyroidism- His TSH is in the normal range.  He will stay on the current dose of levothyroxine. -     TSH; Future -     TSH  Essential hypertension- His blood pressure is adequately well controlled. -     Basic metabolic panel; Future -     Basic metabolic panel  Benign prostatic hyperplasia without lower urinary tract symptoms- His PSA is low which is a reassuring sign that he does not have prostate cancer. -     PSA; Future -     PSA  Prediabetes- His A1c is at 6.3%.  Medical therapy is not yet indicated. -     Basic metabolic panel; Future -     Hemoglobin A1c; Future -     Hemoglobin A1c -     Basic metabolic panel  Pure hyperglyceridemia  Hyperlipidemia LDL  goal <70- He has achieved his LDL goal and is doing well on the statin. -     Lipid panel; Future -     TSH; Future -     TSH -     Lipid panel   I am having Anshul D. Googe maintain his  aspirin EC, gabapentin, polyethylene glycol, UNABLE TO FIND, cholecalciferol, metoprolol succinate, omeprazole, DULoxetine, levothyroxine, simvastatin, losartan-hydrochlorothiazide, Multiple Vitamins-Minerals (ICAPS AREDS 2 PO), furosemide, and potassium chloride SA.  No orders of the defined types were placed in this encounter.    Follow-up: Return in about 6 months (around 03/25/2021).  Scarlette Calico, MD

## 2020-09-22 NOTE — Patient Instructions (Signed)

## 2020-09-24 ENCOUNTER — Ambulatory Visit: Payer: Medicare Other

## 2020-09-26 ENCOUNTER — Ambulatory Visit: Payer: Medicare Other

## 2020-10-03 ENCOUNTER — Ambulatory Visit: Payer: Medicare Other

## 2020-10-06 ENCOUNTER — Telehealth: Payer: Self-pay | Admitting: Internal Medicine

## 2020-10-06 ENCOUNTER — Other Ambulatory Visit: Payer: Self-pay

## 2020-10-06 ENCOUNTER — Ambulatory Visit (INDEPENDENT_AMBULATORY_CARE_PROVIDER_SITE_OTHER): Payer: Medicare Other

## 2020-10-06 VITALS — BP 124/80 | HR 78 | Temp 98.4°F | Ht 70.0 in | Wt 274.0 lb

## 2020-10-06 DIAGNOSIS — Z0000 Encounter for general adult medical examination without abnormal findings: Secondary | ICD-10-CM

## 2020-10-06 NOTE — Patient Instructions (Signed)
[name] [name] , Thank you for taking time to come for your Medicare Wellness Visit. I appreciate your ongoing commitment to your health goals. Please review the following plan we discussed and let me know if I can assist you in the future.   Screening recommendations/referrals: Colonoscopy: 01/20/2016; due every 10 years  Recommended yearly ophthalmology/optometry visit for glaucoma screening and checkup Recommended yearly dental visit for hygiene and checkup  Vaccinations: Influenza vaccine: 02/26/2020 Pneumococcal vaccine: 07/03/2014, 03/08/2019 Tdap vaccine: 09/18/2018; due every 10 years Shingles vaccine: 02/23/2018, 05/03/2018   Covid-19: declined  Advanced directives: Advance directive discussed with you today. Even though you declined this today please call our office should you change your mind and we can give you the proper paperwork for you to fill out.  Conditions/risks identified: Yes; Reviewed health maintenance screenings with patient today and relevant education, vaccines, and/or referrals were provided. Please continue to do your personal lifestyle choices by: daily care of teeth and gums, regular physical activity (goal should be 5 days a week for 30 minutes), eat a healthy diet, avoid tobacco and drug use, limiting any alcohol intake, taking a low-dose aspirin (if not allergic or have been advised by your provider otherwise) and taking vitamins and minerals as recommended by your provider. Continue doing brain stimulating activities (puzzles, reading, adult coloring books, staying active) to keep memory sharp. Continue to eat heart healthy diet (full of fruits, vegetables, whole grains, lean protein, water--limit salt, fat, and sugar intake) and increase physical activity as tolerated.  Next appointment: Please schedule your next Medicare Wellness Visit with your Nurse Health Advisor in 1 year by calling [phone].  Preventive Care 74 Years and Older, Male Preventive care refers to  lifestyle choices and visits with your health care provider that can promote health and wellness. What does preventive care include?  A yearly physical exam. This is also called an annual well check.  Dental exams once or twice a year.  Routine eye exams. Ask your health care provider how often you should have your eyes checked.  Personal lifestyle choices, including:  Daily care of your teeth and gums.  Regular physical activity.  Eating a healthy diet.  Avoiding tobacco and drug use.  Limiting alcohol use.  Practicing safe sex.  Taking low doses of aspirin every day.  Taking vitamin and mineral supplements as recommended by your health care provider. What happens during an annual well check? The services and screenings done by your health care provider during your annual well check will depend on your age, overall health, lifestyle risk factors, and family history of disease. Counseling  Your health care provider may ask you questions about your:  Alcohol use.  Tobacco use.  Drug use.  Emotional well-being.  Home and relationship well-being.  Sexual activity.  Eating habits.  History of falls.  Memory and ability to understand (cognition).  Work and work Statistician. Screening  You may have the following tests or measurements:  Height, weight, and BMI.  Blood pressure.  Lipid and cholesterol levels. These may be checked every 5 years, or more frequently if you are over 25 years old.  Skin check.  Lung cancer screening. You may have this screening every year starting at age 90 if you have a 30-pack-year history of smoking and currently smoke or have quit within the past 15 years.  Fecal occult blood test (FOBT) of the stool. You may have this test every year starting at age 73.  Flexible sigmoidoscopy or colonoscopy. You may  have a sigmoidoscopy every 5 years or a colonoscopy every 10 years starting at age 61.  Prostate cancer screening.  Recommendations will vary depending on your family history and other risks.  Hepatitis C blood test.  Hepatitis B blood test.  Sexually transmitted disease (STD) testing.  Diabetes screening. This is done by checking your blood sugar (glucose) after you have not eaten for a while (fasting). You may have this done every 1-3 years.  Abdominal aortic aneurysm (AAA) screening. You may need this if you are a current or former smoker.  Osteoporosis. You may be screened starting at age 53 if you are at high risk. Talk with your health care provider about your test results, treatment options, and if necessary, the need for more tests. Vaccines  Your health care provider may recommend certain vaccines, such as:  Influenza vaccine. This is recommended every year.  Tetanus, diphtheria, and acellular pertussis (Tdap, Td) vaccine. You may need a Td booster every 10 years.  Zoster vaccine. You may need this after age 74.  Pneumococcal 13-valent conjugate (PCV13) vaccine. One dose is recommended after age 62.  Pneumococcal polysaccharide (PPSV23) vaccine. One dose is recommended after age 12. Talk to your health care provider about which screenings and vaccines you need and how often you need them. This information is not intended to replace advice given to you by your health care provider. Make sure you discuss any questions you have with your health care provider. Document Released: 07/11/2015 Document Revised: 03/03/2016 Document Reviewed: 04/15/2015 Elsevier Interactive Patient Education  2017 [place] Prevention in the Home Falls can cause injuries. They can happen to people of all ages. There are many things you can do to make your home safe and to help prevent falls. What can I do on the outside of my home?  Regularly fix the edges of walkways and driveways and fix any cracks.  Remove anything that might make you trip as you walk through a door, such as a raised step or  threshold.  Trim any bushes or trees on the path to your home.  Use bright outdoor lighting.  Clear any walking paths of anything that might make someone trip, such as rocks or tools.  Regularly check to see if handrails are loose or broken. Make sure that both sides of any steps have handrails.  Any raised decks and porches should have guardrails on the edges.  Have any leaves, snow, or ice cleared regularly.  Use sand or salt on walking paths during winter.  Clean up any spills in your garage right away. This includes oil or grease spills. What can I do in the bathroom?  Use night lights.  Install grab bars by the toilet and in the tub and shower. Do not use towel bars as grab bars.  Use non-skid mats or decals in the tub or shower.  If you need to sit down in the shower, use a plastic, non-slip stool.  Keep the floor dry. Clean up any water that spills on the floor as soon as it happens.  Remove soap buildup in the tub or shower regularly.  Attach bath mats securely with double-sided non-slip rug tape.  Do not have throw rugs and other things on the floor that can make you trip. What can I do in the bedroom?  Use night lights.  Make sure that you have a light by your bed that is easy to reach.  Do not use any sheets or blankets  that are too big for your bed. They should not hang down onto the floor.  Have a firm chair that has side arms. You can use this for support while you get dressed.  Do not have throw rugs and other things on the floor that can make you trip. What can I do in the kitchen?  Clean up any spills right away.  Avoid walking on wet floors.  Keep items that you use a lot in easy-to-reach places.  If you need to reach something above you, use a strong step stool that has a grab bar.  Keep electrical cords out of the way.  Do not use floor polish or wax that makes floors slippery. If you must use wax, use non-skid floor wax.  Do not have  throw rugs and other things on the floor that can make you trip. What can I do with my stairs?  Do not leave any items on the stairs.  Make sure that there are handrails on both sides of the stairs and use them. Fix handrails that are broken or loose. Make sure that handrails are as long as the stairways.  Check any carpeting to make sure that it is firmly attached to the stairs. Fix any carpet that is loose or worn.  Avoid having throw rugs at the top or bottom of the stairs. If you do have throw rugs, attach them to the floor with carpet tape.  Make sure that you have a light switch at the top of the stairs and the bottom of the stairs. If you do not have them, ask someone to add them for you. What else can I do to help prevent falls?  Wear shoes that:  Do not have high heels.  Have rubber bottoms.  Are comfortable and fit you well.  Are closed at the toe. Do not wear sandals.  If you use a stepladder:  Make sure that it is fully opened. Do not climb a closed stepladder.  Make sure that both sides of the stepladder are locked into place.  Ask someone to hold it for you, if possible.  Clearly mark and make sure that you can see:  Any grab bars or handrails.  First and last steps.  Where the edge of each step is.  Use tools that help you move around (mobility aids) if they are needed. These include:  Canes.  Walkers.  Scooters.  Crutches.  Turn on the lights when you go into a dark area. Replace any light bulbs as soon as they burn out.  Set up your furniture so you have a clear path. Avoid moving your furniture around.  If any of your floors are uneven, fix them.  If there are any pets around you, be aware of where they are.  Review your medicines with your doctor. Some medicines can make you feel dizzy. This can increase your chance of falling. Ask your doctor what other things that you can do to help prevent falls. This information is not intended to  replace advice given to you by your health care provider. Make sure you discuss any questions you have with your health care provider. Document Released: 04/10/2009 Document Revised: 11/20/2015 Document Reviewed: 07/19/2014 Elsevier Interactive Patient Education  2017 [employer].

## 2020-10-06 NOTE — Progress Notes (Signed)
*  Patient is aware of copay*   Chronic Care Management   Note  10/06/2020 Name: [name] [name] MRN: [id number] DOB: [birth year]  [name] [name] is a 75 y.o. year old male who is a primary care patient of [name], MD. I reached out to [name] [name] by phone today in response to a referral sent by Mr. Minette Headland PCP, [name], MD.   Mr. Vandeberg was given information about Chronic Care Management services today including:  1. CCM service includes personalized support from designated clinical staff supervised by his physician, including individualized plan of care and coordination with other care providers 2. 24/7 contact phone numbers for assistance for urgent and routine care needs. 3. Service will only be billed when office clinical staff spend 20 minutes or more in a month to coordinate care. 4. Only one practitioner may furnish and bill the service in a calendar month. 5. The patient may stop CCM services at any time (effective at the end of the month) by phone call to the office staff.   Patient agreed to services and verbal consent obtained.   Follow up plan:   Carley Perdue [employer] Scheduler

## 2020-10-06 NOTE — Progress Notes (Signed)
  Chronic Care Management   Outreach Note  10/06/2020 Name: [name] [name] MRN: [id number] DOB: 05/12/1946  Referred by: [name], MD Reason for referral : No chief complaint on file.   An unsuccessful telephone outreach was attempted today. The patient was referred to the pharmacist for assistance with care management and care coordination.   Follow Up Plan:   Carley Perdue [employer] Scheduler

## 2020-10-06 NOTE — Progress Notes (Signed)
Subjective:   [name] [name] is a 75 y.o. male who presents for Medicare Annual/Subsequent preventive examination.  Review of Systems    No ROS. Medicare Wellness Visit. Additional risk factors are reflected in social history. Cardiac Risk Factors include: family history of premature cardiovascular disease;dyslipidemia;advanced age (>39men, >26 women);hypertension;male gender;obesity (BMI >30kg/m2) Sleep Patterns: Has sleep issues/insomnia, gets up varies times to void. Home Safety/Smoke Alarms: Feels safe in home; uses home alarm. Smoke alarms in place. Living environment: 2-story home; Lives with spouse; yes to needs for DME; good family support system. Seat Belt Safety/Bike Helmet: Wears seat belt.    Objective:    Today's Vitals   10/06/20 0857 10/06/20 0903  BP: 124/80   Pulse: 78   Temp: 98.4 F (36.9 C)   SpO2: 97%   Weight: 274 lb (124.3 kg)   Height: 5\' 10"  (1.778 m)   PainSc: 0-No pain 0-No pain   Body mass index is 39.31 kg/m.  Advanced Directives 10/06/2020 08/29/2020 01/20/2016 09/18/2015 07/29/2015 05/14/2015 05/08/2015  Does Patient Have a Medical Advance Directive? No No No No No No No  Does patient want to make changes to medical advance directive? No - Patient declined - - - - - -  Would patient like information on creating a medical advance directive? - No - Patient declined - No - patient declined information No - patient declined information No - patient declined information -  Pre-existing out of facility DNR order (yellow form or pink MOST form) - - - - - - -    Current Medications (verified) Outpatient Encounter Medications as of 10/06/2020  Medication Sig  . aspirin EC 81 MG tablet Take 1 tablet (81 mg total) by mouth 2 (two) times daily.  . cholecalciferol (VITAMIN D3) 25 MCG (1000 UNIT) tablet Take 2,000 Units by mouth daily.  . DULoxetine (CYMBALTA) 30 MG capsule TAKE 1 CAPSULE BY MOUTH  DAILY  . furosemide (LASIX) 40 MG tablet Take 1 tablet (40 mg  total) by mouth daily.  [name] gabapentin (NEURONTIN) 300 MG capsule TAKE 1 CAPSULE BY MOUTH TWICE PER DAY  . Krill Oil 1000 MG CAPS Take 1 capsule by mouth once.  [name] levothyroxine (SYNTHROID) 150 MCG tablet TAKE 1 TABLET BY MOUTH  DAILY BEFORE BREAKFAST  . losartan-hydrochlorothiazide (HYZAAR) 100-25 MG tablet Take 0.5 tablets by mouth daily.  . metoprolol succinate (TOPROL-XL) 25 MG 24 hr tablet TAKE 1 TABLET BY MOUTH  DAILY  . Multiple Vitamins-Minerals (ICAPS AREDS 2 PO) Take 1 capsule by mouth daily.  [name] omeprazole (PRILOSEC) 40 MG capsule TAKE 1 CAPSULE BY MOUTH  DAILY 30 MINUTES BEFORE  BREAKFAST  . polyethylene glycol (MIRALAX / GLYCOLAX) 17 g packet Take 17 g by mouth daily.  . potassium chloride SA (KLOR-CON) 20 MEQ tablet Take 1 tablet (20 mEq total) by mouth daily.  . simvastatin (ZOCOR) 20 MG tablet TAKE 1 TABLET BY MOUTH  DAILY AT 6 PM.  . UNABLE TO FIND Mega red 1000mg  one per day   No facility-administered encounter medications on file as of 10/06/2020.    Allergies (verified) Penicillins, Codeine, and Sulfonamide derivatives   History: Past Medical History:  Diagnosis Date  . Arthritis   . CAD (coronary artery disease)    cath 3/10: mLAD 30%, pRCA 30%, EF 55-60% Dr. Harrington Challenger cardiologist  most recent  stress stress done ~ 2 years ago with Dr. Harrington Challenger  . Complication of anesthesia    "he usually gets an ileus after back OR"   .  Depression   . Dyspnea    -PFTs compeltely normal 03/20/08 including DLc0  . Environmental allergies    Dust, Smoke  . GERD (gastroesophageal reflux disease)   . HTN (hypertension)   . Hypothyroidism   . Low testosterone   . Observed sleep apnea    can't wear cpap  . Polycythemia   . PONV (postoperative nausea and vomiting)   . Pulmonary nodule 06/13/2012  . Rosacea   . Sleep apnea    Most recent sleep study 2010; records at [facility]   Past Surgical History:  Procedure Laterality Date  . BACK SURGERY  2005; 06/2006; 01/2007; 04/2009  .  CARDIAC CATHETERIZATION  08/2008  . CATARACT EXTRACTION, BILATERAL  2010  . CERVICAL FUSION  04/2002   ?C3-4  . COLONOSCOPY  12/2005   normal screening study.   . ESOPHAGOGASTRODUODENOSCOPY  multiple   last 07/2012 GERD esophagitis, 48 Fr dilation  . ESOPHAGOGASTRODUODENOSCOPY N/A 09/19/2015   Procedure: ESOPHAGOGASTRODUODENOSCOPY (EGD);  Surgeon: [name], MD;  Location: [facility] ENDOSCOPY;  Service: Endoscopy;  Laterality: N/A;  . KNEE ARTHROSCOPY WITH MEDIAL MENISECTOMY Right 05/14/2015   Procedure: RIGHT KNEE ARTHROSCOPY CHONDROPLASTY, PARTIAL  MEDIAL MENISECTOMY;  Surgeon: [name], MD;  Location: [place];  Service: Orthopedics;  Laterality: Right;  . left knee replacement    . left shoudler clean    . LUMBAR DISC SURGERY  06/2006   L3  . MENISCUS REPAIR  08/2008   left  . NECK SURGERY    . POSTERIOR FUSION LUMBAR SPINE  2005; 01/2007; 04/2009   L5; L3-4; L2-3  . PROSTATE SURGERY  2000  . SAVORY DILATION N/A 09/19/2015   Procedure: SAVORY DILATION;  Surgeon: [name], MD;  Location[facility] ENDOSCOPY;  Service: Endoscopy;  Laterality: N/A;  . SHOULDER OPEN ROTATOR CUFF REPAIR Left 1999   left  . TONSILLECTOMY  09/21/11  . TOTAL KNEE ARTHROPLASTY Left 01/2011   left  . TOTAL KNEE ARTHROPLASTY Right 08/08/2015   Procedure: RIGHT TOTAL KNEE ARTHROPLASTY;  Surgeon: [name], MD;  Location: [place];  Service: Orthopedics;  Laterality: Right;  . TRANSURETHRAL RESECTION OF PROSTATE  2006   followed by "surgery to get rid of clots"  . UVULOPALATOPHARYNGOPLASTY, TONSILLECTOMY AND SEPTOPLASTY  09/21/11   Deviated Septum   Family History  Problem Relation Age of Onset  . Breast cancer Mother        dx. mid-70s  . Colon polyps Mother        "60+ colon polyps per yr"  . Diabetes Mother   . Breast cancer Sister 65  . Other Sister        + MUTYH mutation  . Diabetes Sister        borderline  . Heart Problems Sister   . Breast cancer Sister        dx. 44s  .  Heart attack Father   . Alzheimer's disease Father   . Lung cancer Paternal Uncle        x4  . Throat cancer Maternal Uncle        mother's maternal half-brother; dx 32s; +EtOH  . Liver cancer Maternal Uncle        mother's maternal half-brother; either liver cancer or cirrhosis  . Cirrhosis Maternal Uncle        +EtOH  . Stroke Paternal Grandmother   . Heart attack Paternal Grandfather   . Other Daughter        negative genetic testing  in 2017  . Parkinson's disease Other   . Bipolar disorder Other   . Lung cancer Paternal Uncle        (x3)  . Lung cancer Paternal Uncle   . Alzheimer's disease Cousin        paternal 1st cousin, d. 37  . Heart attack Cousin   . Anesthesia problems Neg Hx    Social History   Socioeconomic History  . Marital status: Married    Spouse name: Not on file  . Number of children: 3  . Years of education: Not on file  . Highest education level: Not on file  Occupational History  . Occupation: retired    Fish farm manager: [employer]  Tobacco Use  . Smoking status: Former Smoker    Packs/day: 1.00    Years: 16.00    Pack years: 16.00    Quit date: 07/17/1974    Years since quitting: 46.2  . Smokeless tobacco: Former Network engineer  . Vaping Use: Never used  Substance and Sexual Activity  . Alcohol use: No  . Drug use: No  . Sexual activity: Not on file  Other Topics Concern  . Not on file  Social History Narrative   Area, 3 children.   Retired from the city of [place]   5 caffeinated beverages daily   Social Determinants of Health   Financial Resource Strain: Low Risk   . Difficulty of Paying Living Expenses: Not hard at all  Food Insecurity: No Food Insecurity  . Worried About Charity fundraiser in the Last Year: Never true  . Ran Out of Food in the Last Year: Never true  Transportation Needs: No Transportation Needs  . Lack of Transportation (Medical): No  . Lack of Transportation (Non-Medical): No  Physical Activity:  Inactive  . Days of Exercise per Week: 0 days  . Minutes of Exercise per Session: 0 min  Stress: No Stress Concern Present  . Feeling of Stress : Not at all  Social Connections: Moderately Integrated  . Frequency of Communication with Friends and Family: More than three times a week  . Frequency of Social Gatherings with Friends and Family: More than three times a week  . Attends Religious Services: More than 4 times per year  . Active Member of Clubs or Organizations: Yes  . Attends Archivist Meetings: More than 4 times per year  . Marital Status: Widowed    Tobacco Counseling Counseling given: Not Answered   Clinical Intake:  Pre-visit preparation completed: Yes  Pain : No/denies pain Pain Score: 0-No pain     BMI - recorded: 39.31 Nutritional Status: BMI > 30  Obese Nutritional Risks: None Diabetes: No  How often do you need to have someone help you when you read instructions, pamphlets, or other written materials from your doctor or pharmacy?: 1 - Never What is the last grade level you completed in school?: 11th grade  Diabetic? No  Interpreter Needed?: No  Information entered by :: Lisette Abu, LPN.   Activities of Daily Living In your present state of health, do you have any difficulty performing the following activities: 10/06/2020  Hearing? Y  Vision? N  Difficulty concentrating or making decisions? Y  Walking or climbing stairs? Y  Dressing or bathing? Y  Doing errands, shopping? Y  Preparing Food and eating ? N  Using the Toilet? N  In the past six months, have you accidently leaked urine? Y  Do you have problems  with loss of bowel control? N  Managing your Medications? N  Managing your Finances? N  Housekeeping or managing your Housekeeping? Y  Some recent data might be hidden    Patient Care Team: [name], MD as PCP - General (Internal Medicine) Fay Records, MD as PCP - Cardiology (Cardiology) Wyatt Portela, MD  (Hematology and Oncology) Irine Seal, MD (Urology) Kristeen Miss, MD (Neurosurgery) Marygrace Drought, MD as Consulting Physician (Ophthalmology) Bronson Ing, DPM (Podiatry) Gatha Mayer, MD as Consulting Physician (Gastroenterology)  Indicate any recent Medical Services you may have received from other than [facility] providers in the past year (date may be approximate).     Assessment:   This is a routine wellness examination for [name] [name].  Hearing/Vision screen No exam data present  Dietary issues and exercise activities discussed: Current Exercise Habits: The patient does not participate in regular exercise at present, Exercise limited by: psychological condition(s)  Goals    .  Client understands the importance of follow-up with providers by attending scheduled visits    .  Patient Stated      Wants to be able to do yard work again and to eliminate some of his body pain.    .  Patient Stated (pt-stated)      I just want to feel better.      Depression Screen PHQ 2/9 Scores 10/06/2020 09/22/2020 09/05/2019 09/18/2018  PHQ - 2 Score 2 0 1 1  PHQ- 9 Score - 5 11 3     Fall Risk Fall Risk  10/06/2020 10/03/2019 09/05/2019 03/08/2019 12/28/2018  Falls in the past year? 1 1 - 1 1  Number falls in past yr: 1 1 1 1 1   Injury with Fall? 0 1 1 1  0  Comment - refused ER - - -  Risk for fall due to : Impaired balance/gait History of fall(s);Impaired balance/gait;Orthopedic patient;Impaired mobility Impaired balance/gait;Orthopedic patient Impaired mobility;Impaired balance/gait -  Follow up Falls evaluation completed Falls evaluation completed;Education provided;Falls prevention discussed Falls evaluation completed Education provided;Falls evaluation completed;Falls prevention discussed -    FALL RISK PREVENTION PERTAINING TO THE HOME:  Any stairs in or around the home? Yes  If so, are there any without handrails? No  Home free of loose throw rugs in walkways, pet beds, electrical cords,  etc? Yes  Adequate lighting in your home to reduce risk of falls? Yes   ASSISTIVE DEVICES UTILIZED TO PREVENT FALLS:  Life alert? No  Use of a cane, walker or w/c? Yes  Grab bars in the bathroom? Yes  Shower chair or bench in shower? Yes  Elevated toilet seat or a handicapped toilet? Yes   TIMED UP AND GO:  Was the test performed? No .  Length of time to ambulate 10 feet: 0 sec.   Gait slow and steady with assistive device  Cognitive Function: Normal cognitive status assessed by direct observation by this Nurse Health Advisor. No abnormalities found.       6CIT Screen 10/03/2019  What Year? 0 points  What month? 0 points  What time? 0 points  Count back from 20 0 points  Months in reverse 0 points  Repeat phrase 0 points  Total Score 0    Immunizations Immunization History  Administered Date(s) Administered  . Fluad Quad(high Dose 65+) 03/08/2019  . Influenza Split 03/28/2013  . Influenza, High Dose Seasonal PF 03/28/2018  . Influenza,inj,Quad PF,6+ Mos 02/26/2020  . Influenza-Unspecified 03/29/2011  . PPD Test 08/14/2015  . Pneumococcal  Conjugate-13 07/03/2014  . Pneumococcal Polysaccharide-23 02/19/2013, 03/08/2019  . Tdap 07/15/2008, 09/18/2018  . Zoster Recombinat (Shingrix) 02/23/2018, 05/03/2018    TDAP status: Up to date  Flu Vaccine status: Up to date  Pneumococcal vaccine status: Up to date  Covid-19 vaccine status: Declined, Education has been provided regarding the importance of this vaccine but patient still declined. Advised may receive this vaccine at local pharmacy or Health Dept.or vaccine clinic. Aware to provide a copy of the vaccination record if obtained from local pharmacy or Health Dept. Verbalized acceptance and understanding.  Qualifies for Shingles Vaccine? Yes   Zostavax completed Yes   Shingrix Completed?: Yes  Screening Tests Health Maintenance  Topic Date Due  . COVID-19 Vaccine (1) Never done  . INFLUENZA VACCINE  01/26/2021   . COLONOSCOPY (Pts 45-106yrs Insurance coverage will need to be confirmed)  01/19/2026  . TETANUS/TDAP  09/17/2028  . Hepatitis C Screening  Completed  . PNA vac Low Risk Adult  Completed  . HPV VACCINES  Aged Out    Health Maintenance  Health Maintenance Due  Topic Date Due  . COVID-19 Vaccine (1) Never done    Colorectal cancer screening: Type of screening: Colonoscopy. Completed 7/205/2017. Repeat every 10 years  Lung Cancer Screening: (Low Dose CT Chest recommended if Age 40-80 years, 30 pack-year currently smoking OR have quit w/in 15years.) does qualify.   Lung Cancer Screening Referral: no  Additional Screening:  Hepatitis C Screening: does qualify; Completed yes  Vision Screening: Recommended annual ophthalmology exams for early detection of glaucoma and other disorders of the eye. Is the patient up to date with their annual eye exam?  Yes  Who is the provider or what is the name of the office in which the patient attends annual eye exams? Marygrace Drought, MD. If pt is not established with a provider, would they like to be referred to a provider to establish care? No .   Dental Screening: Recommended annual dental exams for proper oral hygiene  Community Resource Referral / Chronic Care Management: CRR required this visit?  No   CCM required this visit?  No      Plan:     I have personally reviewed and noted the following in the patient's chart:   . Medical and social history . Use of alcohol, tobacco or illicit drugs  . Current medications and supplements . Functional ability and status . Nutritional status . Physical activity . Advanced directives . List of other physicians . Hospitalizations, surgeries, and ER visits in previous 12 months . Vitals . Screenings to include cognitive, depression, and falls . Referrals and appointments  In addition, I have reviewed and discussed with patient certain preventive protocols, quality metrics, and best  practice recommendations. A written personalized care plan for preventive services as well as general preventive health recommendations were provided to patient.     Sheral Flow, LPN   02/19/2352   Nurse Notes:  Medications reviewed with patient; no opioid use noted.

## 2020-10-21 ENCOUNTER — Other Ambulatory Visit: Payer: Self-pay | Admitting: Internal Medicine

## 2020-10-21 DIAGNOSIS — E785 Hyperlipidemia, unspecified: Secondary | ICD-10-CM

## 2020-10-21 DIAGNOSIS — E039 Hypothyroidism, unspecified: Secondary | ICD-10-CM

## 2020-10-21 DIAGNOSIS — I77810 Thoracic aortic ectasia: Secondary | ICD-10-CM

## 2020-10-24 ENCOUNTER — Telehealth: Payer: Self-pay

## 2020-10-24 NOTE — Progress Notes (Signed)
Chronic Care Management Pharmacy Assistant   Name: [name] [name]  MRN: [id number] DOB: [birth year]  Reason for Encounter: Initial Questions Appt: Telephone 10/29/20 @ 2 pm   Recent office visits:  09/22/20 [name] (PCP) - Hypertension, Hypothyroidism & Hyperlipidemia. No med changes. F/u 6 months.  Recent consult visits:  08/28/20 Harrington Challenger (Cadiology) - Hypertension & Leg swelling. Start Furosemide 40 mg & Potassium chloride 20 meq daily.  08/15/20 [facility] (Cadiology) - Hypertension & Leg swelling. Start Losartan-HCTZ 100-25 mg, start furosemide 40 mg & Potassium chloride every other day. F/u labs in 10 days.  08/01/20 [place] (Podiatry) - Pain due to onychomycosis of toenails of both feet Callus. F/u 3 months.  05/12/20 [facility] (Cadiology) - Hypertension & Leg swelling. No med changes. F/u 3 months.  Hospital visits:  None in previous 6 months  Medications: Outpatient Encounter Medications as of 10/24/2020  Medication Sig  . aspirin EC 81 MG tablet Take 1 tablet (81 mg total) by mouth 2 (two) times daily.  . cholecalciferol (VITAMIN D3) 25 MCG (1000 UNIT) tablet Take 2,000 Units by mouth daily.  . DULoxetine (CYMBALTA) 30 MG capsule TAKE 1 CAPSULE BY MOUTH  DAILY  . furosemide (LASIX) 40 MG tablet Take 1 tablet (40 mg total) by mouth daily.  [name] gabapentin (NEURONTIN) 300 MG capsule TAKE 1 CAPSULE BY MOUTH TWICE PER DAY  . Krill Oil 1000 MG CAPS Take 1 capsule by mouth once.  [name] levothyroxine (SYNTHROID) 150 MCG tablet TAKE 1 TABLET BY MOUTH  DAILY BEFORE BREAKFAST  . losartan-hydrochlorothiazide (HYZAAR) 100-25 MG tablet Take 0.5 tablets by mouth daily.  . metoprolol succinate (TOPROL-XL) 25 MG 24 hr tablet TAKE 1 TABLET BY MOUTH  DAILY  . Multiple Vitamins-Minerals (ICAPS AREDS 2 PO) Take 1 capsule by mouth daily.  [name] omeprazole (PRILOSEC) 40 MG capsule TAKE 1 CAPSULE BY MOUTH  DAILY 30 MINUTES BEFORE  BREAKFAST  . polyethylene glycol (MIRALAX / GLYCOLAX) 17 g packet Take 17 g by mouth daily.  .  potassium chloride SA (KLOR-CON) 20 MEQ tablet Take 1 tablet (20 mEq total) by mouth daily.  . simvastatin (ZOCOR) 20 MG tablet TAKE 1 TABLET BY MOUTH  DAILY AT 6 PM.  . UNABLE TO FIND Mega red 1000mg  one per day   No facility-administered encounter medications on file as of 10/24/2020.    Have you seen any other providers since your last visit? Patient wife states he has seen any other providers he does have an appointment coming up on May 31st with Dr. Harrington Challenger.  Any changes in your medications or health?  Patient wife states he recently started furosemide, potassium and change the dosage for his losartan.  Any side effects from any medications?  Patient wife states she thinks the gabapentin makes him him a lot.  Do you have an symptoms or problems not managed by your medications?  Patient wife states no problems at this time.  Any concerns about your health right now?  Patient wife states she worries when he sleeps due to the patient making noises and talking in his sleep.  Has your provider asked that you check blood pressure, blood sugar, or follow special diet at home?  Patient wife states he has a monitor and use to check his BP but no longer checks it or follow any special diets.  Do you get any type of exercise on a regular basis?  Patient wife states he uses a walker and is unable to exercise at this time.  Can you think of a goal you would like to reach for your health?  Patient wife states he would like to eat healthier.  Do you have any problems getting your medications?  Patient states no problems at this time.  Is there anything that you would like to discuss during the appointment?  Patient would like to learn more about [facility].  Please bring medications and supplements to appointment  Star Rating Drugs: Losartan-hydrochlorothiazide-last fill 09/12/20 90D Simvastatin-last fill 08/20/20 [place], RMA Clinical Pharmacists  Assistant [phone]  Time Spent: [phone]

## 2020-10-29 ENCOUNTER — Other Ambulatory Visit: Payer: Self-pay

## 2020-10-29 ENCOUNTER — Ambulatory Visit (INDEPENDENT_AMBULATORY_CARE_PROVIDER_SITE_OTHER): Payer: Medicare Other | Admitting: Pharmacist

## 2020-10-29 DIAGNOSIS — E785 Hyperlipidemia, unspecified: Secondary | ICD-10-CM

## 2020-10-29 DIAGNOSIS — E039 Hypothyroidism, unspecified: Secondary | ICD-10-CM

## 2020-10-29 DIAGNOSIS — I10 Essential (primary) hypertension: Secondary | ICD-10-CM

## 2020-10-29 DIAGNOSIS — I2510 Atherosclerotic heart disease of native coronary artery without angina pectoris: Secondary | ICD-10-CM | POA: Diagnosis not present

## 2020-10-29 DIAGNOSIS — K2210 Ulcer of esophagus without bleeding: Secondary | ICD-10-CM

## 2020-10-29 NOTE — Progress Notes (Signed)
Chronic Care Management Pharmacy Note  10/29/2020 Name:  [name] [name] MRN:  [id number] DOB:  [birth year]  Subjective: [name] [name] is an 75 y.o. year old male who is a primary patient of [name] Lima, MD.  The CCM team was consulted for assistance with disease management and care coordination needs.    Engaged with patient by telephone for initial visit in response to provider referral for pharmacy case management and/or care coordination services.   Consent to Services:  The patient was given the following information about Chronic Care Management services today, agreed to services, and gave verbal consent: 1. CCM service includes personalized support from designated clinical staff supervised by the primary care provider, including individualized plan of care and coordination with other care providers 2. 24/7 contact phone numbers for assistance for urgent and routine care needs. 3. Service will only be billed when office clinical staff spend 20 minutes or more in a month to coordinate care. 4. Only one practitioner may furnish and bill the service in a calendar month. 5.The patient may stop CCM services at any time (effective at the end of the month) by phone call to the office staff. 6. The patient will be responsible for cost sharing (co-pay) of up to 20% of the service fee (after annual deductible is met). Patient agreed to services and consent obtained.  Patient Care Team: [name] Lima, MD as PCP - General (Internal Medicine) Fay Records, MD as PCP - Cardiology (Cardiology) Wyatt Portela, MD (Hematology and Oncology) Irine Seal, MD (Urology) Kristeen Miss, MD (Neurosurgery) Marygrace Drought, MD as Consulting Physician (Ophthalmology) Bronson Ing, DPM (Podiatry) Gatha Mayer, MD as Consulting Physician (Gastroenterology) Charlton Haws, [facility] as Pharmacist (Pharmacist)   Patient lives at home in wife. He uses walker to get around. He spends most of his time  sedentary.  Recent office visits: 09/22/20 [name] [name] (PCP) - Hypertension, Hypothyroidism & Hyperlipidemia. No med changes. F/u 6 months.  Recent consult visits: 08/28/20 [name] [name] (Cadiology) - Hypertension & Leg swelling. Changed Furosemide 40 mg & Potassium chloride 20 meq to daily rather than QOD.  08/2020 PT for poor balance.  08/15/20 [name] [name] (Cadiology) - Hypertension & Leg swelling. BP 148/64. Start Losartan-HCTZ 100-25 mg, start furosemide 40 mg & Potassium chloride every other day (increased from twice a week). F/u labs in 10 days.  08/01/20 [name] [name] (Podiatry) - Pain due to onychomycosis of toenails of both feet Callus. F/u 3 months.  05/12/20 [name] [name] (Cadiology) - Hypertension & Leg swelling. No med changes. F/u 3 months.  Hospital visits: None in previous 6 months  Objective:  Lab Results  Component Value Date   CREATININE 0.78 09/22/2020   BUN 21 09/22/2020   GFR 88.35 09/22/2020   GFRNONAA 87 06/10/2020   GFRAA 100 06/10/2020   NA 136 09/22/2020   K 4.4 09/22/2020   CALCIUM 10.1 09/22/2020   CO2 27 09/22/2020   GLUCOSE 107 (H) 09/22/2020    Lab Results  Component Value Date/Time   HGBA1C 6.3 09/22/2020 11:51 AM   HGBA1C 6.2 (H) 02/26/2020 10:23 AM   GFR 88.35 09/22/2020 11:51 AM   GFR 96.24 09/05/2019 10:51 AM    Last diabetic Eye exam: No results found for: HMDIABEYEEXA  Last diabetic Foot exam: No results found for: HMDIABFOOTEX   Lab Results  Component Value Date   CHOL 116 09/22/2020   HDL 33.50 (L) 09/22/2020   LDLCALC 58 09/22/2020   TRIG 122.0 09/22/2020   CHOLHDL 3  09/22/2020    Hepatic Function Latest Ref Rng & Units 09/05/2019 09/18/2018 01/27/2016  Total Protein 6.0 - 8.3 g/dL 8.1 8.1 7.8  Albumin 3.5 - 5.2 g/dL 4.0 4.0 -  AST 0 - 37 U/L 25 18 -  ALT 0 - 53 U/L 24 17 -  Alk Phosphatase 39 - 117 U/L 74 77 -  Total Bilirubin 0.2 - 1.2 mg/dL 0.7 0.6 -  Bilirubin, Direct 0.0 - 0.3 mg/dL 0.1 - -    Lab Results  Component Value Date/Time   TSH 0.44  09/22/2020 11:51 AM   TSH 0.63 02/26/2020 10:23 AM    CBC Latest Ref Rng & Units 05/12/2020 09/05/2019 09/18/2018  WBC 3.4 - 10.8 x10E3/uL 5.8 5.1 6.7  Hemoglobin 13.0 - 17.7 g/dL 14.7 14.5 14.5  Hematocrit 37.5 - 51.0 % 43.4 44.1 43.3  Platelets 150 - 450 x10E3/uL 211 208.0 332.0    Lab Results  Component Value Date/Time   VD25OH 32.0 01/27/2016 02:40 PM    Clinical ASCVD: Yes  The ASCVD Risk score Mikey Bussing DC Jr., et al., 2013) failed to calculate for the following reasons:   The valid total cholesterol range is 130 to 320 mg/dL    Depression screen [facility] 2/9 10/06/2020 09/22/2020 09/05/2019  Decreased Interest 1 0 1  Down, Depressed, Hopeless 1 0 0  PHQ - 2 Score 2 0 1  Altered sleeping - 2 3  Tired, decreased energy - 2 2  Change in appetite - 1 2  Feeling bad or failure about yourself  - 0 0  Trouble concentrating - 0 3  Moving slowly or fidgety/restless - 0 0  Suicidal thoughts - 0 0  PHQ-9 Score - 5 11  Difficult doing work/chores - Somewhat difficult Very difficult     Social History   Tobacco Use  Smoking Status Former Smoker  . Packs/day: 1.00  . Years: 16.00  . Pack years: 16.00  . Quit date: 07/17/1974  . Years since quitting: 46.3  Smokeless Tobacco Former User   BP Readings from Last 3 Encounters:  10/06/20 124/80  09/23/20 124/68  08/29/20 122/70   Pulse Readings from Last 3 Encounters:  10/06/20 78  09/23/20 65  08/28/20 66   Wt Readings from Last 3 Encounters:  10/06/20 274 lb (124.3 kg)  09/23/20 277 lb (125.6 kg)  08/28/20 278 lb 12.8 oz (126.5 kg)   BMI Readings from Last 3 Encounters:  10/06/20 39.31 kg/m  09/23/20 39.75 kg/m  08/28/20 40.00 kg/m    Assessment/Interventions: Review of patient past medical history, allergies, medications, health status, including review of consultants reports, laboratory and other test data, was performed as part of comprehensive evaluation and provision of chronic care management services.   SDOH:   (Social Determinants of Health) assessments and interventions performed: Yes  SDOH Screenings   Alcohol Screen: Low Risk   . Last Alcohol Screening Score (AUDIT): 0  Depression (PHQ2-9): Low Risk   . PHQ-2 Score: 2  Financial Resource Strain: Low Risk   . Difficulty of Paying Living Expenses: Not hard at all  Food Insecurity: No Food Insecurity  . Worried About Charity fundraiser in the Last Year: Never true  . Ran Out of Food in the Last Year: Never true  Housing: Low Risk   . Last Housing Risk Score: 0  Physical Activity: Inactive  . Days of Exercise per Week: 0 days  . Minutes of Exercise per Session: 0 min  Social Connections: Moderately Integrated  .  Frequency of Communication with Friends and Family: More than three times a week  . Frequency of Social Gatherings with Friends and Family: More than three times a week  . Attends Religious Services: More than 4 times per year  . Active Member of Clubs or Organizations: Yes  . Attends Archivist Meetings: More than 4 times per year  . Marital Status: Widowed  Stress: No Stress Concern Present  . Feeling of Stress : Not at all  Tobacco Use: Medium Risk  . Smoking Tobacco Use: Former Smoker  . Smokeless Tobacco Use: Former Soil scientist Needs: No Transportation Needs  . Lack of Transportation (Medical): No  . Lack of Transportation (Non-Medical): No    CCM Care Plan  Allergies  Allergen Reactions  . Penicillins Hives and Other (See Comments)    "I break out in welts" Has patient had a PCN reaction causing immediate rash, facial/tongue/throat swelling, SOB or lightheadedness with hypotension: No Has patient had a PCN reaction causing severe rash involving mucus membranes or skin necrosis: No Has patient had a PCN reaction that required hospitalization No Has patient had a PCN reaction occurring within the last 10 years: No If all of the above answers are "NO", then may proceed with Cephalosporin use.  .  Codeine Nausea And Vomiting  . Sulfonamide Derivatives Nausea And Vomiting    Medications Reviewed Today    Reviewed by Charlton Haws, [facility] (Pharmacist) on 10/29/20 at 1511  Med List Status: <None>  Medication Order Taking? Sig Documenting Provider Last Dose Status Informant  aspirin EC 81 MG tablet [id number] Yes Take 1 tablet (81 mg total) by mouth 2 (two) times daily. Thurnell Lose, MD Taking Active   cholecalciferol (VITAMIN D3) 25 MCG (1000 UNIT) tablet [id number] Yes Take 2,000 Units by mouth daily. [provider] Taking Active   DULoxetine (CYMBALTA) 30 MG capsule [id number] Yes TAKE 1 CAPSULE BY MOUTH  DAILY [name] Lima, MD Taking Active   furosemide (LASIX) 40 MG tablet [id number] Yes Take 1 tablet (40 mg total) by mouth daily. Fay Records, MD Taking Active   gabapentin (NEURONTIN) 300 MG capsule [id number] Yes Take 300 mg by mouth at bedtime. [provider] Taking Active   levothyroxine (SYNTHROID) 150 MCG tablet [id number] Yes TAKE 1 TABLET BY MOUTH  DAILY BEFORE BREAKFAST [name] Lima, MD Taking Active   losartan-hydrochlorothiazide [facility]) 100-25 MG tablet [id number] Yes Take 0.5 tablets by mouth daily. Fay Records, MD Taking Active   metoprolol succinate (TOPROL-XL) 25 MG 24 hr tablet [id number] Yes TAKE 1 TABLET BY MOUTH  DAILY Fay Records, MD Taking Active   Multiple Vitamins-Minerals (ICAPS AREDS 2 PO) [id number] Yes Take 1 capsule by mouth daily. [provider] Taking Active   omeprazole (PRILOSEC) 40 MG capsule [id number] Yes TAKE 1 CAPSULE BY MOUTH  DAILY 30 MINUTES BEFORE  BREAKFAST [name] Lima, MD Taking Active   polyethylene glycol (MIRALAX / GLYCOLAX) 17 g packet [id number] Yes Take 17 g by mouth daily. [provider] Taking Active   potassium chloride SA (KLOR-CON) 20 MEQ tablet [id number] Yes Take 1 tablet (20 mEq total) by mouth daily. Fay Records, MD Taking Active   simvastatin (ZOCOR) 20 MG tablet [id number] Yes  TAKE 1 TABLET BY MOUTH  DAILY AT 6 PM. [name] Lima, MD Taking Active   UNABLE TO FIND [id number] Yes Mega red 1052m one per day [provider] Taking Active  Patient Active Problem List   Diagnosis Date Noted  . Atherosclerosis of aorta ([place]) 09/22/2020  . 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 Onychomycosis of toenail 09/05/2019  . Ataxia involving legs 09/05/2019  . Benign prostatic hyperplasia without lower urinary tract symptoms 09/18/2018  . Prediabetes 09/18/2018  . Claudication of both lower extremities ([place]) 09/18/2018  . Hyperlipidemia LDL goal <70 09/18/2018  . Pure hyperglyceridemia 09/18/2018  . Routine general medical examination at a health care facility 09/18/2018  . GERD (gastroesophageal reflux disease)   . CAD (coronary artery disease)   . OSA (obstructive sleep apnea) 09/18/2015  . Mild concentric left ventricular hypertrophy (LVH) 09/18/2015  . 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[place]) 09/18/2015  . Hypothyroidism 09/18/2015  . Chronic idiopathic constipation 08/18/2015  . Primary localized osteoarthritis of right knee 08/08/2015  . Pulmonary nodule 06/13/2012  . CAD, NATIVE VESSEL 06/18/2009  . Essential hypertension 07/14/2007    Immunization History  Administered Date(s) Administered  . Fluad Quad(high Dose 65+) 03/08/2019  . Influenza Split 03/28/2013  . Influenza, High Dose Seasonal PF 03/28/2018  . Influenza,inj,Quad PF,6+ Mos 02/26/2020  . Influenza-Unspecified 03/29/2011  . PPD Test 08/14/2015  . Pneumococcal Conjugate-13 07/03/2014  . Pneumococcal Polysaccharide-23 02/19/2013, 03/08/2019  . Tdap 07/15/2008, 09/18/2018  . Zoster Recombinat (Shingrix) 02/23/2018, 05/03/2018    Conditions to be addressed/monitored:  Hypertension, Hyperlipidemia, Coronary Artery Disease, GERD, Hypothyroidism and Osteoarthritis  Care Plan : CCM Pharmacy Care Plan  Updates made by Charlton Haws, [place] since 10/29/2020 12:00 AM    Problem:  Hypertension, Hyperlipidemia, Coronary Artery Disease, GERD, Hypothyroidism and Osteoarthritis   Priority: High    Long-Range Goal: Disease management   Start Date: 10/29/2020  Expected End Date: 10/29/2021  This Visit's Progress: On track  Priority: High  Note:   Current Barriers:  . Unable to independently monitor therapeutic efficacy  Pharmacist Clinical Goal(s):  [name] Patient will achieve adherence to monitoring guidelines and medication adherence to achieve therapeutic efficacy through collaboration with PharmD and provider.   Interventions: . 1:1 collaboration with [name] Lima, MD regarding development and update of comprehensive plan of care as evidenced by provider attestation and co-signature . Inter-disciplinary care team collaboration (see longitudinal plan of care) . Comprehensive medication review performed; medication list updated in electronic medical record  Hypertension (BP goal <130/80) -Controlled - BP at home is at goal, pt denies side effects; he does report edema/swelling is not under control; he does not follow a low salt diet -Current treatment: . Metoprolol succinate 25 mg daily . Losartan-HCTZ 100-25 - 1/2 tab daily . Furosemide 40 mg daily -Medications previously tried:   -Current home readings: 120s/60s -Denies hypotensive/hypertensive symptoms -Educated on BP goals and benefits of medications for prevention of heart attack, stroke and kidney damage; Daily salt intake goal < 2300 mg; -Counseled to monitor BP at home daily, document, and provide log at future appointments -Recommended to continue current medication and reduce salt  Hyperlipidemia / CAD (LDL goal < 70) -Controlled - LDL is at goal; pt denies side effects -hx cath 3/10 mLAD 30%, pRCA 30%, EF 55-60%; aortic atherosclerosis on CT -Current treatment: . Simvastatin 20 mg daily . Aspirin 81 mg BID . Mega Red 1000 mg -Educated on Cholesterol goals;  Benefits of statin for ASCVD risk  reduction;  -Counseled on benefits of aspirin for established CAD -Recommended to continue current medication  Hypothyroidism (Goal: maintain TSH in goal range) -Controlled - TSH is at goal; pt takes levothyroxine with other meds in AM -  Current treatment  . Levothyroxine 150 mcg daily  -Recommended to continue current medication  GERD (Goal: manage symptoms) -Controlled - pt reports before he started PPI he had severe issues with GERD and vomiting; he has had esophageal dilation and symptoms are stable now -Current treatment  . Omeprazole 40 mg daily -Patient is satisfied with current regimen and denies issues -Recommended to continue current medication  Depression (Goal: manage symptoms) -Controlled - pt reports mood is improved since taking duloxetine; he has history of frequent crying episodes but these have largely subsided -Current treatment: . Duloxetine 30 mg daily -Medications previously tried/failed: n/a -PHQ9: 2 (09/2020) -GAD7: not on file -Connected with PCP for mental health support -Educated on Benefits of medication for symptom control -Recommended to continue current medication  Pain (Goal: manage symptoms) -Controlled - pt endorses excess daytime sleepiness; he recently reduced gabapentin to once a day at bedtime to see if this helps -osteoarthritis -Current treatment  . Gabapentin 300 mg BID - taking 1 daily d/t sleepiness . Duloxetine 30 mg daily HS -Counseled on benefits of reducing gabapentin - encouraged pt to continue trial of once daily gabapentin -Counseled on benefits of duloxetine or pain -Recommended to continue current medication  Health Maintenance -Vaccine gaps: covid - pt reports their daughter will "disown them" if they get the covid vaccine; discussed risks and benefits of vaccine -Current therapy:  [name] Miralax PRN . Vitamin D 2000 IU daily . Icaps Areds 2 . Klor-Con 20 mEq daily -Patient is satisfied with current therapy and denies  issues -Recommended covid vaccine  Patient Goals/Self-Care Activities . Patient will:  - take medications as prescribed focus on medication adherence by pill box check blood pressure daily, document, and provide at future appointments engage in dietary modifications by reducing salt in diet  Follow Up Plan: Telephone follow up appointment with care management team member scheduled for: 6 months      Medication Assistance: None required.  Patient affirms current coverage meets needs.  Patient's preferred pharmacy is:  [id number] [place], [place] [address]Phone: [phone]: [phone] [address] [address] [place]Phone: [phone]: [phone] Uses pill box? Yes Pt endorses 100% compliance  We discussed: Current pharmacy is preferred with insurance plan and patient is satisfied with pharmacy services Patient decided to: Continue current medication management strategy  Care Plan and Follow Up Patient Decision:  Patient agrees to Care Plan and Follow-up.  Plan: Telephone follow up appointment with care management team member scheduled for:  6 months  LCharlene Brooke PharmD, [place] CPP Clinical Pharmacist [facility]emanPrimary [facility] at [phone]

## 2020-10-29 NOTE — Patient Instructions (Addendum)
Visit Information  Phone number for Pharmacist: [phone]  Thank you for meeting with me to discuss your medications! I look forward to working with you to achieve your health care goals. Below is a summary of what we talked about during the visit:  Goals Addressed            This Visit's Progress   . Manage My Medicine       Timeframe:  Long-Range Goal Priority:  Medium Start Date:     10/29/20                        Expected End Date: 10/29/21                      Follow Up Date 05/27/21   - call for medicine refill 2 or 3 days before it runs out - call if I am sick and can't take my medicine - keep a list of all the medicines I take; vitamins and herbals too - use a pillbox to sort medicine  -Reduce salt in diet (<2000 mg ideally)   Why is this important?   . These steps will help you keep on track with your medicines.   Notes:        [name] [name] was given information about Chronic Care Management services today including:  1. CCM service includes personalized support from designated clinical staff supervised by his physician, including individualized plan of care and coordination with other care providers 2. 24/7 contact phone numbers for assistance for urgent and routine care needs. 3. Standard insurance, coinsurance, copays and deductibles apply for chronic care management only during months in which we provide at least 20 minutes of these services. Most insurances cover these services at 100%, however patients may be responsible for any copay, coinsurance and/or deductible if applicable. This service may help you avoid the need for more expensive face-to-face services. 4. Only one practitioner may furnish and bill the service in a calendar month. 5. The patient may stop CCM services at any time (effective at the end of the month) by phone call to the office staff.  Patient agreed to services and verbal consent obtained.   The patient verbalized understanding of  instructions, educational materials, and care plan provided today and declined offer to receive copy of patient instructions, educational materials, and care plan.  Telephone follow up appointment with pharmacy team member scheduled for: 6 months  [name], PharmD, BCACP, CPP Clinical Pharmacist [facility] [phone]  Low-Sodium Eating Plan Sodium, which is an element that makes up salt, helps you maintain a healthy balance of fluids in your body. Too much sodium can increase your blood pressure and cause fluid and waste to be held in your body. Your health care provider or dietitian may recommend following this plan if you have high blood pressure (hypertension), kidney disease, liver disease, or heart failure. Eating less sodium can help lower your blood pressure, reduce swelling, and protect your heart, liver, and kidneys. What are tips for following this plan? Reading food labels  The Nutrition Facts label lists the amount of sodium in one serving of the food. If you eat more than one serving, you must multiply the listed amount of sodium by the number of servings.  Choose foods with less than 140 mg of sodium per serving.  Avoid foods with 300 mg of sodium or more per serving. Shopping  Look for lower-sodium  products, often labeled as "low-sodium" or "no salt added."  Always check the sodium content, even if foods are labeled as "unsalted" or "no salt added."  Buy fresh foods. ? Avoid canned foods and pre-made or frozen meals. ? Avoid canned, cured, or processed meats.  Buy breads that have less than 80 mg of sodium per slice.   Cooking  Eat more home-cooked food and less restaurant, buffet, and fast food.  Avoid adding salt when cooking. Use salt-free seasonings or herbs instead of table salt or sea salt. Check with your health care provider or pharmacist before using salt substitutes.  Cook with plant-based oils, such as canola,  sunflower, or olive oil.   Meal planning  When eating at a restaurant, ask that your food be prepared with less salt or no salt, if possible. Avoid dishes labeled as brined, pickled, cured, smoked, or made with soy sauce, miso, or teriyaki sauce.  Avoid foods that contain MSG (monosodium glutamate). MSG is sometimes added to [place] food, bouillon, and some canned foods.  Make meals that can be grilled, baked, poached, roasted, or steamed. These are generally made with less sodium. General information Most people on this plan should limit their sodium intake to 1,500-2,000 mg (milligrams) of sodium each day. What foods should I eat? Fruits Fresh, frozen, or canned fruit. Fruit juice. Vegetables Fresh or frozen vegetables. "No salt added" canned vegetables. "No salt added" tomato sauce and paste. Low-sodium or reduced-sodium tomato and vegetable juice. Grains Low-sodium cereals, including oats, puffed wheat and rice, and shredded wheat. Low-sodium crackers. Unsalted rice. Unsalted pasta. Low-sodium bread. Whole-grain breads and whole-grain pasta. Meats and other proteins Fresh or frozen (no salt added) meat, poultry, seafood, and fish. Low-sodium canned tuna and salmon. Unsalted nuts. Dried peas, beans, and lentils without added salt. Unsalted canned beans. Eggs. Unsalted nut butters. Dairy Milk. Soy milk. Cheese that is naturally low in sodium, such as ricotta cheese, fresh mozzarella, or Swiss cheese. Low-sodium or reduced-sodium cheese. Cream cheese. Yogurt. Seasonings and condiments Fresh and dried herbs and spices. Salt-free seasonings. Low-sodium mustard and ketchup. Sodium-free salad dressing. Sodium-free light mayonnaise. Fresh or refrigerated horseradish. Lemon juice. Vinegar. Other foods Homemade, reduced-sodium, or low-sodium soups. Unsalted popcorn and pretzels. Low-salt or salt-free chips. The items listed above may not be a complete list of foods and beverages you can eat.  Contact a dietitian for more information. What foods should I avoid? Vegetables Sauerkraut, pickled vegetables, and relishes. Olives. [place] fries. Onion rings. Regular canned vegetables (not low-sodium or reduced-sodium). Regular canned tomato sauce and paste (not low-sodium or reduced-sodium). Regular tomato and vegetable juice (not low-sodium or reduced-sodium). Frozen vegetables in sauces. Grains Instant hot cereals. Bread stuffing, pancake, and biscuit mixes. Croutons. Seasoned rice or pasta mixes. Noodle soup cups. Boxed or frozen macaroni and cheese. Regular salted crackers. Self-rising flour. Meats and other proteins Meat or fish that is salted, canned, smoked, spiced, or pickled. Precooked or cured meat, such as sausages or meat loaves. Berniece Salines. Ham. Pepperoni. Hot dogs. Corned beef. Chipped beef. Salt pork. Jerky. Pickled herring. Anchovies and sardines. Regular canned tuna. Salted nuts. Dairy Processed cheese and cheese spreads. Hard cheeses. Cheese curds. Blue cheese. Feta cheese. String cheese. Regular cottage cheese. Buttermilk. Canned milk. Fats and oils Salted butter. Regular margarine. Ghee. Bacon fat. Seasonings and condiments Onion salt, garlic salt, seasoned salt, table salt, and sea salt. Canned and packaged gravies. Worcestershire sauce. Tartar sauce. Barbecue sauce. Teriyaki sauce. Soy sauce, including reduced-sodium. Steak sauce. Fish sauce. [employer]  sauce. Cocktail sauce. Horseradish that you find on the shelf. Regular ketchup and mustard. Meat flavorings and tenderizers. Bouillon cubes. Hot sauce. Pre-made or packaged marinades. Pre-made or packaged taco seasonings. Relishes. Regular salad dressings. Salsa. Other foods Salted popcorn and pretzels. Corn chips and puffs. Potato and tortilla chips. Canned or dried soups. Pizza. Frozen entrees and pot pies. The items listed above may not be a complete list of foods and beverages you should avoid. Contact a dietitian for more  information. Summary  Eating less sodium can help lower your blood pressure, reduce swelling, and protect your heart, liver, and kidneys.  Most people on this plan should limit their sodium intake to 1,500-2,000 mg (milligrams) of sodium each day.  Canned, boxed, and frozen foods are high in sodium. Restaurant foods, fast foods, and pizza are also very high in sodium. You also get sodium by adding salt to food.  Try to cook at home, eat more fresh fruits and vegetables, and eat less fast food and canned, processed, or prepared foods. This information is not intended to replace advice given to you by your health care provider. Make sure you discuss any questions you have with your health care provider. Document Revised: 07/20/2019 Document Reviewed: 05/16/2019 Elsevier Patient Education  2021 [employer].

## 2020-11-24 NOTE — Progress Notes (Signed)
Cardiology Office Note:    Date:  11/25/2020   ID:  [name] [name], DOB 1946/03/02, MRN [id number]  PCP:  [name], MD   [facility] Providers Cardiologist:  [name], MD      Referring MD: [name], MD   Chief Complaint:  Follow-up (Leg edema, CAD, HTN)    Patient Profile:    [name] [name] is a 75 y.o. male with:   Coronary artery disease (non-obstructive)  Hypertension   Hyperlipidemia   OSA  Thoracic aortic aneurysm   Hx of multiple back and neck surgeries   Hypothyroidism   GERD  Venous insufficiency   Prior CV studies: Echocardiogram 09/10/20 EF 65-70, No RWMA, mild LVH, normal RVSF, mild dilation of aortic root (42 mm), dilated ascending aorta (41 mm)   CARDIAC TELEMETRY MONITORING-INTERPRETATION ONLY 12/27/2018 Narrative Sinus rhythm I have reviewed strips   There does not appear to be a significant arrhythmias (computer with missed calls x 1) Episode of syncope did not correlate with an arrhythmia or signif slowing of heart rate  Chest CTA 08/06/5186 Stable uncomplicated mild fusiform aneurysmal dilatation of the ascending thoracic aorta measuring 41 mm in diameter, unchanged compared to the 2015 examination.  Echo 06/16/2018 Mod focal basal septal hypertrophy, EF 55-60, no RWMA, Gr 1 DD, +SAM, no LVOT obstruction, trivial MR, normal RSVF, trivial TR  AAA [name] 10/24/16 Normal caliber abd aorta  Cardiac Catheterization 08/26/2008 LM normal  LAD mid 30 RCA prox 30 EF 55-60   History of Present Illness: [name] [name] was last seen by Dr. Harrington Challenger in 3/22.  His furosemide has been adjusted for leg edema.  A f/u echocardiogram showed normal EF.  He returns for f/u.  He is here alone.  He ambulates with a walker.  he wonders if gabapentin could have caused his edema.  He was placed on this by his neurosurgeon (Dr. Ellene Route).  Edema is listed as a side effect.  He feels his edema improved when he stopped this.  He has lost ~10 lbs. He has not  had chest pain, significant shortness of breath, syncope, orthopnea.          Past Medical History:  Diagnosis Date  . Arthritis   . CAD (coronary artery disease)    cath 3/10: mLAD 30%, pRCA 30%, EF 55-60% Dr. Harrington Challenger cardiologist  most recent  stress stress done ~ 2 years ago with Dr. Harrington Challenger  . Complication of anesthesia    "he usually gets an ileus after back OR"   . Depression   . Dyspnea    -PFTs compeltely normal 03/20/08 including DLc0  . Environmental allergies    Dust, Smoke  . GERD (gastroesophageal reflux disease)   . HTN (hypertension)   . Hypothyroidism   . Low testosterone   . Observed sleep apnea    can't wear cpap  . Polycythemia   . PONV (postoperative nausea and vomiting)   . Pulmonary nodule 06/13/2012  . Rosacea   . Sleep apnea    Most recent sleep study 2010; records at [facility] office    Current Medications: Current Meds  Medication Sig  . aspirin EC 81 MG tablet Take 1 tablet (81 mg total) by mouth 2 (two) times daily.  . cholecalciferol (VITAMIN D3) 25 MCG (1000 UNIT) tablet Take 2,000 Units by mouth daily.  . DULoxetine (CYMBALTA) 30 MG capsule TAKE 1 CAPSULE BY MOUTH  DAILY  . furosemide (LASIX) 40 MG tablet Take 1 tablet (40 mg  total) by mouth daily.  [name] gabapentin (NEURONTIN) 300 MG capsule Take 300 mg by mouth at bedtime.  [name] levothyroxine (SYNTHROID) 150 MCG tablet TAKE 1 TABLET BY MOUTH  DAILY BEFORE BREAKFAST  . losartan-hydrochlorothiazide (HYZAAR) 100-25 MG tablet Take 0.5 tablets by mouth daily.  . metoprolol succinate (TOPROL-XL) 25 MG 24 hr tablet TAKE 1 TABLET BY MOUTH  DAILY  . Multiple Vitamins-Minerals (ICAPS AREDS 2 PO) Take 1 capsule by mouth daily.  [name] omeprazole (PRILOSEC) 40 MG capsule TAKE 1 CAPSULE BY MOUTH  DAILY 30 MINUTES BEFORE  BREAKFAST  . polyethylene glycol (MIRALAX / GLYCOLAX) 17 g packet Take 17 g by mouth daily.  . potassium chloride SA (KLOR-CON) 20 MEQ tablet Take 1 tablet (20 mEq total) by mouth daily.  . simvastatin  (ZOCOR) 20 MG tablet TAKE 1 TABLET BY MOUTH  DAILY AT 6 PM.  . UNABLE TO FIND Take 1 capsule by mouth daily. Mega red 1000mg  one per day     Allergies:   Penicillins, Codeine, and Sulfonamide derivatives   Social History   Tobacco Use  . Smoking status: Former Smoker    Packs/day: 1.00    Years: 16.00    Pack years: 16.00    Quit date: 07/17/1974    Years since quitting: 46.3  . Smokeless tobacco: Former Network engineer  . Vaping Use: Never used  Substance Use Topics  . Alcohol use: No  . Drug use: No     Family Hx: The patient's family history includes Alzheimer's disease in his cousin and father; Bipolar disorder in an other family member; Breast cancer in his mother and sister; Breast cancer (age of onset: 45) in his sister; Cirrhosis in his maternal uncle; Colon polyps in his mother; Diabetes in his mother and sister; Heart Problems in his sister; Heart attack in his cousin, father, and paternal grandfather; Liver cancer in his maternal uncle; Lung cancer in his paternal uncle, paternal uncle, and paternal uncle; Other in his daughter and sister; Parkinson's disease in an other family member; Stroke in his paternal grandmother; Throat cancer in his maternal uncle. There is no history of Anesthesia problems.  ROS   EKGs/Labs/Other Test Reviewed:    EKG:  EKG is not ordered today.  The ekg ordered today demonstrates n/a  Recent Labs: 05/12/2020: Hemoglobin 14.7; NT-Pro BNP 41; Platelets 211 09/22/2020: BUN 21; Creatinine, Ser 0.78; Potassium 4.4; Sodium 136; TSH 0.44   Recent Lipid Panel Lab Results  Component Value Date/Time   CHOL 116 09/22/2020 11:51 AM   TRIG 122.0 09/22/2020 11:51 AM   HDL 33.50 (L) 09/22/2020 11:51 AM   LDLCALC 58 09/22/2020 11:51 AM      Risk Assessment/Calculations:      Physical Exam:    VS:  BP 104/64   Pulse 86   Ht 5\' 10"  (1.778 m)   Wt 272 lb 6.4 oz (123.6 kg)   SpO2 97%   BMI 39.09 kg/m     Wt Readings from Last 3 Encounters:   11/25/20 272 lb 6.4 oz (123.6 kg)  10/06/20 274 lb (124.3 kg)  09/23/20 277 lb (125.6 kg)     Constitutional:      Appearance: Healthy appearance. Not in distress.  Pulmonary:     Effort: Pulmonary effort is normal.     Breath sounds: No wheezing. No rales.  Cardiovascular:     Normal rate. Regular rhythm. Normal S1. Normal S2.     Murmurs: There is no murmur.  Edema:  Pretibial: bilateral trace edema of the pretibial area.    Ankle: trace edema of the left ankle and 1+ edema of the right ankle. Abdominal:     Palpations: Abdomen is soft.  Musculoskeletal:     Cervical back: Neck supple. Skin:    General: Skin is warm and dry.  Neurological:     General: No focal deficit present.     Mental Status: Alert and oriented to person, place and time.     Cranial Nerves: Cranial nerves are intact.          ASSESSMENT & PLAN:    1. Swelling of both lower extremities He seems to have venous insufficiency.  His edema worsens throughout the day and improves with elevation.  The gabapentin may have made this somewhat worse.  It has improved since stopping gabapentin as well as since starting on furosemide.  I suspect his immobility makes his edema worse.  I have recommended he get compression stockings.  I given him prescription for knee-high compression stockings today.  Continue current dose of furosemide.  Continue with elevation.  2. Essential hypertension The patient's blood pressure is controlled on his current regimen.  Continue current therapy.   3. Coronary artery disease involving native coronary artery of native heart without angina pectoris Mild nonobstructive disease by cardiac catheterization in 2010.  He is not having anginal symptoms.  Continue aspirin, simvastatin.  4. Mixed hyperlipidemia LDL optimal on most recent lab work.  Continue current Rx.    5. 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HCC) 41 mm by echocardiogram in 3/22.  This is stable when compared to CT in 2020.   He will need a follow-up echocardiogram versus chest CTA in 1 year.      Dispo:  Return in about 6 months (around 05/27/2021) for Routine follow in 6 months with  Dr.Ross..   Medication Adjustments/Labs and Tests Ordered: Current medicines are reviewed at length with the patient today.  Concerns regarding medicines are outlined above.  Tests Ordered: No orders of the defined types were placed in this encounter.  Medication Changes: No orders of the defined types were placed in this encounter.   Signed, Richardson Dopp, PA-C  11/25/2020 10:54 AM    [facility] [place], [place], [place]  [address] Phone: [phone]; Fax: [phone]

## 2020-11-25 ENCOUNTER — Encounter: Payer: Self-pay | Admitting: Physician Assistant

## 2020-11-25 ENCOUNTER — Other Ambulatory Visit: Payer: Self-pay

## 2020-11-25 ENCOUNTER — Ambulatory Visit: Payer: Medicare Other | Admitting: Physician Assistant

## 2020-11-25 VITALS — BP 104/64 | HR 86 | Ht 70.0 in | Wt 272.4 lb

## 2020-11-25 DIAGNOSIS — I10 Essential (primary) hypertension: Secondary | ICD-10-CM

## 2020-11-25 DIAGNOSIS — I2510 Atherosclerotic heart disease of native coronary artery without angina pectoris: Secondary | ICD-10-CM

## 2020-11-25 DIAGNOSIS — M7989 Other specified soft tissue disorders: Secondary | ICD-10-CM | POA: Diagnosis not present

## 2020-11-25 DIAGNOSIS — I77810 Thoracic aortic ectasia: Secondary | ICD-10-CM

## 2020-11-25 DIAGNOSIS — E782 Mixed hyperlipidemia: Secondary | ICD-10-CM | POA: Diagnosis not present

## 2020-11-25 NOTE — Patient Instructions (Signed)
Medication Instructions:  Your physician recommends that you continue on your current medications as directed. Please refer to the Current Medication list given to you today.  *If you need a refill on your cardiac medications before your next appointment, please call your pharmacy*   Lab Work: -NONE   If you have labs (blood work) drawn today and your tests are completely normal, you will receive your results only by: [name] MyChart Message (if you have MyChart) OR . A paper copy in the mail If you have any lab test that is abnormal or we need to change your treatment, we will call you to review the results.   Testing/Procedures: -NONE   Follow-Up: At [facility], you and your health needs are our priority.  As part of our continuing mission to provide you with exceptional heart care, we have created designated Provider Care Teams.  These Care Teams include your primary Cardiologist (physician) and Advanced Practice Providers (APPs -  Physician Assistants and Nurse Practitioners) who all work together to provide you with the care you need, when you need it.  We recommend signing up for the patient portal called "MyChart".  Sign up information is provided on this After Visit Summary.  MyChart is used to connect with patients for Virtual Visits (Telemedicine).  Patients are able to view lab/test results, encounter notes, upcoming appointments, etc.  Non-urgent messages can be sent to your provider as well.   To learn more about what you can do with MyChart, go to [email or web address].    Your next appointment:   6 month(s)  The format for your next appointment:   In Person  Provider:   [name], MD   Other Instructions You will need Bilateral Knee High Compression Stockings.  You can check a medical supply store, [facility], [facility], [facility] and [facility].

## 2020-12-28 ENCOUNTER — Other Ambulatory Visit: Payer: Self-pay | Admitting: Internal Medicine

## 2020-12-28 DIAGNOSIS — E039 Hypothyroidism, unspecified: Secondary | ICD-10-CM

## 2021-03-19 ENCOUNTER — Ambulatory Visit: Payer: Medicare Other | Admitting: Internal Medicine

## 2021-03-19 ENCOUNTER — Encounter: Payer: Self-pay | Admitting: Internal Medicine

## 2021-03-19 ENCOUNTER — Other Ambulatory Visit: Payer: Self-pay

## 2021-03-19 VITALS — BP 126/82 | HR 65 | Temp 97.9°F | Ht 70.0 in | Wt 266.0 lb

## 2021-03-19 DIAGNOSIS — E039 Hypothyroidism, unspecified: Secondary | ICD-10-CM | POA: Diagnosis not present

## 2021-03-19 DIAGNOSIS — Z23 Encounter for immunization: Secondary | ICD-10-CM

## 2021-03-19 DIAGNOSIS — R7303 Prediabetes: Secondary | ICD-10-CM

## 2021-03-19 DIAGNOSIS — I10 Essential (primary) hypertension: Secondary | ICD-10-CM | POA: Diagnosis not present

## 2021-03-19 DIAGNOSIS — M67449 Ganglion, unspecified hand: Secondary | ICD-10-CM | POA: Insufficient documentation

## 2021-03-19 LAB — CBC WITH DIFFERENTIAL/PLATELET
Basophils Absolute: 0 10*3/uL (ref 0.0–0.1)
Basophils Relative: 0.7 % (ref 0.0–3.0)
Eosinophils Absolute: 0.2 10*3/uL (ref 0.0–0.7)
Eosinophils Relative: 2.7 % (ref 0.0–5.0)
HCT: 44.3 % (ref 39.0–52.0)
Hemoglobin: 14.5 g/dL (ref 13.0–17.0)
Lymphocytes Relative: 26 % (ref 12.0–46.0)
Lymphs Abs: 1.7 10*3/uL (ref 0.7–4.0)
MCHC: 32.7 g/dL (ref 30.0–36.0)
MCV: 94.4 fl (ref 78.0–100.0)
Monocytes Absolute: 0.6 10*3/uL (ref 0.1–1.0)
Monocytes Relative: 9.9 % (ref 3.0–12.0)
Neutro Abs: 3.9 10*3/uL (ref 1.4–7.7)
Neutrophils Relative %: 60.7 % (ref 43.0–77.0)
Platelets: 226 10*3/uL (ref 150.0–400.0)
RBC: 4.69 Mil/uL (ref 4.22–5.81)
RDW: 13.8 % (ref 11.5–15.5)
WBC: 6.3 10*3/uL (ref 4.0–10.5)

## 2021-03-19 LAB — BASIC METABOLIC PANEL
BUN: 16 mg/dL (ref 6–23)
CO2: 33 mEq/L — ABNORMAL HIGH (ref 19–32)
Calcium: 9.8 mg/dL (ref 8.4–10.5)
Chloride: 101 mEq/L (ref 96–112)
Creatinine, Ser: 0.83 mg/dL (ref 0.40–1.50)
GFR: 86.41 mL/min (ref >60.00)
Glucose, Bld: 111 mg/dL — ABNORMAL HIGH (ref 70–99)
Potassium: 4.1 mEq/L (ref 3.5–5.1)
Sodium: 139 mEq/L (ref 135–145)

## 2021-03-19 LAB — TSH: TSH: 0.37 u[IU]/mL (ref 0.35–5.50)

## 2021-03-19 LAB — HEMOGLOBIN A1C: Hgb A1c MFr Bld: 6.3 % (ref 4.6–6.5)

## 2021-03-19 NOTE — Progress Notes (Signed)
Subjective:  Patient ID: [name] [name], male    DOB: [birth year]  Age: 75 y.o. MRN: [id number]  CC: No chief complaint on file.  This visit occurred during the SARS-CoV-2 public health emergency.  Safety protocols were in place, including screening questions prior to the visit, additional usage of staff PPE, and extensive cleaning of exam room while observing appropriate contact time as indicated for disinfecting solutions.    HPI [name] [name] presents for f/up -  He developed a painless cyst on his LRF about 3 months ago.  He complains of chronic, unchanged nonproductive cough and GERD with intermittent hiccups.  He denies odynophagia, dysphagia, chest pain, hemoptysis, abdominal pain, or diaphoresis.  Outpatient Medications Prior to Visit  Medication Sig Dispense Refill   aspirin EC 81 MG tablet Take 1 tablet (81 mg total) by mouth 2 (two) times daily.     cholecalciferol (VITAMIN D3) 25 MCG (1000 UNIT) tablet Take 2,000 Units by mouth daily.     DULoxetine (CYMBALTA) 30 MG capsule TAKE 1 CAPSULE BY MOUTH  DAILY 90 capsule 1   furosemide (LASIX) 40 MG tablet Take 1 tablet (40 mg total) by mouth daily. 90 tablet 3   gabapentin (NEURONTIN) 300 MG capsule Take 300 mg by mouth at bedtime.     levothyroxine (SYNTHROID) 150 MCG tablet TAKE 1 TABLET BY MOUTH  DAILY BEFORE BREAKFAST 90 tablet 0   losartan-hydrochlorothiazide (HYZAAR) 100-25 MG tablet Take 0.5 tablets by mouth daily. 45 tablet 3   metoprolol succinate (TOPROL-XL) 25 MG 24 hr tablet TAKE 1 TABLET BY MOUTH  DAILY 90 tablet 3   Multiple Vitamins-Minerals (ICAPS AREDS 2 PO) Take 1 capsule by mouth daily.     omeprazole (PRILOSEC) 40 MG capsule TAKE 1 CAPSULE BY MOUTH  DAILY 30 MINUTES BEFORE  BREAKFAST 90 capsule 1   polyethylene glycol (MIRALAX / GLYCOLAX) 17 g packet Take 17 g by mouth daily.     potassium chloride SA (KLOR-CON) 20 MEQ tablet Take 1 tablet (20 mEq total) by mouth daily. 90 tablet 3   simvastatin (ZOCOR) 20 MG  tablet TAKE 1 TABLET BY MOUTH  DAILY AT 6 PM. 90 tablet 1   UNABLE TO FIND Take 1 capsule by mouth daily. Mega red 1000mg  one per day     No facility-administered medications prior to visit.    ROS Review of Systems  Constitutional:  Negative for diaphoresis and fatigue.  HENT: Negative.    Eyes: Negative.   Respiratory:  Positive for cough. Negative for chest tightness, shortness of breath and wheezing.   Cardiovascular:  Negative for chest pain, palpitations and leg swelling.  Gastrointestinal:  Negative for abdominal pain, constipation, diarrhea and vomiting.  Endocrine: Negative.  Negative for cold intolerance and heat intolerance.  Genitourinary: Negative.  Negative for difficulty urinating.  Musculoskeletal: Negative.   Skin: Negative.   Neurological: Negative.  Negative for dizziness and weakness.  Hematological:  Negative for adenopathy. Does not bruise/bleed easily.  Psychiatric/Behavioral: Negative.     Objective:  BP 126/82 (BP Location: Left Arm, Patient Position: Sitting, Cuff Size: Large)   Pulse 65   Temp 97.9 F (36.6 C) (Oral)   Ht 5\' 10"  (1.778 m)   Wt 266 lb (120.7 kg)   SpO2 95%   BMI 38.17 kg/m   BP Readings from Last 3 Encounters:  03/20/21 140/81  03/19/21 126/82  11/25/20 104/64    Wt Readings from Last 3 Encounters:  03/20/21 266 lb (120.7 kg)  03/19/21  266 lb (120.7 kg)  11/25/20 272 lb 6.4 oz (123.6 kg)    Physical Exam Vitals reviewed.  Constitutional:      Appearance: He is obese. He is not ill-appearing.  HENT:     Nose: Nose normal.     Mouth/Throat:     Mouth: Mucous membranes are moist.  Eyes:     General: No scleral icterus.    Conjunctiva/sclera: Conjunctivae normal.  Cardiovascular:     Rate and Rhythm: Normal rate.     Heart sounds: No murmur heard. Pulmonary:     Effort: Pulmonary effort is normal.     Breath sounds: No stridor. No wheezing, rhonchi or rales.  Abdominal:     General: Abdomen is protuberant. Bowel  sounds are normal. There is no distension.     Palpations: Abdomen is soft. There is no hepatomegaly, splenomegaly or mass.  Musculoskeletal:        General: Normal range of motion.     Cervical back: Neck supple.     Right lower leg: No edema.     Left lower leg: No edema.     Comments: LRF -  Cystic lesion over the middle phalanx/ulnar side just distal to the PIP joint. The cyst measures 2-3 cm and the prox aspect is hand and distal aspect is soft/squishy.  Lymphadenopathy:     Cervical: No cervical adenopathy.  Skin:    General: Skin is warm and dry.  Neurological:     General: No focal deficit present.    Lab Results  Component Value Date   WBC 6.3 03/19/2021   HGB 14.5 03/19/2021   HCT 44.3 03/19/2021   PLT 226.0 03/19/2021   GLUCOSE 111 (H) 03/19/2021   CHOL 116 09/22/2020   TRIG 122.0 09/22/2020   HDL 33.50 (L) 09/22/2020   LDLCALC 58 09/22/2020   ALT 24 09/05/2019   AST 25 09/05/2019   NA 139 03/19/2021   K 4.1 03/19/2021   CL 101 03/19/2021   CREATININE 0.83 03/19/2021   BUN 16 03/19/2021   CO2 33 (H) 03/19/2021   TSH 0.37 03/19/2021   PSA 0.35 09/22/2020   INR 1.03 07/29/2015   HGBA1C 6.3 03/19/2021    MR Cervical Spine Wo Contrast  Result Date: 02/04/2019  [facility] [address] [place], [place] [address] [phone] NEUROIMAGING REPORT STUDY DATE: 02/02/2019 PATIENT NAME: [name] [name] DOB: Feb 22, 1946 MRN: [id number] EXAM: MRI of the cervical spine ORDERING CLINICIAN: Antony Contras, MD CLINICAL HISTORY: 75 year old man with cervical radiculopathy COMPARISON FILMS: MRI 02/22/2018 TECHNIQUE: MRI of the cervical spine was obtained utilizing 3 mm sagittal slices from the posterior fossa down to the T3-4 level with T1, T2 and inversion recovery views. In addition 4 mm axial slices from U6-3 down to T1-2 level were included with T2 and gradient echo views. CONTRAST: None IMAGING SITE: [facility], [place], [place], [place]  FINDINGS: :  On sagittal images, the spine is imaged from above the cervicomedullary junction to T2.   The spinal cord is of normal caliber.  There is T2 hyperintense signal within the cord adjacent to C3-C4 consistent with chronic mild low malacia, stable compared to the previous MRI.[name]  There is a solid fusion at C3-C4 associated with ACDF hardware.  Reduced disc height is noted at C4-C5, C6-C7 and C7-T1.  The vertebral bodies are normally aligned.   The vertebral bodies have normal signal.  The discs and interspaces were further evaluated on axial views from C2  to T1 as follows: C2-C3: There is a small right disc osteophyte complex causing mild right foraminal narrowing but no nerve root compression or spinal stenosis. C3-C4: This level is fused.  There is a right paramedian disc osteophyte complex and right facet hypertrophy causing moderate right foraminal narrowing.  There is also mild left foraminal narrowing.  No nerve root compression or spinal stenosis. C4-C5: There are disc osteophyte complexes and facet hypertrophy.  This causes mild spinal stenosis and moderate bilateral foraminal narrowing. C5-C6: There are disc osteophyte complexes, right greater than left causing mild spinal stenosis, severe right foraminal narrowing and moderate left foraminal narrowing. C6-C7: There is disc protrusion and uncovertebral spurring and ligamenta flava hypertrophy causing mild spinal stenosis and moderate bilateral foraminal narrowing. C7-T1: There is a right disc osteophyte complex, ligamenta flava hypertrophy and left greater than right facet hypertrophy.  There is no spinal stenosis but there is progressed moderate right and stable mild to moderate left foraminal narrowing. T1-T2: There is disc protrusion and ligamenta flava hypertrophy causing borderline spinal stenosis and mild to moderate foraminal narrowing. When compared to the MRI dated 02/22/2018, changes at C7-T1 have progressed with more foraminal narrowing to  the right on the current study.   This MRI of the cervical spine without contrast shows the following: 1.   At C3-C4, there is stable ACDF.  There is moderate right foraminal narrowing but no definite nerve root compression or spinal stenosis. 2.   At C4-C5, there are stable degenerative changes causing stable mild spinal stenosis and moderate foraminal narrowing but no definite nerve root compression.. 3.   At C5-C6, there are stable degenerative changes causing mild spinal stenosis and severe right foraminal narrowing with potential for right C6 nerve root compression 4.   At C6-C7, there are stable degenerative changes causing mild spinal stenosis and moderate foraminal narrowing but no definite nerve root compression. 5.   At C7-T1, there are progressive degenerative changes causing moderate right foraminal narrowing and moderate left foraminal narrowing.  There is no definite nerve root compression though there is more encroachment upon the right C8 nerve root compression than was present on the previous MRI. 6.   At T1-T2, there are degenerative changes causing borderline spinal stenosis but no nerve root compression. 7.   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 Felecia Shelling, MD, PhD, FAAN Certified in  Neuroimaging by [employer]    Assessment & Plan:   Diagnoses and all orders for this visit:  Essential hypertension- His blood pressure is adequately well controlled. -     CBC with Differential/Platelet; Future -     CBC with Differential/Platelet  Acquired hypothyroidism- His TSH is in the normal range.  He will stay on the current dose of T4. -     TSH; Future -     TSH  Prediabetes- His A1c is at 6.3%.  Medical therapy is not yet indicated. -     Basic metabolic panel; Future -     Hemoglobin A1c; Future -     Hemoglobin A1c -     Basic metabolic panel  Flu vaccine need -     Flu Vaccine  QUAD High Dose(Fluad)  Digital mucous cyst of finger -     Ambulatory referral to Orthopedic Surgery  I am having Jawaun D. Gaede maintain his aspirin EC, gabapentin, polyethylene glycol, UNABLE TO FIND, cholecalciferol, metoprolol succinate, losartan-hydrochlorothiazide, Multiple Vitamins-Minerals (ICAPS  AREDS 2 PO), furosemide, potassium chloride SA, omeprazole, simvastatin, DULoxetine, and levothyroxine.  No orders of the defined types were placed in this encounter.    Follow-up: Return in about 6 months (around 09/16/2021).  Scarlette Calico, MD

## 2021-03-19 NOTE — Patient Instructions (Signed)

## 2021-03-20 ENCOUNTER — Ambulatory Visit: Payer: Self-pay

## 2021-03-20 ENCOUNTER — Encounter: Payer: Self-pay | Admitting: Orthopedic Surgery

## 2021-03-20 ENCOUNTER — Ambulatory Visit (INDEPENDENT_AMBULATORY_CARE_PROVIDER_SITE_OTHER): Payer: Medicare Other | Admitting: Orthopedic Surgery

## 2021-03-20 VITALS — BP 140/81 | HR 71 | Ht 70.0 in | Wt 266.0 lb

## 2021-03-20 DIAGNOSIS — M79645 Pain in left finger(s): Secondary | ICD-10-CM

## 2021-03-20 DIAGNOSIS — R2232 Localized swelling, mass and lump, left upper limb: Secondary | ICD-10-CM

## 2021-03-20 NOTE — Progress Notes (Signed)
Office Visit Note   Patient: [name] [name]           Date of Birth: 1945/07/02           MRN: [id number] Visit Date: 03/20/2021              Requested by: [name], MD [address] [place],  [place] [address] PCP: [name], MD   Assessment & Plan: Visit Diagnoses:  1. Pain in left finger(s)   2. Mass of finger of left hand     Plan: Discussed with patient that I think we should get an MRI to further evaluate this mass.  It feels both firm and cystic and is rather large at 2.5-3cm in length.  I will see him back in the office after the MRI is completed.   Follow-Up Instructions: No follow-ups on file.   Orders:  Orders Placed This Encounter  Procedures   XR Finger Ring Left   MR FINGERS LEFT W WO CONTRAST   No orders of the defined types were placed in this encounter.     Procedures: No procedures performed   Clinical Data: No additional findings.   Subjective: Chief Complaint  Patient presents with   Left Hand - New Patient (Initial Visit)    This is a 75 yo RHD M who presents with a mass at the ulnar aspect of his left ring finger.  He noticed the mass incidentally 3-4 weeks ago.  It is not painful but is bothersome to him.  He still has near full ROM of this finger.  He has mucous cysts involving both hands including the index, middle, and small fingers of his left hand.  He has some baseline numbness and paresthesias in BUE and BLE secondary to reported neck issues.    Review of Systems  Constitutional: Negative.   Respiratory: Negative.    Cardiovascular: Negative.   Musculoskeletal: Negative.   Neurological: Negative.     Objective: Vital Signs: BP 140/81 (BP Location: Left Arm, Patient Position: Sitting, Cuff Size: Normal)   Pulse 71   Ht 5\' 10"  (1.778 m)   Wt 266 lb (120.7 kg)   SpO2 95%   BMI 38.17 kg/m   Physical Exam Cardiovascular:     Rate and Rhythm: Normal rate.  Pulmonary:     Effort: Pulmonary effort is normal.   Skin:    General: Skin is warm and dry.     Capillary Refill: Capillary refill takes less than 2 seconds.  Neurological:     General: No focal deficit present.     Mental Status: He is alert.    Left Hand Exam   Range of Motion  The patient has normal left wrist ROM.  Other  Erythema: absent Sensation: normal Pulse: present  Comments:  Approx 2.5-3cm length mass at ulnar aspect of ring finger around PIPJ.  Distal aspect of mass feels soft and cystic but proximal half of mass feels firm and fibrous.  Near full ROM at MP, PIP, DIP joints of this finger.      Specialty Comments:  No specialty comments available.  Imaging: Multiple views of the left ring finger reviewed by me.  They demonstrate osteoarthritic changes with joint space narrowing and osteophytes.    PMFS History: Patient Active Problem List   Diagnosis Date Noted   Flu vaccine need 03/19/2021   Digital mucous cyst of finger 03/19/2021   Atherosclerosis of aorta (HCC) 09/22/2020   Tinnitus aurium, bilateral  02/26/2020   Onychomycosis of toenail 09/05/2019   Ataxia involving legs 09/05/2019   Benign prostatic hyperplasia without lower urinary tract symptoms 09/18/2018   Prediabetes 09/18/2018   Claudication of both lower extremities ([place]) 09/18/2018   Hyperlipidemia LDL goal <70 09/18/2018   Pure hyperglyceridemia 09/18/2018   Routine general medical examination at a health care facility 09/18/2018   GERD (gastroesophageal reflux disease)    CAD (coronary artery disease)    OSA (obstructive sleep apnea) 09/18/2015   Mild concentric left ventricular hypertrophy (LVH) 09/18/2015   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 Harrington Challenger cardiologist  most recent  stress stress done ~ 2 years ago with Dr. Harrington Challenger   Complication of anesthesia    "he usually gets an ileus after back OR"    Depression    Dyspnea    -PFTs compeltely normal 03/20/08 including DLc0   Environmental allergies    Dust, Smoke   GERD (gastroesophageal reflux disease)    HTN (hypertension)    Hypothyroidism    Low testosterone    Observed sleep apnea    can't wear cpap   Polycythemia    PONV (postoperative nausea and vomiting)    Pulmonary nodule 06/13/2012   Rosacea    Sleep apnea    Most recent sleep study 2010; records at [facility]    Family History  Problem Relation Age of Onset   Breast cancer Mother        dx. mid-70s   Colon polyps Mother        "5+ colon polyps per yr"   Diabetes Mother    Breast cancer Sister 62   Other Sister        + MUTYH mutation   Diabetes Sister        borderline   Heart Problems Sister    Breast cancer Sister        dx. 53s   Heart attack Father    Alzheimer's disease Father    Lung cancer Paternal Uncle        x4   Throat cancer Maternal Uncle        mother's maternal half-brother; dx 80s; +EtOH   Liver cancer Maternal Uncle        mother's maternal half-brother; either liver cancer or cirrhosis   Cirrhosis Maternal Uncle        +EtOH   Stroke Paternal Grandmother    Heart attack Paternal Grandfather    Other Daughter        negative genetic testing in 2017   Parkinson's disease Other    Bipolar disorder Other    Lung cancer Paternal Uncle        (x3)   Lung cancer Paternal Uncle    Alzheimer's disease Cousin        paternal 1st cousin, d. 86   Heart attack Cousin    Anesthesia problems Neg Hx     Past Surgical History:  Procedure Laterality Date   BACK SURGERY  2005; 06/2006; 01/2007; 04/2009   CARDIAC CATHETERIZATION  08/2008   CATARACT EXTRACTION, BILATERAL  2010   CERVICAL  FUSION  04/2002   ?C3-4   COLONOSCOPY  12/2005   normal screening study.    ESOPHAGOGASTRODUODENOSCOPY  multiple   last 07/2012 GERD esophagitis, 48 Fr dilation   ESOPHAGOGASTRODUODENOSCOPY N/A 09/19/2015   Procedure: ESOPHAGOGASTRODUODENOSCOPY (EGD);  Surgeon: [name], MD;  [facility] ENDOSCOPY;  Service: Endoscopy;  Laterality: N/A;   KNEE ARTHROSCOPY WITH MEDIAL MENISECTOMY Right 05/14/2015   Procedure: RIGHT KNEE ARTHROSCOPY CHONDROPLASTY, PARTIAL  MEDIAL MENISECTOMY;  Surgeon: [name], MD;  Location: [facility];  Service: Orthopedics;  Laterality: Right;   left knee replacement     left shoudler clean     LUMBAR DISC SURGERY  06/2006   L3   MENISCUS REPAIR  08/2008   left   NECK SURGERY     POSTERIOR FUSION LUMBAR SPINE  2005; 01/2007; 04/2009   L5; L3-4; L2-3   PROSTATE SURGERY  2000   SAVORY DILATION N/A 09/19/2015   Procedure: SAVORY DILATION;  Surgeon: [name], MD;  Location[facility] ENDOSCOPY;  Service: Endoscopy;  Laterality: N/A;   SHOULDER OPEN ROTATOR CUFF REPAIR Left 1999   left   TONSILLECTOMY  09/21/11   TOTAL KNEE ARTHROPLASTY Left 01/2011   left   TOTAL KNEE ARTHROPLASTY Right 08/08/2015   Procedure: RIGHT TOTAL KNEE ARTHROPLASTY;  Surgeon: [name], MD;  Location: [facility];  Service: Orthopedics;  Laterality: Right;   TRANSURETHRAL RESECTION OF PROSTATE  2006   followed by "surgery to get rid of clots"   UVULOPALATOPHARYNGOPLASTY, TONSILLECTOMY AND SEPTOPLASTY  09/21/11   Deviated Septum   Social History   Occupational History   Occupation: retired    Fish farm manager: [employer]  Tobacco Use   Smoking status: Former    Packs/day: 1.00    Years: 16.00    Pack years: 16.00    Types: Cigarettes    Quit date: 07/17/1974    Years since quitting: 46.7   Smokeless tobacco: Former  Scientific laboratory technician Use: Never used  Substance and Sexual Activity   Alcohol use: No   Drug use: No   Sexual activity: Not on file

## 2021-03-29 ENCOUNTER — Other Ambulatory Visit: Payer: Self-pay | Admitting: Internal Medicine

## 2021-03-29 DIAGNOSIS — E785 Hyperlipidemia, unspecified: Secondary | ICD-10-CM

## 2021-03-29 DIAGNOSIS — I77810 Thoracic aortic ectasia: Secondary | ICD-10-CM

## 2021-03-29 DIAGNOSIS — E039 Hypothyroidism, unspecified: Secondary | ICD-10-CM

## 2021-03-30 ENCOUNTER — Other Ambulatory Visit: Payer: Self-pay

## 2021-03-30 MED ORDER — LOSARTAN POTASSIUM-HCTZ 100-25 MG PO TABS: 0.5000 | ORAL_TABLET | Freq: Every day | ORAL | 2 refills | Status: DC

## 2021-04-04 ENCOUNTER — Other Ambulatory Visit: Payer: Self-pay

## 2021-04-04 ENCOUNTER — Ambulatory Visit
Admission: RE | Admit: 2021-04-04 | Discharge: 2021-04-04 | Disposition: A | Discharge status: Home / Self Care | Payer: Medicare Other | Source: Ambulatory Visit | Attending: Orthopedic Surgery | Admitting: Orthopedic Surgery

## 2021-04-04 DIAGNOSIS — R2232 Localized swelling, mass and lump, left upper limb: Secondary | ICD-10-CM

## 2021-04-04 MED ORDER — GADOBENATE DIMEGLUMINE 529 MG/ML IV SOLN
20.0000 mL | Freq: Once | INTRAVENOUS | Status: AC | PRN
  Administered 2021-04-04: 20 mL via INTRAVENOUS

## 2021-04-07 ENCOUNTER — Other Ambulatory Visit: Payer: Self-pay

## 2021-04-07 ENCOUNTER — Encounter: Payer: Self-pay | Admitting: Orthopedic Surgery

## 2021-04-07 ENCOUNTER — Ambulatory Visit: Payer: Medicare Other | Admitting: Orthopedic Surgery

## 2021-04-07 DIAGNOSIS — M67442 Ganglion, left hand: Secondary | ICD-10-CM | POA: Diagnosis not present

## 2021-04-07 DIAGNOSIS — M79642 Pain in left hand: Secondary | ICD-10-CM

## 2021-04-07 NOTE — Progress Notes (Signed)
Office Visit Note   Patient: [name] [name]           Date of Birth: October 09, 1945           MRN: [id number] Visit Date: 04/07/2021              Requested by: [name], MD [address] [place],  [place] [address] PCP: [name], MD   Assessment & Plan: Visit Diagnoses:  1. Ganglion cyst of finger of left hand     Plan: Discussed with patient that the MRI and his exam are most consistent with a ganglion cyst.  We discussed treatment options for ganglion cysts including continued observation, aspiration, and cyst excision.  He opted for cyst aspiration.  We discussed that the cyst may return after aspiration.  If it returns he wants to proceed with surgical excision.  Risks of aspiration discussed.  We will see him back in the office if the cyst returns.   Follow-Up Instructions: No follow-ups on file.   Orders:  No orders of the defined types were placed in this encounter.  No orders of the defined types were placed in this encounter.     Procedures: Hand/UE Inj for (Left ring finger ganglion cyst at ulnar aspect of PIP joint. ) on 04/07/2021 11:28 AM Indications: therapeutic Details: 18 G needle, ulnar approach Aspirate: 2 mL clear (Clear viscous fluid consistent with ganglion cyst) Outcome: tolerated well, no immediate complications Procedure, treatment alternatives, risks and benefits explained, specific risks discussed. Consent was given by the patient. Patient was prepped and draped in the usual sterile fashion.      Clinical Data: No additional findings.   Subjective: Chief Complaint  Patient presents with   Left Ring Finger - Follow-up    MRI Review    This is a 75 yo RHD M who presents for follow up of a left ring finger mass.  He recently had an MRI of his left ring finger.  He still has no symptoms related to the mass but he does not like it's appearance.  He denies numbness or paresthesias in this finger.  He has several DIPJ cysts but these  are also not bothersome.    Review of Systems   Objective: Vital Signs: There were no vitals taken for this visit.  Physical Exam Constitutional:      Appearance: Normal appearance.  Cardiovascular:     Rate and Rhythm: Normal rate.     Pulses: Normal pulses.  Pulmonary:     Effort: Pulmonary effort is normal.  Skin:    General: Skin is warm and dry.     Capillary Refill: Capillary refill takes less than 2 seconds.  Neurological:     Mental Status: He is alert.    Left Hand Exam   Tenderness  The patient is experiencing no tenderness.   Range of Motion  The patient has normal left wrist ROM.  Muscle Strength  The patient has normal left wrist strength.  Other  Erythema: absent Sensation: normal Pulse: present  Comments:  Approx 1 x 2 cm mass at ulnar border of ring finger at the PIPJ.  Mass feels cystic.  Full ROM at PIPJ.      Specialty Comments:  No specialty comments available.  Imaging: No results found.   PMFS History: Patient Active Problem List   Diagnosis Date Noted   Ganglion cyst of finger of left hand 04/07/2021   Flu vaccine need 03/19/2021   Digital  mucous cyst of finger 03/19/2021   Atherosclerosis of aorta ([place]) 09/22/2020   Tinnitus aurium, bilateral 02/26/2020   Onychomycosis of toenail 09/05/2019   Ataxia involving legs 09/05/2019   Benign prostatic hyperplasia without lower urinary tract symptoms 09/18/2018   Prediabetes 09/18/2018   Claudication of both lower extremities ([place]) 09/18/2018   Hyperlipidemia LDL goal <70 09/18/2018   Pure hyperglyceridemia 09/18/2018   Routine general medical examination at a health care facility 09/18/2018   GERD (gastroesophageal reflux disease)    CAD (coronary artery disease)    OSA (obstructive sleep apnea) 09/18/2015   Mild concentric left ventricular hypertrophy (LVH) 09/18/2015   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 Harrington Challenger cardiologist  most recent  stress stress done ~ 2 years ago with Dr. Harrington Challenger   Complication of anesthesia    "he usually gets an ileus after back OR"    Depression    Dyspnea    -PFTs compeltely normal 03/20/08 including DLc0   Environmental allergies    Dust, Smoke   GERD (gastroesophageal reflux disease)    HTN (hypertension)    Hypothyroidism    Low testosterone    Observed sleep apnea    can't wear cpap   Polycythemia    PONV (postoperative nausea and vomiting)    Pulmonary nodule 06/13/2012   Rosacea    Sleep apnea    Most recent sleep study 2010; records at [facility]    Family History  Problem Relation Age of Onset   Breast cancer Mother        dx. mid-70s   Colon polyps Mother        "90+ colon polyps per yr"   Diabetes Mother    Breast cancer Sister 8   Other Sister        + MUTYH mutation   Diabetes Sister        borderline   Heart Problems Sister    Breast cancer Sister        dx. 3s   Heart attack Father    Alzheimer's disease Father    Lung cancer Paternal Uncle        x4   Throat cancer Maternal Uncle        mother's maternal half-brother; dx 3s; +EtOH   Liver cancer Maternal Uncle        mother's maternal half-brother; either liver cancer or cirrhosis   Cirrhosis Maternal Uncle        +EtOH   Stroke Paternal Grandmother    Heart attack Paternal Grandfather    Other Daughter        negative genetic testing in 2017   Parkinson's disease Other    Bipolar disorder Other    Lung cancer Paternal Uncle        (x3)   Lung cancer Paternal Uncle    Alzheimer's disease Cousin        paternal 1st cousin, d. 81   Heart attack Cousin     Anesthesia problems Neg Hx     Past Surgical History:  Procedure Laterality Date   BACK SURGERY  2005; 06/2006; 01/2007; 04/2009  CARDIAC CATHETERIZATION  08/2008   CATARACT EXTRACTION, BILATERAL  2010   CERVICAL FUSION  04/2002   ?C3-4   COLONOSCOPY  12/2005   normal screening study.    ESOPHAGOGASTRODUODENOSCOPY  multiple   last 07/2012 GERD esophagitis, 48 Fr dilation   ESOPHAGOGASTRODUODENOSCOPY N/A 09/19/2015   Procedure: ESOPHAGOGASTRODUODENOSCOPY (EGD);  Surgeon: [name], MD;  Location[facility] ENDOSCOPY;  Service: Endoscopy;  Laterality: N/A;   KNEE ARTHROSCOPY WITH MEDIAL MENISECTOMY Right 05/14/2015   Procedure: RIGHT KNEE ARTHROSCOPY CHONDROPLASTY, PARTIAL  MEDIAL MENISECTOMY;  Surgeon: [name], MD;  Location: [facility];  Service: Orthopedics;  Laterality: Right;   left knee replacement     left shoudler clean     LUMBAR DISC SURGERY  06/2006   L3   MENISCUS REPAIR  08/2008   left   NECK SURGERY     POSTERIOR FUSION LUMBAR SPINE  2005; 01/2007; 04/2009   L5; L3-4; L2-3   PROSTATE SURGERY  2000   SAVORY DILATION N/A 09/19/2015   Procedure: SAVORY DILATION;  Surgeon: [name], MD;  Location: [facility] ENDOSCOPY;  Service: Endoscopy;  Laterality: N/A;   SHOULDER OPEN ROTATOR CUFF REPAIR Left 1999   left   TONSILLECTOMY  09/21/11   TOTAL KNEE ARTHROPLASTY Left 01/2011   left   TOTAL KNEE ARTHROPLASTY Right 08/08/2015   Procedure: RIGHT TOTAL KNEE ARTHROPLASTY;  Surgeon: [name], MD;  Location: [facility];  Service: Orthopedics;  Laterality: Right;   TRANSURETHRAL RESECTION OF PROSTATE  2006   followed by "surgery to get rid of clots"   UVULOPALATOPHARYNGOPLASTY, TONSILLECTOMY AND SEPTOPLASTY  09/21/11   Deviated Septum   Social History   Occupational History   Occupation: retired    Fish farm manager: [employer]  Tobacco Use   Smoking status: Former    Packs/day: 1.00    Years: 16.00    Pack years: 16.00    Types: Cigarettes    Quit date:  07/17/1974    Years since quitting: 46.7   Smokeless tobacco: Former  Scientific laboratory technician Use: Never used  Substance and Sexual Activity   Alcohol use: No   Drug use: No   Sexual activity: Not on file

## 2021-04-28 ENCOUNTER — Ambulatory Visit (INDEPENDENT_AMBULATORY_CARE_PROVIDER_SITE_OTHER): Payer: Medicare Other | Admitting: Pharmacist

## 2021-04-28 ENCOUNTER — Other Ambulatory Visit: Payer: Self-pay

## 2021-04-28 DIAGNOSIS — E785 Hyperlipidemia, unspecified: Secondary | ICD-10-CM

## 2021-04-28 DIAGNOSIS — K2210 Ulcer of esophagus without bleeding: Secondary | ICD-10-CM

## 2021-04-28 DIAGNOSIS — I10 Essential (primary) hypertension: Secondary | ICD-10-CM

## 2021-04-28 DIAGNOSIS — I2510 Atherosclerotic heart disease of native coronary artery without angina pectoris: Secondary | ICD-10-CM

## 2021-04-28 MED ORDER — OMEPRAZOLE 40 MG PO CPDR: 40.0000 mg | DELAYED_RELEASE_CAPSULE | Freq: Two times a day (BID) | ORAL | 1 refills | Status: DC

## 2021-04-28 NOTE — Patient Instructions (Signed)
Visit Information  Phone number for Pharmacist: [phone]   Goals Addressed             This Visit's Progress    Manage My Medicine       Timeframe:  Long-Range Goal Priority:  Medium Start Date:     10/29/20                        Expected End Date: 10/29/21                      Follow Up Date May 2023   - call for medicine refill 2 or 3 days before it runs out - call if I am sick and can't take my medicine - keep a list of all the medicines I take; vitamins and herbals too - use a pillbox to sort medicine  -Reduce salt in diet (<2000 mg ideally)   Why is this important?   These steps will help you keep on track with your medicines.   Notes:         Care Plan : [place]  Updates made by Charlton Haws, RPH since 04/28/2021 12:00 AM     Problem: Hypertension, Hyperlipidemia, Coronary Artery Disease, GERD, Hypothyroidism and Osteoarthritis   Priority: High     Long-Range Goal: Disease management   Start Date: 10/29/2020  Expected End Date: 10/29/2021  This Visit's Progress: On track  Recent Progress: On track  Priority: High  Note:   Current Barriers:  Unable to independently monitor therapeutic efficacy  Pharmacist Clinical Goal(s):  Patient will achieve adherence to monitoring guidelines and medication adherence to achieve therapeutic efficacy through collaboration with PharmD and provider.   Interventions: 1:1 collaboration with [name], MD regarding development and update of comprehensive plan of care as evidenced by provider attestation and co-signature Inter-disciplinary care team collaboration (see longitudinal plan of care) Comprehensive medication review performed; medication list updated in electronic medical record  Hypertension (BP goal <130/80) -Controlled - BP at home is at goal, pt denies side effects; he does report edema/swelling is not under control; he does not follow a low salt diet -Current home readings:  120s/60s -Current treatment: Metoprolol succinate 25 mg daily Losartan-HCTZ 100-25 - 1/2 tab daily Furosemide 40 mg daily Klor-Con 20 mEq daily -Medications previously tried: n/a -Denies hypotensive/hypertensive symptoms -Educated on BP goals and benefits of medications for prevention of heart attack, stroke and kidney damage; Daily salt intake goal < 2300 mg; -Counseled that furosemide causes potassium loss, which is why Kcl supplement is necessary -Counseled to monitor BP at home daily -Recommended to continue current medication and reduce salt  Hyperlipidemia / CAD (LDL goal < 70) -Controlled - LDL is at goal; pt denies side effects -hx cath 3/10 mLAD 30%, pRCA 30%, EF 55-60%; aortic atherosclerosis on CT -Current treatment: Simvastatin 20 mg daily Aspirin 81 mg BID Super Omega 3 plus -Educated on Cholesterol goals; Benefits of statin for ASCVD risk reduction; benefits of aspirin for established CAD -Recommended to continue current medication  GERD (Goal: manage symptoms) -Controlled - pt reports before he started PPI he had severe issues with GERD and vomiting; he has had esophageal dilation and symptoms are stable now -Pt reports he has discussed BID dosing with PCP and was told that was fine; he needs new rx reflecting BID dosing -Current treatment  Omeprazole 40 mg daily - taking BID -Patient is satisfied with current regimen and denies  issues -Recommended to continue current medication; ordered omeprazole 40 mg BID rx to mail order  Depression (Goal: manage symptoms) -Controlled - pt reports mood is improved since taking duloxetine; he has history of frequent crying episodes but these have largely subsided -Current treatment: Duloxetine 30 mg daily -Medications previously tried/failed: n/a -PHQ9: 2 (09/2020) -GAD7: not on file -Connected with PCP for mental health support -Educated on Benefits of medication for symptom control -Recommended to continue current  medication  Pain (Goal: manage symptoms) -Controlled - pt is planning to stop gabapentin when his rx runs outs -osteoarthritis -Current treatment  Gabapentin 300 mg BID - taking 1 daily d/t sleepiness Duloxetine 30 mg daily HS -Counseled to monitor for worsening pain when stopping gabapentin  Health Maintenance -Vaccine gaps: covid - pt reports their daughter will "disown them" if they get the covid vaccine; discussed risks and benefits of vaccine  Patient Goals/Self-Care Activities Patient will:  - take medications as prescribed -focus on medication adherence by pill box -check blood pressure daily -engage in dietary modifications by reducing salt in diet      The patient verbalized understanding of instructions, educational materials, and care plan provided today and declined offer to receive copy of patient instructions, educational materials, and care plan.  Telephone follow up appointment with pharmacy team member scheduled for: 6 months  [name], PharmD, [place], CPP Clinical Pharmacist [facility] [phone]

## 2021-04-28 NOTE — Progress Notes (Signed)
Chronic Care Management Pharmacy Note  04/28/2021 Name:  [name] [name] MRN:  [id number] DOB:  12-Aug-1945  Summary: -Pt reports swelling in feet is worse later in the day; he has not been able to wear compression stockings due to skin irritation -Pt reports he has discussed increasing omeprazole to twice daily with PCP and was told that was fine; he requests new rx to reflect BID dosing  Recommendations/Changes made from today's visit: -Advised to reduce daily sodium intake to < 2000 mg -Ordered omeprazole 40 mg BID rx to mail order per pt request   Subjective: [name] [name] is an 74 y.o. year old male who is a primary patient of [name], MD.  The CCM team was consulted for assistance with disease management and care coordination needs.    Engaged with patient by telephone for follow up visit in response to provider referral for pharmacy case management and/or care coordination services.   Consent to Services:  The patient was given information about Chronic Care Management services, agreed to services, and gave verbal consent prior to initiation of services.  Please see initial visit note for detailed documentation.   Patient Care Team: [name], MD as PCP - General (Internal Medicine) Fay Records, MD as PCP - Cardiology (Cardiology) Wyatt Portela, MD (Hematology and Oncology) Irine Seal, MD (Urology) Kristeen Miss, MD (Neurosurgery) Marygrace Drought, MD as Consulting Physician (Ophthalmology) Bronson Ing, DPM (Podiatry) Gatha Mayer, MD as Consulting Physician (Gastroenterology) Charlton Haws, [facility] as Pharmacist (Pharmacist)   Patient lives at home in wife. He uses walker to get around. He spends most of his time sedentary.  Recent office visits: 03/19/21 Dr [name] OV: c/o cyst on finger; referred to ortho surgery  09/22/20 [name] (PCP) - Hypertension, Hypothyroidism & Hyperlipidemia. No med changes. F/u 6 months.  Recent consult  visits: 04/07/21 Dr Tempie Donning (ortho): f/u cyst on finger - ganglion cyst. Opted for cyst aspiration. F/u PRN  03/20/21 Dr Tempie Donning (ortho): eval cyst on finger. Ordered MRI to evaluate.  11/25/20 PA Kathlen Mody (cardiology): f/u CAD, swelling. Swelling improved w/ stopping gabapentin and starting furosemide. Rec compression stockings.  08/28/20 Ross (Cadiology) - Hypertension & Leg swelling. Changed Furosemide 40 mg & Potassium chloride 20 meq to daily rather than QOD.  08/2020 PT for poor balance.  08/15/20 Ross (Cadiology) - Hypertension & Leg swelling. BP 148/64. Start Losartan-HCTZ 100-25 mg, start furosemide 40 mg & Potassium chloride every other day (increased from twice a week). F/u labs in 10 days.  Hospital visits: None in previous 6 months  Objective:  Lab Results  Component Value Date   CREATININE 0.83 03/19/2021   BUN 16 03/19/2021   GFR 86.41 03/19/2021   GFRNONAA 87 06/10/2020   GFRAA 100 06/10/2020   NA 139 03/19/2021   K 4.1 03/19/2021   CALCIUM 9.8 03/19/2021   CO2 33 (H) 03/19/2021   GLUCOSE 111 (H) 03/19/2021    Lab Results  Component Value Date/Time   HGBA1C 6.3 03/19/2021 10:05 AM   HGBA1C 6.3 09/22/2020 11:51 AM   GFR 86.41 03/19/2021 10:05 AM   GFR 88.35 09/22/2020 11:51 AM    Last diabetic Eye exam: No results found for: HMDIABEYEEXA  Last diabetic Foot exam: No results found for: HMDIABFOOTEX   Lab Results  Component Value Date   CHOL 116 09/22/2020   HDL 33.50 (L) 09/22/2020   LDLCALC 58 09/22/2020   TRIG 122.0 09/22/2020   CHOLHDL 3 09/22/2020  Hepatic Function Latest Ref Rng & Units 09/05/2019 09/18/2018 01/27/2016  Total Protein 6.0 - 8.3 g/dL 8.1 8.1 7.8  Albumin 3.5 - 5.2 g/dL 4.0 4.0 -  AST 0 - 37 U/L 25 18 -  ALT 0 - 53 U/L 24 17 -  Alk Phosphatase 39 - 117 U/L 74 77 -  Total Bilirubin 0.2 - 1.2 mg/dL 0.7 0.6 -  Bilirubin, Direct 0.0 - 0.3 mg/dL 0.1 - -    Lab Results  Component Value Date/Time   TSH 0.37 03/19/2021 10:05 AM   TSH  0.44 09/22/2020 11:51 AM    CBC Latest Ref Rng & Units 03/19/2021 05/12/2020 09/05/2019  WBC 4.0 - 10.5 K/uL 6.3 5.8 5.1  Hemoglobin 13.0 - 17.0 g/dL 14.5 14.7 14.5  Hematocrit 39.0 - 52.0 % 44.3 43.4 44.1  Platelets 150.0 - 400.0 K/uL 226.0 211 208.0    Lab Results  Component Value Date/Time   VD25OH 32.0 01/27/2016 02:40 PM    Clinical ASCVD: Yes  The ASCVD Risk score (Arnett DK, et al., 2019) failed to calculate for the following reasons:   The valid total cholesterol range is 130 to 320 mg/dL    Depression screen [facility] 2/9 10/06/2020 09/22/2020 09/05/2019  Decreased Interest 1 0 1  Down, Depressed, Hopeless 1 0 0  PHQ - 2 Score 2 0 1  Altered sleeping - 2 3  Tired, decreased energy - 2 2  Change in appetite - 1 2  Feeling bad or failure about yourself  - 0 0  Trouble concentrating - 0 3  Moving slowly or fidgety/restless - 0 0  Suicidal thoughts - 0 0  PHQ-9 Score - 5 11  Difficult doing work/chores - Somewhat difficult Very difficult  Some recent data might be hidden     Social History   Tobacco Use  Smoking Status Former   Packs/day: 1.00   Years: 16.00   Pack years: 16.00   Types: Cigarettes   Quit date: 07/17/1974   Years since quitting: 46.8  Smokeless Tobacco Former   BP Readings from Last 3 Encounters:  03/20/21 140/81  03/19/21 126/82  11/25/20 104/64   Pulse Readings from Last 3 Encounters:  03/20/21 71  03/19/21 65  11/25/20 86   Wt Readings from Last 3 Encounters:  03/20/21 266 lb (120.7 kg)  03/19/21 266 lb (120.7 kg)  11/25/20 272 lb 6.4 oz (123.6 kg)   BMI Readings from Last 3 Encounters:  03/20/21 38.17 kg/m  03/19/21 38.17 kg/m  11/25/20 39.09 kg/m    Assessment/Interventions: Review of patient past medical history, allergies, medications, health status, including review of consultants reports, laboratory and other test data, was performed as part of comprehensive evaluation and provision of chronic care management services.   SDOH:   (Social Determinants of Health) assessments and interventions performed: Yes  SDOH Screenings   Alcohol Screen: Low Risk    Last Alcohol Screening Score (AUDIT): 0  Depression (PHQ2-9): Low Risk    PHQ-2 Score: 2  Financial Resource Strain: Low Risk    Difficulty of Paying Living Expenses: Not hard at all  Food Insecurity: No Food Insecurity   Worried About Charity fundraiser in the Last Year: Never true   Ran Out of Food in the Last Year: Never true  Housing: Low Risk    Last Housing Risk Score: 0  Physical Activity: Inactive   Days of Exercise per Week: 0 days   Minutes of Exercise per Session: 0 min  Social Connections:  Moderately Integrated   Frequency of Communication with Friends and Family: More than three times a week   Frequency of Social Gatherings with Friends and Family: More than three times a week   Attends Religious Services: More than 4 times per year   Active Member of [employer] or Organizations: Yes   Attends Archivist Meetings: More than 4 times per year   Marital Status: Widowed  Stress: No Stress Concern Present   Feeling of Stress : Not at all  Tobacco Use: Medium Risk   Smoking Tobacco Use: Former   Smokeless Tobacco Use: Former   Passive Exposure: Not on Pensions consultant Needs: No Transportation Needs   Film/video editor (Medical): No   Lack of Transportation (Non-Medical): No    CCM Care Plan  Allergies  Allergen Reactions   Penicillins Hives and Other (See Comments)    "I break out in welts" Has patient had a PCN reaction causing immediate rash, facial/tongue/throat swelling, SOB or lightheadedness with hypotension: No Has patient had a PCN reaction causing severe rash involving mucus membranes or skin necrosis: No Has patient had a PCN reaction that required hospitalization No Has patient had a PCN reaction occurring within the last 10 years: No If all of the above answers are "NO", then may proceed with Cephalosporin use.    Codeine Nausea And Vomiting   Sulfonamide Derivatives Nausea And Vomiting    Medications Reviewed Today     Reviewed by Charlton Haws, [facility] (Pharmacist) on 04/28/21 at [place] List Status: <None>   Medication Order Taking? Sig Documenting Provider Last Dose Status Informant  aspirin EC 81 MG tablet [id number] Yes Take 1 tablet (81 mg total) by mouth 2 (two) times daily. Thurnell Lose, MD Taking Active   cholecalciferol (VITAMIN D3) 25 MCG (1000 UNIT) tablet [id number] Yes Take 2,000 Units by mouth daily. [provider] Taking Active   DULoxetine (CYMBALTA) 30 MG capsule [id number] Yes TAKE 1 CAPSULE BY MOUTH  DAILY [name], MD Taking Active   furosemide (LASIX) 40 MG tablet [id number] Yes Take 1 tablet (40 mg total) by mouth daily. Fay Records, MD Taking Active   gabapentin (NEURONTIN) 300 MG capsule [id number] Yes Take 300 mg by mouth at bedtime. [provider] Taking Active   levothyroxine (SYNTHROID) 150 MCG tablet [id number] Yes TAKE 1 TABLET BY MOUTH  DAILY BEFORE BREAKFAST [name], MD Taking Active   losartan-hydrochlorothiazide [facility]) 100-25 MG tablet [id number] Yes Take 0.5 tablets by mouth daily. Fay Records, MD Taking Active   metoprolol succinate (TOPROL-XL) 25 MG 24 hr tablet [id number] Yes TAKE 1 TABLET BY MOUTH  DAILY Fay Records, MD Taking Active   Multiple Vitamins-Minerals (ICAPS AREDS 2 PO) [id number] Yes Take 1 capsule by mouth daily. [provider] Taking Active   Omega-3 Fatty Acids (SUPER OMEGA-3) 1000 MG CAPS [id number] Yes Take by mouth. [provider] Taking Active   omeprazole (PRILOSEC) 40 MG capsule [id number] Yes TAKE 1 CAPSULE BY MOUTH  DAILY 30 MINUTES BEFORE  BREAKFAST [name], MD Taking Active   polyethylene glycol (MIRALAX / GLYCOLAX) 17 g packet [id number] Yes Take 17 g by mouth daily. [provider] Taking Active   potassium chloride SA (KLOR-CON) 20 MEQ tablet [id number] Yes Take  1 tablet (20 mEq total) by mouth daily. Fay Records, MD Taking Active   simvastatin (ZOCOR) 20 MG tablet [id number] Yes TAKE 1 TABLET BY MOUTH  DAILY AT  6 PM. 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   Onychomycosis of toenail 09/05/2019   Ataxia involving legs 09/05/2019   Benign prostatic hyperplasia without lower urinary tract symptoms 09/18/2018   Prediabetes 09/18/2018   Claudication of both lower extremities ([place]) 09/18/2018   Hyperlipidemia LDL goal <70 09/18/2018   Pure hyperglyceridemia 09/18/2018   Routine general medical examination at a health care facility 09/18/2018   GERD (gastroesophageal reflux disease)    CAD (coronary artery disease)    OSA (obstructive sleep apnea) 09/18/2015   Mild concentric left ventricular hypertrophy (LVH) 09/18/2015   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[place]) 09/18/2015   Hypothyroidism 09/18/2015   Chronic idiopathic constipation 08/18/2015   Primary localized osteoarthritis of right knee 08/08/2015   Pulmonary nodule 06/13/2012   CAD, NATIVE VESSEL 06/18/2009   Essential hypertension 07/14/2007    Immunization History  Administered Date(s) Administered   Fluad Quad(high Dose 65+) 03/08/2019, 03/19/2021   Influenza Split 03/28/2013   Influenza, High Dose Seasonal PF 03/28/2018   Influenza,inj,Quad PF,6+ Mos 02/26/2020   Influenza-Unspecified 03/29/2011   PPD Test 08/14/2015   Pneumococcal Conjugate-13 07/03/2014   Pneumococcal Polysaccharide-23 02/19/2013, 03/08/2019   Tdap 07/15/2008, 09/18/2018   Zoster Recombinat (Shingrix) 02/23/2018, 05/03/2018    Conditions to be addressed/monitored:  Hypertension, Hyperlipidemia, Coronary Artery Disease, GERD, Hypothyroidism and  Osteoarthritis  Care Plan : CCM Pharmacy Care Plan  Updates made by Charlton Haws, [place] since 04/28/2021 12:00 AM     Problem: Hypertension, Hyperlipidemia, Coronary Artery Disease, GERD, Hypothyroidism and Osteoarthritis   Priority: High     Long-Range Goal: Disease management   Start Date: 10/29/2020  Expected End Date: 10/29/2021  This Visit's Progress: On track  Recent Progress: On track  Priority: High  Note:   Current Barriers:  Unable to independently monitor therapeutic efficacy  Pharmacist Clinical Goal(s):  Patient will achieve adherence to monitoring guidelines and medication adherence to achieve therapeutic efficacy through collaboration with PharmD and provider.   Interventions: 1:1 collaboration with [name], MD regarding development and update of comprehensive plan of care as evidenced by provider attestation and co-signature Inter-disciplinary care team collaboration (see longitudinal plan of care) Comprehensive medication review performed; medication list updated in electronic medical record  Hypertension (BP goal <130/80) -Controlled - BP at home is at goal, pt denies side effects; he does report edema/swelling is not under control; he does not follow a low salt diet -Current home readings: 120s/60s -Current treatment: Metoprolol succinate 25 mg daily Losartan-HCTZ 100-25 - 1/2 tab daily Furosemide 40 mg daily Klor-Con 20 mEq daily -Medications previously tried: n/a -Denies hypotensive/hypertensive symptoms -Educated on BP goals and benefits of medications for prevention of heart attack, stroke and kidney damage; Daily salt intake goal < 2300 mg; -Counseled that furosemide causes potassium loss, which is why Kcl supplement is necessary -Counseled to monitor BP at home daily -Recommended to continue current medication and reduce salt  Hyperlipidemia / CAD (LDL goal < 70) -Controlled - LDL is at goal; pt denies side effects -hx cath 3/10 mLAD 30%,  pRCA 30%, EF 55-60%; aortic atherosclerosis on CT -Current treatment: Simvastatin 20 mg daily Aspirin 81 mg BID Super Omega  3 plus -Educated on Cholesterol goals; Benefits of statin for ASCVD risk reduction; benefits of aspirin for established CAD -Recommended to continue current medication  GERD (Goal: manage symptoms) -Controlled - pt reports before he started PPI he had severe issues with GERD and vomiting; he has had esophageal dilation and symptoms are stable now -Pt reports he has discussed BID dosing with PCP and was told that was fine; he needs new rx reflecting BID dosing -Current treatment  Omeprazole 40 mg daily - taking BID -Patient is satisfied with current regimen and denies issues -Recommended to continue current medication; ordered omeprazole 40 mg BID rx to mail order  Depression (Goal: manage symptoms) -Controlled - pt reports mood is improved since taking duloxetine; he has history of frequent crying episodes but these have largely subsided -Current treatment: Duloxetine 30 mg daily -Medications previously tried/failed: n/a -PHQ9: 2 (09/2020) -GAD7: not on file -Connected with PCP for mental health support -Educated on Benefits of medication for symptom control -Recommended to continue current medication  Pain (Goal: manage symptoms) -Controlled - pt is planning to stop gabapentin when his rx runs outs -osteoarthritis -Current treatment  Gabapentin 300 mg BID - taking 1 daily d/t sleepiness Duloxetine 30 mg daily HS -Counseled to monitor for worsening pain when stopping gabapentin  Health Maintenance -Vaccine gaps: covid - pt reports their daughter will "disown them" if they get the covid vaccine; discussed risks and benefits of vaccine  Patient Goals/Self-Care Activities Patient will:  - take medications as prescribed -focus on medication adherence by pill box -check blood pressure daily -engage in dietary modifications by reducing salt in diet       Compliance/Adherence/Medication fill history: Care Gaps: None  Star-Rating Drugs: Losartan-HCTZ - LF 01/26/21 x 90 ds Simvastatin - LF 01/26/21 x 90 ds  Medication Assistance: None required.  Patient affirms current coverage meets needs.  Patient's preferred pharmacy is:  [id number] [place], [place]Phone: [phone]: [phone] [facility]  ([place]EC[address] Plains[address]Phone: [phone]: [phone] [facility] ([facility]) - [place] [address] [address]Phone: [phone]: [phone] Uses pill box? Yes Pt endorses 100% compliance  We discussed: Current pharmacy is preferred with insurance plan and patient is satisfied with pharmacy services Patient decided to: Continue current medication management strategy  Care Plan and Follow Up Patient Decision:  Patient agrees to Care Plan and Follow-up.  Plan: Telephone follow up appointment with care management team member scheduled for:  6 months  LCharlene Brooke PharmD, [place] CPP Clinical Pharmacist [place] [phone]

## 2021-05-13 ENCOUNTER — Other Ambulatory Visit: Payer: Self-pay

## 2021-05-13 MED ORDER — FUROSEMIDE 40 MG PO TABS: 40.0000 mg | ORAL_TABLET | Freq: Every day | ORAL | 1 refills | Status: DC

## 2021-05-13 MED ORDER — POTASSIUM CHLORIDE CRYS ER 20 MEQ PO TBCR: 20.0000 meq | EXTENDED_RELEASE_TABLET | Freq: Every day | ORAL | 1 refills | Status: DC

## 2021-05-27 DIAGNOSIS — I10 Essential (primary) hypertension: Secondary | ICD-10-CM | POA: Diagnosis not present

## 2021-05-27 DIAGNOSIS — E785 Hyperlipidemia, unspecified: Secondary | ICD-10-CM | POA: Diagnosis not present

## 2021-05-27 DIAGNOSIS — I2510 Atherosclerotic heart disease of native coronary artery without angina pectoris: Secondary | ICD-10-CM

## 2021-08-12 ENCOUNTER — Encounter: Payer: Self-pay | Admitting: Internal Medicine

## 2021-08-12 ENCOUNTER — Ambulatory Visit: Payer: Medicare Other | Admitting: Internal Medicine

## 2021-08-12 ENCOUNTER — Other Ambulatory Visit: Payer: Self-pay

## 2021-08-12 VITALS — BP 119/65 | HR 67 | Ht 70.0 in | Wt 263.0 lb

## 2021-08-12 DIAGNOSIS — M7989 Other specified soft tissue disorders: Secondary | ICD-10-CM

## 2021-08-12 DIAGNOSIS — I10 Essential (primary) hypertension: Secondary | ICD-10-CM | POA: Diagnosis not present

## 2021-08-12 DIAGNOSIS — Z79899 Other long term (current) drug therapy: Secondary | ICD-10-CM

## 2021-08-12 DIAGNOSIS — I2510 Atherosclerotic heart disease of native coronary artery without angina pectoris: Secondary | ICD-10-CM

## 2021-08-12 MED ORDER — METOPROLOL TARTRATE 50 MG PO TABS: ORAL_TABLET | ORAL | 0 refills | Status: DC

## 2021-08-12 MED ORDER — ASPIRIN EC 81 MG PO TBEC: 81.0000 mg | DELAYED_RELEASE_TABLET | Freq: Two times a day (BID) | ORAL | Status: AC

## 2021-08-12 NOTE — Patient Instructions (Addendum)
Medication Instructions:  Your physician recommends that you continue on your current medications as directed. Please refer to the Current Medication list given to you today.  *If you need a refill on your cardiac medications before your next appointment, please call your pharmacy*   Lab Work: Bmet, lipid, vit d  prior to Cardiac CT  If you have labs (blood work) drawn today and your tests are completely normal, you will receive your results only by: [place] (if you have MyChart) OR A paper copy in the mail If you have any lab test that is abnormal or we need to change your treatment, we will call you to review the results.   Testing/Procedures:   Your cardiac CT will be scheduled at one of the below locations:   [facility] [address] [place], [place] [address] [phone]  [place] [address] [place], [place] [address] [phone]  If scheduled at [facility], please arrive at the [facility] main entrance (entrance A) of [facility] 30 minutes prior to test start time. You can use the FREE valet parking offered at the main entrance (encouraged to control the heart rate for the test) Proceed to the [facility] Radiology Department (first floor) to check-in and test prep.  If scheduled at [facility], please arrive 15 mins early for check-in and test prep.  Please follow these instructions carefully (unless otherwise directed):  Hold all erectile dysfunction medications at least 3 days (72 hrs) prior to test.  On the Night Before the Test: Be sure to Drink plenty of water. Do not consume any caffeinated/decaffeinated beverages or chocolate 12 hours prior to your test. Do not take any antihistamines 12 hours prior to your test.  On the Day of the Test: Drink plenty of water until 1 hour prior to the test. Do not eat any food 4 hours prior to the  test. You may take your regular medications prior to the test.  Take metoprolol (Lopressor) two hours prior to test. Sent to your pharmacy.  HOLD Furosemide/Hydrochlorothiazide morning of the test.       After the Test: Drink plenty of water. After receiving IV contrast, you may experience a mild flushed feeling. This is normal. On occasion, you may experience a mild rash up to 24 hours after the test. This is not dangerous. If this occurs, you can take Benadryl 25 mg and increase your fluid intake. If you experience trouble breathing, this can be serious. If it is severe call 911 IMMEDIATELY. If it is mild, please call our office. If you take any of these medications: Glipizide/Metformin, Avandament, Glucavance, please do not take 48 hours after completing test unless otherwise instructed.  We will call to schedule your test 2-4 weeks out understanding that some insurance companies will need an authorization prior to the service being performed.   For non-scheduling related questions, please contact the cardiac imaging nurse navigator should you have any questions/concerns: Marchia Bond, Cardiac Imaging Nurse Navigator Gordy Clement, Cardiac Imaging Nurse Navigator [facility] Direct Office Dial: [phone]   For scheduling needs, including cancellations and rescheduling, please call [place], [phone].    Follow-Up: At [facility], you and your health needs are our priority.  As part of our continuing mission to provide you with exceptional heart care, we have created designated Provider Care Teams.  These Care Teams include your primary Cardiologist (physician) and  Advanced Practice Providers (APPs -  Physician Assistants and Nurse Practitioners) who all work together to provide you with the care you need, when you need it.  We recommend signing up for the patient portal called "MyChart".  Sign up information is provided on this After Visit Summary.   MyChart is used to connect with patients for Virtual Visits (Telemedicine).  Patients are able to view lab/test results, encounter notes, upcoming appointments, etc.  Non-urgent messages can be sent to your provider as well.   To learn more about what you can do with MyChart, go to [email or web address].    Your next appointment:   1 year(s)  The format for your next appointment:   In Person  Provider:   [name], MD     Other Instructions

## 2021-08-12 NOTE — Progress Notes (Signed)
Cardiology Office Note   Date:  08/12/2021   ID:  [name] [name], DOB 1946/04/16, MRN [id number]  PCP:  [name], MD  Cardiologist:   [name], MD   F/U of CAD     History of Present Illness: [name] [name] is a 76 y.o. 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   Improved with lasix     Since seen he says he still gets SOB with adtiviity   Denies CP  Does get occasional dizziness  No syncope     Edema in legs is up/down    Current Meds  Medication Sig   cholecalciferol (VITAMIN D3) 25 MCG (1000 UNIT) tablet Take 2,000 Units by mouth daily.   DULoxetine (CYMBALTA) 30 MG capsule TAKE 1 CAPSULE BY MOUTH  DAILY   furosemide (LASIX) 40 MG tablet Take 1 tablet (40 mg total) by mouth daily.   gabapentin (NEURONTIN) 300 MG capsule Take 300 mg by mouth at bedtime.   levothyroxine (SYNTHROID) 150 MCG tablet TAKE 1 TABLET BY MOUTH  DAILY BEFORE BREAKFAST   losartan-hydrochlorothiazide (HYZAAR) 100-25 MG tablet Take 0.5 tablets by mouth daily.   metoprolol succinate (TOPROL-XL) 25 MG 24 hr tablet TAKE 1 TABLET BY MOUTH  DAILY   Multiple Vitamins-Minerals (ICAPS AREDS 2 PO) Take 1 capsule by mouth daily.   Omega-3 Fatty Acids (SUPER OMEGA-3) 1000 MG CAPS Take by mouth.   omeprazole (PRILOSEC) 40 MG capsule Take 1 capsule (40 mg total) by mouth 2 (two) times daily.   polyethylene glycol (MIRALAX / GLYCOLAX) 17 g packet Take 17 g by mouth daily.   potassium chloride SA (KLOR-CON) 20 MEQ tablet Take 1 tablet (20 mEq total) by mouth daily.   simvastatin (ZOCOR) 20 MG tablet TAKE 1 TABLET BY MOUTH  DAILY AT 6 PM.   [DISCONTINUED] aspirin EC 81 MG tablet Take 1 tablet (81 mg total) by mouth 2 (two) times daily.     Allergies:   Penicillins, Codeine, and Sulfonamide derivatives   Past Medical  History:  Diagnosis Date   Arthritis    CAD (coronary artery disease)    cath 3/10: mLAD 30%, pRCA 30%, EF 55-60% Dr. Harrington Challenger cardiologist  most recent  stress stress done ~ 2 years ago with Dr. Harrington Challenger   Complication of anesthesia    "he usually gets an ileus after back OR"    Depression    Dyspnea    -PFTs compeltely normal 03/20/08 including DLc0   Environmental allergies    Dust, Smoke   GERD (gastroesophageal reflux disease)    HTN (hypertension)    Hypothyroidism    Low testosterone    Observed sleep apnea    can't wear cpap   Polycythemia    PONV (postoperative nausea and vomiting)    Pulmonary nodule 06/13/2012   Rosacea    Sleep apnea    Most recent sleep study 2010; records at [facility]    Past Surgical History:  Procedure Laterality Date   BACK SURGERY  2005; 06/2006; 01/2007; 04/2009   CARDIAC CATHETERIZATION  08/2008   CATARACT EXTRACTION, BILATERAL  2010   CERVICAL FUSION  04/2002   ?C3-4   COLONOSCOPY  12/2005   normal  screening study.    ESOPHAGOGASTRODUODENOSCOPY  multiple   last 07/2012 GERD esophagitis, 48 Fr dilation   ESOPHAGOGASTRODUODENOSCOPY N/A 09/19/2015   Procedure: ESOPHAGOGASTRODUODENOSCOPY (EGD);  Surgeon: [name], MD;  [facility];  Service: Endoscopy;  Laterality: N/A;   KNEE ARTHROSCOPY WITH MEDIAL MENISECTOMY Right 05/14/2015   Procedure: RIGHT KNEE ARTHROSCOPY CHONDROPLASTY, PARTIAL  MEDIAL MENISECTOMY;  Surgeon: [name], MD;  Location: [facility];  Service: Orthopedics;  Laterality: Right;   left knee replacement     left shoudler clean     LUMBAR DISC SURGERY  06/2006   L3   MENISCUS REPAIR  08/2008   left   NECK SURGERY     POSTERIOR FUSION LUMBAR SPINE  2005; 01/2007; 04/2009   L5; L3-4; L2-3   PROSTATE SURGERY  2000   SAVORY DILATION N/A 09/19/2015   Procedure: SAVORY DILATION;  Surgeon: [name], MD;  Location: [facility] ENDOSCOPY;  Service: Endoscopy;  Laterality: N/A;   SHOULDER OPEN  ROTATOR CUFF REPAIR Left 1999   left   TONSILLECTOMY  09/21/11   TOTAL KNEE ARTHROPLASTY Left 01/2011   left   TOTAL KNEE ARTHROPLASTY Right 08/08/2015   Procedure: RIGHT TOTAL KNEE ARTHROPLASTY;  Surgeon: [name], MD;  Location: [facility];  Service: Orthopedics;  Laterality: Right;   TRANSURETHRAL RESECTION OF PROSTATE  2006   followed by "surgery to get rid of clots"   UVULOPALATOPHARYNGOPLASTY, TONSILLECTOMY AND SEPTOPLASTY  09/21/11   Deviated Septum     Social History:  The patient  reports that he quit smoking about 47 years ago. His smoking use included cigarettes. He has a 16.00 pack-year smoking history. He has quit using smokeless tobacco. He reports that he does not drink alcohol and does not use drugs.   Family History:  The patient's family history includes Alzheimer's disease in his cousin and father; Bipolar disorder in an other family member; Breast cancer in his mother and sister; Breast cancer (age of onset: 68) in his sister; Cirrhosis in his maternal uncle; Colon polyps in his mother; Diabetes in his mother and sister; Heart Problems in his sister; Heart attack in his cousin, father, and paternal grandfather; Liver cancer in his maternal uncle; Lung cancer in his paternal uncle, paternal uncle, and paternal uncle; Other in his daughter and sister; Parkinson's disease in an other family member; Stroke in his paternal grandmother; Throat cancer in his maternal uncle.    ROS:  Please see the history of present illness. All other systems are reviewed and  Negative to the above problem except as noted.    PHYSICAL EXAM: VS:  BP 119/65    Pulse 67    Ht 5\' 10"  (1.778 m)    Wt 263 lb (119.3 kg)    BMI 37.74 kg/m   GEN: Morbidly obese 76 yo in no acute distress   Examined in chair HEENT: normal  Neck: JVP is normal   Cardiac: RRR; No Sgnif murmurs   1+ LE edema with chronic skin changes  Respiratory:  clear to auscultation bilaterally GI: Obese.  Distended.  Nontender. MS:  No deformity     Skin: warm and dry, Feet calves are minimally erythematous Neuro:  CN II to XII intact   Psych: euthymic mood, full affect   EKG:  EKG shows SR with PACs, PVCs   RBBB  LAFB   LVH      Component Value Date/Time   CHOL 116 09/22/2020 1151   TRIG 122.0 09/22/2020 1151  HDL 33.50 (L) 09/22/2020 1151   CHOLHDL 3 09/22/2020 1151   VLDL 24.4 09/22/2020 1151   LDLCALC 58 09/22/2020 1151      Wt Readings from Last 3 Encounters:  08/12/21 263 lb (119.3 kg)  03/20/21 266 lb (120.7 kg)  03/19/21 266 lb (120.7 kg)      ASSESSMENT AND PLAN:   1  LE edema   Pt still with LE edema most likely due to venous insuff   Watch salt.   Limit fluids   Cant get compression hose on     2 CAD.  Nonobstructive dz  Denies CP  BUT concerning is the continued DOE    ? Anginal equivalent   Would like to set up for CT coronary angiogram    3  HTN   Keep on  current meds  BP contrlled  4  Aorta  Repeat eval when do CT   5  Lipids.  Continue statin LDL 58  HDL 34  (March 2022)   Check on return     Current medicines are reviewed at length with the patient today.  The patient does not have concerns regarding medicines.  Signed, [name], MD  08/12/2021 9:40 AM    [address], [place], [place]  [address] Phone: [phone]; Fax: [phone]

## 2021-08-13 ENCOUNTER — Other Ambulatory Visit: Payer: Self-pay | Admitting: Internal Medicine

## 2021-08-13 DIAGNOSIS — I77810 Thoracic aortic ectasia: Secondary | ICD-10-CM

## 2021-08-13 DIAGNOSIS — E785 Hyperlipidemia, unspecified: Secondary | ICD-10-CM

## 2021-08-13 DIAGNOSIS — E039 Hypothyroidism, unspecified: Secondary | ICD-10-CM

## 2021-08-26 ENCOUNTER — Other Ambulatory Visit: Payer: Self-pay

## 2021-08-26 ENCOUNTER — Other Ambulatory Visit: Payer: Medicare Other | Admitting: *Deleted

## 2021-08-26 DIAGNOSIS — Z79899 Other long term (current) drug therapy: Secondary | ICD-10-CM

## 2021-08-26 DIAGNOSIS — I2510 Atherosclerotic heart disease of native coronary artery without angina pectoris: Secondary | ICD-10-CM

## 2021-08-26 DIAGNOSIS — M7989 Other specified soft tissue disorders: Secondary | ICD-10-CM

## 2021-08-26 DIAGNOSIS — I10 Essential (primary) hypertension: Secondary | ICD-10-CM

## 2021-08-27 LAB — BASIC METABOLIC PANEL
BUN/Creatinine Ratio: 19 (ref 10–24)
BUN: 18 mg/dL (ref 8–27)
CO2: 28 mmol/L (ref 20–29)
Calcium: 9.8 mg/dL (ref 8.6–10.2)
Chloride: 101 mmol/L (ref 96–106)
Creatinine, Ser: 0.93 mg/dL (ref 0.76–1.27)
Glucose: 97 mg/dL (ref 70–99)
Potassium: 4.5 mmol/L (ref 3.5–5.2)
Sodium: 142 mmol/L (ref 134–144)
eGFR: 86 mL/min/{1.73_m2} (ref >59)

## 2021-08-27 LAB — LIPID PANEL
Chol/HDL Ratio: 3.3 ratio (ref 0.0–5.0)
Cholesterol, Total: 107 mg/dL (ref 100–199)
HDL: 32 mg/dL — ABNORMAL LOW (ref >39)
LDL Chol Calc (NIH): 56 mg/dL (ref 0–99)
Triglycerides: 97 mg/dL (ref 0–149)
VLDL Cholesterol Cal: 19 mg/dL (ref 5–40)

## 2021-08-27 LAB — VITAMIN D 25 HYDROXY (VIT D DEFICIENCY, FRACTURES): Vit D, 25-Hydroxy: 43.8 ng/mL (ref 30.0–100.0)

## 2021-09-01 ENCOUNTER — Telehealth (HOSPITAL_COMMUNITY): Payer: Self-pay | Admitting: Emergency Medicine

## 2021-09-01 NOTE — Telephone Encounter (Signed)
Reaching out to patient to offer assistance regarding upcoming cardiac imaging study; pt verbalizes understanding of appt date/time, parking situation and where to check in, pre-test NPO status and medications ordered, and verified current allergies; name and call back number provided for further questions should they arise ?Marchia Bond RN Navigator Cardiac Imaging ?[facility] ?[phone] office ?[phone] cell ? ?'50mg'$  metoprolol  ?Denies iv issues ? ?

## 2021-09-02 ENCOUNTER — Encounter (HOSPITAL_COMMUNITY): Payer: Self-pay

## 2021-09-02 ENCOUNTER — Other Ambulatory Visit: Payer: Self-pay

## 2021-09-02 ENCOUNTER — Ambulatory Visit (HOSPITAL_COMMUNITY)
Admission: RE | Admit: 2021-09-02 | Discharge: 2021-09-02 | Disposition: A | Discharge status: Home / Self Care | Payer: Medicare Other | Source: Ambulatory Visit | Attending: Internal Medicine | Admitting: Internal Medicine

## 2021-09-02 DIAGNOSIS — M7989 Other specified soft tissue disorders: Secondary | ICD-10-CM | POA: Insufficient documentation

## 2021-09-02 DIAGNOSIS — I10 Essential (primary) hypertension: Secondary | ICD-10-CM | POA: Insufficient documentation

## 2021-09-02 DIAGNOSIS — I2510 Atherosclerotic heart disease of native coronary artery without angina pectoris: Secondary | ICD-10-CM | POA: Diagnosis not present

## 2021-09-02 DIAGNOSIS — Z79899 Other long term (current) drug therapy: Secondary | ICD-10-CM | POA: Insufficient documentation

## 2021-09-02 MED ORDER — NITROGLYCERIN 0.4 MG SL SUBL
0.8000 mg | SUBLINGUAL_TABLET | Freq: Once | SUBLINGUAL | Status: AC
Start: 2021-09-02 — End: 2021-09-02
  Administered 2021-09-02: 0.8 mg via SUBLINGUAL

## 2021-09-02 MED ORDER — NITROGLYCERIN 0.4 MG SL SUBL
SUBLINGUAL_TABLET | SUBLINGUAL | Status: AC
  Filled 2021-09-02: qty 2

## 2021-09-02 MED ORDER — IOHEXOL 350 MG/ML SOLN
100.0000 mL | Freq: Once | INTRAVENOUS | Status: AC | PRN
  Administered 2021-09-02: 100 mL via INTRAVENOUS

## 2021-09-11 ENCOUNTER — Telehealth: Payer: Self-pay

## 2021-09-11 NOTE — Progress Notes (Signed)
? ? ?  Chronic Care Management ?Pharmacy Assistant  ? ?Name: [name] [name]  MRN: [id number] DOB: [birth year] ? ?[name] [name] is an 76 y.o. year old male who presents for his follow-up CCM visit with the clinical pharmacist. ? ?Reason for Encounter: Disease State-General ?  ? ?Recent office visits:  ?None ID ? ?Recent consult visits:  ?08/12/21 Fay Records, MD-Cardiology (Swelling) No orders or med changes ? ?Hospital visits:  ?None in previous 6 months ? ?Medications: ?Outpatient Encounter Medications as of 09/11/2021  ?Medication Sig  ? aspirin EC 81 MG tablet Take 1 tablet (81 mg total) by mouth 2 (two) times daily.  ? cholecalciferol (VITAMIN D3) 25 MCG (1000 UNIT) tablet Take 2,000 Units by mouth daily.  ? DULoxetine (CYMBALTA) 30 MG capsule TAKE 1 CAPSULE BY MOUTH  DAILY  ? furosemide (LASIX) 40 MG tablet Take 1 tablet (40 mg total) by mouth daily.  ? gabapentin (NEURONTIN) 300 MG capsule Take 300 mg by mouth at bedtime.  ? levothyroxine (SYNTHROID) 150 MCG tablet TAKE 1 TABLET BY MOUTH  DAILY BEFORE BREAKFAST  ? losartan-hydrochlorothiazide (HYZAAR) 100-25 MG tablet Take 0.5 tablets by mouth daily.  ? metoprolol succinate (TOPROL-XL) 25 MG 24 hr tablet TAKE 1 TABLET BY MOUTH  DAILY  ? metoprolol tartrate (LOPRESSOR) 50 MG tablet Take one tablet 50 mg two hours prior to your Cardiac CT.  ? Multiple Vitamins-Minerals (ICAPS AREDS 2 PO) Take 1 capsule by mouth daily.  ? Omega-3 Fatty Acids (SUPER OMEGA-3) 1000 MG CAPS Take by mouth.  ? omeprazole (PRILOSEC) 40 MG capsule Take 1 capsule (40 mg total) by mouth 2 (two) times daily.  ? polyethylene glycol (MIRALAX / GLYCOLAX) 17 g packet Take 17 g by mouth daily.  ? potassium chloride SA (KLOR-CON) 20 MEQ tablet Take 1 tablet (20 mEq total) by mouth daily.  ? simvastatin (ZOCOR) 20 MG tablet TAKE 1 TABLET BY MOUTH  DAILY AT 6 PM.  ? ?No facility-administered encounter medications on file as of 09/11/2021.  ? ?Have you had any problems recently with your health?Spoke with  patient wife who states that patient is still having some swelling in legs and feet. She stated that she read Gabapentin also causes swelling and that he may just stop taking it. She states that patient has cut salt intake does not add to any foods. ? ?Have you had any problems with your pharmacy?She states that patient does not have any problems with getting medications from the pharmacy or the cost of medications. She states that they pay about $115 every 3 months for all his medications ? ?What issues or side effects are you having with your medications? She states that patient is not having any side effects to the medications but just think that gabapentin is causing some swelling ? ?What would you like me to pass along to [facility] clinical pharmacist, for them to help you with? She states that the patient is getting along well and that he is seeing Dr. Harrington Challenger for the swelling.  ? ?What can we do to take care of you better?She states that the patient does not need anything at this time ?  ?Care Gaps: ?Colonoscopy-01/20/16 ?Diabetic Foot Exam-NA ?Ophthalmology-NA ?Dexa Scan - NA ?Annual Well Visit - NA ?Micro albumin-NA ?Hemoglobin A1c- 03/19/21 ? ?Star Rating Drugs: ?Simvastatin 20 mg-last fill ?Losartan-hctz 100-25 mg-last fill  ? ?Ethelene Hal ?Clinical Pharmacist Assistant ?[phone]  ?

## 2021-09-22 ENCOUNTER — Ambulatory Visit: Payer: Medicare Other | Admitting: Internal Medicine

## 2021-09-22 ENCOUNTER — Encounter: Payer: Self-pay | Admitting: Internal Medicine

## 2021-09-22 VITALS — BP 116/64 | HR 71 | Temp 98.3°F | Ht 70.0 in | Wt 261.0 lb

## 2021-09-22 DIAGNOSIS — N400 Enlarged prostate without lower urinary tract symptoms: Secondary | ICD-10-CM | POA: Diagnosis not present

## 2021-09-22 DIAGNOSIS — I10 Essential (primary) hypertension: Secondary | ICD-10-CM

## 2021-09-22 DIAGNOSIS — R7303 Prediabetes: Secondary | ICD-10-CM | POA: Diagnosis not present

## 2021-09-22 DIAGNOSIS — E039 Hypothyroidism, unspecified: Secondary | ICD-10-CM

## 2021-09-22 DIAGNOSIS — Z0001 Encounter for general adult medical examination with abnormal findings: Secondary | ICD-10-CM

## 2021-09-22 DIAGNOSIS — I700 Atherosclerosis of aorta: Secondary | ICD-10-CM

## 2021-09-22 LAB — TSH: TSH: 0.15 u[IU]/mL — ABNORMAL LOW (ref 0.35–5.50)

## 2021-09-22 LAB — PSA: PSA: 0.25 ng/mL (ref 0.10–4.00)

## 2021-09-22 LAB — HEMOGLOBIN A1C: Hgb A1c MFr Bld: 6.1 % (ref 4.6–6.5)

## 2021-09-22 MED ORDER — LEVOTHYROXINE SODIUM 125 MCG PO TABS: 125.0000 ug | ORAL_TABLET | Freq: Every day | ORAL | 1 refills | Status: DC

## 2021-09-22 NOTE — Progress Notes (Signed)
? ?Subjective:  ?Patient ID: [name] [name], male    DOB: 01/21/1946  Age: 76 y.o. MRN: [id number] ? ?CC: Hypothyroidism and Annual Exam ? ?This visit occurred during the SARS-CoV-2 public health emergency.  Safety protocols were in place, including screening questions prior to the visit, additional usage of staff PPE, and extensive cleaning of exam room while observing appropriate contact time as indicated for disinfecting solutions.   ? ?HPI ?[name] [name] presents for a CPX and f/up - ? ?He continues to c/o of SOB and LE edema and was recently seen by cardiology. ? ?Outpatient Medications Prior to Visit  ?Medication Sig Dispense Refill  ? aspirin EC 81 MG tablet Take 1 tablet (81 mg total) by mouth 2 (two) times daily. 30 tablet   ? cholecalciferol (VITAMIN D3) 25 MCG (1000 UNIT) tablet Take 2,000 Units by mouth daily.    ? DULoxetine (CYMBALTA) 30 MG capsule TAKE 1 CAPSULE BY MOUTH  DAILY 90 capsule 0  ? furosemide (LASIX) 40 MG tablet Take 1 tablet (40 mg total) by mouth daily. 90 tablet 1  ? gabapentin (NEURONTIN) 300 MG capsule Take 300 mg by mouth at bedtime.    ? losartan-hydrochlorothiazide (HYZAAR) 100-25 MG tablet Take 0.5 tablets by mouth daily. 45 tablet 2  ? metoprolol succinate (TOPROL-XL) 25 MG 24 hr tablet TAKE 1 TABLET BY MOUTH  DAILY 90 tablet 3  ? metoprolol tartrate (LOPRESSOR) 50 MG tablet Take one tablet 50 mg two hours prior to your Cardiac CT. 1 tablet 0  ? Multiple Vitamins-Minerals (ICAPS AREDS 2 PO) Take 1 capsule by mouth daily.    ? Omega-3 Fatty Acids (SUPER OMEGA-3) 1000 MG CAPS Take by mouth.    ? omeprazole (PRILOSEC) 40 MG capsule Take 1 capsule (40 mg total) by mouth 2 (two) times daily. 180 capsule 1  ? polyethylene glycol (MIRALAX / GLYCOLAX) 17 g packet Take 17 g by mouth daily.    ? potassium chloride SA (KLOR-CON) 20 MEQ tablet Take 1 tablet (20 mEq total) by mouth daily. 90 tablet 1  ? simvastatin (ZOCOR) 20 MG tablet TAKE 1 TABLET BY MOUTH  DAILY AT 6 PM. 90 tablet 0  ?  levothyroxine (SYNTHROID) 150 MCG tablet TAKE 1 TABLET BY MOUTH  DAILY BEFORE BREAKFAST 90 tablet 0  ? ?No facility-administered medications prior to visit.  ? ? ?ROS ?Review of Systems  ?Constitutional:  Negative for chills, diaphoresis, fatigue and fever.  ?HENT: Negative.    ?Eyes: Negative.   ?Respiratory:  Positive for apnea and shortness of breath. Negative for cough, chest tightness and wheezing.   ?Cardiovascular:  Positive for leg swelling. Negative for chest pain and palpitations.  ?Gastrointestinal:  Negative for abdominal pain, constipation, diarrhea, nausea and vomiting.  ?Endocrine: Negative.  Negative for cold intolerance and heat intolerance.  ?Genitourinary: Negative.  Negative for difficulty urinating and dysuria.  ?Musculoskeletal:  Positive for arthralgias. Negative for joint swelling and myalgias.  ?Skin: Negative.  Negative for color change.  ?Neurological: Negative.  Negative for dizziness, weakness, light-headedness, numbness and headaches.  ?Hematological:  Negative for adenopathy. Does not bruise/bleed easily.  ?Psychiatric/Behavioral: Negative.    ? ?Objective:  ?BP 116/64 (BP Location: Right Arm, Patient Position: Sitting, Cuff Size: Large)   Pulse 71   Temp 98.3 ?F (36.8 ?C) (Oral)   Ht '5\' 10"'$  (1.778 m)   Wt 261 lb (118.4 kg)   SpO2 95%   BMI 37.45 kg/m?  ? ?BP Readings from Last 3 Encounters:  ?09/22/21  116/64  ?09/02/21 100/65  ?08/12/21 119/65  ? ? ?Wt Readings from Last 3 Encounters:  ?09/22/21 261 lb (118.4 kg)  ?08/12/21 263 lb (119.3 kg)  ?03/20/21 266 lb (120.7 kg)  ? ? ?Physical Exam ?Vitals reviewed.  ?Constitutional:   ?   General: He is not in acute distress. ?   Appearance: He is not toxic-appearing or diaphoretic.  ?HENT:  ?   Nose: Nose normal.  ?   Mouth/Throat:  ?   Mouth: Mucous membranes are moist.  ?Eyes:  ?   General: No scleral icterus. ?   Conjunctiva/sclera: Conjunctivae normal.  ?Cardiovascular:  ?   Rate and Rhythm: Normal rate and regular rhythm.  ?    Heart sounds: No murmur heard. ?Pulmonary:  ?   Effort: Pulmonary effort is normal.  ?   Breath sounds: No stridor. No wheezing, rhonchi or rales.  ?Abdominal:  ?   General: Abdomen is protuberant. Bowel sounds are normal. There is no distension.  ?   Palpations: Abdomen is soft. There is no hepatomegaly, splenomegaly or mass.  ?   Tenderness: There is no abdominal tenderness.  ?Musculoskeletal:     ?   General: Normal range of motion.  ?   Cervical back: Neck supple.  ?   Right lower leg: 1+ Pitting Edema present.  ?   Left lower leg: 1+ Pitting Edema present.  ?Lymphadenopathy:  ?   Cervical: No cervical adenopathy.  ?Skin: ?   General: Skin is warm and dry.  ?   Coloration: Skin is not pale.  ?Neurological:  ?   General: No focal deficit present.  ?   Mental Status: He is alert. Mental status is at baseline.  ? ? ?Lab Results  ?Component Value Date  ? WBC 6.3 03/19/2021  ? HGB 14.5 03/19/2021  ? HCT 44.3 03/19/2021  ? PLT 226.0 03/19/2021  ? GLUCOSE 97 08/26/2021  ? CHOL 107 08/26/2021  ? TRIG 97 08/26/2021  ? HDL 32 (L) 08/26/2021  ? [place] 56 08/26/2021  ? ALT 24 09/05/2019  ? AST 25 09/05/2019  ? NA 142 08/26/2021  ? K 4.5 08/26/2021  ? CL 101 08/26/2021  ? CREATININE 0.93 08/26/2021  ? BUN 18 08/26/2021  ? CO2 28 08/26/2021  ? TSH 0.15 (L) 09/22/2021  ? PSA 0.25 09/22/2021  ? INR 1.03 07/29/2015  ? HGBA1C 6.1 09/22/2021  ? ? ?CT CORONARY MORPH W/CTA COR W/SCORE W/CA W/CM &/OR WO/CM ? ?Addendum Date: 09/02/2021   ?ADDENDUM REPORT: 09/02/2021 13:02 CLINICAL DATA:  45M with DOE EXAM: Cardiac/Coronary CTA TECHNIQUE: The patient was scanned on a [employer]. FINDINGS: A 100 kV prospective scan was triggered in the descending thoracic aorta at 111 HU's. Axial non-contrast 3 mm slices were carried out through the heart. The data set was analyzed on a dedicated work station and scored using the [place]. Gantry rotation speed was 250 msecs and collimation was .6 mm. 0.8 mg of sl NTG was given. The 3D  data set was reconstructed in 5% intervals of the 35-75 % of the R-R cycle. Phases were analyzed on a dedicated work station using MPR, MIP and VRT modes. The patient received 80 cc of contrast. Coronary Arteries:  Normal coronary origin.  Right dominance. RCA is a large dominant artery that gives rise to PDA and PLA. There is calcified plaque in the proximal RCA but step artifact in proximal RCA limits interpretation of this segment Left main is a large artery that gives  rise to LAD and LCX arteries. LAD is a large vessel. Calcified plaque in the proximal LAD causes 0-25% stenosis. Calcified plaque in the mid LAD causes 25-49% stenosis. Calcified plaque in the distal LAD causes 0-25% stenosis. Calcified plaque in D1 causes 0-24% stenosis LCX is a non-dominant artery that gives rise to one large OM1 branch. Calcified plaque in the proximal LCX causes 0-24% stenosis Other findings: Left Ventricle: Normal size Left Atrium: Mild enlargement. PFO Pulmonary Veins: Normal configuration Right Ventricle: Moderate enlargement Right Atrium: Mild enlargement Cardiac valves: Mild AV calcifications, AV calcium score 139 Thoracic aorta: Mild dilatation measuring 76m in ascending aorta Pulmonary Arteries: Normal size Systemic Veins: Normal drainage Pericardium: Normal thickness IMPRESSION: 1. Coronary calcium score of 591. This was 69th percentile for age and sex matched control. 2. Normal coronary origin with right dominance. 3. Nonobstructive CAD in interpretable coronaries, though there is calcified plaque in the proximal RCA but step artifact in proximal RCA limits interpretation of this segment 4. Calcified plaque in the mid LAD causes mild (25-49%) stenosis 5. Calcified plaque in the proximal LAD, distal LAD, D1, and proximal LCX causes minimal (0-24%) stenosis 6.  Mild aortic valve calcifications (AV calcium score 139) 7.  PFO Electronically Signed   By: COswaldo MilianM.D.   On: 09/02/2021 13:02  ? ?Result Date:  09/02/2021 ?EXAM: OVER-READ INTERPRETATION  CT CHEST The following report is an over-read performed by radiologist Dr. DVinnie Langtonof [facility], PTrooperon 09/02/2021. This over-read does not include interpret

## 2021-09-22 NOTE — Patient Instructions (Signed)

## 2021-09-27 NOTE — Assessment & Plan Note (Signed)
His A1c is at 6.1%.  Medical therapy is not indicated. ?

## 2021-09-27 NOTE — Assessment & Plan Note (Signed)
His PSA is reassuringly low. ?

## 2021-09-27 NOTE — Assessment & Plan Note (Signed)
Exam completed Labs reviewed Vaccines are up-to-date Cancer screenings are up-to-date Patient education was given 

## 2021-09-27 NOTE — Assessment & Plan Note (Signed)
His blood pressure is adequately well controlled.  I think his symptoms are related to obesity and poor conditioning. ?

## 2021-09-27 NOTE — Assessment & Plan Note (Signed)
His TSH is suppressed so I have lowered his T4 dosage. ?

## 2021-10-07 ENCOUNTER — Ambulatory Visit (INDEPENDENT_AMBULATORY_CARE_PROVIDER_SITE_OTHER): Payer: Medicare Other

## 2021-10-07 DIAGNOSIS — Z0000 Encounter for general adult medical examination without abnormal findings: Secondary | ICD-10-CM

## 2021-10-07 NOTE — Progress Notes (Signed)
?I connected with [name] today by telephone and verified that I am speaking with the correct person using two identifiers. ?Location patient: home ?Location provider: work ?Persons participating in the virtual visit: patient, provider. ?  ?I discussed the limitations, risks, security and privacy concerns of performing an evaluation and management service by telephone and the availability of in person appointments. I also discussed with the patient that there may be a patient responsible charge related to this service. The patient expressed understanding and verbally consented to this telephonic visit.  ?  ?Interactive audio and video telecommunications were attempted between this provider and patient, however failed, due to patient having technical difficulties OR patient did not have access to video capability.  We continued and completed visit with audio only. ? ?Some vital signs may be absent or patient reported.  ? ?Time Spent with patient on telephone encounter: 30 minutes ? ?Subjective:  ? [name] [name] is a 76 y.o. male who presents for Medicare Annual/Subsequent preventive examination. ? ?Review of Systems    ? ?Cardiac Risk Factors include: advanced age (>44mn, >>19women);family history of premature cardiovascular disease;dyslipidemia;hypertension;male gender;obesity (BMI >30kg/m2);sedentary lifestyle ? ?   ?Objective:  ?  ?Today's Vitals  ? 10/07/21 0847  ?PainSc: 4   ? ?There is no height or weight on file to calculate BMI. ? ? ?  10/07/2021  ?  8:50 AM 10/06/2020  ?  9:43 AM 08/29/2020  ? 10:59 AM 01/20/2016  ?  9:59 AM 09/18/2015  ?  8:44 AM 07/29/2015  ? 12:23 PM 05/14/2015  ? 11:36 AM  ?Advanced Directives  ?Does Patient Have a Medical Advance Directive? No No No No No No No  ?Does patient want to make changes to medical advance directive?  No - Patient declined       ?Would patient like information on creating a medical advance directive? No - Patient declined  No - Patient declined  No - patient  declined information No - patient declined information No - patient declined information  ? ? ?Current Medications (verified) ?Outpatient Encounter Medications as of 10/07/2021  ?Medication Sig  ? aspirin EC 81 MG tablet Take 1 tablet (81 mg total) by mouth 2 (two) times daily.  ? cholecalciferol (VITAMIN D3) 25 MCG (1000 UNIT) tablet Take 2,000 Units by mouth daily.  ? DULoxetine (CYMBALTA) 30 MG capsule TAKE 1 CAPSULE BY MOUTH  DAILY  ? furosemide (LASIX) 40 MG tablet Take 1 tablet (40 mg total) by mouth daily.  ? gabapentin (NEURONTIN) 300 MG capsule Take 300 mg by mouth at bedtime.  ? levothyroxine (SYNTHROID) 125 MCG tablet Take 1 tablet (125 mcg total) by mouth daily.  ? losartan-hydrochlorothiazide (HYZAAR) 100-25 MG tablet Take 0.5 tablets by mouth daily.  ? metoprolol succinate (TOPROL-XL) 25 MG 24 hr tablet TAKE 1 TABLET BY MOUTH  DAILY  ? Multiple Vitamins-Minerals (ICAPS AREDS 2 PO) Take 1 capsule by mouth daily.  ? Omega-3 Fatty Acids (SUPER OMEGA-3) 1000 MG CAPS Take by mouth.  ? omeprazole (PRILOSEC) 40 MG capsule Take 1 capsule (40 mg total) by mouth 2 (two) times daily.  ? polyethylene glycol (MIRALAX / GLYCOLAX) 17 g packet Take 17 g by mouth daily.  ? potassium chloride SA (KLOR-CON) 20 MEQ tablet Take 1 tablet (20 mEq total) by mouth daily.  ? simvastatin (ZOCOR) 20 MG tablet TAKE 1 TABLET BY MOUTH  DAILY AT 6 PM.  ? ?No facility-administered encounter medications on file as of 10/07/2021.  ? ? ?Allergies (  verified) ?Penicillins, Codeine, and Sulfonamide derivatives  ? ?History: ?Past Medical History:  ?Diagnosis Date  ? Arthritis   ? CAD (coronary artery disease)   ? cath 3/10: mLAD 30%, pRCA 30%, EF 55-60% Dr. Harrington Challenger cardiologist  most recent  stress stress done ~ 2 years ago with Dr. Harrington Challenger  ? Complication of anesthesia   ? "he usually gets an ileus after back OR"   ? Depression   ? Dyspnea   ? -PFTs compeltely normal 03/20/08 including DLc0  ? Environmental allergies   ? Dust, Smoke  ? GERD  (gastroesophageal reflux disease)   ? HTN (hypertension)   ? Hypothyroidism   ? Low testosterone   ? Observed sleep apnea   ? can't wear cpap  ? Polycythemia   ? PONV (postoperative nausea and vomiting)   ? Pulmonary nodule 06/13/2012  ? Rosacea   ? Sleep apnea   ? Most recent sleep study 2010; records at [facility] office  ? ?Past Surgical History:  ?Procedure Laterality Date  ? BACK SURGERY  2005; 06/2006; 01/2007; 04/2009  ? CARDIAC CATHETERIZATION  08/2008  ? CATARACT EXTRACTION, BILATERAL  2010  ? CERVICAL FUSION  04/2002  ? ?C3-4  ? COLONOSCOPY  12/2005  ? normal screening study.   ? ESOPHAGOGASTRODUODENOSCOPY  multiple  ? last 07/2012 GERD esophagitis, 48 Fr dilation  ? ESOPHAGOGASTRODUODENOSCOPY N/A 09/19/2015  ? Procedure: ESOPHAGOGASTRODUODENOSCOPY (EGD);  Surgeon: [name], MD;  [facility] ENDOSCOPY;  Service: Endoscopy;  Laterality: N/A;  ? KNEE ARTHROSCOPY WITH MEDIAL MENISECTOMY Right 05/14/2015  ? Procedure: RIGHT KNEE ARTHROSCOPY CHONDROPLASTY, PARTIAL  MEDIAL MENISECTOMY;  Surgeon: [name], MD;  Location: [place];  Service: Orthopedics;  Laterality: Right;  ? left knee replacement    ? left shoudler clean    ? [place] SURGERY  06/2006  ? L3  ? MENISCUS REPAIR  08/2008  ? left  ? NECK SURGERY    ? POSTERIOR FUSION LUMBAR SPINE  2005; 01/2007; 04/2009  ? L5; L3-4; L2-3  ? PROSTATE SURGERY  2000  ? SAVORY DILATION N/A 09/19/2015  ? Procedure: SAVORY DILATION;  Surgeon: [name], MD;  Location: [facility] ENDOSCOPY;  Service: Endoscopy;  Laterality: N/A;  ? SHOULDER OPEN ROTATOR CUFF REPAIR Left 1999  ? left  ? TONSILLECTOMY  09/21/11  ? TOTAL KNEE ARTHROPLASTY Left 01/2011  ? left  ? TOTAL KNEE ARTHROPLASTY Right 08/08/2015  ? Procedure: RIGHT TOTAL KNEE ARTHROPLASTY;  Surgeon: [name], MD;  Location: [place];  Service: Orthopedics;  Laterality: Right;  ? TRANSURETHRAL RESECTION OF PROSTATE  2006  ? followed by "surgery to get rid of clots"  ? UVULOPALATOPHARYNGOPLASTY,  TONSILLECTOMY AND SEPTOPLASTY  09/21/11  ? Deviated Septum  ? ?Family History  ?Problem Relation Age of Onset  ? Breast cancer Mother   ?     dx. mid-70s  ? Colon polyps Mother   ?     "59+ colon polyps per yr"  ? Diabetes Mother   ? Breast cancer Sister 30  ? Other Sister   ?     + MUTYH mutation  ? Diabetes Sister   ?     borderline  ? Heart Problems Sister   ? Breast cancer Sister   ?     dx. 48s  ? Heart attack Father   ? Alzheimer's disease Father   ? Lung cancer Paternal Uncle   ?     x4  ? Throat cancer Maternal Uncle   ?  mother's maternal half-brother; dx 29s; +EtOH  ? Liver cancer Maternal Uncle   ?     mother's maternal half-brother; either liver cancer or cirrhosis  ? Cirrhosis Maternal Uncle   ?     +EtOH  ? Stroke Paternal Grandmother   ? Heart attack Paternal Grandfather   ? Other Daughter   ?     negative genetic testing in 2017  ? Parkinson's disease Other   ? Bipolar disorder Other   ? Lung cancer Paternal Uncle   ?     (x3)  ? Lung cancer Paternal Uncle   ? Alzheimer's disease Cousin   ?     paternal 1st cousin, d. 56  ? Heart attack Cousin   ? Anesthesia problems Neg Hx   ? ?Social History  ? ?Socioeconomic History  ? Marital status: Married  ?  Spouse name: Not on file  ? Number of children: 3  ? Years of education: Not on file  ? Highest education level: Not on file  ?Occupational History  ? Occupation: retired  ?  Employer: [employer]  ?Tobacco Use  ? Smoking status: Former  ?  Packs/day: 1.00  ?  Years: 16.00  ?  Pack years: 16.00  ?  Types: Cigarettes  ?  Quit date: 07/17/1974  ?  Years since quitting: 15.2  ? Smokeless tobacco: Former  ?Vaping Use  ? Vaping Use: Never used  ?Substance and Sexual Activity  ? Alcohol use: No  ? Drug use: No  ? Sexual activity: Not on file  ?Other Topics Concern  ? Not on file  ?Social History Narrative  ? Area, 3 children.  ? Retired from the city of [place]  ? 5 caffeinated beverages daily  ? ?Social Determinants of Health  ? ?Financial Resource  Strain: Low Risk   ? Difficulty of Paying Living Expenses: Not hard at all  ?Food Insecurity: No Food Insecurity  ? Worried About Charity fundraiser in the Last Year: Never true  ? Ran Out of Food in the Last [employer]

## 2021-10-07 NOTE — Patient Instructions (Signed)
[name] [name] , ?Thank you for taking time to come for your Medicare Wellness Visit. I appreciate your ongoing commitment to your health goals. Please review the following plan we discussed and let me know if I can assist you in the future.  ? ?Screening recommendations/referrals: ?Colonoscopy: 01/20/2016; due every 10 years ?Recommended yearly ophthalmology/optometry visit for glaucoma screening and checkup ?Recommended yearly dental visit for hygiene and checkup ? ?Vaccinations: ?Influenza vaccine: 03/19/2021 ?Pneumococcal vaccine: 07/03/2014, 03/08/2019 ?Tdap vaccine: 09/18/2018; due every 10 years ?Shingles vaccine: 02/23/2018, 05/03/2018   ?Covid-19: declined ? ?Advanced directives: No ? ?Conditions/risks identified: Yes ? ?Next appointment: Please schedule your next Medicare Wellness Visit with your Nurse Health Advisor in 1 year by calling [phone]. ? ?Preventive Care 11 Years and Older, Male ?Preventive care refers to lifestyle choices and visits with your health care provider that can promote health and wellness. ?What does preventive care include? ?A yearly physical exam. This is also called an annual well check. ?Dental exams once or twice a year. ?Routine eye exams. Ask your health care provider how often you should have your eyes checked. ?Personal lifestyle choices, including: ?Daily care of your teeth and gums. ?Regular physical activity. ?Eating a healthy diet. ?Avoiding tobacco and drug use. ?Limiting alcohol use. ?Practicing safe sex. ?Taking low doses of aspirin every day. ?Taking vitamin and mineral supplements as recommended by your health care provider. ?What happens during an annual well check? ?The services and screenings done by your health care provider during your annual well check will depend on your age, overall health, lifestyle risk factors, and family history of disease. ?Counseling  ?Your health care provider may ask you questions about your: ?Alcohol use. ?Tobacco use. ?Drug  use. ?Emotional well-being. ?Home and relationship well-being. ?Sexual activity. ?Eating habits. ?History of falls. ?Memory and ability to understand (cognition). ?Work and work Statistician. ?Screening  ?You may have the following tests or measurements: ?Height, weight, and BMI. ?Blood pressure. ?Lipid and cholesterol levels. These may be checked every 5 years, or more frequently if you are over 17 years old. ?Skin check. ?Lung cancer screening. You may have this screening every year starting at age 7 if you have a 30-pack-year history of smoking and currently smoke or have quit within the past 15 years. ?Fecal occult blood test (FOBT) of the stool. You may have this test every year starting at age 68. ?Flexible sigmoidoscopy or colonoscopy. You may have a sigmoidoscopy every 5 years or a colonoscopy every 10 years starting at age 12. ?Prostate cancer screening. Recommendations will vary depending on your family history and other risks. ?Hepatitis C blood test. ?Hepatitis B blood test. ?Sexually transmitted disease (STD) testing. ?Diabetes screening. This is done by checking your blood sugar (glucose) after you have not eaten for a while (fasting). You may have this done every 1-3 years. ?Abdominal aortic aneurysm (AAA) screening. You may need this if you are a current or former smoker. ?Osteoporosis. You may be screened starting at age 19 if you are at high risk. ?Talk with your health care provider about your test results, treatment options, and if necessary, the need for more tests. ?Vaccines  ?Your health care provider may recommend certain vaccines, such as: ?Influenza vaccine. This is recommended every year. ?Tetanus, diphtheria, and acellular pertussis (Tdap, Td) vaccine. You may need a Td booster every 10 years. ?Zoster vaccine. You may need this after age 85. ?Pneumococcal 13-valent conjugate (PCV13) vaccine. One dose is recommended after age 18. ?Pneumococcal polysaccharide (PPSV23) vaccine. One dose  is  recommended after age 82. ?Talk to your health care provider about which screenings and vaccines you need and how often you need them. ?This information is not intended to replace advice given to you by your health care provider. Make sure you discuss any questions you have with your health care provider. ?Document Released: 07/11/2015 Document Revised: 03/03/2016 Document Reviewed: 04/15/2015 ?Elsevier Interactive Patient Education ? 2017 [place]. ? ?Fall Prevention in the Home ?Falls can cause injuries. They can happen to people of all ages. There are many things you can do to make your home safe and to help prevent falls. ?What can I do on the outside of my home? ?Regularly fix the edges of walkways and driveways and fix any cracks. ?Remove anything that might make you trip as you walk through a door, such as a raised step or threshold. ?Trim any bushes or trees on the path to your home. ?Use bright outdoor lighting. ?Clear any walking paths of anything that might make someone trip, such as rocks or tools. ?Regularly check to see if handrails are loose or broken. Make sure that both sides of any steps have handrails. ?Any raised decks and porches should have guardrails on the edges. ?Have any leaves, snow, or ice cleared regularly. ?Use sand or salt on walking paths during winter. ?Clean up any spills in your garage right away. This includes oil or grease spills. ?What can I do in the bathroom? ?Use night lights. ?Install grab bars by the toilet and in the tub and shower. Do not use towel bars as grab bars. ?Use non-skid mats or decals in the tub or shower. ?If you need to sit down in the shower, use a plastic, non-slip stool. ?Keep the floor dry. Clean up any water that spills on the floor as soon as it happens. ?Remove soap buildup in the tub or shower regularly. ?Attach bath mats securely with double-sided non-slip rug tape. ?Do not have throw rugs and other things on the floor that can make you  trip. ?What can I do in the bedroom? ?Use night lights. ?Make sure that you have a light by your bed that is easy to reach. ?Do not use any sheets or blankets that are too big for your bed. They should not hang down onto the floor. ?Have a firm chair that has side arms. You can use this for support while you get dressed. ?Do not have throw rugs and other things on the floor that can make you trip. ?What can I do in the kitchen? ?Clean up any spills right away. ?Avoid walking on wet floors. ?Keep items that you use a lot in easy-to-reach places. ?If you need to reach something above you, use a strong step stool that has a grab bar. ?Keep electrical cords out of the way. ?Do not use floor polish or wax that makes floors slippery. If you must use wax, use non-skid floor wax. ?Do not have throw rugs and other things on the floor that can make you trip. ?What can I do with my stairs? ?Do not leave any items on the stairs. ?Make sure that there are handrails on both sides of the stairs and use them. Fix handrails that are broken or loose. Make sure that handrails are as long as the stairways. ?Check any carpeting to make sure that it is firmly attached to the stairs. Fix any carpet that is loose or worn. ?Avoid having throw rugs at the top or bottom of the stairs.  If you do have throw rugs, attach them to the floor with carpet tape. ?Make sure that you have a light switch at the top of the stairs and the bottom of the stairs. If you do not have them, ask someone to add them for you. ?What else can I do to help prevent falls? ?Wear shoes that: ?Do not have high heels. ?Have rubber bottoms. ?Are comfortable and fit you well. ?Are closed at the toe. Do not wear sandals. ?If you use a stepladder: ?Make sure that it is fully opened. Do not climb a closed stepladder. ?Make sure that both sides of the stepladder are locked into place. ?Ask someone to hold it for you, if possible. ?Clearly mark and make sure that you can  see: ?Any grab bars or handrails. ?First and last steps. ?Where the edge of each step is. ?Use tools that help you move around (mobility aids) if they are needed. These include: ?Canes. ?Walkers. ?Scooters. ?Crutches. ?Turn on the

## 2021-10-18 ENCOUNTER — Other Ambulatory Visit: Payer: Self-pay | Admitting: Internal Medicine

## 2021-10-18 DIAGNOSIS — E785 Hyperlipidemia, unspecified: Secondary | ICD-10-CM

## 2021-10-18 DIAGNOSIS — K2210 Ulcer of esophagus without bleeding: Secondary | ICD-10-CM

## 2021-10-18 DIAGNOSIS — I77810 Thoracic aortic ectasia: Secondary | ICD-10-CM

## 2021-10-20 ENCOUNTER — Ambulatory Visit (INDEPENDENT_AMBULATORY_CARE_PROVIDER_SITE_OTHER): Payer: Medicare Other

## 2021-10-20 DIAGNOSIS — I10 Essential (primary) hypertension: Secondary | ICD-10-CM

## 2021-10-20 DIAGNOSIS — E785 Hyperlipidemia, unspecified: Secondary | ICD-10-CM

## 2021-10-20 NOTE — Progress Notes (Signed)
? ?Chronic Care Management ?Pharmacy Note ? ?10/20/2021 ?Name:  [name] [name] MRN:  [id number] DOB:  07/05/1945 ? ?Summary: ?-Spoke with patient's wife [name] [name] as she helps manage his medications ?-Notes that right foot has been swelling, would like to discontinue gabapentin as patient feels it is not helpful with pain control and is contributing to edema in legs / feet - worse in right foot at  this time  ?-endorses medication compliance - wife puts medications in weekly pill box  ?-Has not been checking BP at home, but has been controlled in office ? ?Recommendations/Changes made from today's visit: ?-Advised that if patient wishes to discontinue gabapentin, would recommend taking 361m QOD x 1 week then stop - to monitor pain control / edema to see if improving after discontinuing  ?-Encouraged for patient to monitor blood pressure at home a few times a week, to reach out should BP average >130/80 ? ? ?Subjective: ?RROHIN KREJCIis an 76y.o. year old male who is a primary patient of [name] [name].  The CCM team was consulted for assistance with disease management and care coordination needs.   ? ?Engaged with patient by telephone for follow up visit in response to provider referral for pharmacy case management and/or care coordination services.  ? ?Consent to Services:  ?The patient was given information about Chronic Care Management services, agreed to services, and gave verbal consent prior to initiation of services.  Please see initial visit note for detailed documentation.  ? ?Patient Care Team: ?[name] [name] as PCP - General (Internal Medicine) ?[name] [name] as PCP - Cardiology (Cardiology) ?[name] [name] (Hematology and Oncology) ?[name] [name] (Urology) ?[name] [name] (Neurosurgery) ?[name] [name] as Consulting Physician (Ophthalmology) ?[name] [name] (Podiatry) ?[name] [name] as Consulting Physician (Gastroenterology) ?[name] ([facility] ? ? ?Patient lives at home in wife. He uses walker to get around. He spends most of his time sedentary. ? ?Recent office visits: ?09/22/2021 - Dr. JRonnald [name]- lowered levothyroxine dose - f/u in 6 months  ? ?Recent consult visits: ?08/12/2021 - Dr. RHarrington [name]- Cardiology - cardiac CT scan ordered  ? ?Hospital visits: ?None in previous 6 months ? ?Objective: ? ?Lab Results  ?Component Value Date  ? CREATININE 0.93 08/26/2021  ? BUN 18 08/26/2021  ? GFR 86.41 03/19/2021  ? GFRNONAA 87 06/10/2020  ? GFRAA 100 06/10/2020  ? NA 142 08/26/2021  ? K 4.5 08/26/2021  ? CALCIUM 9.8 08/26/2021  ? CO2 28 08/26/2021  ? GLUCOSE 97 08/26/2021  ? ? ?Lab Results  ?Component Value Date/Time  ? HGBA1C 6.1 09/22/2021 10:19 AM  ? HGBA1C 6.3 03/19/2021 10:05 AM  ? GFR 86.41 03/19/2021 10:05 AM  ? GFR 88.35 09/22/2020 11:51 AM  ?  ?Last diabetic Eye exam: No results found for: HMDIABEYEEXA  ?Last diabetic Foot exam: No results found for: HMDIABFOOTEX  ? ?Lab Results  ?Component Value Date  ? CHOL 107 08/26/2021  ? HDL 32 (L) 08/26/2021  ? LS[place]56 08/26/2021  ? TRIG 97 08/26/2021  ? CHOLHDL 3.3 08/26/2021  ? ? ? ?  Latest Ref Rng & Units 09/05/2019  ? 10:51 AM 09/18/2018  ?  9:56 AM 01/27/2016  ?  2:40 PM  ?Hepatic Function  ?Total Protein 6.0 - 8.3 g/dL 8.1   8.1   7.8    ?Albumin 3.5 - 5.2 g/dL 4.0   4.0     ?AST 0 -  37 U/L 25   18     ?ALT 0 - 53 U/L 24   17     ?Alk Phosphatase 39 - 117 U/L 74   77     ?Total Bilirubin 0.2 - 1.2 mg/dL 0.7   0.6     ?Bilirubin, Direct 0.0 - 0.3 mg/dL 0.1      ? ? ?Lab Results  ?Component Value Date/Time  ? TSH 0.15 (L) 09/22/2021 10:19 AM  ? TSH 0.37 03/19/2021 10:05 AM  ? ? ? ?  Latest Ref Rng & Units 03/19/2021  ? 10:05 AM 05/12/2020  ? 11:16 AM 09/05/2019  ? 10:51 AM  ?CBC  ?WBC 4.0 - 10.5 K/uL 6.3   5.8   5.1    ?Hemoglobin 13.0 - 17.0 g/dL 14.5   14.7   14.5    ?Hematocrit 39.0 - 52.0 % 44.3   43.4   44.1    ?Platelets 150.0 - 400.0 K/uL 226.0   211   208.0    ? ? ?Lab Results  ?Component Value  Date/Time  ? VD25OH 43.8 08/26/2021 08:26 AM  ? VD25OH 32.0 01/27/2016 02:40 PM  ? ? ?Clinical ASCVD: Yes  ?The ASCVD Risk score (Arnett DK, et al., 2019) failed to calculate for the following reasons: ?  The valid total cholesterol range is 130 to 320 mg/dL   ? ? ?  10/07/2021  ?  8:59 AM 10/06/2020  ?  9:42 AM 09/22/2020  ? 12:04 PM  ?Depression screen PHQ 2/9  ?Decreased Interest 1 1 0  ?Down, Depressed, Hopeless 1 1 0  ?PHQ - 2 Score 2 2 0  ?Altered sleeping   2  ?Tired, decreased energy   2  ?Change in appetite   1  ?Feeling bad or failure about yourself    0  ?Trouble concentrating   0  ?Moving slowly or fidgety/restless   0  ?Suicidal thoughts   0  ?PHQ-9 Score   5  ?Difficult doing work/chores   Somewhat difficult  ?  ? ?Social History  ? ?Tobacco Use  ?Smoking Status Former  ? Packs/day: 1.00  ? Years: 16.00  ? Pack years: 16.00  ? Types: Cigarettes  ? Quit date: 07/17/1974  ? Years since quitting: 68.2  ?Smokeless Tobacco Former  ? ?BP Readings from Last 3 Encounters:  ?09/22/21 116/64  ?09/02/21 100/65  ?08/12/21 119/65  ? ?Pulse Readings from Last 3 Encounters:  ?09/22/21 71  ?09/02/21 (!) 58  ?08/12/21 67  ? ?Wt Readings from Last 3 Encounters:  ?09/22/21 261 lb (118.4 kg)  ?08/12/21 263 lb (119.3 kg)  ?03/20/21 266 lb (120.7 kg)  ? ?BMI Readings from Last 3 Encounters:  ?09/22/21 37.45 kg/m?  ?08/12/21 37.74 kg/m?  ?03/20/21 38.17 kg/m?  ? ? ?Assessment/Interventions: Review of patient past medical history, allergies, medications, health status, including review of consultants reports, laboratory and other test data, was performed as part of comprehensive evaluation and provision of chronic care management services.  ? ?SDOH:  (Social Determinants of Health) assessments and interventions performed: Yes ? ?SDOH Screenings  ? ?Alcohol Screen: Low Risk   ? Last Alcohol Screening Score (AUDIT): 0  ?Depression (PHQ2-9): Low Risk   ? PHQ-2 Score: 2  ?Financial Resource Strain: Low Risk   ? Difficulty of Paying  Living Expenses: Not hard at all  ?Food Insecurity: No Food Insecurity  ? Worried About Charity fundraiser in the Last Year: Never true  ? Ran Out of Food in  the Last Year: Never true  ?Housing: Low Risk   ? Last Housing Risk Score: 0  ?Physical Activity: Inactive  ? Days of Exercise per Week: 0 days  ? Minutes of Exercise per Session: 0 min  ?Social Connections: Socially Integrated  ? Frequency of Communication with Friends and Family: More than three times a week  ? Frequency of Social Gatherings with Friends and Family: More than three times a week  ? Attends Religious Services: More than 4 times per year  ? Active Member of Clubs or Organizations: Yes  ? Attends Archivist Meetings: More than 4 times per year  ? Marital Status: Married  ?Stress: No Stress Concern Present  ? Feeling of Stress : Not at all  ?Tobacco Use: Medium Risk  ? Smoking Tobacco Use: Former  ? Smokeless Tobacco Use: Former  ? Passive Exposure: Not on file  ?Transportation Needs: No Transportation Needs  ? Lack of Transportation (Medical): No  ? Lack of Transportation (Non-Medical): No  ? ? ?CCM Care Plan ? ?Allergies  ?Allergen Reactions  ? Penicillins Hives and Other (See Comments)  ?  "I break out in welts" ?Has patient had a PCN reaction causing immediate rash, facial/tongue/throat swelling, SOB or lightheadedness with hypotension: No ?Has patient had a PCN reaction causing severe rash involving mucus membranes or skin necrosis: No ?Has patient had a PCN reaction that required hospitalization No ?Has patient had a PCN reaction occurring within the last 10 years: No ?If all of the above answers are "NO", then may proceed with Cephalosporin use.  ? Codeine Nausea And Vomiting  ? Sulfonamide Derivatives Nausea And Vomiting  ? ? ?Medications Reviewed Today   ? ? Reviewed by Tomasa Blase, [facility] (Pharmacist) on 10/20/21 at 1104  Med List Status: <None>  ? ?Medication Order Taking? Sig Documenting Provider Last Dose Status Informant   ?aspirin EC 81 MG tablet [id number] Yes Take 1 tablet (81 mg total) by mouth 2 (two) times daily. Fay Records, [name] Taking Active   ?cholecalciferol (VITAMIN D3) 25 MCG (1000 UNIT) tablet [id number] Yes Take 2,000 Units

## 2021-10-20 NOTE — Patient Instructions (Signed)
Visit Information ? ?Following are the goals we discussed today:  ? ?Manage My Medicine  ? ?Timeframe:  Long-Range Goal ?Priority:  Medium ?Start Date:     10/29/20                        ?Expected End Date: 10/21/2022                     ? ?Follow Up Date 03/2022 ?  ?- call for medicine refill 2 or 3 days before it runs out ?- call if I am sick and can't take my medicine ?- keep a list of all the medicines I take; vitamins and herbals too ?- use a pillbox to sort medicine  ?-Reduce salt in diet (<2000 mg ideally) ?  ?Why is this important?   ?These steps will help you keep on track with your medicines. ? ?Plan: Telephone follow up appointment with care management team member scheduled for:  6 months ?The patient has been provided with contact information for the care management team and has been advised to call with any health related questions or concerns.  ? ?Tomasa Blase, PharmD ?Clinical Pharmacist, [place]  ? ?Please call the care guide team at [phone] if you need to cancel or reschedule your appointment.  ? ?Patient verbalizes understanding of instructions and care plan provided today and agrees to view in [place]. Active MyChart status confirmed with patient.   ? ?

## 2021-10-25 DIAGNOSIS — F32A Depression, unspecified: Secondary | ICD-10-CM

## 2021-10-25 DIAGNOSIS — M1990 Unspecified osteoarthritis, unspecified site: Secondary | ICD-10-CM | POA: Diagnosis not present

## 2021-10-25 DIAGNOSIS — I10 Essential (primary) hypertension: Secondary | ICD-10-CM

## 2021-10-25 DIAGNOSIS — E785 Hyperlipidemia, unspecified: Secondary | ICD-10-CM | POA: Diagnosis not present

## 2021-11-20 ENCOUNTER — Encounter (HOSPITAL_COMMUNITY): Payer: Self-pay | Admitting: Emergency Medicine

## 2021-11-20 ENCOUNTER — Ambulatory Visit (HOSPITAL_COMMUNITY)
Admission: EM | Admit: 2021-11-20 | Discharge: 2021-11-20 | Disposition: A | Discharge status: Home / Self Care | Payer: Medicare Other | Attending: Family Medicine | Admitting: Family Medicine

## 2021-11-20 DIAGNOSIS — G500 Trigeminal neuralgia: Secondary | ICD-10-CM

## 2021-11-20 MED ORDER — CARBAMAZEPINE 200 MG PO TABS: 200.0000 mg | ORAL_TABLET | Freq: Two times a day (BID) | ORAL | 0 refills | Status: DC

## 2021-11-20 MED ORDER — GABAPENTIN 100 MG PO CAPS: 100.0000 mg | ORAL_CAPSULE | Freq: Two times a day (BID) | ORAL | 0 refills | Status: DC

## 2021-11-20 NOTE — Discharge Instructions (Addendum)
I think you are experiencing something called trigeminal neuralgia.  Take carbamazepine 200 mg --1 tablet 2 times daily. This medication can cause sleepiness or dizziness.  Please call your primary care office and make a follow-up appointment with them.  There are other treatments for this condition, and they may want to change your medication regimen.  Please feel free to proceed to the emergency room for possible imaging if you start having new or with  weakness in 1 arm and leg or trouble speaking

## 2021-11-20 NOTE — ED Provider Notes (Addendum)
[place]    CSN: [id number] Arrival date & time: 11/20/21  1151      History   Chief Complaint Chief Complaint  Patient presents with   Facial Pain    HPI [name] [name] is a 76 y.o. male.   HPI Here for lancinating pain in the right side of his face if he touches his forehead or his cheek or chews he will have shooting pains in the right side of his face mainly on his cheek.  This began after he strained to have a bowel movement on May 21.  No increasing cough or recent fever.  No vomiting.  He does usually walk with a walker, but no new neurologic deficits noted  Past Medical History:  Diagnosis Date   Arthritis    CAD (coronary artery disease)    cath 3/10: mLAD 30%, pRCA 30%, EF 55-60% Dr. Harrington Challenger cardiologist  most recent  stress stress done ~ 2 years ago with Dr. Harrington Challenger   Complication of anesthesia    "he usually gets an ileus after back OR"    Depression    Dyspnea    -PFTs compeltely normal 03/20/08 including DLc0   Environmental allergies    Dust, Smoke   GERD (gastroesophageal reflux disease)    HTN (hypertension)    Hypothyroidism    Low testosterone    Observed sleep apnea    can't wear cpap   Polycythemia    PONV (postoperative nausea and vomiting)    Pulmonary nodule 06/13/2012   Rosacea    Sleep apnea    Most recent sleep study 2010; records at [facility]    Patient Active Problem List   Diagnosis Date Noted   Encounter for general adult medical examination with abnormal findings 09/22/2021   Flu vaccine need 03/19/2021   Atherosclerosis of aorta ([place]) 09/22/2020   Onychomycosis of toenail 09/05/2019   Benign prostatic hyperplasia without lower urinary tract symptoms 09/18/2018   Prediabetes 09/18/2018   Claudication of both lower extremities ([place]) 09/18/2018   Hyperlipidemia LDL goal <70 09/18/2018   Pure hyperglyceridemia 09/18/2018   Routine general medical examination at a health care facility 09/18/2018   GERD  (gastroesophageal reflux disease)    CAD (coronary artery disease)    OSA (obstructive sleep apnea) 09/18/2015   Mild concentric left ventricular hypertrophy (LVH) 09/18/2015   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    ESOPHAGOGASTRODUODENOSCOPY  multiple   last 07/2012 GERD esophagitis, 48 Fr dilation   ESOPHAGOGASTRODUODENOSCOPY N/A 09/19/2015   Procedure: ESOPHAGOGASTRODUODENOSCOPY (EGD);  Surgeon: [name], MD;  Location: [facility] ENDOSCOPY;  Service: Endoscopy;  Laterality: N/A;   KNEE ARTHROSCOPY WITH MEDIAL MENISECTOMY Right 05/14/2015   Procedure: RIGHT KNEE ARTHROSCOPY CHONDROPLASTY, PARTIAL  MEDIAL MENISECTOMY;  Surgeon: [name], MD;  Location: [place];  Service: Orthopedics;  Laterality: Right;   left knee replacement     left shoudler clean     LUMBAR DISC SURGERY  06/2006   L3  MENISCUS REPAIR  08/2008   left   NECK SURGERY     POSTERIOR FUSION LUMBAR SPINE  2005; 01/2007; 04/2009   L5; L3-4; L2-3   PROSTATE SURGERY  2000   SAVORY DILATION N/A 09/19/2015   Procedure: SAVORY DILATION;  Surgeon: [name], MD;  Location[facility] ENDOSCOPY;  Service: Endoscopy;  Laterality: N/A;   SHOULDER OPEN ROTATOR CUFF REPAIR Left 1999   left   TONSILLECTOMY  09/21/11   TOTAL KNEE ARTHROPLASTY Left 01/2011   left   TOTAL KNEE ARTHROPLASTY Right 08/08/2015   Procedure: RIGHT TOTAL KNEE ARTHROPLASTY;  Surgeon: [name], MD;  Location: [place];  Service: Orthopedics;   Laterality: Right;   TRANSURETHRAL RESECTION OF PROSTATE  2006   followed by "surgery to get rid of clots"   UVULOPALATOPHARYNGOPLASTY, TONSILLECTOMY AND SEPTOPLASTY  09/21/11   Deviated Septum       Home Medications    Prior to Admission medications   Medication Sig Start Date End Date Taking? Authorizing Provider  carbamazepine (TEGRETOL) 200 MG tablet Take 1 tablet (200 mg total) by mouth in the morning and at bedtime. 11/20/21  Yes Barrett Henle, MD  aspirin EC 81 MG tablet Take 1 tablet (81 mg total) by mouth 2 (two) times daily. 08/12/21   Fay Records, MD  cholecalciferol (VITAMIN D3) 25 MCG (1000 UNIT) tablet Take 2,000 Units by mouth daily.    [provider]  DULoxetine (CYMBALTA) 30 MG capsule TAKE 1 CAPSULE BY MOUTH DAILY 10/19/21   [name], MD  furosemide (LASIX) 40 MG tablet TAKE 1 TABLET BY MOUTH DAILY 10/20/21   Fay Records, MD  levothyroxine (SYNTHROID) 125 MCG tablet Take 1 tablet (125 mcg total) by mouth daily. 09/22/21   [name], MD  losartan-hydrochlorothiazide [facility]) 100-25 MG tablet TAKE ONE-HALF TABLET BY  MOUTH DAILY 10/20/21   Fay Records, MD  metoprolol succinate (TOPROL-XL) 25 MG 24 hr tablet TAKE 1 TABLET BY MOUTH  DAILY 03/30/21   Fay Records, MD  Multiple Vitamins-Minerals (ICAPS AREDS 2 PO) Take 1 capsule by mouth daily.    [provider]  Omega-3 Fatty Acids (SUPER OMEGA-3) 1000 MG CAPS Take by mouth.    [provider]  omeprazole (PRILOSEC) 40 MG capsule TAKE 1 CAPSULE BY MOUTH TWICE  DAILY 10/19/21   [name], MD  polyethylene glycol (MIRALAX / GLYCOLAX) 17 g packet Take 17 g by mouth daily.    [provider]  potassium chloride SA (KLOR-CON M) 20 MEQ tablet TAKE 1 TABLET BY MOUTH DAILY 10/20/21   Fay Records, MD  simvastatin (ZOCOR) 20 MG tablet TAKE 1 TABLET BY MOUTH DAILY AT  6 PM. 10/19/21   [name], MD    Family History Family History  Problem Relation Age of Onset    Breast cancer Mother        dx. mid-70s   Colon polyps Mother        "80+ colon polyps per yr"   Diabetes Mother    Breast cancer Sister 98   Other Sister        + MUTYH mutation   Diabetes Sister        borderline   Heart Problems Sister    Breast cancer Sister        dx. 68s   Heart attack Father    Alzheimer's disease Father    Lung cancer Paternal Uncle  x4   Throat cancer Maternal Uncle        mother's maternal half-brother; dx 73s; +EtOH   Liver cancer Maternal Uncle        mother's maternal half-brother; either liver cancer or cirrhosis   Cirrhosis Maternal Uncle        +EtOH   Stroke Paternal Grandmother    Heart attack Paternal Grandfather    Other Daughter        negative genetic testing in 2017   Parkinson's disease Other    Bipolar disorder Other    Lung cancer Paternal Uncle        (x3)   Lung cancer Paternal Uncle    Alzheimer's disease Cousin        paternal 1st cousin, d. 86   Heart attack Cousin    Anesthesia problems Neg Hx     Social History Social History   Tobacco Use   Smoking status: Former    Packs/day: 1.00    Years: 16.00    Pack years: 16.00    Types: Cigarettes    Quit date: 07/17/1974    Years since quitting: 47.3   Smokeless tobacco: Former  Scientific laboratory technician Use: Never used  Substance Use Topics   Alcohol use: No   Drug use: No     Allergies   Penicillins, Codeine, and Sulfonamide derivatives   Review of Systems Review of Systems   Physical Exam Triage Vital Signs ED Triage Vitals  Enc Vitals Group     BP 11/20/21 1321 (!) 106/58     Pulse Rate 11/20/21 1321 63     Resp 11/20/21 1321 20     Temp 11/20/21 1321 98.1 F (36.7 C)     Temp Source 11/20/21 1321 Oral     SpO2 11/20/21 1321 97 %     Weight --      Height --      Head Circumference --      Peak Flow --      Pain Score 11/20/21 1319 0     Pain Loc --      Pain Edu? --      Excl. in [place]? --    No data found.  Updated Vital Signs BP (!)  106/58 (BP Location: Right Arm)   Pulse 63   Temp 98.1 F (36.7 C) (Oral)   Resp 20   SpO2 97%   Visual Acuity Right Eye Distance:   Left Eye Distance:   Bilateral Distance:    Right Eye Near:   Left Eye Near:    Bilateral Near:     Physical Exam Vitals reviewed.  Constitutional:      General: He is not in acute distress.    Appearance: He is not ill-appearing, toxic-appearing or diaphoretic.  HENT:     Right Ear: Tympanic membrane and ear canal normal.     Left Ear: Tympanic membrane and ear canal normal.     Nose: Nose normal.     Mouth/Throat:     Mouth: Mucous membranes are moist.     Pharynx: No oropharyngeal exudate or posterior oropharyngeal erythema.  Eyes:     Extraocular Movements: Extraocular movements intact.     Conjunctiva/sclera: Conjunctivae normal.     Pupils: Pupils are equal, round, and reactive to light.  Cardiovascular:     Rate and Rhythm: Normal rate and regular rhythm.     Heart sounds: No murmur heard. Pulmonary:     Effort: Pulmonary effort  is normal.     Breath sounds: Normal breath sounds.  Musculoskeletal:     Cervical back: Neck supple.  Neurological:     General: No focal deficit present.     Mental Status: He is alert and oriented to person, place, and time.     Cranial Nerves: No cranial nerve deficit.     Sensory: No sensory deficit.     Motor: No weakness.     Deep Tendon Reflexes: Reflexes normal.  Psychiatric:        Behavior: Behavior normal.     UC Treatments / Results  Labs (all labs ordered are listed, but only abnormal results are displayed) Labs Reviewed - No data to display  EKG   Radiology No results found.  Procedures Procedures (including critical care time)  Medications Ordered in UC Medications - No data to display  Initial Impression / Assessment and Plan / UC Course  I have reviewed the triage vital signs and the nursing notes.  Pertinent labs & imaging results that were available during my care  of the patient were reviewed by me and considered in my medical decision making (see chart for details).     Discussed with him that I think this is consistent with trigeminal neuralgia, and possible treatment.  He then stated he was worried he had "caused damage" with the straining when he had his bowel movement that day on May 21.  I discussed with him that I do not find any neurologic deficits that would indicate he had had a stroke, however he is welcome to go to the emergency room if he either starts feeling worse or he does not feel that he is improving with the treatment.  I do want him to follow-up with his primary care office also Blood pressure is relatively stable from previous blood pressures done in the last few months   His med list had gabapentin 300 mg, so initially I had sent him in 100 mg for daytime use. Turns out he had quit taking the gabapentin 300 mg due to not helping his neck pain that's chronic.  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  Take carbamazepine 200 mg --1 tablet 2 times daily. This medication can cause sleepiness or dizziness.  Please call your primary care office and make a follow-up appointment with them.  There are other treatments for this condition, and they may want to change your medication regimen.  Please feel free to proceed to the emergency room for possible imaging if you start having new or with  weakness in 1 arm and leg or trouble speaking     ED Prescriptions     Medication Sig Dispense Auth. Provider   gabapentin (NEURONTIN) 100 MG capsule  (Status: Discontinued) Take 1 capsule (100 mg total) by mouth 2 (two) times daily. Morning and afternoon, and continue the 300 mg at bedtime. 30 capsule Barrett Henle, MD   carbamazepine (TEGRETOL) 200 MG tablet Take 1  tablet (200 mg total) by mouth in the morning and at bedtime. 60 tablet Randale Carvalho, Gwenlyn Perking, MD      PDMP not reviewed this encounter.   Barrett Henle, MD 11/20/21 1348    Barrett Henle, MD 11/20/21 [phone]

## 2021-11-20 NOTE — ED Triage Notes (Signed)
Pt reports last Saturday/Sunday was constipated and had to strain really hard to have BM. Since straining he had right sided facial pains with palpation or when water hits certain areas.

## 2021-12-01 ENCOUNTER — Ambulatory Visit: Payer: Medicare Other | Admitting: Internal Medicine

## 2021-12-01 ENCOUNTER — Encounter: Payer: Self-pay | Admitting: Internal Medicine

## 2021-12-01 VITALS — BP 114/68 | HR 103 | Temp 99.1°F | Ht 70.0 in | Wt 261.0 lb

## 2021-12-01 DIAGNOSIS — G500 Trigeminal neuralgia: Secondary | ICD-10-CM

## 2021-12-01 DIAGNOSIS — G44099 Other trigeminal autonomic cephalgias (TAC), not intractable: Secondary | ICD-10-CM

## 2021-12-01 DIAGNOSIS — I10 Essential (primary) hypertension: Secondary | ICD-10-CM

## 2021-12-01 DIAGNOSIS — R7303 Prediabetes: Secondary | ICD-10-CM

## 2021-12-01 MED ORDER — HYDROCODONE-ACETAMINOPHEN 7.5-325 MG PO TABS: 1.0000 | ORAL_TABLET | Freq: Four times a day (QID) | ORAL | 0 refills | Status: DC | PRN

## 2021-12-01 NOTE — Progress Notes (Signed)
Patient ID: [name] [name], male   DOB: 24-Dec-1945, 76 y.o.   MRN: [id number]        Chief Complaint: follow up right facial pain       HPI:  [name] [name] is a 76 y.o. male here with c/o 2 wks onset pain to the right face severe lancinating intermittent, to upper, mid and lower 5th nerve distrubution areas with sensitivity to touch, worse with talking and chewing, without swelling, skin change or rash.  Seen at [facility], was already on gabapentin, later changed to tegretol 200 but with frequent breakthrough pain today.  Pt denies chest pain, increased sob or doe, wheezing, orthopnea, PND, increased LE swelling, palpitations, dizziness or syncope.   Pt denies polydipsia, polyuria, or other new focal neuro s/s.    Pt denies fever, wt loss, night sweats, loss of appetite, or other constitutional symptoms         Wt Readings from Last 3 Encounters:  12/01/21 261 lb (118.4 kg)  09/22/21 261 lb (118.4 kg)  08/12/21 263 lb (119.3 kg)   BP Readings from Last 3 Encounters:  12/01/21 114/68  11/20/21 (!) 106/58  09/22/21 116/64         Past Medical History:  Diagnosis Date   Arthritis    CAD (coronary artery disease)    cath 3/10: mLAD 30%, pRCA 30%, EF 55-60% Dr. Harrington Challenger cardiologist  most recent  stress stress done ~ 2 years ago with Dr. Harrington Challenger   Complication of anesthesia    "he usually gets an ileus after back OR"    Depression    Dyspnea    -PFTs compeltely normal 03/20/08 including DLc0   Environmental allergies    Dust, Smoke   GERD (gastroesophageal reflux disease)    HTN (hypertension)    Hypothyroidism    Low testosterone    Observed sleep apnea    [name]'t wear cpap   Polycythemia    PONV (postoperative nausea and vomiting)    Pulmonary nodule 06/13/2012   Rosacea    Sleep apnea    Most recent sleep study 2010; records at [facility]   Past Surgical History:  Procedure Laterality Date   BACK SURGERY  2005; 06/2006; 01/2007; 04/2009   CARDIAC CATHETERIZATION  08/2008   CATARACT  EXTRACTION, BILATERAL  2010   CERVICAL FUSION  04/2002   ?C3-4   COLONOSCOPY  12/2005   normal screening study.    ESOPHAGOGASTRODUODENOSCOPY  multiple   last 07/2012 GERD esophagitis, 48 Fr dilation   ESOPHAGOGASTRODUODENOSCOPY N/A 09/19/2015   Procedure: ESOPHAGOGASTRODUODENOSCOPY (EGD);  Surgeon: [name], MD;  Location: [facility] ENDOSCOPY;  Service: Endoscopy;  Laterality: N/A;   KNEE ARTHROSCOPY WITH MEDIAL MENISECTOMY Right 05/14/2015   Procedure: RIGHT KNEE ARTHROSCOPY CHONDROPLASTY, PARTIAL  MEDIAL MENISECTOMY;  Surgeon: [name], MD;  Location: [place];  Service: Orthopedics;  Laterality: Right;   left knee replacement     left shoudler clean     LUMBAR DISC SURGERY  06/2006   L3   MENISCUS REPAIR  08/2008   left   NECK SURGERY     POSTERIOR FUSION LUMBAR SPINE  2005; 01/2007; 04/2009   L5; L3-4; L2-3   PROSTATE SURGERY  2000   SAVORY DILATION N/A 09/19/2015   Procedure: SAVORY DILATION;  Surgeon: [name], MD;  Location: [facility] ENDOSCOPY;  Service: Endoscopy;  Laterality: N/A;   SHOULDER OPEN ROTATOR CUFF REPAIR Left 1999   left   TONSILLECTOMY  09/21/11  TOTAL KNEE ARTHROPLASTY Left 01/2011   left   TOTAL KNEE ARTHROPLASTY Right 08/08/2015   Procedure: RIGHT TOTAL KNEE ARTHROPLASTY;  Surgeon: [name], MD;  Location: [place];  Service: Orthopedics;  Laterality: Right;   TRANSURETHRAL RESECTION OF PROSTATE  2006   followed by "surgery to get rid of clots"   UVULOPALATOPHARYNGOPLASTY, TONSILLECTOMY AND SEPTOPLASTY  09/21/11   Deviated Septum    reports that he quit smoking about 47 years ago. His smoking use included cigarettes. He has a 16.00 pack-year smoking history. He has quit using smokeless tobacco. He reports that he does not drink alcohol and does not use drugs. family history includes Alzheimer's disease in his cousin and father; Bipolar disorder in an other family member; Breast cancer in his mother and sister; Breast cancer (age of onset:  42) in his sister; Cirrhosis in his maternal uncle; Colon polyps in his mother; Diabetes in his mother and sister; Heart Problems in his sister; Heart attack in his cousin, father, and paternal grandfather; Liver cancer in his maternal uncle; Lung cancer in his paternal uncle, paternal uncle, and paternal uncle; Other in his daughter and sister; Parkinson's disease in an other family member; Stroke in his paternal grandmother; Throat cancer in his maternal uncle. Allergies  Allergen Reactions   Penicillins Hives and Other (See Comments)    "I break out in welts" Has patient had a PCN reaction causing immediate rash, facial/tongue/throat swelling, SOB or lightheadedness with hypotension: No Has patient had a PCN reaction causing severe rash involving mucus membranes or skin necrosis: No Has patient had a PCN reaction that required hospitalization No Has patient had a PCN reaction occurring within the last 10 years: No If all of the above answers are "NO", then may proceed with Cephalosporin use.   Codeine Nausea And Vomiting   Sulfonamide Derivatives Nausea And Vomiting   Current Outpatient Medications on File Prior to Visit  Medication Sig Dispense Refill   aspirin EC 81 MG tablet Take 1 tablet (81 mg total) by mouth 2 (two) times daily. 30 tablet    carbamazepine (TEGRETOL) 200 MG tablet Take 1 tablet (200 mg total) by mouth in the morning and at bedtime. 60 tablet 0   cholecalciferol (VITAMIN D3) 25 MCG (1000 UNIT) tablet Take 2,000 Units by mouth daily.     DULoxetine (CYMBALTA) 30 MG capsule TAKE 1 CAPSULE BY MOUTH DAILY 90 capsule 0   furosemide (LASIX) 40 MG tablet TAKE 1 TABLET BY MOUTH DAILY 90 tablet 3   levothyroxine (SYNTHROID) 125 MCG tablet Take 1 tablet (125 mcg total) by mouth daily. 90 tablet 1   losartan-hydrochlorothiazide (HYZAAR) 100-25 MG tablet TAKE ONE-HALF TABLET BY  MOUTH DAILY 45 tablet 3   metoprolol succinate (TOPROL-XL) 25 MG 24 hr tablet TAKE 1 TABLET BY MOUTH   DAILY 90 tablet 3   Multiple Vitamins-Minerals (ICAPS AREDS 2 PO) Take 1 capsule by mouth daily.     Omega-3 Fatty Acids (SUPER OMEGA-3) 1000 MG CAPS Take by mouth.     omeprazole (PRILOSEC) 40 MG capsule TAKE 1 CAPSULE BY MOUTH TWICE  DAILY 180 capsule 0   polyethylene glycol (MIRALAX / GLYCOLAX) 17 g packet Take 17 g by mouth daily.     potassium chloride SA (KLOR-CON M) 20 MEQ tablet TAKE 1 TABLET BY MOUTH DAILY 90 tablet 3   simvastatin (ZOCOR) 20 MG tablet TAKE 1 TABLET BY MOUTH DAILY AT  6 PM. 90 tablet 0   No current facility-administered medications on file  prior to visit.        ROS:  All others reviewed and negative.  Objective        PE:  BP 114/68 (BP Location: Right Arm, Patient Position: Sitting, Cuff Size: Large)   Pulse (!) 103   Temp 99.1 F (37.3 C) (Oral)   Ht '5\' 10"'$  (1.778 m)   Wt 261 lb (118.4 kg)   SpO2 97%   BMI 37.45 kg/m                 Constitutional: Pt appears in NAD               HENT: Head: NCAT.                Right Ear: External ear normal.                 Left Ear: External ear normal.                Eyes: . Pupils are equal, round, and reactive to light. Conjunctivae and EOM are normal               Nose: without d/c or deformity               Neck: Neck supple. Gross normal ROM               Cardiovascular: Normal rate and regular rhythm.                 Pulmonary/Chest: Effort normal and breath sounds without rales or wheezing.                Abd:  Soft, NT, ND, + BS, no organomegaly               Neurological: Pt is alert. At baseline orientation, motor grossly intact, cn 2-12 intact               Skin: Skin is warm. No rashes, no other new lesions, LE edema - none               Psychiatric: Pt behavior is normal without agitation   Micro: none  Cardiac tracings I have personally interpreted today:  none  Pertinent Radiological findings (summarize): none   Lab Results  Component Value Date   WBC 6.3 03/19/2021   HGB 14.5 03/19/2021    HCT 44.3 03/19/2021   PLT 226.0 03/19/2021   GLUCOSE 97 08/26/2021   CHOL 107 08/26/2021   TRIG 97 08/26/2021   HDL 32 (L) 08/26/2021   LDLCALC 56 08/26/2021   ALT 24 09/05/2019   AST 25 09/05/2019   NA 142 08/26/2021   K 4.5 08/26/2021   CL 101 08/26/2021   CREATININE 0.93 08/26/2021   BUN 18 08/26/2021   CO2 28 08/26/2021   TSH 0.15 (L) 09/22/2021   PSA 0.25 09/22/2021   INR 1.03 07/29/2015   HGBA1C 6.1 09/22/2021   Assessment/Plan:  [name] [name] is a 76 y.o. White or Caucasian [1] male with  has a past medical history of Arthritis, CAD (coronary artery disease), Complication of anesthesia, Depression, Dyspnea, Environmental allergies, GERD (gastroesophageal reflux disease), HTN (hypertension), Hypothyroidism, Low testosterone, Observed sleep apnea, Polycythemia, PONV (postoperative nausea and vomiting), Pulmonary nodule (06/13/2012), Rosacea, and Sleep apnea.  Trigeminal neuralgia of right side of face With uncontrolled pain, for vicodin prn, CT head, and refer NS at [facility] consider gamma knife tx  Prediabetes Lab Results  Component  Value Date   HGBA1C 6.1 09/22/2021   Stable, pt to continue current medical treatment  - diet, wt control, excercise   Essential hypertension BP Readings from Last 3 Encounters:  12/01/21 114/68  11/20/21 (!) 106/58  09/22/21 116/64   Stable, pt to continue medical treatment hyzaar 100 - 25 qd, and toprol xl 25 qd  Followup: Return if symptoms worsen or fail to improve.  Cathlean Cower, MD 12/01/2021 9:52 PM [facility]

## 2021-12-01 NOTE — Assessment & Plan Note (Signed)
BP Readings from Last 3 Encounters:  12/01/21 114/68  11/20/21 (!) 106/58  09/22/21 116/64   Stable, pt to continue medical treatment hyzaar 100 - 25 qd, and toprol xl 25 qd

## 2021-12-01 NOTE — Assessment & Plan Note (Signed)
With uncontrolled pain, for vicodin prn, CT head, and refer NS at [facility] consider gamma knife tx

## 2021-12-01 NOTE — Assessment & Plan Note (Signed)
Lab Results  Component Value Date   HGBA1C 6.1 09/22/2021   Stable, pt to continue current medical treatment  - diet, wt control, excercise

## 2021-12-01 NOTE — Patient Instructions (Addendum)
You do appear to have the Trigeminal neuralgia  Please take all new medication as prescribed - the hydrocodone, since the tegretol is not working well  We could consider adding back the gabapentin at some point if you like  Please continue all other medications as before, including the Cymbalta  Please have the pharmacy call with any other refills you may need.  Please keep your appointments with your specialists as you may have planned  You will be contacted regarding the referral for: Head CT (which sees the brain and the sinus area), and Neurosurgury at [facility] since that is where they might be able to do a [facility] (Dr Ellene Route would not usually do this)

## 2021-12-04 ENCOUNTER — Other Ambulatory Visit: Payer: Medicare Other

## 2021-12-23 ENCOUNTER — Other Ambulatory Visit: Payer: Self-pay | Admitting: Internal Medicine

## 2021-12-23 DIAGNOSIS — I77810 Thoracic aortic ectasia: Secondary | ICD-10-CM

## 2021-12-23 DIAGNOSIS — E785 Hyperlipidemia, unspecified: Secondary | ICD-10-CM

## 2021-12-23 DIAGNOSIS — K2210 Ulcer of esophagus without bleeding: Secondary | ICD-10-CM

## 2021-12-28 ENCOUNTER — Ambulatory Visit
Admission: RE | Admit: 2021-12-28 | Discharge: 2021-12-28 | Disposition: A | Discharge status: Home / Self Care | Payer: Medicare Other | Source: Ambulatory Visit | Attending: Internal Medicine | Admitting: Internal Medicine

## 2021-12-28 DIAGNOSIS — G44099 Other trigeminal autonomic cephalgias (TAC), not intractable: Secondary | ICD-10-CM

## 2021-12-29 ENCOUNTER — Encounter: Payer: Self-pay | Admitting: Internal Medicine

## 2021-12-29 DIAGNOSIS — Z8673 Personal history of transient ischemic attack (TIA), and cerebral infarction without residual deficits: Secondary | ICD-10-CM | POA: Insufficient documentation

## 2022-01-07 ENCOUNTER — Other Ambulatory Visit: Payer: Self-pay | Admitting: Internal Medicine

## 2022-01-07 DIAGNOSIS — E039 Hypothyroidism, unspecified: Secondary | ICD-10-CM

## 2022-03-10 ENCOUNTER — Other Ambulatory Visit: Payer: Self-pay | Admitting: Internal Medicine

## 2022-03-10 DIAGNOSIS — I77810 Thoracic aortic ectasia: Secondary | ICD-10-CM

## 2022-03-10 DIAGNOSIS — E785 Hyperlipidemia, unspecified: Secondary | ICD-10-CM

## 2022-03-10 DIAGNOSIS — K2210 Ulcer of esophagus without bleeding: Secondary | ICD-10-CM

## 2022-03-17 ENCOUNTER — Encounter: Payer: Self-pay | Admitting: Internal Medicine

## 2022-03-17 ENCOUNTER — Ambulatory Visit (INDEPENDENT_AMBULATORY_CARE_PROVIDER_SITE_OTHER): Payer: Medicare Other

## 2022-03-17 ENCOUNTER — Ambulatory Visit: Payer: Medicare Other | Admitting: Internal Medicine

## 2022-03-17 VITALS — BP 122/76 | HR 70 | Temp 98.0°F | Ht 70.0 in | Wt 258.0 lb

## 2022-03-17 DIAGNOSIS — R7303 Prediabetes: Secondary | ICD-10-CM | POA: Diagnosis not present

## 2022-03-17 DIAGNOSIS — I10 Essential (primary) hypertension: Secondary | ICD-10-CM

## 2022-03-17 DIAGNOSIS — K219 Gastro-esophageal reflux disease without esophagitis: Secondary | ICD-10-CM | POA: Diagnosis not present

## 2022-03-17 DIAGNOSIS — E039 Hypothyroidism, unspecified: Secondary | ICD-10-CM

## 2022-03-17 DIAGNOSIS — R911 Solitary pulmonary nodule: Secondary | ICD-10-CM

## 2022-03-17 DIAGNOSIS — R058 Other specified cough: Secondary | ICD-10-CM

## 2022-03-17 DIAGNOSIS — Z23 Encounter for immunization: Secondary | ICD-10-CM | POA: Diagnosis not present

## 2022-03-17 DIAGNOSIS — G500 Trigeminal neuralgia: Secondary | ICD-10-CM

## 2022-03-17 LAB — BASIC METABOLIC PANEL
BUN: 19 mg/dL (ref 6–23)
CO2: 28 mEq/L (ref 19–32)
Calcium: 9.9 mg/dL (ref 8.4–10.5)
Chloride: 100 mEq/L (ref 96–112)
Creatinine, Ser: 0.77 mg/dL (ref 0.40–1.50)
GFR: 87.78 mL/min (ref >60.00)
Glucose, Bld: 82 mg/dL (ref 70–99)
Potassium: 3.9 mEq/L (ref 3.5–5.1)
Sodium: 139 mEq/L (ref 135–145)

## 2022-03-17 LAB — HEPATIC FUNCTION PANEL
ALT: 19 U/L (ref 0–53)
AST: 20 U/L (ref 0–37)
Albumin: 4 g/dL (ref 3.5–5.2)
Alkaline Phosphatase: 58 U/L (ref 39–117)
Bilirubin, Direct: 0.1 mg/dL (ref 0.0–0.3)
Total Bilirubin: 0.7 mg/dL (ref 0.2–1.2)
Total Protein: 8 g/dL (ref 6.0–8.3)

## 2022-03-17 LAB — CBC WITH DIFFERENTIAL/PLATELET
Basophils Absolute: 0 10*3/uL (ref 0.0–0.1)
Basophils Relative: 0.7 % (ref 0.0–3.0)
Eosinophils Absolute: 0.1 10*3/uL (ref 0.0–0.7)
Eosinophils Relative: 2.6 % (ref 0.0–5.0)
HCT: 43.5 % (ref 39.0–52.0)
Hemoglobin: 14.5 g/dL (ref 13.0–17.0)
Lymphocytes Relative: 34.7 % (ref 12.0–46.0)
Lymphs Abs: 1.8 10*3/uL (ref 0.7–4.0)
MCHC: 33.4 g/dL (ref 30.0–36.0)
MCV: 95.6 fl (ref 78.0–100.0)
Monocytes Absolute: 0.5 10*3/uL (ref 0.1–1.0)
Monocytes Relative: 10.5 % (ref 3.0–12.0)
Neutro Abs: 2.7 10*3/uL (ref 1.4–7.7)
Neutrophils Relative %: 51.5 % (ref 43.0–77.0)
Platelets: 207 10*3/uL (ref 150.0–400.0)
RBC: 4.55 Mil/uL (ref 4.22–5.81)
RDW: 13.6 % (ref 11.5–15.5)
WBC: 5.2 10*3/uL (ref 4.0–10.5)

## 2022-03-17 LAB — TSH: TSH: 2 u[IU]/mL (ref 0.35–5.50)

## 2022-03-17 LAB — HEMOGLOBIN A1C: Hgb A1c MFr Bld: 6.1 % (ref 4.6–6.5)

## 2022-03-17 NOTE — Progress Notes (Signed)
Subjective:  Patient ID: [name] [name], male    DOB: [birth year]  Age: 76 y.o. MRN: [id number]  CC: Cough   HPI [name] [name] presents for f/up -  He complains of chronic cough and shortness of breath.  The cough is productive of green phlegm.  He continues to complain of right facial pain and tells me he is not taking carbamazepine.  Outpatient Medications Prior to Visit  Medication Sig Dispense Refill   aspirin EC 81 MG tablet Take 1 tablet (81 mg total) by mouth 2 (two) times daily. 30 tablet    cholecalciferol (VITAMIN D3) 25 MCG (1000 UNIT) tablet Take 2,000 Units by mouth daily.     DULoxetine (CYMBALTA) 30 MG capsule TAKE 1 CAPSULE BY MOUTH DAILY 90 capsule 0   furosemide (LASIX) 40 MG tablet TAKE 1 TABLET BY MOUTH DAILY 90 tablet 3   levothyroxine (SYNTHROID) 125 MCG tablet TAKE 1 TABLET BY MOUTH DAILY 90 tablet 0   losartan-hydrochlorothiazide (HYZAAR) 100-25 MG tablet TAKE ONE-HALF TABLET BY  MOUTH DAILY 45 tablet 3   metoprolol succinate (TOPROL-XL) 25 MG 24 hr tablet Take 1 tablet (25 mg total) by mouth daily. 90 tablet 2   Multiple Vitamins-Minerals (ICAPS AREDS 2 PO) Take 1 capsule by mouth daily.     Omega-3 Fatty Acids (SUPER OMEGA-3) 1000 MG CAPS Take by mouth.     omeprazole (PRILOSEC) 40 MG capsule TAKE 1 CAPSULE BY MOUTH TWICE  DAILY 180 capsule 0   polyethylene glycol (MIRALAX / GLYCOLAX) 17 g packet Take 17 g by mouth daily.     potassium chloride SA (KLOR-CON M) 20 MEQ tablet TAKE 1 TABLET BY MOUTH DAILY 90 tablet 3   simvastatin (ZOCOR) 20 MG tablet TAKE 1 TABLET BY MOUTH DAILY AT  6 PM. 90 tablet 0   carbamazepine (TEGRETOL) 200 MG tablet Take 1 tablet (200 mg total) by mouth in the morning and at bedtime. 60 tablet 0   HYDROcodone-acetaminophen (NORCO) 7.5-325 MG tablet Take 1 tablet by mouth every 6 (six) hours as needed for moderate pain. 30 tablet 0   No facility-administered medications prior to visit.    ROS Review of Systems  Constitutional:   Negative for diaphoresis and fatigue.  HENT: Negative.  Negative for facial swelling.   Eyes: Negative.   Respiratory:  Positive for cough and shortness of breath. Negative for chest tightness and wheezing.   Cardiovascular:  Negative for chest pain, palpitations and leg swelling.  Gastrointestinal:  Negative for abdominal pain, constipation, diarrhea, nausea and vomiting.  Endocrine: Negative.   Genitourinary:  Negative for difficulty urinating.  Musculoskeletal: Negative.  Negative for arthralgias.  Skin: Negative.   Allergic/Immunologic: Negative.   Neurological: Negative.  Negative for headaches.  Hematological:  Negative for adenopathy. Does not bruise/bleed easily.  Psychiatric/Behavioral: Negative.      Objective:  BP 122/76 (BP Location: Left Arm, Patient Position: Sitting, Cuff Size: Normal)   Pulse 70   Temp 98 F (36.7 C) (Oral)   Ht '5\' 10"'$  (1.778 m)   Wt 258 lb (117 kg)   SpO2 98%   BMI 37.02 kg/m   BP Readings from Last 3 Encounters:  03/17/22 122/76  12/01/21 114/68  11/20/21 (!) 106/58    Wt Readings from Last 3 Encounters:  03/17/22 258 lb (117 kg)  12/01/21 261 lb (118.4 kg)  09/22/21 261 lb (118.4 kg)    Physical Exam Vitals reviewed.  Constitutional:      General: He is not  in acute distress.    Appearance: He is not toxic-appearing or diaphoretic.  HENT:     Mouth/Throat:     Mouth: Mucous membranes are moist.  Eyes:     General: No scleral icterus.    Conjunctiva/sclera: Conjunctivae normal.  Cardiovascular:     Rate and Rhythm: Normal rate and regular rhythm.     Heart sounds: No murmur heard. Pulmonary:     Effort: Pulmonary effort is normal.     Breath sounds: No stridor. No wheezing, rhonchi or rales.  Abdominal:     General: Abdomen is protuberant. Bowel sounds are normal. There is no distension.     Palpations: Abdomen is soft. There is no hepatomegaly, splenomegaly or mass.     Tenderness: There is no abdominal tenderness.   Musculoskeletal:        General: Normal range of motion.     Cervical back: Neck supple.     Right lower leg: No edema.     Left lower leg: No edema.  Lymphadenopathy:     Cervical: No cervical adenopathy.  Skin:    General: Skin is warm and dry.  Neurological:     General: No focal deficit present.     Mental Status: He is alert.  Psychiatric:        Mood and Affect: Mood normal.        Behavior: Behavior normal.     Lab Results  Component Value Date   WBC 5.2 03/17/2022   HGB 14.5 03/17/2022   HCT 43.5 03/17/2022   PLT 207.0 03/17/2022   GLUCOSE 82 03/17/2022   CHOL 107 08/26/2021   TRIG 97 08/26/2021   HDL 32 (L) 08/26/2021   LDLCALC 56 08/26/2021   ALT 19 03/17/2022   AST 20 03/17/2022   NA 139 03/17/2022   K 3.9 03/17/2022   CL 100 03/17/2022   CREATININE 0.77 03/17/2022   BUN 19 03/17/2022   CO2 28 03/17/2022   TSH 2.00 03/17/2022   PSA 0.25 09/22/2021   INR 1.03 07/29/2015   HGBA1C 6.1 03/17/2022    CT HEAD WO CONTRAST (5MM)  Result Date: 12/29/2021 CLINICAL DATA:  Headache, sudden, severe. Right-sided facial pain for 2 months. EXAM: CT HEAD WITHOUT CONTRAST TECHNIQUE: Contiguous axial images were obtained from the base of the skull through the vertex without intravenous contrast. RADIATION DOSE REDUCTION: This exam was performed according to the departmental dose-optimization program which includes automated exposure control, adjustment of the mA and/or kV according to patient size and/or use of iterative reconstruction technique. COMPARISON:  01/11/2019. FINDINGS: Brain: No acute intracranial hemorrhage, midline shift or mass effect. No extra-axial fluid collection. Generalized atrophy is noted. Periventricular white matter hypodensities are noted bilaterally. No hydrocephalus. 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 Vascular: Atherosclerotic calcification of the carotid siphons and vertebral arteries. No hyperdense vessels.  Skull: Normal. Negative for fracture or focal lesion. Sinuses/Orbits: There is partial opacification of the ethmoid air cells on the right. No acute orbital abnormality. Other: None. IMPRESSION: 1. No acute intracranial process. 2. Atrophy with chronic microvascular ischemic changes. 3. 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 Electronically Signed   By: Brett Fairy M.D.   On: 12/29/2021 04:13   DG Chest 2 View  Result Date: 03/17/2022 CLINICAL DATA:  cough - green phelgm EXAM: CHEST - 2 VIEW COMPARISON:  Chest x-[facility] August 24, 2017. FINDINGS: Scattered pulmonary nodules, better characterized on  CT chest 2020. No consolidation. No visible pleural effusions or pneumothorax. Cardiomediastinal silhouette is unchanged. IMPRESSION: 1. No evidence of acute cardiopulmonary disease. 2. Scattered pulmonary nodules, better characterized on CT chest 2020. Electronically Signed   By: Margaretha Sheffield M.D.   On: 03/17/2022 10:00     Assessment & Plan:   [name] [name] was seen today for cough.  Diagnoses and all orders for this visit:  Essential hypertension- His blood pressure is adequately well controlled. -     Basic metabolic panel; Future -     Hepatic function panel; Future -     Hepatic function panel -     Basic metabolic panel  Gastroesophageal reflux disease without esophagitis -     Hepatic function panel; Future -     CBC with Differential/Platelet; Future -     CBC with Differential/Platelet -     Hepatic function panel  Acquired hypothyroidism- He is euthyroid. -     TSH; Future -     Hepatic function panel; Future -     Hepatic function panel -     TSH  Prediabetes -     Basic metabolic panel; Future -     Hemoglobin A1c; Future -     Hepatic function panel; Future -     Hepatic function panel -     Hemoglobin A1c -     Basic metabolic panel  Flu vaccine need -     Flu Vaccine QUAD High Dose(Fluad)  Trigeminal neuralgia of right side of face -      Ambulatory referral to Neurology  Cough productive of purulent sputum- His chest x-[place] is negative for mass or infiltrate.  His nodules are stable.  I have asked him to see pulmonary to consider PFTs. -     DG Chest 2 View; Future -     Ambulatory referral to Pulmonology  Pulmonary nodule -     Ambulatory referral to Pulmonology   I have discontinued Elwin D. Minus's carbamazepine and HYDROcodone-acetaminophen. I am also having him maintain his polyethylene glycol, cholecalciferol, Multiple Vitamins-Minerals (ICAPS AREDS 2 PO), Super Omega-3, aspirin EC, potassium chloride SA, furosemide, losartan-hydrochlorothiazide, metoprolol succinate, levothyroxine, omeprazole, DULoxetine, and simvastatin.  No orders of the defined types were placed in this encounter.    Follow-up: Return in about 3 months (around 06/16/2022).  Scarlette Calico, MD

## 2022-03-17 NOTE — Patient Instructions (Signed)

## 2022-03-26 ENCOUNTER — Encounter: Payer: Self-pay | Admitting: Neurology

## 2022-04-01 ENCOUNTER — Other Ambulatory Visit: Payer: Self-pay | Admitting: Internal Medicine

## 2022-04-01 ENCOUNTER — Encounter: Payer: Self-pay | Admitting: Pulmonary Disease

## 2022-04-01 ENCOUNTER — Ambulatory Visit: Payer: Medicare Other | Admitting: Pulmonary Disease

## 2022-04-01 VITALS — BP 120/70 | HR 75 | Ht 70.0 in | Wt 256.6 lb

## 2022-04-01 DIAGNOSIS — R053 Chronic cough: Secondary | ICD-10-CM | POA: Diagnosis not present

## 2022-04-01 DIAGNOSIS — E039 Hypothyroidism, unspecified: Secondary | ICD-10-CM

## 2022-04-01 DIAGNOSIS — J42 Unspecified chronic bronchitis: Secondary | ICD-10-CM | POA: Diagnosis not present

## 2022-04-01 DIAGNOSIS — R918 Other nonspecific abnormal finding of lung field: Secondary | ICD-10-CM

## 2022-04-01 MED ORDER — TRELEGY ELLIPTA 100-62.5-25 MCG/ACT IN AEPB: 1.0000 | INHALATION_SPRAY | Freq: Once | RESPIRATORY_TRACT | 6 refills | Status: AC

## 2022-04-01 NOTE — Patient Instructions (Addendum)
Thank you for visiting Dr. Valeta Harms at [facility]. Today we recommend the following:  Orders Placed This Encounter  Procedures   CT Chest Wo Contrast   Pulmonary Function Test   Meds ordered this encounter  Medications   Fluticasone-Umeclidin-Vilant (TRELEGY ELLIPTA) 100-62.5-25 MCG/ACT AEPB    Sig: Inhale 1 puff into the lungs once for 1 dose.    Dispense:  1 each    Refill:  6   Return in about 6 weeks (around 05/13/2022) for with APP. Follow up appt after ct and pfts.     Please do your part to reduce the spread of COVID-19.

## 2022-04-01 NOTE — Progress Notes (Signed)
Synopsis: Referred in October 2023 for Cough purulent sputum production by [name], MD  Subjective:   PATIENT ID: [name] [name] GENDER: male DOB: May 16, 1946, MRN: [id number]  Chief Complaint  Patient presents with   Cough    [name], past medical history of coronary disease, gastroesophageal reflux, hypertension, hypothyroidism, sleep apnea.Patient was referred for for cough and purulent sputum production.  Additionally he had a coronary CT scan completed in March 2023 he is found to have multiple small pulmonary nodules measuring 5 mm in size or less.  Patient has a mother with a history of breast cancer paternal uncle with lung cancer, maternal uncle with liver cancer cirrhosis.Patient had a chest x-Jihaad on 03/17/2022 showed no infiltrate still has small pulmonary nodules.  Today patient complains of recurrent bronchitis symptoms. He quit smoking in 1979. Not using any inhalers. Never PFTs. Last ct chest in 2020.  Patient has daily cough and sputum production.  Sometimes he gets choked up.  Never had any PFTs.    Past Medical History:  Diagnosis Date   Arthritis    CAD (coronary artery disease)    cath 3/10: mLAD 30%, pRCA 30%, EF 55-60% Dr. Harrington Challenger cardiologist  most recent  stress stress done ~ 2 years ago with Dr. Harrington Challenger   Complication of anesthesia    "he usually gets an ileus after back OR"    Depression    Dyspnea    -PFTs compeltely normal 03/20/08 including DLc0   Environmental allergies    Dust, Smoke   GERD (gastroesophageal reflux disease)    HTN (hypertension)    Hypothyroidism    Low testosterone    Observed sleep apnea    can't wear cpap   Polycythemia    PONV (postoperative nausea and vomiting)    Pulmonary nodule 06/13/2012   Rosacea    Sleep apnea    Most recent sleep study 2010; records [facility]     Family History  Problem Relation Age of Onset   Breast cancer Mother        dx. mid-70s   Colon polyps Mother         "97+ colon polyps per yr"   Diabetes Mother    Breast cancer Sister 70   Other Sister        + MUTYH mutation   Diabetes Sister        borderline   Heart Problems Sister    Breast cancer Sister        dx. 62s   Heart attack Father    Alzheimer's disease Father    Lung cancer Paternal Uncle        x4   Throat cancer Maternal Uncle        mother's maternal half-brother; dx 77s; +EtOH   Liver cancer Maternal Uncle        mother's maternal half-brother; either liver cancer or cirrhosis   Cirrhosis Maternal Uncle        +EtOH   Stroke Paternal Grandmother    Heart attack Paternal Grandfather    Other Daughter        negative genetic testing in 2017   Parkinson's disease Other    Bipolar disorder Other    Lung cancer Paternal Uncle        (x3)   Lung cancer Paternal Uncle    Alzheimer's disease Cousin        paternal 1st cousin, d. 99   Heart attack Cousin  Anesthesia problems Neg Hx      Past Surgical History:  Procedure Laterality Date   BACK SURGERY  2005; 06/2006; 01/2007; 04/2009   CARDIAC CATHETERIZATION  08/2008   CATARACT EXTRACTION, BILATERAL  2010   CERVICAL FUSION  04/2002   ?C3-4   COLONOSCOPY  12/2005   normal screening study.    ESOPHAGOGASTRODUODENOSCOPY  multiple   last 07/2012 GERD esophagitis, 48 Fr dilation   ESOPHAGOGASTRODUODENOSCOPY N/A 09/19/2015   Procedure: ESOPHAGOGASTRODUODENOSCOPY (EGD);  Surgeon: [name], MD;  Location: [facility] ENDOSCOPY;  Service: Endoscopy;  Laterality: N/A;   KNEE ARTHROSCOPY WITH MEDIAL MENISECTOMY Right 05/14/2015   Procedure: RIGHT KNEE ARTHROSCOPY CHONDROPLASTY, PARTIAL  MEDIAL MENISECTOMY;  Surgeon: [name], MD;  Location: [place];  Service: Orthopedics;  Laterality: Right;   left knee replacement     left shoudler clean     LUMBAR DISC SURGERY  06/2006   L3   MENISCUS REPAIR  08/2008   left   NECK SURGERY     POSTERIOR FUSION LUMBAR SPINE  2005; 01/2007; 04/2009   L5; L3-4; L2-3    PROSTATE SURGERY  2000   SAVORY DILATION N/A 09/19/2015   Procedure: SAVORY DILATION;  Surgeon: [name], MD;  Location: [facility] ENDOSCOPY;  Service: Endoscopy;  Laterality: N/A;   SHOULDER OPEN ROTATOR CUFF REPAIR Left 1999   left   TONSILLECTOMY  09/21/11   TOTAL KNEE ARTHROPLASTY Left 01/2011   left   TOTAL KNEE ARTHROPLASTY Right 08/08/2015   Procedure: RIGHT TOTAL KNEE ARTHROPLASTY;  Surgeon: [name], MD;  Location: [place];  Service: Orthopedics;  Laterality: Right;   TRANSURETHRAL RESECTION OF PROSTATE  2006   followed by "surgery to get rid of clots"   UVULOPALATOPHARYNGOPLASTY, TONSILLECTOMY AND SEPTOPLASTY  09/21/11   Deviated Septum    Social History   Socioeconomic History   Marital status: Married    Spouse name: Not on file   Number of children: 3   Years of education: Not on file   Highest education level: Not on file  Occupational History   Occupation: retired    Fish farm manager: [employer]  Tobacco Use   Smoking status: Former    Packs/day: 1.00    Years: 16.00    Total pack years: 16.00    Types: Cigarettes    Quit date: 07/17/1974    Years since quitting: 47.7   Smokeless tobacco: Former  Scientific laboratory technician Use: Never used  Substance and Sexual Activity   Alcohol use: No   Drug use: No   Sexual activity: Not on file  Other Topics Concern   Not on file  Social History Narrative   Area, 3 children.   Retired from the city of [place]   5 caffeinated beverages daily   Social Determinants of Health   Financial Resource Strain: Low Risk  (10/07/2021)   Overall Financial Resource Strain (CARDIA)    Difficulty of Paying Living Expenses: Not hard at all  Food Insecurity: No Food Insecurity (10/07/2021)   Hunger Vital Sign    Worried About Running Out of Food in the Last Year: Never true    Ran Out of Food in the Last Year: Never true  Transportation Needs: No Transportation Needs (10/07/2021)   PRAPARE - Hydrologist  (Medical): No    Lack of Transportation (Non-Medical): No  Physical Activity: Inactive (10/07/2021)   Exercise Vital Sign    Days of Exercise per Week: 0  days    Minutes of Exercise per Session: 0 min  Stress: No Stress Concern Present (10/07/2021)   [place]    Feeling of Stress : Not at all  Social Connections: [place] (10/07/2021)   Social Connection and Isolation Panel [NHANES]    Frequency of Communication with Friends and Family: More than three times a week    Frequency of Social Gatherings with Friends and Family: More than three times a week    Attends Religious Services: More than 4 times per year    Active Member of [employer] or Organizations: Yes    Attends Music therapist: More than 4 times per year    Marital Status: Married  Human resources officer Violence: Not At Risk (10/07/2021)   Humiliation, Afraid, Rape, and Kick questionnaire    Fear of Current or Ex-Partner: No    Emotionally Abused: No    Physically Abused: No    Sexually Abused: No     Allergies  Allergen Reactions   Penicillins Hives and Other (See Comments)    "I break out in welts" Has patient had a PCN reaction causing immediate rash, facial/tongue/throat swelling, SOB or lightheadedness with hypotension: No Has patient had a PCN reaction causing severe rash involving mucus membranes or skin necrosis: No Has patient had a PCN reaction that required hospitalization No Has patient had a PCN reaction occurring within the last 10 years: No If all of the above answers are "NO", then may proceed with Cephalosporin use.   Codeine Nausea And Vomiting   Sulfonamide Derivatives Nausea And Vomiting     Outpatient Medications Prior to Visit  Medication Sig Dispense Refill   aspirin EC 81 MG tablet Take 1 tablet (81 mg total) by mouth 2 (two) times daily. 30 tablet    cholecalciferol (VITAMIN D3) 25 MCG (1000 UNIT) tablet Take  2,000 Units by mouth daily.     DULoxetine (CYMBALTA) 30 MG capsule TAKE 1 CAPSULE BY MOUTH DAILY 90 capsule 0   furosemide (LASIX) 40 MG tablet TAKE 1 TABLET BY MOUTH DAILY 90 tablet 3   levothyroxine (SYNTHROID) 125 MCG tablet TAKE 1 TABLET BY MOUTH DAILY 90 tablet 0   losartan-hydrochlorothiazide (HYZAAR) 100-25 MG tablet TAKE ONE-HALF TABLET BY  MOUTH DAILY 45 tablet 3   metoprolol succinate (TOPROL-XL) 25 MG 24 hr tablet Take 1 tablet (25 mg total) by mouth daily. 90 tablet 2   Multiple Vitamins-Minerals (ICAPS AREDS 2 PO) Take 1 capsule by mouth daily.     Omega-3 Fatty Acids (SUPER OMEGA-3) 1000 MG CAPS Take by mouth.     omeprazole (PRILOSEC) 40 MG capsule TAKE 1 CAPSULE BY MOUTH TWICE  DAILY 180 capsule 0   polyethylene glycol (MIRALAX / GLYCOLAX) 17 g packet Take 17 g by mouth daily.     potassium chloride SA (KLOR-CON M) 20 MEQ tablet TAKE 1 TABLET BY MOUTH DAILY 90 tablet 3   simvastatin (ZOCOR) 20 MG tablet TAKE 1 TABLET BY MOUTH DAILY AT  6 PM. 90 tablet 0   No facility-administered medications prior to visit.    Review of Systems  Constitutional:  Negative for chills, fever, malaise/fatigue and weight loss.  HENT:  Negative for hearing loss, sore throat and tinnitus.   Eyes:  Negative for blurred vision and double vision.  Respiratory:  Positive for cough and sputum production. Negative for hemoptysis, shortness of breath, wheezing and stridor.   Cardiovascular:  Negative for chest pain, palpitations,  orthopnea, leg swelling and PND.  Gastrointestinal:  Negative for abdominal pain, constipation, diarrhea, heartburn, nausea and vomiting.  Genitourinary:  Negative for dysuria, hematuria and urgency.  Musculoskeletal:  Negative for joint pain and myalgias.  Skin:  Negative for itching and rash.  Neurological:  Negative for dizziness, tingling, weakness and headaches.  Endo/Heme/Allergies:  Negative for environmental allergies. Does not bruise/bleed easily.   Psychiatric/Behavioral:  Negative for depression. The patient is not nervous/anxious and does not have insomnia.   All other systems reviewed and are negative.    Objective:  Physical Exam Vitals reviewed.  Constitutional:      General: He is not in acute distress.    Appearance: He is well-developed. He is obese.  HENT:     Head: Normocephalic and atraumatic.  Eyes:     General: No scleral icterus.    Conjunctiva/sclera: Conjunctivae normal.     Pupils: Pupils are equal, round, and reactive to light.  Neck:     Vascular: No JVD.     Trachea: No tracheal deviation.  Cardiovascular:     Rate and Rhythm: Normal rate and regular rhythm.     Heart sounds: Normal heart sounds. No murmur heard. Pulmonary:     Effort: Pulmonary effort is normal. No tachypnea, accessory muscle usage or respiratory distress.     Breath sounds: No stridor. No wheezing, rhonchi or rales.  Abdominal:     General: There is no distension.     Palpations: Abdomen is soft.     Tenderness: There is no abdominal tenderness.  Musculoskeletal:        General: No tenderness.     Cervical back: Neck supple.     Right lower leg: Edema present.     Left lower leg: Edema present.  Lymphadenopathy:     Cervical: No cervical adenopathy.  Skin:    General: Skin is warm and dry.     Capillary Refill: Capillary refill takes less than 2 seconds.     Findings: No rash.  Neurological:     Mental Status: He is alert and oriented to person, place, and time.  Psychiatric:        Behavior: Behavior normal.      Vitals:   04/01/22 1119  BP: 120/70  Pulse: 75  SpO2: 95%  Weight: 256 lb 9.6 oz (116.4 kg)  Height: '5\' 10"'$  (1.778 m)   95% on RA BMI Readings from Last 3 Encounters:  04/01/22 36.82 kg/m  03/17/22 37.02 kg/m  12/01/21 37.45 kg/m   Wt Readings from Last 3 Encounters:  04/01/22 256 lb 9.6 oz (116.4 kg)  03/17/22 258 lb (117 kg)  12/01/21 261 lb (118.4 kg)     CBC    Component Value  Date/Time   WBC 5.2 03/17/2022 0930   RBC 4.55 03/17/2022 0930   HGB 14.5 03/17/2022 0930   HGB 14.7 05/12/2020 1116   HGB 14.8 12/11/2013 0805   HCT 43.5 03/17/2022 0930   HCT 43.4 05/12/2020 1116   HCT 45.2 12/11/2013 0805   PLT 207.0 03/17/2022 0930   PLT 211 05/12/2020 1116   MCV 95.6 03/17/2022 0930   MCV 89 05/12/2020 1116   MCV 93.7 12/11/2013 0805   MCH 30.1 05/12/2020 1116   MCH 31.3 09/18/2015 0851   MCHC 33.4 03/17/2022 0930   RDW 13.6 03/17/2022 0930   RDW 13.2 05/12/2020 1116   RDW 13.9 12/11/2013 0805   LYMPHSABS 1.8 03/17/2022 0930   LYMPHSABS 1.3 12/11/2013 0805   MONOABS  0.5 03/17/2022 0930   MONOABS 0.5 12/11/2013 0805   EOSABS 0.1 03/17/2022 0930   EOSABS 0.1 12/11/2013 0805   BASOSABS 0.0 03/17/2022 0930   BASOSABS 0.0 12/11/2013 0805    Chest Imaging:  Chest x-Rawn 03/17/2022: Some scattered pulmonary nodules. Previous imaging dates these back for a few years.  There is been no additional follow-up so not sure if any of them are new or not he is a former smoker. The patient's images have been independently reviewed by me.    Pulmonary Functions Testing Results:     No data to display          FeNO:   Pathology:   Echocardiogram:   Heart Catheterization:  This is a    Assessment & Plan:     ICD-10-CM   1. Multiple pulmonary nodules  R91.8 CT Chest Wo Contrast    Pulmonary Function Test    2. Chronic cough  R05.3     3. Chronic bronchitis, unspecified chronic bronchitis type ([place])  J42       Discussion:  [name], multiple pulmonary nodules on previous images.  He was referred actually for chronic cough purulent sputum production.  And chronic bronchitis symptoms.  Nodules have been found incidentally.  Overall believe his lung nodules are likely benign. If they document stability since 2020 CT imaging of the chest then we will not need any additional follow-up.  Plan: He is a former smoker quit in the late  70s. He has chronic cough sputum production. We will give him a trial of an inhaler to see if this helps with his chronic bronchitis symptoms. We will also get pulmonary function test completed prior to next office visit. He also need a repeat noncontrasted CT scan of the chest as his last comparison was in 2020. He can follow-up with [name] after both of these test have been complete.   Current Outpatient Medications:    aspirin EC 81 MG tablet, Take 1 tablet (81 mg total) by mouth 2 (two) times daily., Disp: 30 tablet, Rfl:    cholecalciferol (VITAMIN D3) 25 MCG (1000 UNIT) tablet, Take 2,000 Units by mouth daily., Disp: , Rfl:    DULoxetine (CYMBALTA) 30 MG capsule, TAKE 1 CAPSULE BY MOUTH DAILY, Disp: 90 capsule, Rfl: 0   furosemide (LASIX) 40 MG tablet, TAKE 1 TABLET BY MOUTH DAILY, Disp: 90 tablet, Rfl: 3   levothyroxine (SYNTHROID) 125 MCG tablet, TAKE 1 TABLET BY MOUTH DAILY, Disp: 90 tablet, Rfl: 0   losartan-hydrochlorothiazide (HYZAAR) 100-25 MG tablet, TAKE ONE-HALF TABLET BY  MOUTH DAILY, Disp: 45 tablet, Rfl: 3   metoprolol succinate (TOPROL-XL) 25 MG 24 hr tablet, Take 1 tablet (25 mg total) by mouth daily., Disp: 90 tablet, Rfl: 2   Multiple Vitamins-Minerals (ICAPS AREDS 2 PO), Take 1 capsule by mouth daily., Disp: , Rfl:    Omega-3 Fatty Acids (SUPER OMEGA-3) 1000 MG CAPS, Take by mouth., Disp: , Rfl:    omeprazole (PRILOSEC) 40 MG capsule, TAKE 1 CAPSULE BY MOUTH TWICE  DAILY, Disp: 180 capsule, Rfl: 0   polyethylene glycol (MIRALAX / GLYCOLAX) 17 g packet, Take 17 g by mouth daily., Disp: , Rfl:    potassium chloride SA (KLOR-CON M) 20 MEQ tablet, TAKE 1 TABLET BY MOUTH DAILY, Disp: 90 tablet, Rfl: 3   simvastatin (ZOCOR) 20 MG tablet, TAKE 1 TABLET BY MOUTH DAILY AT  6 PM., Disp: 90 tablet, Rfl: 0  Garner Nash, DO [facility] 04/01/2022 11:46 AM

## 2022-04-10 ENCOUNTER — Ambulatory Visit (HOSPITAL_BASED_OUTPATIENT_CLINIC_OR_DEPARTMENT_OTHER): Payer: Medicare Other

## 2022-04-20 ENCOUNTER — Telehealth: Payer: Medicare Other

## 2022-05-28 NOTE — Progress Notes (Unsigned)
Initial neurology clinic note  CACHE BILLS MRN: [id number] DOB: 12-07-1945  Referring provider: [name], MD  Primary care provider: [name], MD  Reason for consult:  Right facial pain  Subjective:  This is [name], a 76 y.o. right-handed male with a medical history of HTN, CAD, depression, OSA (not on CPAP), GERD, hypothyroidism, pre-diabetes, cervical spine disease s/p surgery (about 15 years ago, does not remember levels), lumbar spine disease s/p surgery x4, and OA who presents to neurology clinic with right face pain. The patient is alone today.  Patient's right facial pain started about 3-4 months ago. Patient states it was initially in right forehead, cheek, jaw, and neck. It is now just in right jaw. If he coughs, he will notice the pain. It feels like a bee sting. It comes and goes quickly. It lasts a few seconds. Touching his face does not significantly change symptoms. He has some pain in the temple area of the head like a headache as well.   He denies vision changes. He has the bee sting sensation when chewing but no clear jaw claudication. He lost about 10 lbs over the year but has gained 5 lbs lately and staying steady.  He has popping in his right jaw when opening and closing. He does not have a significant prior history of headaches. He does have chronic neck pain.   Patient was initially put on gabapentin by his back doctor for neck and back pain. He was taking this with Cymbalta and did help some with the neck. He is still on Cymbalta 30 mg daily but not gabapentin. He stopped gabapentin because he thinks he was swelling up. Per notes, patient was later changed to Tegretol 200. Patient does not remember this and does not currently take this.  Patient mentions his entire right side is weak compared to the left. 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 Patient is on asa 81 mg BID and simvastatin  daily.  Patient has significant pain in his back and difficulty walking. He walks with a walker. He falls about 2 times per month. He has tried PT in the past (several years ago), but patient says it does not help.  Of note, patient was seen at [facility] (most recently in 2020) for RUE weakness, felt to be secondary to C5-6 radiculopathy, and gait instability, felt to be due to lumbar spine disease and chronic knee pain.  MEDICATIONS:  Outpatient Encounter Medications as of 06/02/2022  Medication Sig   aspirin EC 81 MG tablet Take 1 tablet (81 mg total) by mouth 2 (two) times daily.   cholecalciferol (VITAMIN D3) 25 MCG (1000 UNIT) tablet Take 2,000 Units by mouth daily.   DULoxetine (CYMBALTA) 30 MG capsule TAKE 1 CAPSULE BY MOUTH DAILY   Fluticasone-Umeclidin-Vilant (TRELEGY ELLIPTA) 100-62.5-25 MCG/ACT AEPB Inhale into the lungs.   furosemide (LASIX) 40 MG tablet TAKE 1 TABLET BY MOUTH DAILY   levothyroxine (SYNTHROID) 125 MCG tablet TAKE 1 TABLET BY MOUTH DAILY   losartan-hydrochlorothiazide (HYZAAR) 100-25 MG tablet TAKE ONE-HALF TABLET BY  MOUTH DAILY   metoprolol succinate (TOPROL-XL) 25 MG 24 hr tablet Take 1 tablet (25 mg total) by mouth daily.   Multiple Vitamins-Minerals (ICAPS AREDS 2 PO) Take 1 capsule by mouth daily.   omeprazole (PRILOSEC) 40 MG capsule TAKE 1 CAPSULE BY MOUTH TWICE  DAILY   polyethylene glycol (MIRALAX / GLYCOLAX) 17 g  packet Take 17 g by mouth daily.   potassium chloride SA (KLOR-CON M) 20 MEQ tablet TAKE 1 TABLET BY MOUTH DAILY   simvastatin (ZOCOR) 20 MG tablet TAKE 1 TABLET BY MOUTH DAILY AT  6 PM.   Omega-3 Fatty Acids (SUPER OMEGA-3) 1000 MG CAPS Take by mouth. (Patient not taking: Reported on 06/02/2022)   No facility-administered encounter medications on file as of 06/02/2022.    PAST MEDICAL HISTORY: Past Medical History:  Diagnosis Date   Arthritis    CAD (coronary artery disease)    cath 3/10: mLAD 30%, pRCA 30%, EF 55-60% Dr. Harrington Challenger cardiologist  most  recent  stress stress done ~ 2 years ago with Dr. Harrington Challenger   Complication of anesthesia    "he usually gets an ileus after back OR"    Depression    Dyspnea    -PFTs compeltely normal 03/20/08 including DLc0   Environmental allergies    Dust, Smoke   GERD (gastroesophageal reflux disease)    HTN (hypertension)    Hypothyroidism    Low testosterone    Observed sleep apnea    can't wear cpap   Polycythemia    PONV (postoperative nausea and vomiting)    Pulmonary nodule 06/13/2012   Rosacea    Sleep apnea    Most recent sleep study 2010; records at [facility] office    PAST SURGICAL HISTORY: Past Surgical History:  Procedure Laterality Date   BACK SURGERY  2005; 06/2006; 01/2007; 04/2009   CARDIAC CATHETERIZATION  08/2008   CATARACT EXTRACTION, BILATERAL  2010   CERVICAL FUSION  04/2002   ?C3-4   COLONOSCOPY  12/2005   normal screening study.    ESOPHAGOGASTRODUODENOSCOPY  multiple   last 07/2012 GERD esophagitis, 48 Fr dilation   ESOPHAGOGASTRODUODENOSCOPY N/A 09/19/2015   Procedure: ESOPHAGOGASTRODUODENOSCOPY (EGD);  Surgeon: [name], MD;  Location: [facility] ENDOSCOPY;  Service: Endoscopy;  Laterality: N/A;   KNEE ARTHROSCOPY WITH MEDIAL MENISECTOMY Right 05/14/2015   Procedure: RIGHT KNEE ARTHROSCOPY CHONDROPLASTY, PARTIAL  MEDIAL MENISECTOMY;  Surgeon: [name], MD;  Location: [place];  Service: Orthopedics;  Laterality: Right;   left knee replacement     left shoudler clean     LUMBAR DISC SURGERY  06/2006   L3   MENISCUS REPAIR  08/2008   left   NECK SURGERY     POSTERIOR FUSION LUMBAR SPINE  2005; 01/2007; 04/2009   L5; L3-4; L2-3   PROSTATE SURGERY  2000   SAVORY DILATION N/A 09/19/2015   Procedure: SAVORY DILATION;  Surgeon: [name], MD;  Location: [facility] ENDOSCOPY;  Service: Endoscopy;  Laterality: N/A;   SHOULDER OPEN ROTATOR CUFF REPAIR Left 1999   left   TONSILLECTOMY  09/21/11   TOTAL KNEE ARTHROPLASTY Left 01/2011   left   TOTAL KNEE  ARTHROPLASTY Right 08/08/2015   Procedure: RIGHT TOTAL KNEE ARTHROPLASTY;  Surgeon: [name], MD;  Location: [place];  Service: Orthopedics;  Laterality: Right;   TRANSURETHRAL RESECTION OF PROSTATE  2006   followed by "surgery to get rid of clots"   UVULOPALATOPHARYNGOPLASTY, TONSILLECTOMY AND SEPTOPLASTY  09/21/11   Deviated Septum    ALLERGIES: Allergies  Allergen Reactions   Penicillins Hives and Other (See Comments)    "I break out in welts" Has patient had a PCN reaction causing immediate rash, facial/tongue/throat swelling, SOB or lightheadedness with hypotension: No Has patient had a PCN reaction causing severe rash involving mucus membranes or skin necrosis: No Has patient had a  PCN reaction that required hospitalization No Has patient had a PCN reaction occurring within the last 10 years: No If all of the above answers are "NO", then may proceed with Cephalosporin use.   Codeine Nausea And Vomiting   Sulfonamide Derivatives Nausea And Vomiting    FAMILY HISTORY: Family History  Problem Relation Age of Onset   Breast cancer Mother        dx. mid-70s   Colon polyps Mother        "52+ colon polyps per yr"   Diabetes Mother    Breast cancer Sister 83   Other Sister        + MUTYH mutation   Diabetes Sister        borderline   Heart Problems Sister    Breast cancer Sister        dx. 34s   Heart attack Father    Alzheimer's disease Father    Lung cancer Paternal Uncle        x4   Throat cancer Maternal Uncle        mother's maternal half-brother; dx 65s; +EtOH   Liver cancer Maternal Uncle        mother's maternal half-brother; either liver cancer or cirrhosis   Cirrhosis Maternal Uncle        +EtOH   Stroke Paternal Grandmother    Heart attack Paternal Grandfather    Other Daughter        negative genetic testing in 2017   Parkinson's disease Other    Bipolar disorder Other    Lung cancer Paternal Uncle        (x3)   Lung cancer Paternal Uncle     Alzheimer's disease Cousin        paternal 1st cousin, d. 68   Heart attack Cousin    Anesthesia problems Neg Hx     SOCIAL HISTORY: Social History   Tobacco Use   Smoking status: Former    Packs/day: 1.00    Years: 16.00    Total pack years: 16.00    Types: Cigarettes    Quit date: 07/17/1974    Years since quitting: 47.9   Smokeless tobacco: Former  Scientific laboratory technician Use: Never used  Substance Use Topics   Alcohol use: No   Drug use: No   Social History   Social History Narrative   Area, 3 children.Retired from the city of [employer] caffeinated beverages daily   Home is one story with wife    Objective:  Vital Signs:  BP 114/70   Pulse 81   Ht _0  (1.778 m)   Wt 258 lb (117 kg)   SpO2 97%   BMI 37.02 kg/m   General: No acute distress.  Patient appears well-groomed.   Head:  Normocephalic/atraumatic. No temporal tenderness. Popping of TMJ bilaterally Neck: supple, reduced range of motion Heart: regular rate and rhythm Lungs: Clear to auscultation bilaterally. Vascular: No carotid bruits.  Neurological Exam: Mental status: alert and oriented, speech fluent and not dysarthric, language intact.  Cranial nerves: CN I: not tested CN II: pupils equal, round and reactive to light, visual fields intact CN III, IV, VI:  full range of motion, no nystagmus, no ptosis CN V: facial sensation intact. CN VII: upper and lower face symmetric CN VIII: hearing intact CN IX, X: gag intact, uvula midline CN XI: sternocleidomastoid and trapezius muscles intact CN XII: tongue midline  Bulk & Tone: normal, no fasciculations. Motor:  muscle strength 5/5  in left upper and lower extremities; 5- in right upper and lower extremity. Deep Tendon Reflexes:  2+ in bilateral upper extremities, 1+ in bilateral patella, 0 at bilateral ankles Sensation:  Pinprick, temperature and vibratory sensation intact. Finger to nose testing:  Without dysmetria.   Heel to shin:  Without  dysmetria.   Gait:  Normal station and stride.  Romberg negative.   Labs and Imaging review: Internal labs: Lab Results  Component Value Date   HGBA1C 6.1 03/17/2022   Lab Results  Component Value Date   [id number] 756 01/27/2016   Lab Results  Component Value Date   TSH 2.00 03/17/2022   Lab Results  Component Value Date   ESRSEDRATE 12 01/27/2016   Vit D (08/26/21): 43.8 CBC, BMP, LFTs: wnl  Imaging: CT head wo contrast (12/29/21): FINDINGS: Brain: No acute intracranial hemorrhage, midline shift or mass effect. No extra-axial fluid collection. Generalized atrophy is noted. Periventricular white matter hypodensities are noted bilaterally. No hydrocephalus. 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   Vascular: Atherosclerotic calcification of the carotid siphons and vertebral arteries. No hyperdense vessels.   Skull: Normal. Negative for fracture or focal lesion.   Sinuses/Orbits: There is partial opacification of the ethmoid air cells on the right. No acute orbital abnormality.   Other: None.   IMPRESSION: 1. No acute intracranial process. 2. Atrophy with chronic microvascular ischemic changes. 3. 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  MRI thoracic spine wo contrast (05/02/2018): IMPRESSION: 1. Mild multilevel spondylosis of the thoracic spine without focal compressive stenosis.  MRI brain w/wo contrast (01/11/2019): FINDINGS: Brain: Diffusion imaging does not show any acute or subacute infarction. Brainstem and cerebellum are normal. Cerebral hemispheres show mild age related volume loss with mild chronic small-vessel change of the deep white matter, less than often seen at this age. No cortical or large vessel territory infarction. No mass lesion, hemorrhage, hydrocephalus or extra-axial collection after contrast administration, no abnormal enhancement occurs.   Vascular: Major vessels at the  base of the brain show flow.   Skull and upper cervical spine: Negative   Sinuses/Orbits: Clear/normal.  Previous lens implants.   Other: None   IMPRESSION: No cause of the presenting symptoms is identified. Essentially normal study for age with mild volume loss and minimal small vessel change of the hemispheric white matter.  MRI cervical spine wo contrast (02/02/2019): FINDINGS: :  On sagittal images, the spine is imaged from above the cervicomedullary junction to T2.   The spinal cord is of normal caliber.  There is T2 hyperintense signal within the cord adjacent to C3-C4 consistent with chronic mild low malacia, stable compared to the previous MRI.[name]  There is a solid fusion at C3-C4 associated with ACDF hardware.  Reduced disc height is noted at C4-C5, C6-C7 and C7-T1.  The vertebral bodies are normally aligned.   The vertebral bodies have normal signal.     The discs and interspaces were further evaluated on axial views from C2 to T1 as follows:   C2-C3: There is a small right disc osteophyte complex causing mild right foraminal narrowing but no nerve root compression or spinal stenosis.   C3-C4: This level is fused.  There is a right paramedian disc osteophyte complex and right facet hypertrophy causing moderate right foraminal narrowing.  There is also mild left foraminal narrowing.  No nerve root compression or spinal stenosis.   C4-C5: There are disc osteophyte  complexes and facet hypertrophy.  This causes mild spinal stenosis and moderate bilateral foraminal narrowing.   C5-C6: There are disc osteophyte complexes, right greater than left causing mild spinal stenosis, severe right foraminal narrowing and moderate left foraminal narrowing.   C6-C7: There is disc protrusion and uncovertebral spurring and ligamenta flava hypertrophy causing mild spinal stenosis and moderate bilateral foraminal narrowing.   C7-T1: There is a right disc osteophyte complex, ligamenta flava hypertrophy  and left greater than right facet hypertrophy.  There is no spinal stenosis but there is progressed moderate right and stable mild to moderate left foraminal narrowing.   T1-T2: There is disc protrusion and ligamenta flava hypertrophy causing borderline spinal stenosis and mild to moderate foraminal narrowing.   When compared to the MRI dated 02/22/2018, changes at C7-T1 have progressed with more foraminal narrowing to the right on the current study.   IMPRESSION: This MRI of the cervical spine without contrast shows the following: 1.   At C3-C4, there is stable ACDF.  There is moderate right foraminal narrowing but no definite nerve root compression or spinal stenosis. 2.   At C4-C5, there are stable degenerative changes causing stable mild spinal stenosis and moderate foraminal narrowing but no definite nerve root compression.. 3.   At C5-C6, there are stable degenerative changes causing mild spinal stenosis and severe right foraminal narrowing with potential for right C6 nerve root compression 4.   At C6-C7, there are stable degenerative changes causing mild spinal stenosis and moderate foraminal narrowing but no definite nerve root compression. 5.   At C7-T1, there are progressive degenerative changes causing moderate right foraminal narrowing and moderate left foraminal narrowing.  There is no definite nerve root compression though there is more encroachment upon the right C8 nerve root compression than was present on the previous MRI. 6.   At T1-T2, there are degenerative changes causing borderline spinal stenosis but no nerve root compression. 7.   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 Bilateral peroneal to EDB and tibial motor responses were normal. Bilateral tibial H reflexes were present and symmetric. NEEDLE  ELECTROMYOGRAPHY: Selective needle examinations were performed at bilateral lower extremity muscles bilateral lumbar sacral paraspinal muscles. Bilateral tibialis anterior, peroneal longus, medial gastrocnemius: Normal insertion activity, no spontaneous activity, mildly enlarged simple morphology motor unit potential was mildly decreased recruitment patterns. Bilateral vastus lateralis, biceps femoris long head: Normal insertion activity, no spontaneous activity, normal morphology motor unit potential, with slightly decreased recruitment patterns. There was well-healed surgical scar along the mid line lumbar spine. There is no evidence of active denervation's at bilateral L4-5 S1. IMPRESSION:  This is a mild abnormal study. There is evidence of chronic mild bilateral lumbar sacral radiculopathy, mainly involving bilateral L5-S1 myotomes. There is no evidence of active process. There is no electrodiagnostic evidence of large fiber peripheral neuropathy.  Assessment/Plan:  ELIC VENCILL is a 76 y.o. male who presents for evaluation of right face pain. He has a relevant medical history of HTN, CAD, depression, OSA (not on CPAP), GERD, hypothyroidism, pre-diabetes, cervical spine disease s/p surgery (about 15 years ago, does not remember levels), lumbar spine disease s/p surgery x4, and OA. His neurological examination is pertinent for mild right sided weakness. Available diagnostic data is significant for recent CT head showing old infarct in left basal ganglia and corona radiata.   Patient's face pain  sounds most consistent with trigeminal neuralgia. We discussed work up, including MRI, and blood work to evaluate for other potential causes such as GCA. In terms of treatment, patient stopped gabapentin in the past because he felt he was swelling while on it. However, since stopping some time ago, he has not seen a change in the swelling. He is unsure if he ever took carbamazepine. He would prefer to retry  gabapentin as this could also help neuropathic symptoms from neck and back.  Regarding the apparent stroke on [facility], the most likely cause is HTN given the deep location. This likely explains his right sided weakness. The location of the infarct likely would not explain face symptoms. Patient is already on asa and a statin. I will get MRI as above.  PLAN: -Blood work: ESR, CRP, B12 -MRI brain wo contrast, MRI trigeminal nerve protocol -Continue asa 81 mg, simvastatin for secondary stroke prevention -Gabapentin 300 mg TID for right face pain and neuropathic symptoms -Continue Cymbalta  -Return to clinic in 1 month  The impression above as well as the plan as outlined below were extensively discussed with the patient who voiced understanding. All questions were answered to their satisfaction.  The patient was counseled on pertinent fall precautions per the printed material provided today, and as noted under the "Patient Instructions" section below.  When available, results of the above investigations and possible further recommendations will be communicated to the patient via telephone/MyChart. Patient to call office if not contacted after expected testing turnaround time.   Total time spent reviewing records, interview, history/exam, documentation, and coordination of care on day of encounter:  60 min   Thank you for allowing me to participate in patient's care.  If I can answer any additional questions, I would be pleased to do so.  Kai Levins, MD   CC: [name], MD [place] [id number]  CC: Referring provider: [name], MD [address] [place],  [place] [address]

## 2022-06-02 ENCOUNTER — Encounter: Payer: Self-pay | Admitting: Neurology

## 2022-06-02 ENCOUNTER — Ambulatory Visit: Payer: Medicare Other | Admitting: Neurology

## 2022-06-02 VITALS — BP 114/70 | HR 81 | Ht 70.0 in | Wt 258.0 lb

## 2022-06-02 DIAGNOSIS — E538 Deficiency of other specified B group vitamins: Secondary | ICD-10-CM | POA: Insufficient documentation

## 2022-06-02 DIAGNOSIS — M5412 Radiculopathy, cervical region: Secondary | ICD-10-CM

## 2022-06-02 DIAGNOSIS — R519 Headache, unspecified: Secondary | ICD-10-CM | POA: Diagnosis not present

## 2022-06-02 DIAGNOSIS — G500 Trigeminal neuralgia: Secondary | ICD-10-CM | POA: Diagnosis not present

## 2022-06-02 DIAGNOSIS — I639 Cerebral infarction, unspecified: Secondary | ICD-10-CM | POA: Diagnosis not present

## 2022-06-02 DIAGNOSIS — R531 Weakness: Secondary | ICD-10-CM

## 2022-06-02 DIAGNOSIS — M5417 Radiculopathy, lumbosacral region: Secondary | ICD-10-CM

## 2022-06-02 DIAGNOSIS — R2681 Unsteadiness on feet: Secondary | ICD-10-CM

## 2022-06-02 MED ORDER — GABAPENTIN 300 MG PO CAPS: 300.0000 mg | ORAL_CAPSULE | Freq: Three times a day (TID) | ORAL | 3 refills | Status: DC

## 2022-06-02 MED ORDER — GABAPENTIN 300 MG PO CAPS: 300.0000 mg | ORAL_CAPSULE | Freq: Three times a day (TID) | ORAL | 11 refills | Status: DC

## 2022-06-02 NOTE — Patient Instructions (Signed)
I saw you today for right face pain. Your symptoms sound like trigeminal neuralgia. This can happen because the trigeminal nerve is being pressed on or because of an old stroke.  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  I would like to get an MRI of your brain and that nerve to check things out further.  I would also like to get blood work today.  For your symptoms, I am restarting gabapentin since you have used this in the past. You will take 300 mg three times daily. I sent a prescription to your pharmacy.  I would like to see you back in clinic in 1 month. Please let me know if you have any questions or concerns in the meantime.   The physicians and staff at [facility] are committed to providing excellent care. You may receive a survey requesting feedback about your experience at our office. We strive to receive "very good" responses to the survey questions. If you feel that your experience would prevent you from giving the office a "very good " response, please contact our office to try to remedy the situation. We may be reached at [phone]. Thank you for taking the time out of your busy day to complete the survey.  Kai Levins, MD [facility]  Preventing Falls at [facility] are common, often dreaded events in the lives of older people. Aside from the obvious injuries and even death that may result, fall can cause wide-ranging consequences including loss of independence, mental decline, decreased activity and mobility. Younger people are also at risk of falling, especially those with chronic illnesses and fatigue.  Ways to reduce risk for falling Examine diet and medications. Warm foods and alcohol dilate blood vessels, which can lead to dizziness when standing. Sleep aids, antidepressants and pain medications can also increase the likelihood of a  fall.  Get a vision exam. Poor vision, cataracts and glaucoma increase the chances of falling.  Check foot gear. Shoes should fit snugly and have a sturdy, nonskid sole and a broad, low heel  Participate in a physician-approved exercise program to build and maintain muscle strength and improve balance and coordination. Programs that use ankle weights or stretch bands are excellent for muscle-strengthening. Water aerobics programs and low-impact Tai Chi programs have also been shown to improve balance and coordination.  Increase vitamin D intake. Vitamin D improves muscle strength and increases the amount of calcium the body is able to absorb and deposit in bones.  How to prevent falls from common hazards Floors - Remove all loose wires, cords, and throw rugs. Minimize clutter. Make sure rugs are anchored and smooth. Keep furniture in its usual place.  Chairs -- Use chairs with straight backs, armrests and firm seats. Add firm cushions to existing pieces to add height.  Bathroom - Install grab bars and non-skid tape in the tub or shower. Use a bathtub transfer bench or a shower chair with a back support Use an elevated toilet seat and/or safety rails to assist standing from a low surface. Do not use towel racks or bathroom tissue holders to help you stand.  Lighting - Make sure halls, stairways, and entrances are well-lit. Install a night light in your bathroom or hallway. Make sure there is a light switch at the top and bottom of the staircase. Turn lights on if  you get up in the middle of the night. Make sure lamps or light switches are within reach of the bed if you have to get up during the night.  Kitchen - Install non-skid rubber mats near the sink and stove. Clean spills immediately. Store frequently used utensils, pots, pans between waist and eye level. This helps prevent reaching and bending. Sit when getting things out of lower cupboards.  Living room/ Bedrooms - Place furniture with  wide spaces in between, giving enough room to move around. Establish a route through the living room that gives you something to hold onto as you walk.  Stairs - Make sure treads, rails, and rugs are secure. Install a rail on both sides of the stairs. If stairs are a threat, it might be helpful to arrange most of your activities on the lower level to reduce the number of times you must climb the stairs.  Entrances and doorways - Install metal handles on the walls adjacent to the doorknobs of all doors to make it more secure as you travel through the doorway.  Tips for maintaining balance Keep at least one hand free at all times. Try using a backpack or fanny pack to hold things rather than carrying them in your hands. Never carry objects in both hands when walking as this interferes with keeping your balance.  Attempt to swing both arms from front to back while walking. This might require a conscious effort if Parkinson's disease has diminished your movement. It will, however, help you to maintain balance and posture, and reduce fatigue.  Consciously lift your feet off of the ground when walking. Shuffling and dragging of the feet is a common culprit in losing your balance.  When trying to navigate turns, use a "U" technique of facing forward and making a wide turn, rather than pivoting sharply.  Try to stand with your feet shoulder-length apart. When your feet are close together for any length of time, you increase your risk of losing your balance and falling.  Do one thing at a time. Don't try to walk and accomplish another task, such as reading or looking around. The decrease in your automatic reflexes complicates motor function, so the less distraction, the better.  Do not wear rubber or gripping soled shoes, they might "catch" on the floor and cause tripping.  Move slowly when changing positions. Use deliberate, concentrated movements and, if needed, use a grab bar or walking aid. Count 15  seconds between each movement. For example, when rising from a seated position, wait 15 seconds after standing to begin walking.  If balance is a continuous problem, you might want to consider a walking aid such as a cane, walking stick, or walker. Once you've mastered walking with help, you might be ready to try it on your own again.

## 2022-06-07 ENCOUNTER — Other Ambulatory Visit (INDEPENDENT_AMBULATORY_CARE_PROVIDER_SITE_OTHER): Payer: Medicare Other

## 2022-06-07 DIAGNOSIS — R2681 Unsteadiness on feet: Secondary | ICD-10-CM

## 2022-06-07 DIAGNOSIS — M5412 Radiculopathy, cervical region: Secondary | ICD-10-CM

## 2022-06-07 DIAGNOSIS — I639 Cerebral infarction, unspecified: Secondary | ICD-10-CM

## 2022-06-07 DIAGNOSIS — G500 Trigeminal neuralgia: Secondary | ICD-10-CM

## 2022-06-07 DIAGNOSIS — R519 Headache, unspecified: Secondary | ICD-10-CM

## 2022-06-07 DIAGNOSIS — M5417 Radiculopathy, lumbosacral region: Secondary | ICD-10-CM

## 2022-06-07 DIAGNOSIS — R202 Paresthesia of skin: Secondary | ICD-10-CM

## 2022-06-07 DIAGNOSIS — R531 Weakness: Secondary | ICD-10-CM

## 2022-06-07 LAB — C-REACTIVE PROTEIN: CRP: <1 mg/dL (ref 0.5–20.0)

## 2022-06-07 LAB — VITAMIN B12: Vitamin B-12: 531 pg/mL (ref 211–911)

## 2022-06-07 LAB — SEDIMENTATION RATE: Sed Rate: 42 mm/hr — ABNORMAL HIGH (ref 0–20)

## 2022-06-13 ENCOUNTER — Other Ambulatory Visit: Payer: Self-pay | Admitting: Internal Medicine

## 2022-06-13 DIAGNOSIS — E039 Hypothyroidism, unspecified: Secondary | ICD-10-CM

## 2022-06-13 DIAGNOSIS — K2210 Ulcer of esophagus without bleeding: Secondary | ICD-10-CM

## 2022-06-13 DIAGNOSIS — E785 Hyperlipidemia, unspecified: Secondary | ICD-10-CM

## 2022-06-13 DIAGNOSIS — I77810 Thoracic aortic ectasia: Secondary | ICD-10-CM

## 2022-06-15 ENCOUNTER — Ambulatory Visit: Payer: Medicare Other | Admitting: Podiatry

## 2022-06-15 DIAGNOSIS — L853 Xerosis cutis: Secondary | ICD-10-CM

## 2022-06-15 DIAGNOSIS — M79674 Pain in right toe(s): Secondary | ICD-10-CM

## 2022-06-15 DIAGNOSIS — I739 Peripheral vascular disease, unspecified: Secondary | ICD-10-CM

## 2022-06-15 DIAGNOSIS — L539 Erythematous condition, unspecified: Secondary | ICD-10-CM

## 2022-06-15 DIAGNOSIS — B351 Tinea unguium: Secondary | ICD-10-CM | POA: Diagnosis not present

## 2022-06-15 DIAGNOSIS — M79675 Pain in left toe(s): Secondary | ICD-10-CM | POA: Diagnosis not present

## 2022-06-16 MED ORDER — AMMONIUM LACTATE 12 % EX CREA: 1.0000 | TOPICAL_CREAM | CUTANEOUS | 0 refills | Status: DC | PRN

## 2022-06-16 NOTE — Progress Notes (Signed)
  Subjective:  Patient ID: [name] [name], male    DOB: 02-03-1946,  MRN: [id number]  Chief Complaint  Patient presents with   Nail Problem    non diabetic nail trim and bottom of feet are sore    76 y.o. male presents with the above complaint. History confirmed with patient.  The right foot turns red very easily.  Does not particularly hurt when this happens.  His skin is also very dry and swelling as well.  Objective:  Physical Exam: warm, good capillary refill, dependent rubor present, no trophic changes or ulcerative lesions, normal sensory exam, and +1 DP PT pulse. Left Foot: dystrophic yellowed discolored nail plates with subungual debris Right Foot: dystrophic yellowed discolored nail plates with subungual debris  Assessment:   1. Pain due to onychomycosis of toenails of both feet   2. Dependent rubor   3. PVD (peripheral vascular disease) ([place])   4. Xerosis cutis      Plan:  Patient was evaluated and treated and all questions answered.  Discussed the etiology and treatment options for the condition in detail with the patient. Educated patient on the topical and oral treatment options for mycotic nails. Recommended debridement of the nails today. Sharp and mechanical debridement performed of all painful and mycotic nails today. Nails debrided in length and thickness using a nail nipper to level of comfort. Discussed treatment options including appropriate shoe gear. Follow up as needed for painful nails.   Discussed daily footcare and moisturization ammonium lactate cream was sent to pharmacy for xerosis cutis  We also discussed the redness which I suspect is likely due to rubor he has some component of PAD on clinical exam.  I sent him for an ABI pending results of this we will consider referral to vascular surgery  No follow-ups on file.

## 2022-06-25 ENCOUNTER — Ambulatory Visit (HOSPITAL_COMMUNITY)
Admission: RE | Admit: 2022-06-25 | Discharge: 2022-06-25 | Disposition: A | Discharge status: Home / Self Care | Payer: Medicare Other | Source: Ambulatory Visit | Attending: Podiatry | Admitting: Podiatry

## 2022-06-25 DIAGNOSIS — L539 Erythematous condition, unspecified: Secondary | ICD-10-CM | POA: Insufficient documentation

## 2022-06-25 DIAGNOSIS — I739 Peripheral vascular disease, unspecified: Secondary | ICD-10-CM | POA: Diagnosis present

## 2022-06-29 ENCOUNTER — Ambulatory Visit
Admission: RE | Admit: 2022-06-29 | Discharge: 2022-06-29 | Disposition: A | Discharge status: Home / Self Care | Payer: Medicare Other | Source: Ambulatory Visit | Attending: Neurology | Admitting: Neurology

## 2022-06-29 DIAGNOSIS — R2681 Unsteadiness on feet: Secondary | ICD-10-CM

## 2022-06-29 DIAGNOSIS — I639 Cerebral infarction, unspecified: Secondary | ICD-10-CM

## 2022-06-29 DIAGNOSIS — M5412 Radiculopathy, cervical region: Secondary | ICD-10-CM

## 2022-06-29 DIAGNOSIS — R531 Weakness: Secondary | ICD-10-CM

## 2022-06-29 DIAGNOSIS — M5417 Radiculopathy, lumbosacral region: Secondary | ICD-10-CM

## 2022-06-29 DIAGNOSIS — R519 Headache, unspecified: Secondary | ICD-10-CM

## 2022-06-29 DIAGNOSIS — G500 Trigeminal neuralgia: Secondary | ICD-10-CM

## 2022-07-02 NOTE — Progress Notes (Signed)
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 I recommend he see neurosurgery to discuss a possible surgery that would help symptoms. If he is agreeable, can we order the NSGY consult?  Thank you,  Kai Levins, MD

## 2022-07-08 ENCOUNTER — Other Ambulatory Visit: Payer: Self-pay

## 2022-07-08 DIAGNOSIS — R531 Weakness: Secondary | ICD-10-CM

## 2022-07-08 DIAGNOSIS — G500 Trigeminal neuralgia: Secondary | ICD-10-CM

## 2022-07-08 DIAGNOSIS — R519 Headache, unspecified: Secondary | ICD-10-CM

## 2022-07-08 NOTE — Progress Notes (Deleted)
NEUROLOGY FOLLOW UP OFFICE NOTE  PENIEL ANDELIN [id number]  Subjective:  JEFFERY MCPHETERS is a 77 y.o. year old right-handed male with a medical history of HTN, CAD, depression, OSA (not on CPAP), GERD, hypothyroidism, pre-diabetes, cervical spine disease s/p surgery (about 15 years ago, does not remember levels), lumbar spine disease s/p surgery x4, and OA who we last saw on 06/02/22.  To briefly review: Patient's right facial pain started about 3-4 months ago. Patient states it was initially in right forehead, cheek, jaw, and neck. It is now just in right jaw. If he coughs, he will notice the pain. It feels like a bee sting. It comes and goes quickly. It lasts a few seconds. Touching his face does not significantly change symptoms. He has some pain in the temple area of the head like a headache as well.    He denies vision changes. He has the bee sting sensation when chewing but no clear jaw claudication. He lost about 10 lbs over the year but has gained 5 lbs lately and staying steady.   He has popping in his right jaw when opening and closing. He does not have a significant prior history of headaches. He does have chronic neck pain.    Patient was initially put on gabapentin by his back doctor for neck and back pain. He was taking this with Cymbalta and did help some with the neck. He is still on Cymbalta 30 mg daily but not gabapentin. He stopped gabapentin because he thinks he was swelling up. Per notes, patient was later changed to Tegretol 200. Patient does not remember this and does not currently take this.   Patient mentions his entire right side is weak compared to the left. 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 Patient is on asa 81 mg BID and simvastatin daily.   Patient has significant pain in his back and difficulty walking. He walks with a walker. He falls about 2 times per month. He has tried PT in the past (several years  ago), but patient says it does not help.   Of note, patient was seen at [facility] (most recently in 2020) for RUE weakness, felt to be secondary to C5-6 radiculopathy, and gait instability, felt to be due to lumbar spine disease and chronic knee pain.  Most recent Assessment and Plan (06/02/22): Patient's face pain sounds most consistent with trigeminal neuralgia. We discussed work up, including MRI, and blood work to evaluate for other potential causes such as GCA. In terms of treatment, patient stopped gabapentin in the past because he felt he was swelling while on it. However, since stopping some time ago, he has not seen a change in the swelling. He is unsure if he ever took carbamazepine. He would prefer to retry gabapentin as this could also help neuropathic symptoms from neck and back.   Regarding the apparent stroke on [facility], the most likely cause is HTN given the deep location. This likely explains his right sided weakness. The location of the infarct likely would not explain face symptoms. Patient is already on asa and a statin. I will get MRI as above.   PLAN: -Blood work: ESR, CRP, B12 -MRI brain wo contrast, MRI trigeminal nerve protocol -Continue asa 81 mg, simvastatin for secondary stroke prevention -Gabapentin 300 mg TID for right face pain and neuropathic symptoms -Continue Cymbalta  Since their last visit: MRI of brain and face showed that  the right superior cerebellar artery passes inferior to trigeminal nerve with neural contact. Patient was referred to NSGY to discuss options.   Symptoms?*** Vision changes, jaw claudication?***  MEDICATIONS:  Outpatient Encounter Medications as of 07/16/2022  Medication Sig   ammonium lactate (AMLACTIN) 12 % cream Apply 1 Application topically as needed for dry skin.   aspirin EC 81 MG tablet Take 1 tablet (81 mg total) by mouth 2 (two) times daily.   cholecalciferol (VITAMIN D3) 25 MCG (1000 UNIT) tablet Take 2,000 Units by mouth daily.    DULoxetine (CYMBALTA) 30 MG capsule TAKE 1 CAPSULE BY MOUTH DAILY   Fluticasone-Umeclidin-Vilant (TRELEGY ELLIPTA) 100-62.5-25 MCG/ACT AEPB Inhale into the lungs.   furosemide (LASIX) 40 MG tablet TAKE 1 TABLET BY MOUTH DAILY   gabapentin (NEURONTIN) 300 MG capsule Take 1 capsule (300 mg total) by mouth 3 (three) times daily.   levothyroxine (SYNTHROID) 125 MCG tablet TAKE 1 TABLET BY MOUTH DAILY   losartan-hydrochlorothiazide (HYZAAR) 100-25 MG tablet TAKE ONE-HALF TABLET BY  MOUTH DAILY   metoprolol succinate (TOPROL-XL) 25 MG 24 hr tablet Take 1 tablet (25 mg total) by mouth daily.   Multiple Vitamins-Minerals (ICAPS AREDS 2 PO) Take 1 capsule by mouth daily.   Omega-3 Fatty Acids (SUPER OMEGA-3) 1000 MG CAPS Take by mouth. (Patient not taking: Reported on 06/02/2022)   omeprazole (PRILOSEC) 40 MG capsule TAKE 1 CAPSULE BY MOUTH TWICE  DAILY   polyethylene glycol (MIRALAX / GLYCOLAX) 17 g packet Take 17 g by mouth daily.   potassium chloride SA (KLOR-CON M) 20 MEQ tablet TAKE 1 TABLET BY MOUTH DAILY   simvastatin (ZOCOR) 20 MG tablet TAKE 1 TABLET BY MOUTH DAILY AT  6 PM.   No facility-administered encounter medications on file as of 07/16/2022.    PAST MEDICAL HISTORY: Past Medical History:  Diagnosis Date   Arthritis    CAD (coronary artery disease)    cath 3/10: mLAD 30%, pRCA 30%, EF 55-60% Dr. Harrington Challenger cardiologist  most recent  stress stress done ~ 2 years ago with Dr. Harrington Challenger   Complication of anesthesia    "he usually gets an ileus after back OR"    Depression    Dyspnea    -PFTs compeltely normal 03/20/08 including DLc0   Environmental allergies    Dust, Smoke   GERD (gastroesophageal reflux disease)    HTN (hypertension)    Hypothyroidism    Low testosterone    Observed sleep apnea    can't wear cpap   Polycythemia    PONV (postoperative nausea and vomiting)    Pulmonary nodule 06/13/2012   Rosacea    Sleep apnea    Most recent sleep study 2010; records at [facility]  office    PAST SURGICAL HISTORY: Past Surgical History:  Procedure Laterality Date   BACK SURGERY  2005; 06/2006; 01/2007; 04/2009   CARDIAC CATHETERIZATION  08/2008   CATARACT EXTRACTION, BILATERAL  2010   CERVICAL FUSION  04/2002   ?C3-4   COLONOSCOPY  12/2005   normal screening study.    ESOPHAGOGASTRODUODENOSCOPY  multiple   last 07/2012 GERD esophagitis, 48 Fr dilation   ESOPHAGOGASTRODUODENOSCOPY N/A 09/19/2015   Procedure: ESOPHAGOGASTRODUODENOSCOPY (EGD);  Surgeon: [name], MD;  Location: [facility] ENDOSCOPY;  Service: Endoscopy;  Laterality: N/A;   KNEE ARTHROSCOPY WITH MEDIAL MENISECTOMY Right 05/14/2015   Procedure: RIGHT KNEE ARTHROSCOPY CHONDROPLASTY, PARTIAL  MEDIAL MENISECTOMY;  Surgeon: [name], MD;  Location: [place];  Service: Orthopedics;  Laterality: Right;  left knee replacement     left shoudler clean     LUMBAR DISC SURGERY  06/2006   L3   MENISCUS REPAIR  08/2008   left   NECK SURGERY     POSTERIOR FUSION LUMBAR SPINE  2005; 01/2007; 04/2009   L5; L3-4; L2-3   PROSTATE SURGERY  2000   SAVORY DILATION N/A 09/19/2015   Procedure: SAVORY DILATION;  Surgeon: [name], MD;  Location: [facility] ENDOSCOPY;  Service: Endoscopy;  Laterality: N/A;   SHOULDER OPEN ROTATOR CUFF REPAIR Left 1999   left   TONSILLECTOMY  09/21/11   TOTAL KNEE ARTHROPLASTY Left 01/2011   left   TOTAL KNEE ARTHROPLASTY Right 08/08/2015   Procedure: RIGHT TOTAL KNEE ARTHROPLASTY;  Surgeon: [name], MD;  Location: [place];  Service: Orthopedics;  Laterality: Right;   TRANSURETHRAL RESECTION OF PROSTATE  2006   followed by "surgery to get rid of clots"   UVULOPALATOPHARYNGOPLASTY, TONSILLECTOMY AND SEPTOPLASTY  09/21/11   Deviated Septum    ALLERGIES: Allergies  Allergen Reactions   Penicillins Hives and Other (See Comments)    "I break out in welts" Has patient had a PCN reaction causing immediate rash, facial/tongue/throat swelling, SOB or lightheadedness with  hypotension: No Has patient had a PCN reaction causing severe rash involving mucus membranes or skin necrosis: No Has patient had a PCN reaction that required hospitalization No Has patient had a PCN reaction occurring within the last 10 years: No If all of the above answers are "NO", then may proceed with Cephalosporin use.   Codeine Nausea And Vomiting   Sulfonamide Derivatives Nausea And Vomiting    FAMILY HISTORY: Family History  Problem Relation Age of Onset   Breast cancer Mother        dx. mid-70s   Colon polyps Mother        "44+ colon polyps per yr"   Diabetes Mother    Breast cancer Sister 72   Other Sister        + MUTYH mutation   Diabetes Sister        borderline   Heart Problems Sister    Breast cancer Sister        dx. 34s   Heart attack Father    Alzheimer's disease Father    Lung cancer Paternal Uncle        x4   Throat cancer Maternal Uncle        mother's maternal half-brother; dx 28s; +EtOH   Liver cancer Maternal Uncle        mother's maternal half-brother; either liver cancer or cirrhosis   Cirrhosis Maternal Uncle        +EtOH   Stroke Paternal Grandmother    Heart attack Paternal Grandfather    Other Daughter        negative genetic testing in 2017   Parkinson's disease Other    Bipolar disorder Other    Lung cancer Paternal Uncle        (x3)   Lung cancer Paternal Uncle    Alzheimer's disease Cousin        paternal 1st cousin, d. 41   Heart attack Cousin    Anesthesia problems Neg Hx     SOCIAL HISTORY: Social History   Tobacco Use   Smoking status: Former    Packs/day: 1.00    Years: 16.00    Total pack years: 16.00    Types: Cigarettes    Quit date: 07/17/1974    Years since  quitting: 48.0   Smokeless tobacco: Former  Scientific laboratory technician Use: Never used  Substance Use Topics   Alcohol use: No   Drug use: No   Social History   Social History Narrative   Area, 3 children.Retired from the city of [employer] caffeinated  beverages daily   Home is one story with wife      Objective:  Vital Signs:  There were no vitals taken for this visit.  ***  Labs and Imaging review: New results: 06/07/22: ESR: 42 (mildly elevated) CRP: < 1.0 B12: 531  MRI face trigeminal nerve protocol (06/29/22): FINDINGS: Limited intracranial/Trigeminal nerves: No evidence of mass or inflammation by noncontrast technique. Normal appearance of the brainstem. The right superior cerebellar artery does pass inferior to the root entry zone with neural contact.   Vascular: Vertebral and basilar tortuosity, eccentric to the right but no trigeminal nerve mass effect from these vessels. Superior cerebellar artery described above.   Sinuses/Orbits: Negative   Soft tissues: No evidence of soft tissue mass or atrophy involving the muscles of mastication.   Osseous: No focal marrow lesion.   IMPRESSION: 1. No mass or inflammation by noncontrast technique. 2. Contact between the right superior cerebellar artery and the right trigeminal root entry zone.  MRI brain wo contrast (06/29/22): FINDINGS: Brain: No acute infarction, hemorrhage, hydrocephalus, extra-axial collection or mass lesion. 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 Mild cerebral volume loss for age.   Vascular: Major flow voids are preserved   Skull and upper cervical spine: C2-3 and C3-4 fusion. No focal marrow lesion   Sinuses/Orbits: No explanation for headache. Bilateral cataract resection.   IMPRESSION: 1. No specific cause for headache. 2. Remote perforator infarct at the left basal ganglia.  Previously reviewed results: Recent Labs       Lab Results  Component Value Date    HGBA1C 6.1 03/17/2022      Recent Labs       Lab Results  Component Value Date    [id number] 01/27/2016      Recent Labs       Lab Results  Component Value Date    TSH 2.00 03/17/2022      Recent  Labs[]$ Expand by Default       Lab Results  Component Value Date    ESRSEDRATE 12 01/27/2016      Vit D (08/26/21): 43.8 CBC, BMP, LFTs: wnl   Imaging: CT head wo contrast (12/29/21): FINDINGS: Brain: No acute intracranial hemorrhage, midline shift or mass effect. No extra-axial fluid collection. Generalized atrophy is noted. Periventricular white matter hypodensities are noted bilaterally. No hydrocephalus. 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   Vascular: Atherosclerotic calcification of the carotid siphons and vertebral arteries. No hyperdense vessels.   Skull: Normal. Negative for fracture or focal lesion.   Sinuses/Orbits: There is partial opacification of the ethmoid air cells on the right. No acute orbital abnormality.   Other: None.   IMPRESSION: 1. No acute intracranial process. 2. Atrophy with chronic microvascular ischemic changes. 3. 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   MRI thoracic spine wo contrast (05/02/2018): IMPRESSION: 1. Mild multilevel spondylosis of the thoracic spine without focal compressive stenosis.   MRI brain w/wo contrast (01/11/2019): FINDINGS: Brain: Diffusion imaging does not show any acute or subacute infarction. Brainstem and cerebellum are normal. Cerebral hemispheres show mild age  related volume loss with mild chronic small-vessel change of the deep white matter, less than often seen at this age. No cortical or large vessel territory infarction. No mass lesion, hemorrhage, hydrocephalus or extra-axial collection after contrast administration, no abnormal enhancement occurs.   Vascular: Major vessels at the base of the brain show flow.   Skull and upper cervical spine: Negative   Sinuses/Orbits: Clear/normal.  Previous lens implants.   Other: None   IMPRESSION: No cause of the presenting symptoms is identified. Essentially normal study for age with mild volume  loss and minimal small vessel change of the hemispheric white matter.   MRI cervical spine wo contrast (02/02/2019): FINDINGS: :  On sagittal images, the spine is imaged from above the cervicomedullary junction to T2.   The spinal cord is of normal caliber.  There is T2 hyperintense signal within the cord adjacent to C3-C4 consistent with chronic mild low malacia, stable compared to the previous MRI.[name]  There is a solid fusion at C3-C4 associated with ACDF hardware.  Reduced disc height is noted at C4-C5, C6-C7 and C7-T1.  The vertebral bodies are normally aligned.   The vertebral bodies have normal signal.     The discs and interspaces were further evaluated on axial views from C2 to T1 as follows:   C2-C3: There is a small right disc osteophyte complex causing mild right foraminal narrowing but no nerve root compression or spinal stenosis.   C3-C4: This level is fused.  There is a right paramedian disc osteophyte complex and right facet hypertrophy causing moderate right foraminal narrowing.  There is also mild left foraminal narrowing.  No nerve root compression or spinal stenosis.   C4-C5: There are disc osteophyte complexes and facet hypertrophy.  This causes mild spinal stenosis and moderate bilateral foraminal narrowing.   C5-C6: There are disc osteophyte complexes, right greater than left causing mild spinal stenosis, severe right foraminal narrowing and moderate left foraminal narrowing.   C6-C7: There is disc protrusion and uncovertebral spurring and ligamenta flava hypertrophy causing mild spinal stenosis and moderate bilateral foraminal narrowing.   C7-T1: There is a right disc osteophyte complex, ligamenta flava hypertrophy and left greater than right facet hypertrophy.  There is no spinal stenosis but there is progressed moderate right and stable mild to moderate left foraminal narrowing.   T1-T2: There is disc protrusion and ligamenta flava hypertrophy causing borderline spinal  stenosis and mild to moderate foraminal narrowing.   When compared to the MRI dated 02/22/2018, changes at C7-T1 have progressed with more foraminal narrowing to the right on the current study.   IMPRESSION: This MRI of the cervical spine without contrast shows the following: 1.   At C3-C4, there is stable ACDF.  There is moderate right foraminal narrowing but no definite nerve root compression or spinal stenosis. 2.   At C4-C5, there are stable degenerative changes causing stable mild spinal stenosis and moderate foraminal narrowing but no definite nerve root compression.. 3.   At C5-C6, there are stable degenerative changes causing mild spinal stenosis and severe right foraminal narrowing with potential for right C6 nerve root compression 4.   At C6-C7, there are stable degenerative changes causing mild spinal stenosis and moderate foraminal narrowing but no definite nerve root compression. 5.   At C7-T1, there are progressive degenerative changes causing moderate right foraminal narrowing and moderate left foraminal narrowing.  There is no definite nerve root compression though there is more encroachment upon the right C8 nerve root compression than  was present on the previous MRI. 6.   At T1-T2, there are degenerative changes causing borderline spinal stenosis but no nerve root compression. 7.   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 Bilateral peroneal to EDB and tibial motor responses were normal. Bilateral tibial H reflexes were present and symmetric. NEEDLE ELECTROMYOGRAPHY: Selective needle examinations were performed at bilateral lower extremity muscles bilateral lumbar sacral paraspinal muscles. Bilateral tibialis anterior, peroneal longus, medial gastrocnemius: Normal insertion activity, no spontaneous activity, mildly enlarged simple  morphology motor unit potential was mildly decreased recruitment patterns. Bilateral vastus lateralis, biceps femoris long head: Normal insertion activity, no spontaneous activity, normal morphology motor unit potential, with slightly decreased recruitment patterns. There was well-healed surgical scar along the mid line lumbar spine. There is no evidence of active denervation's at bilateral L4-5 S1. IMPRESSION:  This is a mild abnormal study. There is evidence of chronic mild bilateral lumbar sacral radiculopathy, mainly involving bilateral L5-S1 myotomes. There is no evidence of active process. There is no electrodiagnostic evidence of large fiber peripheral neuropathy.  Assessment/Plan:  This is [name], a 77 y.o. male with: ***   Plan: ***  Return to clinic in ***  Total time spent reviewing records, interview, history/exam, documentation, and coordination of care on day of encounter:  *** min  Kai Levins, MD

## 2022-07-13 ENCOUNTER — Other Ambulatory Visit: Payer: Self-pay | Admitting: Neurological Surgery

## 2022-07-13 DIAGNOSIS — M48061 Spinal stenosis, lumbar region without neurogenic claudication: Secondary | ICD-10-CM

## 2022-07-13 DIAGNOSIS — M4712 Other spondylosis with myelopathy, cervical region: Secondary | ICD-10-CM

## 2022-07-16 ENCOUNTER — Ambulatory Visit: Payer: Medicare Other | Admitting: Neurology

## 2022-07-31 ENCOUNTER — Ambulatory Visit
Admission: RE | Admit: 2022-07-31 | Discharge: 2022-07-31 | Disposition: A | Discharge status: Home / Self Care | Payer: Medicare Other | Source: Ambulatory Visit | Attending: Neurological Surgery | Admitting: Neurological Surgery

## 2022-07-31 DIAGNOSIS — M4712 Other spondylosis with myelopathy, cervical region: Secondary | ICD-10-CM

## 2022-07-31 DIAGNOSIS — M48061 Spinal stenosis, lumbar region without neurogenic claudication: Secondary | ICD-10-CM

## 2022-07-31 MED ORDER — GADOPICLENOL 0.5 MMOL/ML IV SOLN
10.0000 mL | Freq: Once | INTRAVENOUS | Status: AC | PRN
  Administered 2022-07-31: 10 mL via INTRAVENOUS

## 2022-08-11 ENCOUNTER — Telehealth: Payer: Self-pay

## 2022-08-11 ENCOUNTER — Telehealth: Payer: Self-pay | Admitting: Internal Medicine

## 2022-08-11 NOTE — Telephone Encounter (Signed)
For our records:  We have received pre-pp pw from [facility] and it has been placed in Dr. [name]' boxes.   Upon completion please fax to: [phone]

## 2022-08-11 NOTE — Telephone Encounter (Signed)
   Name: [name] [name]  DOB: May 20, 1946  MRN: [id number]  Primary Cardiologist: [name], MD  Chart reviewed as part of pre-operative protocol coverage. The patient has an upcoming visit scheduled with Dr. Harrington Challenger on 08/13/2022 at which time clearance can be addressed in case there are any issues that would impact surgical recommendations.  Per office protocol the cardiology provider should forward their finalized clearance decision and recommendations regarding antiplatelet therapy to the requesting party below.    I will route this message as FYI to requesting party and remove this message from the preop box as separate preop APP input not needed at this time.   Please call with any questions.  Mable Fill, Marissa Nestle, NP  08/11/2022, 3:13 PM

## 2022-08-11 NOTE — Telephone Encounter (Signed)
..     Pre-operative Risk Assessment    Patient Name: [name] [name]  DOB: 1945-09-28 MRN: [id number]      Request for Surgical Clearance    Procedure:   TRIGEMINAL NERVE DECOMPRESSION  Date of Surgery:  Clearance TBD                                 Surgeon:  DR Kristeen Miss Surgeon's Group or Practice Name:  [facility] Phone number:  [phone] Fax number:  [phone]   Type of Clearance Requested:   - Medical  - Pharmacy:  Hold Aspirin     Type of Anesthesia:  General    Additional requests/questions:    [name]   08/11/2022, 1:43 PM

## 2022-08-12 NOTE — Telephone Encounter (Signed)
MD completed form was faxed back to [facility] @ [phone]...[name] [name]

## 2022-08-12 NOTE — Progress Notes (Signed)
Cardiology Office Note   Date:  08/13/2022   ID:  ZYEN ACKER, DOB 1945-07-10, MRN [id number]  PCP:  [name], MD  Cardiologist:   [name], MD   F/U of CAD     History of Present Illness: [name] [name] is a 77 y.o. 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   Improved with lasix     Since seen he says he still gets SOB with adtiviity   Denies CP  Does get occasional dizziness  No syncope     Edema in legs is up/down     I saw the pt in clinic in Feb 2023    Since seen he denies CP  Breathing is OK   No dizziness    Says he is drinking fluids   Current Meds  Medication Sig   aspirin EC 81 MG tablet Take 1 tablet (81 mg total) by mouth 2 (two) times daily.   cholecalciferol (VITAMIN D3) 25 MCG (1000 UNIT) tablet Take 2,000 Units by mouth daily.   DULoxetine (CYMBALTA) 30 MG capsule TAKE 1 CAPSULE BY MOUTH DAILY   Fluticasone-Umeclidin-Vilant (TRELEGY ELLIPTA) 100-62.5-25 MCG/ACT AEPB Inhale into the lungs.   furosemide (LASIX) 40 MG tablet TAKE 1 TABLET BY MOUTH DAILY   gabapentin (NEURONTIN) 300 MG capsule Take 1 capsule (300 mg total) by mouth 3 (three) times daily.   levothyroxine (SYNTHROID) 125 MCG tablet TAKE 1 TABLET BY MOUTH DAILY   losartan-hydrochlorothiazide (HYZAAR) 100-25 MG tablet TAKE ONE-HALF TABLET BY  MOUTH DAILY   metoprolol succinate (TOPROL-XL) 25 MG 24 hr tablet Take 1 tablet (25 mg total) by mouth daily.   Multiple Vitamins-Minerals (ICAPS AREDS 2 PO) Take 1 capsule by mouth daily.   Omega-3 Fatty Acids (SUPER OMEGA-3) 1000 MG CAPS Take by mouth.   omeprazole (PRILOSEC) 40 MG capsule TAKE 1 CAPSULE BY MOUTH TWICE  DAILY   polyethylene glycol (MIRALAX / GLYCOLAX) 17 g packet Take 17 g by mouth daily.   potassium chloride SA (KLOR-CON M) 20 MEQ tablet TAKE 1  TABLET BY MOUTH DAILY   simvastatin (ZOCOR) 20 MG tablet TAKE 1 TABLET BY MOUTH DAILY AT  6 PM.     Allergies:   Penicillins, Codeine, and Sulfonamide derivatives   Past Medical History:  Diagnosis Date   Arthritis    CAD (coronary artery disease)    cath 3/10: mLAD 30%, pRCA 30%, EF 55-60% Dr. Harrington Challenger cardiologist  most recent  stress stress done ~ 2 years ago with Dr. Harrington Challenger   Complication of anesthesia    "he usually gets an ileus after back OR"    Depression    Dyspnea    -PFTs compeltely normal 03/20/08 including DLc0   Environmental allergies    Dust, Smoke   GERD (gastroesophageal reflux disease)    HTN (hypertension)    Hypothyroidism    Low testosterone    Observed sleep apnea    can't wear cpap   Polycythemia    PONV (postoperative nausea and vomiting)    Pulmonary nodule 06/13/2012   Rosacea    Sleep apnea    Most recent sleep study 2010; [facility]    Past Surgical History:  Procedure Laterality Date   BACK SURGERY  2005; 06/2006; 01/2007; 04/2009   CARDIAC CATHETERIZATION  08/2008   CATARACT EXTRACTION, BILATERAL  2010   CERVICAL FUSION  04/2002   ?C3-4   COLONOSCOPY  12/2005   normal screening study.    ESOPHAGOGASTRODUODENOSCOPY  multiple   last 07/2012 GERD esophagitis, 48 Fr dilation   ESOPHAGOGASTRODUODENOSCOPY N/A 09/19/2015   Procedure: ESOPHAGOGASTRODUODENOSCOPY (EGD);  Surgeon: [name], MD;  Location: [facility] ENDOSCOPY;  Service: Endoscopy;  Laterality: N/A;   KNEE ARTHROSCOPY WITH MEDIAL MENISECTOMY Right 05/14/2015   Procedure: RIGHT KNEE ARTHROSCOPY CHONDROPLASTY, PARTIAL  MEDIAL MENISECTOMY;  Surgeon: [name], MD;  Location: [place];  Service: Orthopedics;  Laterality: Right;   left knee replacement     left shoudler clean     LUMBAR DISC SURGERY  06/2006   L3   MENISCUS REPAIR  08/2008   left   NECK SURGERY     POSTERIOR FUSION LUMBAR SPINE  2005; 01/2007; 04/2009   L5; L3-4; L2-3   PROSTATE SURGERY   2000   SAVORY DILATION N/A 09/19/2015   Procedure: SAVORY DILATION;  Surgeon: [name], MD;  Location: [facility] ENDOSCOPY;  Service: Endoscopy;  Laterality: N/A;   SHOULDER OPEN ROTATOR CUFF REPAIR Left 1999   left   TONSILLECTOMY  09/21/11   TOTAL KNEE ARTHROPLASTY Left 01/2011   left   TOTAL KNEE ARTHROPLASTY Right 08/08/2015   Procedure: RIGHT TOTAL KNEE ARTHROPLASTY;  Surgeon: [name], MD;  Location: [place];  Service: Orthopedics;  Laterality: Right;   TRANSURETHRAL RESECTION OF PROSTATE  2006   followed by "surgery to get rid of clots"   UVULOPALATOPHARYNGOPLASTY, TONSILLECTOMY AND SEPTOPLASTY  09/21/11   Deviated Septum     Social History:  The patient  reports that he quit smoking about 48 years ago. His smoking use included cigarettes. He has a 16.00 pack-year smoking history. He has quit using smokeless tobacco. He reports that he does not drink alcohol and does not use drugs.   Family History:  The patient's family history includes Alzheimer's disease in his cousin and father; Bipolar disorder in an other family member; Breast cancer in his mother and sister; Breast cancer (age of onset: 70) in his sister; Cirrhosis in his maternal uncle; Colon polyps in his mother; Diabetes in his mother and sister; Heart Problems in his sister; Heart attack in his cousin, father, and paternal grandfather; Liver cancer in his maternal uncle; Lung cancer in his paternal uncle, paternal uncle, and paternal uncle; Other in his daughter and sister; Parkinson's disease in an other family member; Stroke in his paternal grandmother; Throat cancer in his maternal uncle.    ROS:  Please see the history of present illness. All other systems are reviewed and  Negative to the above problem except as noted.    PHYSICAL EXAM: VS:  BP (!) 86/60   Pulse 70   Ht 5' 10"$  (1.778 m)   Wt 259 lb (117.5 kg)   BMI 37.16 kg/m    BP 90/60 on my check     GEN: Morbidly obese 77 yo in no acute distress   Examined  in chair HEENT: NCAT   Neck: JVP is normal   Cardiac: RRR; No Sgnif murmurs   Tr to 1+ LE edema with mild chronic skin changes  Respiratory: mild rhonchi   GI: Obese.  Distended.  Nontender. MS: No deformity     Skin: warm and dry, Feet calves are minimally erythematous Neuro:  CN II to XII intact   Psych: euthymic mood, full affect   EKG:  EKG shows SR  First degree AV block  PR 220   RBBB   LAFB  LVH      Component Value Date/Time   CHOL 107 08/26/2021 0826   TRIG 97 08/26/2021 0826   HDL 32 (L) 08/26/2021 0826   CHOLHDL 3.3 08/26/2021 0826   CHOLHDL 3 09/22/2020 1151   VLDL 24.4 09/22/2020 1151   LDLCALC 56 08/26/2021 0826      Wt Readings from Last 3 Encounters:  08/13/22 259 lb (117.5 kg)  06/02/22 258 lb (117 kg)  04/01/22 256 lb 9.6 oz (116.4 kg)      ASSESSMENT AND PLAN:  1 Blood pressure   BP is low today   He denies dizziness   Will check BMET, cortisol, UA CBC    Hold losartan / HCTZ   Check at home  Bring log   Follow up in 4 wks   2   CAD  No symptoms of angina  Mild dz on CT coronary angiogram in March 2023     3  LE edema  Mild   Follow   Get labs   4    HTN   As noted cut back on meds   [place]   5   Aorta  normal on CT    6  Lipids.  Continue statin LDL 56  HDL 32 in march 2023    Current medicines are reviewed at length with the patient today.  The patient does not have concerns regarding medicines.  Signed, [name], MD  08/13/2022 10:24 AM    [address], [place], [place]  [address] Phone: [phone]; Fax: [phone]

## 2022-08-12 NOTE — Telephone Encounter (Signed)
MD has form from Neurosurgery for completion.Marland KitchenJohny Chess

## 2022-08-13 ENCOUNTER — Ambulatory Visit: Payer: Medicare Other | Attending: Internal Medicine | Admitting: Internal Medicine

## 2022-08-13 ENCOUNTER — Encounter: Payer: Self-pay | Admitting: Internal Medicine

## 2022-08-13 VITALS — BP 86/60 | HR 70 | Ht 70.0 in | Wt 259.0 lb

## 2022-08-13 DIAGNOSIS — Z79899 Other long term (current) drug therapy: Secondary | ICD-10-CM

## 2022-08-13 DIAGNOSIS — I10 Essential (primary) hypertension: Secondary | ICD-10-CM | POA: Diagnosis not present

## 2022-08-13 NOTE — Patient Instructions (Signed)
Medication Instructions:  HOLD LOSARTAN/ HCTZ *If you need a refill on your cardiac medications before your next appointment, please call your pharmacy*   Lab Work: CBC, BMET, CORTISOL, UA  If you have labs (blood work) drawn today and your tests are completely normal, you will receive your results only by: [place] (if you have MyChart) OR A paper copy in the mail If you have any lab test that is abnormal or we need to change your treatment, we will call you to review the results.   Testing/Procedures:    Follow-Up: At [facility], you and your health needs are our priority.  As part of our continuing mission to provide you with exceptional heart care, we have created designated Provider Care Teams.  These Care Teams include your primary Cardiologist (physician) and Advanced Practice Providers (APPs -  Physician Assistants and Nurse Practitioners) who all work together to provide you with the care you need, when you need it.  We recommend signing up for the patient portal called "MyChart".  Sign up information is provided on this After Visit Summary.  MyChart is used to connect with patients for Virtual Visits (Telemedicine).  Patients are able to view lab/test results, encounter notes, upcoming appointments, etc.  Non-urgent messages can be sent to your provider as well.   To learn more about what you can do with MyChart, go to [email or web address].    Your next appointment:   1 month(s)  Provider:   [name], MD     Other Instructions

## 2022-08-14 LAB — URINALYSIS, ROUTINE W REFLEX MICROSCOPIC
Bilirubin, UA: NEGATIVE
Glucose, UA: NEGATIVE
Ketones, UA: NEGATIVE
Leukocytes,UA: NEGATIVE
Nitrite, UA: NEGATIVE
Protein,UA: NEGATIVE
RBC, UA: NEGATIVE
Specific Gravity, UA: 1.01 (ref 1.005–1.030)
Urobilinogen, Ur: 0.2 mg/dL (ref 0.2–1.0)
pH, UA: 6.5 (ref 5.0–7.5)

## 2022-08-14 LAB — BASIC METABOLIC PANEL
BUN/Creatinine Ratio: 18 (ref 10–24)
BUN: 18 mg/dL (ref 8–27)
CO2: 25 mmol/L (ref 20–29)
Calcium: 9.8 mg/dL (ref 8.6–10.2)
Chloride: 97 mmol/L (ref 96–106)
Creatinine, Ser: 1.02 mg/dL (ref 0.76–1.27)
Glucose: 78 mg/dL (ref 70–99)
Potassium: 4.3 mmol/L (ref 3.5–5.2)
Sodium: 138 mmol/L (ref 134–144)
eGFR: 77 mL/min/{1.73_m2} (ref >59)

## 2022-08-14 LAB — CORTISOL: Cortisol: 12.5 ug/dL (ref 6.2–19.4)

## 2022-08-14 LAB — CBC
Hematocrit: 46.6 % (ref 37.5–51.0)
Hemoglobin: 15.4 g/dL (ref 13.0–17.7)
MCH: 30.7 pg (ref 26.6–33.0)
MCHC: 33 g/dL (ref 31.5–35.7)
MCV: 93 fL (ref 79–97)
Platelets: 238 10*3/uL (ref 150–450)
RBC: 5.01 x10E6/uL (ref 4.14–5.80)
RDW: 12 % (ref 11.6–15.4)
WBC: 7.9 10*3/uL (ref 3.4–10.8)

## 2022-08-17 ENCOUNTER — Other Ambulatory Visit: Payer: Self-pay | Admitting: Internal Medicine

## 2022-08-17 DIAGNOSIS — E039 Hypothyroidism, unspecified: Secondary | ICD-10-CM

## 2022-08-17 DIAGNOSIS — E785 Hyperlipidemia, unspecified: Secondary | ICD-10-CM

## 2022-08-17 DIAGNOSIS — I77810 Thoracic aortic ectasia: Secondary | ICD-10-CM

## 2022-08-17 DIAGNOSIS — K2210 Ulcer of esophagus without bleeding: Secondary | ICD-10-CM

## 2022-09-27 NOTE — Progress Notes (Unsigned)
Office Visit    Patient Name: [name] [name] Date of Encounter: 09/28/2022  PCP:  [name], MD   [place]  Cardiologist:  [name], MD  Advanced Practice Provider:  No care team member to display Electrophysiologist:  None   HPI    [name] [name] is a 77 y.o. 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  Patient was seen in the clinic March 2022.  At that time was still having some edema and it was felt to be due to venous insufficiency.  Improved with Lasix.  He was seen by Dr. Harrington Challenger 2/16 and at that time was still getting short of breath with activity.  Denied chest pain.  Did endorse some occasional dizziness.  No syncope.  He did have some edema in his legs. He was hypotensive at that time and planned to hold some of his medications and follow-up in a few weeks.  Patient was seen February 2023 and at that time denied chest pain, breathing was okay.  No dizziness.  Says he was drinking fluids.  Today, he tells me he is doing okay.  He is having some pain in his face and needs a  "hole drilled into his head".  He is scheduled to have a trigeminal nerve decompression with Dr. Ellene Route.  He has not had any chest pain but he does have chronic shortness of breath.  He has chronic bronchitis and sees Dr. I card.  He was taking trelegy elliptica and this really helped his shortness of breath but he stopped it after his bronchitis infection ended.  However, he is unsure if he should have stopped this medication since it was helping him and he was given 6 refills.  Sounds like he may have missed his follow-up with Dr. I card.  He plans to reach back out to them for guidance.  He tells me that he drags his feet and he has had his knees operated on.  He has had several surgeries that hold him back physically.  He thinks he might have a pinched nerve in  his neck.  He worked until he was 77 years of age and then once he retired he felt like his body fell apart.  His shoulder is the biggest problem and needs replaced.  He is able to walk around his house using a walker but would have trouble walking 1-2 blocks.  He is able to do stairs 1 stair at a time and slowly.  He has barely met the minimum of 4 METS.  Recent echocardiogram reviewed with patient.  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  He tells me he thinks his sleep apnea is gone because his tonsils and adenoids were removed but he still does have some daytime sleepiness and snores per his wife.  He can hold his aspirin for 5 to 7 days prior to surgery and restart when medically safe to do so.  Past Medical History    Past Medical History:  Diagnosis Date   Arthritis    CAD (coronary artery disease)    cath 3/10: mLAD 30%, pRCA 30%, EF 55-60% Dr. Harrington Challenger cardiologist  most recent  stress stress done ~ 2 years ago with Dr. Harrington Challenger   Complication of anesthesia    "  he usually gets an ileus after back OR"    Depression    Dyspnea    -PFTs compeltely normal 03/20/08 including DLc0   Environmental allergies    Dust, Smoke   GERD (gastroesophageal reflux disease)    HTN (hypertension)    Hypothyroidism    Low testosterone    Observed sleep apnea    can't wear cpap   Polycythemia    PONV (postoperative nausea and vomiting)    Pulmonary nodule 06/13/2012   Rosacea    Sleep apnea    Most recent sleep study 2010; records at [facility]   Past Surgical History:  Procedure Laterality Date   BACK SURGERY  2005; 06/2006; 01/2007; 04/2009   CARDIAC CATHETERIZATION  08/2008   CATARACT EXTRACTION, BILATERAL  2010   CERVICAL FUSION  04/2002   ?C3-4   COLONOSCOPY  12/2005   normal screening study.    ESOPHAGOGASTRODUODENOSCOPY  multiple   last 07/2012 GERD esophagitis, 48 Fr dilation   ESOPHAGOGASTRODUODENOSCOPY N/A  09/19/2015   Procedure: ESOPHAGOGASTRODUODENOSCOPY (EGD);  Surgeon: [name], MD;  Location: [facility] ENDOSCOPY;  Service: Endoscopy;  Laterality: N/A;   KNEE ARTHROSCOPY WITH MEDIAL MENISECTOMY Right 05/14/2015   Procedure: RIGHT KNEE ARTHROSCOPY CHONDROPLASTY, PARTIAL  MEDIAL MENISECTOMY;  Surgeon: [name], MD;  Location: [place];  Service: Orthopedics;  Laterality: Right;   left knee replacement     left shoudler clean     LUMBAR DISC SURGERY  06/2006   L3   MENISCUS REPAIR  08/2008   left   NECK SURGERY     POSTERIOR FUSION LUMBAR SPINE  2005; 01/2007; 04/2009   L5; L3-4; L2-3   PROSTATE SURGERY  2000   SAVORY DILATION N/A 09/19/2015   Procedure: SAVORY DILATION;  Surgeon: [name], MD;  Location: [facility] ENDOSCOPY;  Service: Endoscopy;  Laterality: N/A;   SHOULDER OPEN ROTATOR CUFF REPAIR Left 1999   left   TONSILLECTOMY  09/21/11   TOTAL KNEE ARTHROPLASTY Left 01/2011   left   TOTAL KNEE ARTHROPLASTY Right 08/08/2015   Procedure: RIGHT TOTAL KNEE ARTHROPLASTY;  Surgeon: [name], MD;  Location: [place];  Service: Orthopedics;  Laterality: Right;   TRANSURETHRAL RESECTION OF PROSTATE  2006   followed by "surgery to get rid of clots"   UVULOPALATOPHARYNGOPLASTY, TONSILLECTOMY AND SEPTOPLASTY  09/21/11   Deviated Septum    Allergies  Allergies  Allergen Reactions   Penicillins Hives and Other (See Comments)    "I break out in welts" Has patient had a PCN reaction causing immediate rash, facial/tongue/throat swelling, SOB or lightheadedness with hypotension: No Has patient had a PCN reaction causing severe rash involving mucus membranes or skin necrosis: No Has patient had a PCN reaction that required hospitalization No Has patient had a PCN reaction occurring within the last 10 years: No If all of the above answers are "NO", then may proceed with Cephalosporin use.   Codeine Nausea And Vomiting   Sulfonamide Derivatives Nausea And Vomiting     EKGs/Labs/Other Studies Reviewed:   The following studies were reviewed today:  Echo 09/10/20  IMPRESSIONS     1. Left ventricular ejection fraction, by estimation, is 65 to 70%. The  left ventricle has normal function. The left ventricle has no regional  wall motion abnormalities. There is mild left ventricular hypertrophy.  Left ventricular diastolic parameters  were normal.   2. Right ventricular systolic function is normal. The right ventricular  size is normal. Tricuspid regurgitation signal is  inadequate for assessing  PA pressure.   3. The mitral valve is grossly normal. No evidence of mitral valve  regurgitation.   4. The aortic valve was not well visualized. Aortic valve regurgitation  is not visualized. No aortic stenosis is present.   5. Aortic dilatation noted. There is mild dilatation of the ascending  aorta, measuring 41 mm. There is mild dilatation of the aortic root,  measuring 42 mm.   6. The inferior vena cava is normal in size with greater than 50%  respiratory variability, suggesting right atrial pressure of 3 mmHg.   EKG:  EKG is not ordered today.   Recent Labs: 03/17/2022: ALT 19; TSH 2.00 08/13/2022: BUN 18; Creatinine, Ser 1.02; Hemoglobin 15.4; Platelets 238; Potassium 4.3; Sodium 138  Recent Lipid Panel    Component Value Date/Time   CHOL 107 08/26/2021 0826   TRIG 97 08/26/2021 0826   HDL 32 (L) 08/26/2021 0826   CHOLHDL 3.3 08/26/2021 0826   CHOLHDL 3 09/22/2020 1151   VLDL 24.4 09/22/2020 1151   LDLCALC 56 08/26/2021 0826     Home Medications   No outpatient medications have been marked as taking for the 09/28/22 encounter (Office Visit) with Elgie Collard, PA-C.     Review of Systems      All other systems reviewed and are otherwise negative except as noted above.  Physical Exam    VS:  BP 112/70   Pulse 68   Ht 5\' 10"  (1.778 m)   Wt 257 lb 6.4 oz (116.8 kg)   SpO2 94%   BMI 36.93 kg/m  , BMI Body mass index is 36.93  kg/m.  Wt Readings from Last 3 Encounters:  09/28/22 257 lb 6.4 oz (116.8 kg)  08/13/22 259 lb (117.5 kg)  06/02/22 258 lb (117 kg)     GEN: Well nourished, well developed, in no acute distress. HEENT: normal. Neck: Supple, no JVD, carotid bruits, or masses. Cardiac: RRR, no murmurs, rubs, or gallops. No clubbing, cyanosis, 1+ edema bilaterally.  Radials/PT 2+ and equal bilaterally.  Respiratory:  Respirations regular and unlabored, clear to auscultation bilaterally. GI: Soft, nontender, nondistended. MS: No deformity or atrophy. Skin: Warm and dry, no rash. Neuro:  Strength and sensation are intact. Psych: Normal affect.  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 Yelinek perioperative risk of a major cardiac event is 0.9% according to the Revised Cardiac Risk Index (RCRI).  Therefore, he is at low risk for perioperative complications.   His functional capacity is good at 4.73 METs according to the Duke Activity Status Index (DASI). Recommendations: According to ACC/AHA guidelines, no further cardiovascular testing needed.  The patient may proceed to surgery at acceptable risk.   Antiplatelet and/or Anticoagulation Recommendations: Aspirin can be held for 5-7 days prior to his surgery.  Please resume Aspirin post operatively when it is felt to be safe from a bleeding standpoint.    Hypertension -Hypotensive when he was seen a month ago by Dr. Harrington Challenger.  Today 112/70 -Continue Hyzaar 50-12.5 mg daily and metoprolol succinate 25 mg daily -Continue to trend blood pressure at home -Important to keep blood pressure low normal due to aortic aneurysm  CAD -No recent chest pain but he has chronic shortness of breath -Most recent echocardiogram reviewed from March 2022 with normal systolic and diastolic function, no significant valvular disease  Lower extremity edema/chronic SOB -This is chronic and Dr. Harrington Challenger started him on Lasix 40 mg daily -  He was  encouraged to wear lower extremity compression although patient has a tough time getting these on -Elevate feet when able -Maintain a low-sodium diet -follow-up with Dr. Valeta Harms to see if you need to resume your trelegy   Hyperlipidemia -Recent lab work includes LDL 56, HDL 32, total cholesterol 107, triglycerides 97 -He is due for repeat lipid panel and can get this done either at his PCP or with [name] when he comes in next         Disposition: Follow up 4-6 months with [name], MD or APP.  Signed, Elgie Collard, PA-C 09/28/2022, 8:43 AM [place]

## 2022-09-28 ENCOUNTER — Encounter: Payer: Self-pay | Admitting: Physician Assistant

## 2022-09-28 ENCOUNTER — Ambulatory Visit: Payer: Medicare Other | Attending: Physician Assistant | Admitting: Physician Assistant

## 2022-09-28 VITALS — BP 112/70 | HR 68 | Ht 70.0 in | Wt 257.4 lb

## 2022-09-28 DIAGNOSIS — E785 Hyperlipidemia, unspecified: Secondary | ICD-10-CM

## 2022-09-28 DIAGNOSIS — I10 Essential (primary) hypertension: Secondary | ICD-10-CM

## 2022-09-28 DIAGNOSIS — I2510 Atherosclerotic heart disease of native coronary artery without angina pectoris: Secondary | ICD-10-CM | POA: Diagnosis not present

## 2022-09-28 DIAGNOSIS — R600 Localized edema: Secondary | ICD-10-CM | POA: Diagnosis not present

## 2022-09-28 NOTE — Patient Instructions (Signed)
Medication Instructions:   Your physician recommends that you continue on your current medications as directed. Please refer to the Current Medication list given to you today.  *If you need a refill on your cardiac medications before your next appointment, please call your pharmacy*   Lab Work:  [place]    If you have labs (blood work) drawn today and your tests are completely normal, you will receive your results only by: [place] (if you have MyChart) OR A paper copy in the mail If you have any lab test that is abnormal or we need to change your treatment, we will call you to review the results.   Testing/Procedures: NONE ORDERED  TODAY    Follow-Up: At [facility], you and your health needs are our priority.  As part of our continuing mission to provide you with exceptional heart care, we have created designated Provider Care Teams.  These Care Teams include your primary Cardiologist (physician) and Advanced Practice Providers (APPs -  Physician Assistants and Nurse Practitioners) who all work together to provide you with the care you need, when you need it.  We recommend signing up for the patient portal called "MyChart".  Sign up information is provided on this After Visit Summary.  MyChart is used to connect with patients for Virtual Visits (Telemedicine).  Patients are able to view lab/test results, encounter notes, upcoming appointments, etc.  Non-urgent messages can be sent to your provider as well.   To learn more about what you can do with MyChart, go to [email or web address].    Your next appointment:  3-4 WEEKS /APP   3 -4 month(s)  Provider:   [name], MD     Other Instructions  Low-Sodium Eating Plan Sodium, which is an element that makes up salt, helps you maintain a healthy balance of fluids in your body. Too much sodium can increase your blood pressure and cause fluid and waste to be held in your body. Your health care provider  or dietitian may recommend following this plan if you have high blood pressure (hypertension), kidney disease, liver disease, or heart failure. Eating less sodium can help lower your blood pressure, reduce swelling, and protect your heart, liver, and kidneys. What are tips for following this plan? Reading food labels The Nutrition Facts label lists the amount of sodium in one serving of the food. If you eat more than one serving, you must multiply the listed amount of sodium by the number of servings. Choose foods with less than 140 mg of sodium per serving. Avoid foods with 300 mg of sodium or more per serving. Shopping  Look for lower-sodium products, often labeled as "low-sodium" or "no salt added." Always check the sodium content, even if foods are labeled as "unsalted" or "no salt added." Buy fresh foods. Avoid canned foods and pre-made or frozen meals. Avoid canned, cured, or processed meats. Buy breads that have less than 80 mg of sodium per slice. Cooking  Eat more home-cooked food and less restaurant, buffet, and fast food. Avoid adding salt when cooking. Use salt-free seasonings or herbs instead of table salt or sea salt. Check with your health care provider or pharmacist before using salt substitutes. Cook with plant-based oils, such as canola, sunflower, or olive oil. Meal planning When eating at a restaurant, ask that your food be prepared with less salt or no salt, if possible. Avoid dishes labeled as brined, pickled, cured, smoked, or made with soy sauce, miso, or teriyaki  sauce. Avoid foods that contain MSG (monosodium glutamate). MSG is sometimes added to [place] food, bouillon, and some canned foods. Make meals that can be grilled, baked, poached, roasted, or steamed. These are generally made with less sodium. General information Most people on this plan should limit their sodium intake to 1,500-2,000 mg (milligrams) of sodium each day. What foods should I  eat? Fruits Fresh, frozen, or canned fruit. Fruit juice. Vegetables Fresh or frozen vegetables. "No salt added" canned vegetables. "No salt added" tomato sauce and paste. Low-sodium or reduced-sodium tomato and vegetable juice. Grains Low-sodium cereals, including oats, puffed wheat and rice, and shredded wheat. Low-sodium crackers. Unsalted rice. Unsalted pasta. Low-sodium bread. Whole-grain breads and whole-grain pasta. Meats and other proteins Fresh or frozen (no salt added) meat, poultry, seafood, and fish. Low-sodium canned tuna and salmon. Unsalted nuts. Dried peas, beans, and lentils without added salt. Unsalted canned beans. Eggs. Unsalted nut butters. Dairy Milk. Soy milk. Cheese that is naturally low in sodium, such as ricotta cheese, fresh mozzarella, or Swiss cheese. Low-sodium or reduced-sodium cheese. Cream cheese. Yogurt. Seasonings and condiments Fresh and dried herbs and spices. Salt-free seasonings. Low-sodium mustard and ketchup. Sodium-free salad dressing. Sodium-free light mayonnaise. Fresh or refrigerated horseradish. Lemon juice. Vinegar. Other foods Homemade, reduced-sodium, or low-sodium soups. Unsalted popcorn and pretzels. Low-salt or salt-free chips. The items listed above may not be a complete list of foods and beverages you can eat. Contact a dietitian for more information. What foods should I avoid? Vegetables Sauerkraut, pickled vegetables, and relishes. Olives. [place] fries. Onion rings. Regular canned vegetables (not low-sodium or reduced-sodium). Regular canned tomato sauce and paste (not low-sodium or reduced-sodium). Regular tomato and vegetable juice (not low-sodium or reduced-sodium). Frozen vegetables in sauces. Grains Instant hot cereals. Bread stuffing, pancake, and biscuit mixes. Croutons. Seasoned rice or pasta mixes. Noodle soup cups. Boxed or frozen macaroni and cheese. Regular salted crackers. Self-rising flour. Meats and other proteins Meat or  fish that is salted, canned, smoked, spiced, or pickled. Precooked or cured meat, such as sausages or meat loaves. Berniece Salines. Ham. Pepperoni. Hot dogs. Corned beef. Chipped beef. Salt pork. Jerky. Pickled herring. Anchovies and sardines. Regular canned tuna. Salted nuts. Dairy Processed cheese and cheese spreads. Hard cheeses. Cheese curds. Blue cheese. Feta cheese. String cheese. Regular cottage cheese. Buttermilk. Canned milk. Fats and oils Salted butter. Regular margarine. Ghee. Bacon fat. Seasonings and condiments Onion salt, garlic salt, seasoned salt, table salt, and sea salt. Canned and packaged gravies. Worcestershire sauce. Tartar sauce. Barbecue sauce. Teriyaki sauce. Soy sauce, including reduced-sodium. Steak sauce. Fish sauce. Oyster sauce. Cocktail sauce. Horseradish that you find on the shelf. Regular ketchup and mustard. Meat flavorings and tenderizers. Bouillon cubes. Hot sauce. Pre-made or packaged marinades. Pre-made or packaged taco seasonings. Relishes. Regular salad dressings. Salsa. Other foods Salted popcorn and pretzels. Corn chips and puffs. Potato and tortilla chips. Canned or dried soups. Pizza. Frozen entrees and pot pies. The items listed above may not be a complete list of foods and beverages you should avoid. Contact a dietitian for more information. Summary Eating less sodium can help lower your blood pressure, reduce swelling, and protect your heart, liver, and kidneys. Most people on this plan should limit their sodium intake to 1,500-2,000 mg (milligrams) of sodium each day. Canned, boxed, and frozen foods are high in sodium. Restaurant foods, fast foods, and pizza are also very high in sodium. You also get sodium by adding salt to food. Try to cook at home, eat more fresh  fruits and vegetables, and eat less fast food and canned, processed, or prepared foods. This information is not intended to replace advice given to you by your health care provider. Make sure you  discuss any questions you have with your health care provider. Document Revised: 05/21/2019 Document Reviewed: 05/16/2019 Elsevier Patient Education  [place] Eating a healthy diet is important for the health of your heart. A heart-healthy eating plan includes: Eating less unhealthy fats. Eating more healthy fats. Eating less salt in your food. Salt is also called sodium. Making other changes in your diet. Talk with your doctor or a diet specialist (dietitian) to create an eating plan that is right for you. What is my plan? Your doctor may recommend an eating plan that includes: Total fat: ______% or less of total calories a day. Saturated fat: ______% or less of total calories a day. Cholesterol: less than _________mg a day. Sodium: less than _________mg a day. What are tips for following this plan? Cooking Avoid frying your food. Try to bake, boil, grill, or broil it instead. You can also reduce fat by: Removing the skin from poultry. Removing all visible fats from meats. Steaming vegetables in water or broth. Meal planning  At meals, divide your plate into four equal parts: Fill one-half of your plate with vegetables and green salads. Fill one-fourth of your plate with whole grains. Fill one-fourth of your plate with lean protein foods. Eat 2-4 cups of vegetables per day. One cup of vegetables is: 1 cup (91 g) broccoli or cauliflower florets. 2 medium carrots. 1 large bell pepper. 1 large sweet potato. 1 large tomato. 1 medium white potato. 2 cups (150 g) raw leafy greens. Eat 1-2 cups of fruit per day. One cup of fruit is: 1 small apple 1 large banana 1 cup (237 g) mixed fruit, 1 large orange,  cup (82 g) dried fruit, 1 cup (240 mL) 100% fruit juice. Eat more foods that have soluble fiber. These are apples, broccoli, carrots, beans, peas, and barley. Try to get 20-30 g of fiber per day. Eat 4-5 servings of nuts, legumes, and  seeds per week: 1 serving of dried beans or legumes equals  cup (90 g) cooked. 1 serving of nuts is  oz (12 almonds, 24 pistachios, or 7 walnut halves). 1 serving of seeds equals  oz (8 g). General information Eat more home-cooked food. Eat less restaurant, buffet, and fast food. Limit or avoid alcohol. Limit foods that are high in starch and sugar. Avoid fried foods. Lose weight if you are overweight. Keep track of how much salt (sodium) you eat. This is important if you have high blood pressure. Ask your doctor to tell you more about this. Try to add vegetarian meals each week. Fats Choose healthy fats. These include olive oil and canola oil, flaxseeds, walnuts, almonds, and seeds. Eat more omega-3 fats. These include salmon, mackerel, sardines, tuna, flaxseed oil, and ground flaxseeds. Try to eat fish at least 2 times each week. Check food labels. Avoid foods with trans fats or high amounts of saturated fat. Limit saturated fats. These are often found in animal products, such as meats, butter, and cream. These are also found in plant foods, such as palm oil, palm kernel oil, and coconut oil. Avoid foods with partially hydrogenated oils in them. These have trans fats. Examples are stick margarine, some tub margarines, cookies, crackers, and other baked goods. What foods should I eat? Fruits All fresh, canned (  in natural juice), or frozen fruits. Vegetables Fresh or frozen vegetables (raw, steamed, roasted, or grilled). Green salads. Grains Most grains. Choose whole wheat and whole grains most of the time. Rice and pasta, including brown rice and pastas made with whole wheat. Meats and other proteins Lean, well-trimmed beef, veal, pork, and lamb. Chicken and [place] without skin. All fish and shellfish. Wild duck, rabbit, pheasant, and venison. Egg whites or low-cholesterol egg substitutes. Dried beans, peas, lentils, and tofu. Seeds and most nuts. Dairy Low-fat or nonfat cheeses,  including ricotta and mozzarella. Skim or 1% milk that is liquid, powdered, or evaporated. Buttermilk that is made with low-fat milk. Nonfat or low-fat yogurt. Fats and oils Non-hydrogenated (trans-free) margarines. Vegetable oils, including soybean, sesame, sunflower, olive, peanut, safflower, corn, canola, and cottonseed. Salad dressings or mayonnaise made with a vegetable oil. Beverages Mineral water. Coffee and tea. Diet carbonated beverages. Sweets and desserts Sherbet, gelatin, and fruit ice. Small amounts of dark chocolate. Limit all sweets and desserts. Seasonings and condiments All seasonings and condiments. The items listed above may not be a complete list of foods and drinks you can eat. Contact a dietitian for more options. What foods should I avoid? Fruits Canned fruit in heavy syrup. Fruit in cream or butter sauce. Fried fruit. Limit coconut. Vegetables Vegetables cooked in cheese, cream, or butter sauce. Fried vegetables. Grains Breads that are made with saturated or trans fats, oils, or whole milk. Croissants. Sweet rolls. Donuts. High-fat crackers, such as cheese crackers. Meats and other proteins Fatty meats, such as hot dogs, ribs, sausage, bacon, rib-eye roast or steak. High-fat deli meats, such as salami and bologna. Caviar. Domestic duck and goose. Organ meats, such as liver. Dairy Cream, sour cream, cream cheese, and creamed cottage cheese. Whole-milk cheeses. Whole or 2% milk that is liquid, evaporated, or condensed. Whole buttermilk. Cream sauce or high-fat cheese sauce. Yogurt that is made from whole milk. Fats and oils Meat fat, or shortening. Cocoa butter, hydrogenated oils, palm oil, coconut oil, palm kernel oil. Solid fats and shortenings, including bacon fat, salt pork, lard, and butter. Nondairy cream substitutes. Salad dressings with cheese or sour cream. Beverages Regular sodas and juice drinks with added sugar. Sweets and desserts Frosting. Pudding.  Cookies. Cakes. Pies. Milk chocolate or white chocolate. Buttered syrups. Full-fat ice cream or ice cream drinks. The items listed above may not be a complete list of foods and drinks to avoid. Contact a dietitian for more information. Summary Heart-healthy meal planning includes eating less unhealthy fats, eating more healthy fats, and making other changes in your diet. Eat a balanced diet. This includes fruits and vegetables, low-fat or nonfat dairy, lean protein, nuts and legumes, whole grains, and heart-healthy oils and fats. This information is not intended to replace advice given to you by your health care provider. Make sure you discuss any questions you have with your health care provider. Document Revised: 07/20/2021 Document Reviewed: 07/20/2021 Elsevier Patient Education  [place].

## 2022-10-12 ENCOUNTER — Ambulatory Visit (INDEPENDENT_AMBULATORY_CARE_PROVIDER_SITE_OTHER): Payer: Medicare Other

## 2022-10-12 VITALS — Ht 70.0 in | Wt 257.0 lb

## 2022-10-12 DIAGNOSIS — Z0000 Encounter for general adult medical examination without abnormal findings: Secondary | ICD-10-CM

## 2022-10-12 NOTE — Patient Instructions (Signed)
[name] [name] , Thank you for taking time to come for your Medicare Wellness Visit. I appreciate your ongoing commitment to your health goals. Please review the following plan we discussed and let me know if I can assist you in the future.   These are the goals we discussed:  Goals       DIET - INCREASE WATER INTAKE      Patient Stated      Wants to be able to do yard work again and to eliminate some of his body pain.      Patient Stated (pt-stated)      I just want to feel better.        This is a list of the screening recommended for you and due dates:  Health Maintenance  Topic Date Due   Flu Shot  01/27/2023   Medicare Annual Wellness Visit  10/12/2023   Colon Cancer Screening  01/19/2026   DTaP/Tdap/Td vaccine (3 - Td or Tdap) 09/17/2028   Pneumonia Vaccine  Completed   Hepatitis C Screening: USPSTF Recommendation to screen - Ages 76-76 yo.  Completed   Zoster (Shingles) Vaccine  Completed   HPV Vaccine  Aged Out   COVID-19 Vaccine  Discontinued    Advanced directives: Forms are available if you choose in the future to pursue completion.  This is recommended in order to make sure that your health wishes are honored in the event that you are unable to verbalize them to the provider.    Conditions/risks identified: Aim for 30 minutes of exercise or brisk walking, 6-8 glasses of water, and 5 servings of fruits and vegetables each day.   Next appointment: Follow up in one year for your annual wellness visit.   Preventive Care 76 Years and Older, Male  Preventive care refers to lifestyle choices and visits with your health care provider that can promote health and wellness. What does preventive care include? A yearly physical exam. This is also called an annual well check. Dental exams once or twice a year. Routine eye exams. Ask your health care provider how often you should have your eyes checked. Personal lifestyle choices, including: Daily care of your teeth and  gums. Regular physical activity. Eating a healthy diet. Avoiding tobacco and drug use. Limiting alcohol use. Practicing safe sex. Taking low doses of aspirin every day. Taking vitamin and mineral supplements as recommended by your health care provider. What happens during an annual well check? The services and screenings done by your health care provider during your annual well check will depend on your age, overall health, lifestyle risk factors, and family history of disease. Counseling  Your health care provider may ask you questions about your: Alcohol use. Tobacco use. Drug use. Emotional well-being. Home and relationship well-being. Sexual activity. Eating habits. History of falls. Memory and ability to understand (cognition). Work and work Astronomer. Screening  You may have the following tests or measurements: Height, weight, and BMI. Blood pressure. Lipid and cholesterol levels. These may be checked every 5 years, or more frequently if you are over 25 years old. Skin check. Lung cancer screening. You may have this screening every year starting at age 56 if you have a 30-pack-year history of smoking and currently smoke or have quit within the past 15 years. Fecal occult blood test (FOBT) of the stool. You may have this test every year starting at age 76. Flexible sigmoidoscopy or colonoscopy. You may have a sigmoidoscopy every 5 years or a colonoscopy every  10 years starting at age 76. Prostate cancer screening. Recommendations will vary depending on your family history and other risks. Hepatitis C blood test. Hepatitis B blood test. Sexually transmitted disease (STD) testing. Diabetes screening. This is done by checking your blood sugar (glucose) after you have not eaten for a while (fasting). You may have this done every 1-3 years. Abdominal aortic aneurysm (AAA) screening. You may need this if you are a current or former smoker. Osteoporosis. You may be screened  starting at age 76. if you are at high risk. Talk with your health care provider about your test results, treatment options, and if necessary, the need for more tests. Vaccines  Your health care provider may recommend certain vaccines, such as: Influenza vaccine. This is recommended every year. Tetanus, diphtheria, and acellular pertussis (Tdap, Td) vaccine. You may need a Td booster every 10 years. Zoster vaccine. You may need this after age 76. Pneumococcal 13-valent conjugate (PCV13) vaccine. One dose is recommended after age 76. Pneumococcal polysaccharide (PPSV23) vaccine. One dose is recommended after age 76. Talk to your health care provider about which screenings and vaccines you need and how often you need them. This information is not intended to replace advice given to you by your health care provider. Make sure you discuss any questions you have with your health care provider. Document Released: 07/11/2015 Document Revised: 03/03/2016 Document Reviewed: 04/15/2015 Elsevier Interactive Patient Education  2017 [name] Prevention in the Home Falls can cause injuries. They can happen to people of all ages. There are many things you can do to make your home safe and to help prevent falls. What can I do on the outside of my home? Regularly fix the edges of walkways and driveways and fix any cracks. Remove anything that might make you trip as you walk through a door, such as a raised step or threshold. Trim any bushes or trees on the path to your home. Use bright outdoor lighting. Clear any walking paths of anything that might make someone trip, such as rocks or tools. Regularly check to see if handrails are loose or broken. Make sure that both sides of any steps have handrails. Any raised decks and porches should have guardrails on the edges. Have any leaves, snow, or ice cleared regularly. Use sand or salt on walking paths during winter. Clean up any spills in your garage  right away. This includes oil or grease spills. What can I do in the bathroom? Use night lights. Install grab bars by the toilet and in the tub and shower. Do not use towel bars as grab bars. Use non-skid mats or decals in the tub or shower. If you need to sit down in the shower, use a plastic, non-slip stool. Keep the floor dry. Clean up any water that spills on the floor as soon as it happens. Remove soap buildup in the tub or shower regularly. Attach bath mats securely with double-sided non-slip rug tape. Do not have throw rugs and other things on the floor that can make you trip. What can I do in the bedroom? Use night lights. Make sure that you have a light by your bed that is easy to reach. Do not use any sheets or blankets that are too big for your bed. They should not hang down onto the floor. Have a firm chair that has side arms. You can use this for support while you get dressed. Do not have throw rugs and other things on the  floor that can make you trip. What can I do in the kitchen? Clean up any spills right away. Avoid walking on wet floors. Keep items that you use a lot in easy-to-reach places. If you need to reach something above you, use a strong step stool that has a grab bar. Keep electrical cords out of the way. Do not use floor polish or wax that makes floors slippery. If you must use wax, use non-skid floor wax. Do not have throw rugs and other things on the floor that can make you trip. What can I do with my stairs? Do not leave any items on the stairs. Make sure that there are handrails on both sides of the stairs and use them. Fix handrails that are broken or loose. Make sure that handrails are as long as the stairways. Check any carpeting to make sure that it is firmly attached to the stairs. Fix any carpet that is loose or worn. Avoid having throw rugs at the top or bottom of the stairs. If you do have throw rugs, attach them to the floor with carpet tape. Make  sure that you have a light switch at the top of the stairs and the bottom of the stairs. If you do not have them, ask someone to add them for you. What else can I do to help prevent falls? Wear shoes that: Do not have high heels. Have rubber bottoms. Are comfortable and fit you well. Are closed at the toe. Do not wear sandals. If you use a stepladder: Make sure that it is fully opened. Do not climb a closed stepladder. Make sure that both sides of the stepladder are locked into place. Ask someone to hold it for you, if possible. Clearly mark and make sure that you can see: Any grab bars or handrails. First and last steps. Where the edge of each step is. Use tools that help you move around (mobility aids) if they are needed. These include: Canes. Walkers. Scooters. Crutches. Turn on the lights when you go into a dark area. Replace any light bulbs as soon as they burn out. Set up your furniture so you have a clear path. Avoid moving your furniture around. If any of your floors are uneven, fix them. If there are any pets around you, be aware of where they are. Review your medicines with your doctor. Some medicines can make you feel dizzy. This can increase your chance of falling. Ask your doctor what other things that you can do to help prevent falls. This information is not intended to replace advice given to you by your health care provider. Make sure you discuss any questions you have with your health care provider. Document Released: 04/10/2009 Document Revised: 11/20/2015 Document Reviewed: 07/19/2014 Elsevier Interactive Patient Education  2017 [employer].

## 2022-10-12 NOTE — Progress Notes (Signed)
Subjective:   [name] [name] is a 77 y.o. male who presents for Medicare Annual/Subsequent preventive examination.  I connected with  [name] on 10/12/22 by a audio enabled telemedicine application and verified that I am speaking with the correct person using two identifiers.  Patient Location: Home  Provider Location: Home Office  I discussed the limitations of evaluation and management by telemedicine. The patient expressed understanding and agreed to proceed.  Review of Systems     Cardiac Risk Factors include: advanced age (>21men, >13 women);male gender;hypertension;dyslipidemia;sedentary lifestyle     Objective:    Today's Vitals   10/12/22 0922  Weight: 257 lb (116.6 kg)  Height: 5\' 10"  (1.778 m)   Body mass index is 36.88 kg/m.     10/12/2022    9:39 AM 06/02/2022   11:01 AM 10/07/2021    8:50 AM 10/06/2020    9:43 AM 08/29/2020   10:59 AM 01/20/2016    9:59 AM 09/18/2015    8:44 AM  Advanced Directives  Does Patient Have a Medical Advance Directive? No No No No No No No  Does patient want to make changes to medical advance directive?    No - Patient declined     Would patient like information on creating a medical advance directive? No - Patient declined  No - Patient declined  No - Patient declined  No - patient declined information    Current Medications (verified) Outpatient Encounter Medications as of 10/12/2022  Medication Sig   aspirin EC 81 MG tablet Take 1 tablet (81 mg total) by mouth 2 (two) times daily.   carbamazepine (TEGRETOL) 200 MG tablet Take 200 mg by mouth 2 (two) times daily.   cholecalciferol (VITAMIN D3) 25 MCG (1000 UNIT) tablet Take 2,000 Units by mouth daily.   DULoxetine (CYMBALTA) 30 MG capsule TAKE 1 CAPSULE BY MOUTH DAILY   Fluticasone-Umeclidin-Vilant (TRELEGY ELLIPTA) 100-62.5-25 MCG/ACT AEPB Inhale into the lungs.   furosemide (LASIX) 40 MG tablet TAKE 1 TABLET BY MOUTH DAILY   gabapentin (NEURONTIN) 300 MG capsule Take 1  capsule (300 mg total) by mouth 3 (three) times daily.   levothyroxine (SYNTHROID) 125 MCG tablet TAKE 1 TABLET BY MOUTH DAILY   losartan-hydrochlorothiazide (HYZAAR) 100-25 MG tablet TAKE ONE-HALF TABLET BY MOUTH  DAILY   metoprolol succinate (TOPROL-XL) 25 MG 24 hr tablet TAKE 1 TABLET BY MOUTH DAILY   Omega-3 Fatty Acids (SUPER OMEGA-3) 1000 MG CAPS Take by mouth.   omeprazole (PRILOSEC) 40 MG capsule TAKE 1 CAPSULE BY MOUTH TWICE  DAILY   polyethylene glycol (MIRALAX / GLYCOLAX) 17 g packet Take 17 g by mouth daily.   potassium chloride SA (KLOR-CON M) 20 MEQ tablet TAKE 1 TABLET BY MOUTH DAILY   simvastatin (ZOCOR) 20 MG tablet TAKE 1 TABLET BY MOUTH DAILY AT  6 PM.   [DISCONTINUED] Multiple Vitamins-Minerals (ICAPS AREDS 2 PO) Take 1 capsule by mouth daily. (Patient not taking: Reported on 10/12/2022)   No facility-administered encounter medications on file as of 10/12/2022.    Allergies (verified) Penicillins, Codeine, and Sulfonamide derivatives   History: Past Medical History:  Diagnosis Date   Arthritis    CAD (coronary artery disease)    cath 3/10: mLAD 30%, pRCA 30%, EF 55-60% Dr. Tenny Craw cardiologist  most recent  stress stress done ~ 2 years ago with Dr. Tenny Craw   Complication of anesthesia    "he usually gets an ileus after back OR"    Depression    Dyspnea    -  PFTs compeltely normal 03/20/08 including DLc0   Environmental allergies    Dust, Smoke   GERD (gastroesophageal reflux disease)    HTN (hypertension)    Hypothyroidism    Low testosterone    Observed sleep apnea    can't wear cpap   Polycythemia    PONV (postoperative nausea and vomiting)    Pulmonary nodule 06/13/2012   Rosacea    Sleep apnea    Most recent sleep study 2010; [facility]   Past Surgical History:  Procedure Laterality Date   BACK SURGERY  2005; 06/2006; 01/2007; 04/2009   CARDIAC CATHETERIZATION  08/2008   CATARACT EXTRACTION, BILATERAL  2010   CERVICAL FUSION  04/2002    ?C3-4   COLONOSCOPY  12/2005   normal screening study.    ESOPHAGOGASTRODUODENOSCOPY  multiple   last 07/2012 GERD esophagitis, 48 Fr dilation   ESOPHAGOGASTRODUODENOSCOPY N/A 09/19/2015   Procedure: ESOPHAGOGASTRODUODENOSCOPY (EGD);  Surgeon: Meryl Dare, MD;  Location: [facility] ENDOSCOPY;  Service: Endoscopy;  Laterality: N/A;   KNEE ARTHROSCOPY WITH MEDIAL MENISECTOMY Right 05/14/2015   Procedure: RIGHT KNEE ARTHROSCOPY CHONDROPLASTY, PARTIAL  MEDIAL MENISECTOMY;  Surgeon: Frederico Hamman, MD;  Location: [facility];  Service: Orthopedics;  Laterality: Right;   left knee replacement     left shoudler clean     LUMBAR DISC SURGERY  06/2006   L3   MENISCUS REPAIR  08/2008   left   NECK SURGERY     POSTERIOR FUSION LUMBAR SPINE  2005; 01/2007; 04/2009   L5; L3-4; L2-3   PROSTATE SURGERY  2000   SAVORY DILATION N/A 09/19/2015   Procedure: SAVORY DILATION;  Surgeon: Meryl Dare, MD;  Location: [facility] ENDOSCOPY;  Service: Endoscopy;  Laterality: N/A;   SHOULDER OPEN ROTATOR CUFF REPAIR Left 1999   left   TONSILLECTOMY  09/21/11   TOTAL KNEE ARTHROPLASTY Left 01/2011   left   TOTAL KNEE ARTHROPLASTY Right 08/08/2015   Procedure: RIGHT TOTAL KNEE ARTHROPLASTY;  Surgeon: Frederico Hamman, MD;  Location: [facility] OR;  Service: Orthopedics;  Laterality: Right;   TRANSURETHRAL RESECTION OF PROSTATE  2006   followed by "surgery to get rid of clots"   UVULOPALATOPHARYNGOPLASTY, TONSILLECTOMY AND SEPTOPLASTY  09/21/11   Deviated Septum   Family History  Problem Relation Age of Onset   Breast cancer Mother        dx. mid-70s   Colon polyps Mother        "98+ colon polyps per yr"   Diabetes Mother    Breast cancer Sister 18   Other Sister        + MUTYH mutation   Diabetes Sister        borderline   Heart Problems Sister    Breast cancer Sister        dx. 40s   Heart attack Father    Alzheimer's disease Father    Lung cancer Paternal Uncle        x4   Throat cancer Maternal Uncle         mother's maternal half-brother; dx 17s; +EtOH   Liver cancer Maternal Uncle        mother's maternal half-brother; either liver cancer or cirrhosis   Cirrhosis Maternal Uncle        +EtOH   Stroke Paternal Grandmother    Heart attack Paternal Grandfather    Other Daughter        negative genetic testing in 2017   Parkinson's disease Other  Bipolar disorder Other    Lung cancer Paternal Uncle        (x3)   Lung cancer Paternal Uncle    Alzheimer's disease Cousin        paternal 1st cousin, d. 110   Heart attack Cousin    Anesthesia problems Neg Hx    Social History   Socioeconomic History   Marital status: Married    Spouse name: Not on file   Number of children: 3   Years of education: Not on file   Highest education level: Not on file  Occupational History   Occupation: retired    Associate Professor: [employer]  Tobacco Use   Smoking status: Former    Packs/day: 1.00    Years: 16.00    Additional pack years: 0.00    Total pack years: 16.00    Types: Cigarettes    Quit date: 07/17/1974    Years since quitting: 48.2   Smokeless tobacco: Former  Building services engineer Use: Never used  Substance and Sexual Activity   Alcohol use: No   Drug use: No   Sexual activity: Not on file  Other Topics Concern   Not on file  Social History Narrative   Area, 3 children.Retired from the [employer] of [employer] caffeinated beverages daily   Home is one story with wife   Social Determinants of Health   Financial Resource Strain: Low Risk  (10/12/2022)   Overall Financial Resource Strain (CARDIA)    Difficulty of Paying Living Expenses: Not hard at all  Food Insecurity: No Food Insecurity (10/12/2022)   Hunger Vital Sign    Worried About Running Out of Food in the Last Year: Never true    Ran Out of Food in the Last Year: Never true  Transportation Needs: No Transportation Needs (10/12/2022)   PRAPARE - Administrator, Civil Service (Medical): No    Lack of Transportation  (Non-Medical): No  Physical Activity: Inactive (10/12/2022)   Exercise Vital Sign    Days of Exercise per Week: 0 days    Minutes of Exercise per Session: 0 min  Stress: No Stress Concern Present (10/12/2022)   [employer] of Occupational Health - Occupational Stress Questionnaire    Feeling of Stress : Not at all  Social Connections: Socially Integrated (10/12/2022)   Social Connection and Isolation Panel [NHANES]    Frequency of Communication with Friends and Family: More than three times a week    Frequency of Social Gatherings with Friends and Family: More than three times a week    Attends Religious Services: More than 4 times per year    Active Member of [employer] or Organizations: Yes    Attends Engineer, structural: More than 4 times per year    Marital Status: Married    Tobacco Counseling Counseling given: Not Answered   Clinical Intake:  Pre-visit preparation completed: Yes  Pain : No/denies pain  Diabetes: No  How often do you need to have someone help you when you read instructions, pamphlets, or other written materials from your doctor or pharmacy?: 1 - Never  Diabetic?No   Interpreter Needed?: No  Information entered by :: Kandis Fantasia LPN   Activities of Daily Living    10/12/2022    9:39 AM  In your present state of health, do you have any difficulty performing the following activities:  Hearing? 1  Vision? 0  Difficulty concentrating or making decisions? 0  Walking or climbing stairs?  1  Dressing or bathing? 0  Doing errands, shopping? 1  Preparing Food and eating ? N  Using the Toilet? N  In the past six months, have you accidently leaked urine? N  Do you have problems with loss of bowel control? N  Managing your Medications? N  Managing your Finances? N  Housekeeping or managing your Housekeeping? Y    Patient Care Team: Etta Grandchild, MD as PCP - General (Internal Medicine) Pricilla Riffle, MD as PCP - Cardiology  (Cardiology) Benjiman Core, MD (Hematology and Oncology) Bjorn Pippin, MD (Urology) Barnett Abu, MD (Neurosurgery) Janet Berlin, MD as Consulting Physician (Ophthalmology) Delories Heinz, DPM (Podiatry) Iva Boop, MD as Consulting Physician (Gastroenterology) Szabat, Vinnie Level, [facility] (Inactive) (Pharmacist) Josephine Igo, DO as Consulting Physician (Pulmonary Disease)  Indicate any recent Medical Services you may have received from other than [facility] providers in the past year (date may be approximate).     Assessment:   This is a routine wellness examination for [place].  Hearing/Vision screen Hearing Screening - Comments:: Wears hearing aids  Vision Screening - Comments:: Wears rx glasses - up to date with routine eye exams with Dr. Burgess Estelle    Dietary issues and exercise activities discussed: Current Exercise Habits: The patient does not participate in regular exercise at present   Goals Addressed             This Visit's Progress    COMPLETED: Client understands the importance of follow-up with providers by attending scheduled visits       COMPLETED: Manage My Medicine       Timeframe:  Long-Range Goal Priority:  Medium Start Date:     10/29/20                        Expected End Date: 10/21/2022                      Follow Up Date 03/2022   - call for medicine refill 2 or 3 days before it runs out - call if I am sick and can't take my medicine - keep a list of all the medicines I take; vitamins and herbals too - use a pillbox to sort medicine  -Reduce salt in diet (<2000 mg ideally)   Why is this important?   These steps will help you keep on track with your medicines.   Notes:       Depression Screen    10/12/2022    9:38 AM 12/01/2021    2:29 PM 10/07/2021    8:59 AM 10/06/2020    9:42 AM 09/22/2020   12:04 PM 09/05/2019    9:52 AM 09/18/2018   11:10 AM  PHQ 2/9 Scores  PHQ - 2 Score 0 1 2 2  0 1 1  PHQ- 9 Score  12   5 11 3     Fall Risk     10/12/2022    9:37 AM 06/02/2022   11:01 AM 12/01/2021    2:29 PM 10/07/2021    8:51 AM 10/06/2020    9:43 AM  Fall Risk   Falls in the past year? 1 1 1  0 1  Number falls in past yr: 1 1 1  0 1  Injury with Fall? 0 1 0 0 0  Risk for fall due to : History of fall(s);Impaired balance/gait;Impaired mobility   No Fall Risks Impaired balance/gait  Follow up Falls prevention discussed;Education provided;Falls evaluation  completed Falls evaluation completed  Falls evaluation completed Falls evaluation completed    FALL RISK PREVENTION PERTAINING TO THE HOME:  Any stairs in or around the home? Yes  If so, are there any without handrails? No  Home free of loose throw rugs in walkways, pet beds, electrical cords, etc? Yes  Adequate lighting in your home to reduce risk of falls? Yes   ASSISTIVE DEVICES UTILIZED TO PREVENT FALLS:  Life alert? No  Use of a cane, walker or w/c? Yes  Grab bars in the bathroom? Yes  Shower chair or bench in shower? No  Elevated toilet seat or a handicapped toilet? Yes   TIMED UP AND GO:  Was the test performed? No . Telephonic visit   Cognitive Function:        10/12/2022    9:40 AM 10/07/2021    9:01 AM 10/03/2019   11:19 AM  6CIT Screen  What Year? 0 points 0 points 0 points  What month? 0 points 0 points 0 points  What time? 0 points 0 points 0 points  Count back from 20 0 points 0 points 0 points  Months in reverse 0 points 0 points 0 points  Repeat phrase 2 points 0 points 0 points  Total Score 2 points 0 points 0 points    Immunizations Immunization History  Administered Date(s) Administered   Fluad Quad(high Dose 65+) 03/08/2019, 03/19/2021, 03/17/2022   Influenza Split 03/28/2013   Influenza, High Dose Seasonal PF 03/28/2018   Influenza,inj,Quad PF,6+ Mos 02/26/2020   Influenza-Unspecified 03/29/2011   PPD Test 08/14/2015   Pneumococcal Conjugate-13 07/03/2014   Pneumococcal Polysaccharide-23 02/19/2013, 03/08/2019   Tdap 07/15/2008,  09/18/2018   Zoster Recombinat (Shingrix) 02/23/2018, 05/03/2018    TDAP status: Up to date  Flu Vaccine status: Up to date  Pneumococcal vaccine status: Up to date  Covid-19 vaccine status: Information provided on how to obtain vaccines.   Qualifies for Shingles Vaccine? Yes   Zostavax completed No   Shingrix Completed?: Yes  Screening Tests Health Maintenance  Topic Date Due   INFLUENZA VACCINE  01/27/2023   Medicare Annual Wellness (AWV)  10/12/2023   COLONOSCOPY (Pts 45-20yrs Insurance coverage will need to be confirmed)  01/19/2026   DTaP/Tdap/Td (3 - Td or Tdap) 09/17/2028   Pneumonia Vaccine 45+ Years old  Completed   Hepatitis C Screening  Completed   Zoster Vaccines- Shingrix  Completed   HPV VACCINES  Aged Out   COVID-19 Vaccine  Discontinued    Health Maintenance  There are no preventive care reminders to display for this patient.   Colorectal cancer screening: Type of screening: Colonoscopy. Completed 01/20/16. Repeat every 10 years  Lung Cancer Screening: (Low Dose CT Chest recommended if Age 42-80 years, 30 pack-year currently smoking OR have quit w/in 15years.) does not qualify.   Lung Cancer Screening Referral: n/a  Additional Screening:  Hepatitis C Screening: does qualify; Completed 09/18/18  Vision Screening: Recommended annual ophthalmology exams for early detection of glaucoma and other disorders of the eye. Is the patient up to date with their annual eye exam?  Yes  Who is the provider or what is the name of the office in which the patient attends annual eye exams? Dr. Burgess Estelle  If pt is not established with a provider, would they like to be referred to a provider to establish care? No .   Dental Screening: Recommended annual dental exams for proper oral hygiene  Community Resource Referral / Chronic Care Management: CRR required  this visit?  No   CCM required this visit?  No      Plan:     I have personally reviewed and noted the  following in the patient's chart:   Medical and social history Use of alcohol, tobacco or illicit drugs  Current medications and supplements including opioid prescriptions. Patient is not currently taking opioid prescriptions. Functional ability and status Nutritional status Physical activity Advanced directives List of other physicians Hospitalizations, surgeries, and ER visits in previous 12 months Vitals Screenings to include cognitive, depression, and falls Referrals and appointments  In addition, I have reviewed and discussed with patient certain preventive protocols, quality metrics, and best practice recommendations. A written personalized care plan for preventive services as well as general preventive health recommendations were provided to patient.     Durwin Nora, [place]   5/40/9811   Due to this being a virtual visit, the after visit summary with patients personalized plan was offered to patient via mail or my-chart. Patient would like to access on my-chart  Nurse Notes: No concerns

## 2022-10-27 NOTE — Progress Notes (Deleted)
Office Visit    Patient Name: [name] [name] Date of Encounter: 10/27/2022  PCP:  Etta Grandchild, MD   [facility]  Cardiologist:  Dietrich Pates, MD  Advanced Practice Provider:  No care team member to display Electrophysiologist:  None   HPI    [name] [name] is a 77 y.o. 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  Patient was seen in the clinic March 2022.  At that time was still having some edema and it was felt to be due to venous insufficiency.  Improved with Lasix.  He was seen by Dr. Tenny Craw 2/16 and at that time was still getting short of breath with activity.  Denied chest pain.  Did endorse some occasional dizziness.  No syncope.  He did have some edema in his legs. He was hypotensive at that time and planned to hold some of his medications and follow-up in a few weeks.  Patient was seen February 2023 and at that time denied chest pain, breathing was okay.  No dizziness.  Says he was drinking fluids.  He was last seen 09/28/22, he tells me he is doing okay.  He is having some pain in his face and needs a  "hole drilled into his head".  He is scheduled to have a trigeminal nerve decompression with Dr. Danielle Dess.  He has not had any chest pain but he does have chronic shortness of breath.  He has chronic bronchitis and sees Dr. I card.  He was taking trelegy elliptica and this really helped his shortness of breath but he stopped it after his bronchitis infection ended.  However, he is unsure if he should have stopped this medication since it was helping him and he was given 6 refills.  Sounds like he may have missed his follow-up with Dr. I card.  He plans to reach back out to them for guidance.  He tells me that he drags his feet and he has had his knees operated on.  He has had several surgeries that hold him back physically.  He thinks he might have a  pinched nerve in his neck.  He worked until he was 77 years of age and then once he retired he felt like his body fell apart.  His shoulder is the biggest problem and needs replaced.  He is able to walk around his house using a walker but would have trouble walking 1-2 blocks.  He is able to do stairs 1 stair at a time and slowly.  He has barely met the minimum of 4 METS.  Recent echocardiogram reviewed with patient.  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  He tells me he thinks his sleep apnea is gone because his tonsils and adenoids were removed but he still does have some daytime sleepiness and snores per his wife.  Today, he ***  Past Medical History    Past Medical History:  Diagnosis Date   Arthritis    CAD (coronary artery disease)    cath 3/10: mLAD 30%, pRCA 30%, EF 55-60% Dr. Tenny Craw cardiologist  most recent  stress stress done ~ 2 years ago with Dr. Tenny Craw   Complication of anesthesia    "he usually gets an ileus after back OR"    Depression  Dyspnea    -PFTs compeltely normal 03/20/08 including DLc0   Environmental allergies    Dust, Smoke   GERD (gastroesophageal reflux disease)    HTN (hypertension)    Hypothyroidism    Low testosterone    Observed sleep apnea    can't wear cpap   Polycythemia    PONV (postoperative nausea and vomiting)    Pulmonary nodule 06/13/2012   Rosacea    Sleep apnea    Most recent sleep study 2010; records at [facility]   Past Surgical History:  Procedure Laterality Date   BACK SURGERY  2005; 06/2006; 01/2007; 04/2009   CARDIAC CATHETERIZATION  08/2008   CATARACT EXTRACTION, BILATERAL  2010   CERVICAL FUSION  04/2002   ?C3-4   COLONOSCOPY  12/2005   normal screening study.    ESOPHAGOGASTRODUODENOSCOPY  multiple   last 07/2012 GERD esophagitis, 48 Fr dilation   ESOPHAGOGASTRODUODENOSCOPY N/A 09/19/2015   Procedure: ESOPHAGOGASTRODUODENOSCOPY (EGD);  Surgeon:  Meryl Dare, MD;  Location: [facility] ENDOSCOPY;  Service: Endoscopy;  Laterality: N/A;   KNEE ARTHROSCOPY WITH MEDIAL MENISECTOMY Right 05/14/2015   Procedure: RIGHT KNEE ARTHROSCOPY CHONDROPLASTY, PARTIAL  MEDIAL MENISECTOMY;  Surgeon: Frederico Hamman, MD;  Location: [facility];  Service: Orthopedics;  Laterality: Right;   left knee replacement     left shoudler clean     LUMBAR DISC SURGERY  06/2006   L3   MENISCUS REPAIR  08/2008   left   NECK SURGERY     POSTERIOR FUSION LUMBAR SPINE  2005; 01/2007; 04/2009   L5; L3-4; L2-3   PROSTATE SURGERY  2000   SAVORY DILATION N/A 09/19/2015   Procedure: SAVORY DILATION;  Surgeon: Meryl Dare, MD;  Location: [facility] ENDOSCOPY;  Service: Endoscopy;  Laterality: N/A;   SHOULDER OPEN ROTATOR CUFF REPAIR Left 1999   left   TONSILLECTOMY  09/21/11   TOTAL KNEE ARTHROPLASTY Left 01/2011   left   TOTAL KNEE ARTHROPLASTY Right 08/08/2015   Procedure: RIGHT TOTAL KNEE ARTHROPLASTY;  Surgeon: Frederico Hamman, MD;  Location: [facility] OR;  Service: Orthopedics;  Laterality: Right;   TRANSURETHRAL RESECTION OF PROSTATE  2006   followed by "surgery to get rid of clots"   UVULOPALATOPHARYNGOPLASTY, TONSILLECTOMY AND SEPTOPLASTY  09/21/11   Deviated Septum    Allergies  Allergies  Allergen Reactions   Penicillins Hives and Other (See Comments)    "I break out in welts" Has patient had a PCN reaction causing immediate rash, facial/tongue/throat swelling, SOB or lightheadedness with hypotension: No Has patient had a PCN reaction causing severe rash involving mucus membranes or skin necrosis: No Has patient had a PCN reaction that required hospitalization No Has patient had a PCN reaction occurring within the last 10 years: No If all of the above answers are "NO", then may proceed with Cephalosporin use.   Codeine Nausea And Vomiting   Sulfonamide Derivatives Nausea And Vomiting    EKGs/Labs/Other Studies Reviewed:   The following studies were  reviewed today:  Echo 09/10/20  IMPRESSIONS     1. Left ventricular ejection fraction, by estimation, is 65 to 70%. The  left ventricle has normal function. The left ventricle has no regional  wall motion abnormalities. There is mild left ventricular hypertrophy.  Left ventricular diastolic parameters  were normal.   2. Right ventricular systolic function is normal. The right ventricular  size is normal. Tricuspid regurgitation signal is inadequate for assessing  PA pressure.   3. The mitral valve is grossly normal.  No evidence of mitral valve  regurgitation.   4. The aortic valve was not well visualized. Aortic valve regurgitation  is not visualized. No aortic stenosis is present.   5. Aortic dilatation noted. There is mild dilatation of the ascending  aorta, measuring 41 mm. There is mild dilatation of the aortic root,  measuring 42 mm.   6. The inferior vena cava is normal in size with greater than 50%  respiratory variability, suggesting right atrial pressure of 3 mmHg.   EKG:  EKG is not ordered today.   Recent Labs: 03/17/2022: ALT 19; TSH 2.00 08/13/2022: BUN 18; Creatinine, Ser 1.02; Hemoglobin 15.4; Platelets 238; Potassium 4.3; Sodium 138  Recent Lipid Panel    Component Value Date/Time   CHOL 107 08/26/2021 0826   TRIG 97 08/26/2021 0826   HDL 32 (L) 08/26/2021 0826   CHOLHDL 3.3 08/26/2021 0826   CHOLHDL 3 09/22/2020 1151   VLDL 24.4 09/22/2020 1151   LDLCALC 56 08/26/2021 0826     Home Medications   No outpatient medications have been marked as taking for the 10/28/22 encounter (Appointment) with Sharlene Dory, PA-C.     Review of Systems      All other systems reviewed and are otherwise negative except as noted above.  Physical Exam    VS:  There were no vitals taken for this visit. , BMI There is no height or weight on file to calculate BMI.  Wt Readings from Last 3 Encounters:  10/12/22 257 lb (116.6 kg)  09/28/22 257 lb 6.4 oz (116.8 kg)   08/13/22 259 lb (117.5 kg)     GEN: Well nourished, well developed, in no acute distress. HEENT: normal. Neck: Supple, no JVD, carotid bruits, or masses. Cardiac: RRR, no murmurs, rubs, or gallops. No clubbing, cyanosis, 1+ edema bilaterally.  Radials/PT 2+ and equal bilaterally.  Respiratory:  Respirations regular and unlabored, clear to auscultation bilaterally. GI: Soft, nontender, nondistended. MS: No deformity or atrophy. Skin: Warm and dry, no rash. Neuro:  Strength and sensation are intact. Psych: Normal affect.  Assessment & Plan     Hypertension -Hypotensive when he was seen a month ago by Dr. Tenny Craw.  Today 112/70 -Continue Hyzaar 50-12.5 mg daily and metoprolol succinate 25 mg daily -Continue to trend blood pressure at home -Important to keep blood pressure low normal due to aortic aneurysm  CAD -No recent chest pain but he has chronic shortness of breath -Most recent echocardiogram reviewed from March 2022 with normal systolic and diastolic function, no significant valvular disease  Lower extremity edema/chronic SOB -This is chronic and Dr. Tenny Craw started him on Lasix 40 mg daily -He was encouraged to wear lower extremity compression although patient has a tough time getting these on -Elevate feet when able -Maintain a low-sodium diet -follow-up with Dr. Tonia Brooms to see if you need to resume your trelegy   Hyperlipidemia -Recent lab work includes LDL 56, HDL 32, total cholesterol 540, triglycerides 97 -He is due for repeat lipid panel and can get this done either at his PCP or with [name] when he comes in next  No BP recorded.  {Refresh Note OR Click here to enter BP  :1}***      Disposition: Follow up *** with Dietrich Pates, MD or APP.  Signed, Sharlene Dory, PA-C 10/27/2022, 7:24 PM [facility]

## 2022-10-28 ENCOUNTER — Encounter: Payer: Self-pay | Admitting: Physician Assistant

## 2022-10-28 ENCOUNTER — Ambulatory Visit: Payer: Medicare Other | Attending: Physician Assistant | Admitting: Physician Assistant

## 2022-10-28 ENCOUNTER — Ambulatory Visit: Payer: Medicare Other | Admitting: Physician Assistant

## 2022-10-28 VITALS — BP 120/72 | HR 70 | Ht 70.0 in | Wt 257.2 lb

## 2022-10-28 DIAGNOSIS — I10 Essential (primary) hypertension: Secondary | ICD-10-CM | POA: Diagnosis not present

## 2022-10-28 DIAGNOSIS — Z79899 Other long term (current) drug therapy: Secondary | ICD-10-CM

## 2022-10-28 DIAGNOSIS — I2510 Atherosclerotic heart disease of native coronary artery without angina pectoris: Secondary | ICD-10-CM

## 2022-10-28 DIAGNOSIS — R600 Localized edema: Secondary | ICD-10-CM | POA: Diagnosis not present

## 2022-10-28 DIAGNOSIS — E785 Hyperlipidemia, unspecified: Secondary | ICD-10-CM

## 2022-10-28 LAB — PRO B NATRIURETIC PEPTIDE

## 2022-10-28 NOTE — Patient Instructions (Signed)
Medication Instructions:   Your physician recommends that you continue on your current medications as directed. Please refer to the Current Medication list given to you today.   *If you need a refill on your cardiac medications before your next appointment, please call your pharmacy*   Lab Work: BMET AND BNP TODAY     If you have labs (blood work) drawn today and your tests are completely normal, you will receive your results only by: MyChart Message (if you have MyChart) OR A paper copy in the mail If you have any lab test that is abnormal or we need to change your treatment, we will call you to review the results.   Testing/Procedures:  NONE ORDERED  TODAY      Follow-Up: At [facility], you and your health needs are our priority.  As part of our continuing mission to provide you with exceptional heart care, we have created designated Provider Care Teams.  These Care Teams include your primary Cardiologist (physician) and Advanced Practice Providers (APPs -  Physician Assistants and Nurse Practitioners) who all work together to provide you with the care you need, when you need it.  We recommend signing up for the patient portal called "MyChart".  Sign up information is provided on this After Visit Summary.  MyChart is used to connect with patients for Virtual Visits (Telemedicine).  Patients are able to view lab/test results, encounter notes, upcoming appointments, etc.  Non-urgent messages can be sent to your provider as well.   To learn more about what you can do with MyChart, go to [email or web address].    Your next appointment:    6 month(s)   Provider:   Dietrich Pates, MD     Other Instructions   Low-Sodium Eating Plan Sodium, which is an element that makes up salt, helps you maintain a healthy balance of fluids in your body. Too much sodium can increase your blood pressure and cause fluid and waste to be held in your body. Your health care provider or  dietitian may recommend following this plan if you have high blood pressure (hypertension), kidney disease, liver disease, or heart failure. Eating less sodium can help lower your blood pressure, reduce swelling, and protect your heart, liver, and kidneys. What are tips for following this plan? Reading food labels The Nutrition Facts label lists the amount of sodium in one serving of the food. If you eat more than one serving, you must multiply the listed amount of sodium by the number of servings. Choose foods with less than 140 mg of sodium per serving. Avoid foods with 300 mg of sodium or more per serving. Shopping  Look for lower-sodium products, often labeled as "low-sodium" or "no salt added." Always check the sodium content, even if foods are labeled as "unsalted" or "no salt added." Buy fresh foods. Avoid canned foods and pre-made or frozen meals. Avoid canned, cured, or processed meats. Buy breads that have less than 80 mg of sodium per slice. Cooking  Eat more home-cooked food and less restaurant, buffet, and fast food. Avoid adding salt when cooking. Use salt-free seasonings or herbs instead of table salt or sea salt. Check with your health care provider or pharmacist before using salt substitutes. Cook with plant-based oils, such as canola, sunflower, or olive oil. Meal planning When eating at a restaurant, ask that your food be prepared with less salt or no salt, if possible. Avoid dishes labeled as brined, pickled, cured, smoked, or made with soy sauce, miso,  or teriyaki sauce. Avoid foods that contain MSG (monosodium glutamate). MSG is sometimes added to [place] food, bouillon, and some canned foods. Make meals that can be grilled, baked, poached, roasted, or steamed. These are generally made with less sodium. General information Most people on this plan should limit their sodium intake to 1,500-2,000 mg (milligrams) of sodium each day. What foods should I eat? Fruits Fresh,  frozen, or canned fruit. Fruit juice. Vegetables Fresh or frozen vegetables. "No salt added" canned vegetables. "No salt added" tomato sauce and paste. Low-sodium or reduced-sodium tomato and vegetable juice. Grains Low-sodium cereals, including oats, puffed wheat and rice, and shredded wheat. Low-sodium crackers. Unsalted rice. Unsalted pasta. Low-sodium bread. Whole-grain breads and whole-grain pasta. Meats and other proteins Fresh or frozen (no salt added) meat, poultry, seafood, and fish. Low-sodium canned tuna and salmon. Unsalted nuts. Dried peas, beans, and lentils without added salt. Unsalted canned beans. Eggs. Unsalted nut butters. Dairy Milk. Soy milk. Cheese that is naturally low in sodium, such as ricotta cheese, fresh mozzarella, or Swiss cheese. Low-sodium or reduced-sodium cheese. Cream cheese. Yogurt. Seasonings and condiments Fresh and dried herbs and spices. Salt-free seasonings. Low-sodium mustard and ketchup. Sodium-free salad dressing. Sodium-free light mayonnaise. Fresh or refrigerated horseradish. Lemon juice. Vinegar. Other foods Homemade, reduced-sodium, or low-sodium soups. Unsalted popcorn and pretzels. Low-salt or salt-free chips. The items listed above may not be a complete list of foods and beverages you can eat. Contact a dietitian for more information. What foods should I avoid? Vegetables Sauerkraut, pickled vegetables, and relishes. Olives. [place] fries. Onion rings. Regular canned vegetables (not low-sodium or reduced-sodium). Regular canned tomato sauce and paste (not low-sodium or reduced-sodium). Regular tomato and vegetable juice (not low-sodium or reduced-sodium). Frozen vegetables in sauces. Grains Instant hot cereals. Bread stuffing, pancake, and biscuit mixes. Croutons. Seasoned rice or pasta mixes. Noodle soup cups. Boxed or frozen macaroni and cheese. Regular salted crackers. Self-rising flour. Meats and other proteins Meat or fish that is salted,  canned, smoked, spiced, or pickled. Precooked or cured meat, such as sausages or meat loaves. Tomasa Blase. Ham. Pepperoni. Hot dogs. Corned beef. Chipped beef. Salt pork. Jerky. Pickled herring. Anchovies and sardines. Regular canned tuna. Salted nuts. Dairy Processed cheese and cheese spreads. Hard cheeses. Cheese curds. Blue cheese. Feta cheese. String cheese. Regular cottage cheese. Buttermilk. Canned milk. Fats and oils Salted butter. Regular margarine. Ghee. Bacon fat. Seasonings and condiments Onion salt, garlic salt, seasoned salt, table salt, and sea salt. Canned and packaged gravies. Worcestershire sauce. Tartar sauce. Barbecue sauce. Teriyaki sauce. Soy sauce, including reduced-sodium. Steak sauce. Fish sauce. Oyster sauce. Cocktail sauce. Horseradish that you find on the shelf. Regular ketchup and mustard. Meat flavorings and tenderizers. Bouillon cubes. Hot sauce. Pre-made or packaged marinades. Pre-made or packaged taco seasonings. Relishes. Regular salad dressings. Salsa. Other foods Salted popcorn and pretzels. Corn chips and puffs. Potato and tortilla chips. Canned or dried soups. Pizza. Frozen entrees and pot pies. The items listed above may not be a complete list of foods and beverages you should avoid. Contact a dietitian for more information. Summary Eating less sodium can help lower your blood pressure, reduce swelling, and protect your heart, liver, and kidneys. Most people on this plan should limit their sodium intake to 1,500-2,000 mg (milligrams) of sodium each day. Canned, boxed, and frozen foods are high in sodium. Restaurant foods, fast foods, and pizza are also very high in sodium. You also get sodium by adding salt to food. Try to cook at home, eat  more fresh fruits and vegetables, and eat less fast food and canned, processed, or prepared foods. This information is not intended to replace advice given to you by your health care provider. Make sure you discuss any questions you  have with your health care provider. Document Revised: 05/21/2019 Document Reviewed: 05/16/2019 Elsevier Patient Education  2023 Elsevier Inc.  Heart-Healthy Eating Plan Eating a healthy diet is important for the health of your heart. A heart-healthy eating plan includes: Eating less unhealthy fats. Eating more healthy fats. Eating less salt in your food. Salt is also called sodium. Making other changes in your diet. Talk with your doctor or a diet specialist (dietitian) to create an eating plan that is right for you. What is my plan? Your doctor may recommend an eating plan that includes: Total fat: ______% or less of total calories a day. Saturated fat: ______% or less of total calories a day. Cholesterol: less than _________mg a day. Sodium: less than _________mg a day. What are tips for following this plan? Cooking Avoid frying your food. Try to bake, boil, grill, or broil it instead. You can also reduce fat by: Removing the skin from poultry. Removing all visible fats from meats. Steaming vegetables in water or broth. Meal planning  At meals, divide your plate into four equal parts: Fill one-half of your plate with vegetables and green salads. Fill one-fourth of your plate with whole grains. Fill one-fourth of your plate with lean protein foods. Eat 2-4 cups of vegetables per day. One cup of vegetables is: 1 cup (91 g) broccoli or cauliflower florets. 2 medium carrots. 1 large bell pepper. 1 large sweet potato. 1 large tomato. 1 medium white potato. 2 cups (150 g) raw leafy greens. Eat 1-2 cups of fruit per day. One cup of fruit is: 1 small apple 1 large banana 1 cup (237 g) mixed fruit, 1 large orange,  cup (82 g) dried fruit, 1 cup (240 mL) 100% fruit juice. Eat more foods that have soluble fiber. These are apples, broccoli, carrots, beans, peas, and barley. Try to get 20-30 g of fiber per day. Eat 4-5 servings of nuts, legumes, and seeds per week: 1 serving  of dried beans or legumes equals  cup (90 g) cooked. 1 serving of nuts is  oz (12 almonds, 24 pistachios, or 7 walnut halves). 1 serving of seeds equals  oz (8 g). General information Eat more home-cooked food. Eat less restaurant, buffet, and fast food. Limit or avoid alcohol. Limit foods that are high in starch and sugar. Avoid fried foods. Lose weight if you are overweight. Keep track of how much salt (sodium) you eat. This is important if you have high blood pressure. Ask your doctor to tell you more about this. Try to add vegetarian meals each week. Fats Choose healthy fats. These include olive oil and canola oil, flaxseeds, walnuts, almonds, and seeds. Eat more omega-3 fats. These include salmon, mackerel, sardines, tuna, flaxseed oil, and ground flaxseeds. Try to eat fish at least 2 times each week. Check food labels. Avoid foods with trans fats or high amounts of saturated fat. Limit saturated fats. These are often found in animal products, such as meats, butter, and cream. These are also found in plant foods, such as palm oil, palm kernel oil, and coconut oil. Avoid foods with partially hydrogenated oils in them. These have trans fats. Examples are stick margarine, some tub margarines, cookies, crackers, and other baked goods. What foods should I eat? Fruits All fresh,  canned (in natural juice), or frozen fruits. Vegetables Fresh or frozen vegetables (raw, steamed, roasted, or grilled). Green salads. Grains Most grains. Choose whole wheat and whole grains most of the time. Rice and pasta, including brown rice and pastas made with whole wheat. Meats and other proteins Lean, well-trimmed beef, veal, pork, and lamb. Chicken and [place] without skin. All fish and shellfish. Wild duck, rabbit, pheasant, and venison. Egg whites or low-cholesterol egg substitutes. Dried beans, peas, lentils, and tofu. Seeds and most nuts. Dairy Low-fat or nonfat cheeses, including ricotta and  mozzarella. Skim or 1% milk that is liquid, powdered, or evaporated. Buttermilk that is made with low-fat milk. Nonfat or low-fat yogurt. Fats and oils Non-hydrogenated (trans-free) margarines. Vegetable oils, including soybean, sesame, sunflower, olive, peanut, safflower, corn, canola, and cottonseed. Salad dressings or mayonnaise made with a vegetable oil. Beverages Mineral water. Coffee and tea. Diet carbonated beverages. Sweets and desserts Sherbet, gelatin, and fruit ice. Small amounts of dark chocolate. Limit all sweets and desserts. Seasonings and condiments All seasonings and condiments. The items listed above may not be a complete list of foods and drinks you can eat. Contact a dietitian for more options. What foods should I avoid? Fruits Canned fruit in heavy syrup. Fruit in cream or butter sauce. Fried fruit. Limit coconut. Vegetables Vegetables cooked in cheese, cream, or butter sauce. Fried vegetables. Grains Breads that are made with saturated or trans fats, oils, or whole milk. Croissants. Sweet rolls. Donuts. High-fat crackers, such as cheese crackers. Meats and other proteins Fatty meats, such as hot dogs, ribs, sausage, bacon, rib-eye roast or steak. High-fat deli meats, such as salami and bologna. Caviar. Domestic duck and goose. Organ meats, such as liver. Dairy Cream, sour cream, cream cheese, and creamed cottage cheese. Whole-milk cheeses. Whole or 2% milk that is liquid, evaporated, or condensed. Whole buttermilk. Cream sauce or high-fat cheese sauce. Yogurt that is made from whole milk. Fats and oils Meat fat, or shortening. Cocoa butter, hydrogenated oils, palm oil, coconut oil, palm kernel oil. Solid fats and shortenings, including bacon fat, salt pork, lard, and butter. Nondairy cream substitutes. Salad dressings with cheese or sour cream. Beverages Regular sodas and juice drinks with added sugar. Sweets and desserts Frosting. Pudding. Cookies. Cakes. Pies. Milk  chocolate or white chocolate. Buttered syrups. Full-fat ice cream or ice cream drinks. The items listed above may not be a complete list of foods and drinks to avoid. Contact a dietitian for more information. Summary Heart-healthy meal planning includes eating less unhealthy fats, eating more healthy fats, and making other changes in your diet. Eat a balanced diet. This includes fruits and vegetables, low-fat or nonfat dairy, lean protein, nuts and legumes, whole grains, and heart-healthy oils and fats. This information is not intended to replace advice given to you by your health care provider. Make sure you discuss any questions you have with your health care provider. Document Revised: 07/20/2021 Document Reviewed: 07/20/2021 Elsevier Patient Education  2023 [employer].

## 2022-10-28 NOTE — Progress Notes (Signed)
Office Visit    Patient Name: [name] Date of Encounter: 10/28/2022  PCP:  Etta Grandchild, MD   [facility]  Cardiologist:  Dietrich Pates, MD  Advanced Practice Provider:  No care team member to display Electrophysiologist:  None   HPI    [name] [name] is a 77 y.o. 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  Patient was seen in the clinic March 2022.  At that time was still having some edema and it was felt to be due to venous insufficiency.  Improved with Lasix.  He was seen by Dr. Tenny Craw 2/16 and at that time was still getting short of breath with activity.  Denied chest pain.  Did endorse some occasional dizziness.  No syncope.  He did have some edema in his legs. He was hypotensive at that time and planned to hold some of his medications and follow-up in a few weeks.  Patient was seen February 2023 and at that time denied chest pain, breathing was okay.  No dizziness.  Says he was drinking fluids.  He was seen by me 09/27/21, he tells me he is doing okay.  He is having some pain in his face and needs a  "hole drilled into his head".  He is scheduled to have a trigeminal nerve decompression with Dr. Danielle Dess.  He has not had any chest pain but he does have chronic shortness of breath.  He has chronic bronchitis and sees Dr. I card.  He was taking trelegy elliptica and this really helped his shortness of breath but he stopped it after his bronchitis infection ended.  However, he is unsure if he should have stopped this medication since it was helping him and he was given 6 refills.  Sounds like he may have missed his follow-up with Dr. I card.  He plans to reach back out to them for guidance.  He tells me that he drags his feet and he has had his knees operated on.  He has had several surgeries that hold him back physically.  He thinks he might have  a pinched nerve in his neck.  He worked until he was 77 years of age and then once he retired he felt like his body fell apart.  His shoulder is the biggest problem and needs replaced.  He is able to walk around his house using a walker but would have trouble walking 1-2 blocks.  He is able to do stairs 1 stair at a time and slowly.  He has barely met the minimum of 4 METS.  Recent echocardiogram reviewed with patient.  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  He tells me he thinks his sleep apnea is gone because his tonsils and adenoids were removed but he still does have some daytime sleepiness and snores per his wife.  Today, he tells me that he hurt his shoulder at work. It is a chronic injury and he will need to replace his shoulder and elbow. He decided to hold off for now on the surgery. Medication is doing well for now (gabapentin). He does not want a hole drilled in his head. He is having some swelling and some discoloration. We discussed getting some labs today and using LE compression.  Reports  no shortness of breath nor dyspnea on exertion. Reports no chest pain, pressure, or tightness. No edema, orthopnea, PND. Reports no palpitations.    Past Medical History    Past Medical History:  Diagnosis Date   Arthritis    CAD (coronary artery disease)    cath 3/10: mLAD 30%, pRCA 30%, EF 55-60% Dr. Tenny Craw cardiologist  most recent  stress stress done ~ 2 years ago with Dr. Tenny Craw   Complication of anesthesia    "he usually gets an ileus after back OR"    Depression    Dyspnea    -PFTs compeltely normal 03/20/08 including DLc0   Environmental allergies    Dust, Smoke   GERD (gastroesophageal reflux disease)    HTN (hypertension)    Hypothyroidism    Low testosterone    Observed sleep apnea    can't wear cpap   Polycythemia    PONV (postoperative nausea and vomiting)    Pulmonary nodule 06/13/2012   Rosacea    Sleep  apnea    Most recent sleep study 2010; records at [facility]   Past Surgical History:  Procedure Laterality Date   BACK SURGERY  2005; 06/2006; 01/2007; 04/2009   CARDIAC CATHETERIZATION  08/2008   CATARACT EXTRACTION, BILATERAL  2010   CERVICAL FUSION  04/2002   ?C3-4   COLONOSCOPY  12/2005   normal screening study.    ESOPHAGOGASTRODUODENOSCOPY  multiple   last 07/2012 GERD esophagitis, 48 Fr dilation   ESOPHAGOGASTRODUODENOSCOPY N/A 09/19/2015   Procedure: ESOPHAGOGASTRODUODENOSCOPY (EGD);  Surgeon: Meryl Dare, MD;  Location: [facility] ENDOSCOPY;  Service: Endoscopy;  Laterality: N/A;   KNEE ARTHROSCOPY WITH MEDIAL MENISECTOMY Right 05/14/2015   Procedure: RIGHT KNEE ARTHROSCOPY CHONDROPLASTY, PARTIAL  MEDIAL MENISECTOMY;  Surgeon: Frederico Hamman, MD;  Location: [facility];  Service: Orthopedics;  Laterality: Right;   left knee replacement     left shoudler clean     LUMBAR DISC SURGERY  06/2006   L3   MENISCUS REPAIR  08/2008   left   NECK SURGERY     POSTERIOR FUSION LUMBAR SPINE  2005; 01/2007; 04/2009   L5; L3-4; L2-3   PROSTATE SURGERY  2000   SAVORY DILATION N/A 09/19/2015   Procedure: SAVORY DILATION;  Surgeon: Meryl Dare, MD;  Location: [facility] ENDOSCOPY;  Service: Endoscopy;  Laterality: N/A;   SHOULDER OPEN ROTATOR CUFF REPAIR Left 1999   left   TONSILLECTOMY  09/21/11   TOTAL KNEE ARTHROPLASTY Left 01/2011   left   TOTAL KNEE ARTHROPLASTY Right 08/08/2015   Procedure: RIGHT TOTAL KNEE ARTHROPLASTY;  Surgeon: Frederico Hamman, MD;  Location: [facility] OR;  Service: Orthopedics;  Laterality: Right;   TRANSURETHRAL RESECTION OF PROSTATE  2006   followed by "surgery to get rid of clots"   UVULOPALATOPHARYNGOPLASTY, TONSILLECTOMY AND SEPTOPLASTY  09/21/11   Deviated Septum    Allergies  Allergies  Allergen Reactions   Penicillins Hives and Other (See Comments)    "I break out in welts" Has patient had a PCN reaction causing immediate rash, facial/tongue/throat  swelling, SOB or lightheadedness with hypotension: No Has patient had a PCN reaction causing severe rash involving mucus membranes or skin necrosis: No Has patient had a PCN reaction that required hospitalization No Has patient had a PCN reaction occurring within the last 10 years: No If all of the above answers are "NO", then may proceed with Cephalosporin use.   Codeine Nausea And Vomiting   Sulfonamide Derivatives Nausea And Vomiting  EKGs/Labs/Other Studies Reviewed:   The following studies were reviewed today:  Echo 09/10/20  IMPRESSIONS     1. Left ventricular ejection fraction, by estimation, is 65 to 70%. The  left ventricle has normal function. The left ventricle has no regional  wall motion abnormalities. There is mild left ventricular hypertrophy.  Left ventricular diastolic parameters  were normal.   2. Right ventricular systolic function is normal. The right ventricular  size is normal. Tricuspid regurgitation signal is inadequate for assessing  PA pressure.   3. The mitral valve is grossly normal. No evidence of mitral valve  regurgitation.   4. The aortic valve was not well visualized. Aortic valve regurgitation  is not visualized. No aortic stenosis is present.   5. Aortic dilatation noted. There is mild dilatation of the ascending  aorta, measuring 41 mm. There is mild dilatation of the aortic root,  measuring 42 mm.   6. The inferior vena cava is normal in size with greater than 50%  respiratory variability, suggesting right atrial pressure of 3 mmHg.   EKG:  EKG is not ordered today.   Recent Labs: 03/17/2022: ALT 19; TSH 2.00 08/13/2022: BUN 18; Creatinine, Ser 1.02; Hemoglobin 15.4; Platelets 238; Potassium 4.3; Sodium 138  Recent Lipid Panel    Component Value Date/Time   CHOL 107 08/26/2021 0826   TRIG 97 08/26/2021 0826   HDL 32 (L) 08/26/2021 0826   CHOLHDL 3.3 08/26/2021 0826   CHOLHDL 3 09/22/2020 1151   VLDL 24.4 09/22/2020 1151   LDLCALC 56  08/26/2021 0826     Home Medications   Current Meds  Medication Sig   aspirin EC 81 MG tablet Take 1 tablet (81 mg total) by mouth 2 (two) times daily.   carbamazepine (TEGRETOL) 200 MG tablet Take 200 mg by mouth 2 (two) times daily.   cholecalciferol (VITAMIN D3) 25 MCG (1000 UNIT) tablet Take 2,000 Units by mouth daily.   DULoxetine (CYMBALTA) 30 MG capsule TAKE 1 CAPSULE BY MOUTH DAILY   Fluticasone-Umeclidin-Vilant (TRELEGY ELLIPTA) 100-62.5-25 MCG/ACT AEPB Inhale into the lungs.   furosemide (LASIX) 40 MG tablet TAKE 1 TABLET BY MOUTH DAILY   gabapentin (NEURONTIN) 300 MG capsule Take 1 capsule (300 mg total) by mouth 3 (three) times daily.   levothyroxine (SYNTHROID) 125 MCG tablet TAKE 1 TABLET BY MOUTH DAILY   losartan-hydrochlorothiazide (HYZAAR) 100-25 MG tablet TAKE ONE-HALF TABLET BY MOUTH  DAILY   metoprolol succinate (TOPROL-XL) 25 MG 24 hr tablet TAKE 1 TABLET BY MOUTH DAILY   Omega-3 Fatty Acids (SUPER OMEGA-3) 1000 MG CAPS Take by mouth.   omeprazole (PRILOSEC) 40 MG capsule TAKE 1 CAPSULE BY MOUTH TWICE  DAILY   polyethylene glycol (MIRALAX / GLYCOLAX) 17 g packet Take 17 g by mouth daily.   potassium chloride SA (KLOR-CON M) 20 MEQ tablet TAKE 1 TABLET BY MOUTH DAILY   simvastatin (ZOCOR) 20 MG tablet TAKE 1 TABLET BY MOUTH DAILY AT  6 PM.     Review of Systems      All other systems reviewed and are otherwise negative except as noted above.  Physical Exam    VS:  BP 120/72   Pulse 70   Ht 5\' 10"  (1.778 m)   Wt 257 lb 3.2 oz (116.7 kg)   SpO2 96%   BMI 36.90 kg/m  , BMI Body mass index is 36.9 kg/m.  Wt Readings from Last 3 Encounters:  10/28/22 257 lb 3.2 oz (116.7 kg)  10/12/22 257 lb (116.6  kg)  09/28/22 257 lb 6.4 oz (116.8 kg)     GEN: Well nourished, well developed, in no acute distress. HEENT: normal. Neck: Supple, no JVD, carotid bruits, or masses. Cardiac: RRR, no murmurs, rubs, or gallops. No clubbing, cyanosis, 1+ edema bilaterally.   Radials/PT 2+ and equal bilaterally.  Respiratory:  Respirations regular and unlabored, clear to auscultation bilaterally. GI: Soft, nontender, nondistended. MS: No deformity or atrophy. Skin: Warm and dry, no rash. Venous stasis changed on his LE Neuro:  Strength and sensation are intact. Psych: Normal affect.  Assessment & Plan   Hypertension -Hypotensive when he was seen a month ago by Dr. Tenny Craw.  Today 122/78 -Continue Hyzaar 50-12.5 mg daily and metoprolol succinate 25 mg daily -Continue to trend blood pressure at home -Important to keep blood pressure low normal due to aortic aneurysm  CAD -No recent chest pain but he has chronic shortness of breath -Most recent echocardiogram reviewed from March 2022 with normal systolic and diastolic function, no significant valvular disease  Lower extremity edema/chronic SOB -BNP and BMP today -This is chronic and Dr. Tenny Craw started him on Lasix 40 mg daily -He was encouraged to wear lower extremity compression although patient has a tough time getting these on -Elevate feet when able -Maintain a low-sodium diet -follow-up with Dr. Tonia Brooms   Hyperlipidemia -Recent lab work includes LDL 56, HDL 32, total cholesterol 161, triglycerides 97 -He is due for repeat lipid panel and can get this done either at his PCP or with [name] when he comes in next       Disposition: Follow up 6 months with Dietrich Pates, MD or APP.  Signed, Sharlene Dory, PA-C 10/28/2022, 8:39 AM [facility]

## 2022-10-29 LAB — BASIC METABOLIC PANEL
BUN/Creatinine Ratio: 22 (ref 10–24)
BUN: 22 mg/dL (ref 8–27)
CO2: 25 mmol/L (ref 20–29)
Calcium: 9.8 mg/dL (ref 8.6–10.2)
Chloride: 99 mmol/L (ref 96–106)
Creatinine, Ser: 0.99 mg/dL (ref 0.76–1.27)
Glucose: 88 mg/dL (ref 70–99)
Potassium: 5.1 mmol/L (ref 3.5–5.2)
Sodium: 138 mmol/L (ref 134–144)
eGFR: 79 mL/min/{1.73_m2} (ref >59)

## 2022-11-06 ENCOUNTER — Other Ambulatory Visit: Payer: Self-pay | Admitting: Internal Medicine

## 2022-11-06 DIAGNOSIS — E039 Hypothyroidism, unspecified: Secondary | ICD-10-CM

## 2022-12-31 ENCOUNTER — Other Ambulatory Visit: Payer: Self-pay | Admitting: Internal Medicine

## 2022-12-31 DIAGNOSIS — E039 Hypothyroidism, unspecified: Secondary | ICD-10-CM

## 2022-12-31 MED ORDER — LEVOTHYROXINE SODIUM 125 MCG PO TABS: 125.0000 ug | ORAL_TABLET | Freq: Every day | ORAL | 0 refills | Status: DC

## 2023-02-03 ENCOUNTER — Ambulatory Visit: Payer: Medicare Other | Admitting: Internal Medicine

## 2023-02-10 ENCOUNTER — Encounter (INDEPENDENT_AMBULATORY_CARE_PROVIDER_SITE_OTHER): Payer: Self-pay

## 2023-03-03 ENCOUNTER — Ambulatory Visit: Payer: Medicare Other | Admitting: Internal Medicine

## 2023-03-03 ENCOUNTER — Encounter: Payer: Self-pay | Admitting: Internal Medicine

## 2023-03-03 VITALS — BP 124/62 | HR 71 | Temp 98.3°F | Ht 70.0 in | Wt 255.0 lb

## 2023-03-03 DIAGNOSIS — E039 Hypothyroidism, unspecified: Secondary | ICD-10-CM

## 2023-03-03 DIAGNOSIS — E785 Hyperlipidemia, unspecified: Secondary | ICD-10-CM

## 2023-03-03 DIAGNOSIS — R7303 Prediabetes: Secondary | ICD-10-CM

## 2023-03-03 DIAGNOSIS — I700 Atherosclerosis of aorta: Secondary | ICD-10-CM | POA: Diagnosis not present

## 2023-03-03 DIAGNOSIS — Z0000 Encounter for general adult medical examination without abnormal findings: Secondary | ICD-10-CM

## 2023-03-03 DIAGNOSIS — Z0001 Encounter for general adult medical examination with abnormal findings: Secondary | ICD-10-CM

## 2023-03-03 DIAGNOSIS — Z23 Encounter for immunization: Secondary | ICD-10-CM

## 2023-03-03 DIAGNOSIS — I77810 Thoracic aortic ectasia: Secondary | ICD-10-CM

## 2023-03-03 DIAGNOSIS — I10 Essential (primary) hypertension: Secondary | ICD-10-CM | POA: Diagnosis not present

## 2023-03-03 DIAGNOSIS — K2210 Ulcer of esophagus without bleeding: Secondary | ICD-10-CM

## 2023-03-03 LAB — LIPID PANEL
Cholesterol: 120 mg/dL (ref 0–200)
HDL: 35 mg/dL — ABNORMAL LOW (ref >39.00)
LDL Cholesterol: 55 mg/dL (ref 0–99)
NonHDL: 84.59
Total CHOL/HDL Ratio: 3
Triglycerides: 146 mg/dL (ref 0.0–149.0)
VLDL: 29.2 mg/dL (ref 0.0–40.0)

## 2023-03-03 LAB — BASIC METABOLIC PANEL
BUN: 19 mg/dL (ref 6–23)
CO2: 26 meq/L (ref 19–32)
Calcium: 9.5 mg/dL (ref 8.4–10.5)
Chloride: 103 meq/L (ref 96–112)
Creatinine, Ser: 0.82 mg/dL (ref 0.40–1.50)
GFR: 85.55 mL/min (ref >60.00)
Glucose, Bld: 112 mg/dL — ABNORMAL HIGH (ref 70–99)
Potassium: 4.1 meq/L (ref 3.5–5.1)
Sodium: 136 meq/L (ref 135–145)

## 2023-03-03 LAB — URINALYSIS, ROUTINE W REFLEX MICROSCOPIC
Bilirubin Urine: NEGATIVE
Hgb urine dipstick: NEGATIVE
Ketones, ur: NEGATIVE
Leukocytes,Ua: NEGATIVE
Nitrite: NEGATIVE
RBC / HPF: NONE SEEN (ref 0–?)
Specific Gravity, Urine: 1.02 (ref 1.000–1.030)
Total Protein, Urine: NEGATIVE
Urine Glucose: NEGATIVE
Urobilinogen, UA: 1 (ref 0.0–1.0)
pH: 6 (ref 5.0–8.0)

## 2023-03-03 LAB — HEPATIC FUNCTION PANEL
ALT: 19 U/L (ref 0–53)
AST: 19 U/L (ref 0–37)
Albumin: 4.1 g/dL (ref 3.5–5.2)
Alkaline Phosphatase: 69 U/L (ref 39–117)
Bilirubin, Direct: 0.1 mg/dL (ref 0.0–0.3)
Total Bilirubin: 0.6 mg/dL (ref 0.2–1.2)
Total Protein: 8.3 g/dL (ref 6.0–8.3)

## 2023-03-03 LAB — TSH: TSH: 2.08 u[IU]/mL (ref 0.35–5.50)

## 2023-03-03 LAB — HEMOGLOBIN A1C: Hgb A1c MFr Bld: 6 % (ref 4.6–6.5)

## 2023-03-03 NOTE — Progress Notes (Signed)
Subjective:  Patient ID: [name] [name], male    DOB: [birth year]  Age: 77 y.o. MRN: [id number]  CC: Annual Exam, Coronary Artery Disease, Hypertension, Hypothyroidism, and Hyperlipidemia   HPI [name] [name] presents for a CPX and f/up ---  Discussed the use of AI scribe software for clinical note transcription with the patient, who gave verbal consent to proceed.  History of Present Illness   The patient presents with a chief complaint of shortness of breath, which he attributes to a condition requiring neurosurgical intervention. He reports that the right side of his face occasionally goes numb, a symptom that has been somewhat alleviated by an unspecified medication. The shortness of breath is particularly noticeable during exertion, such as walking. This symptom is not new and has been present since at least May.  In addition to these symptoms, the patient has a chronic productive cough with clear mucus expectoration. He was previously prescribed Trelegy for this issue but discontinued it after six months due to a lack of improvement. There is no report of hemoptysis.  The patient also reports occasional constipation, which he manages with Miralax as needed. He is on thyroid medication but does not specify any related symptoms. Urination is reported as "a little lighter than usual," but it is not a significant concern for the patient.  The patient has a history of nodules on the lungs and is scheduled for a follow-up with a cardiologist in November. He recently received a flu shot and his vaccinations are reported to be up to date.       Outpatient Medications Prior to Visit  Medication Sig Dispense Refill   aspirin EC 81 MG tablet Take 1 tablet (81 mg total) by mouth 2 (two) times daily. 30 tablet    carbamazepine (TEGRETOL) 200 MG tablet Take 200 mg by mouth 2 (two) times daily.     cholecalciferol (VITAMIN D3) 25 MCG (1000 UNIT) tablet Take 2,000 Units by mouth daily.     DULoxetine  (CYMBALTA) 30 MG capsule TAKE 1 CAPSULE BY MOUTH DAILY 90 capsule 0   furosemide (LASIX) 40 MG tablet TAKE 1 TABLET BY MOUTH DAILY 90 tablet 3   gabapentin (NEURONTIN) 300 MG capsule Take 1 capsule (300 mg total) by mouth 3 (three) times daily. 270 capsule 3   metoprolol succinate (TOPROL-XL) 25 MG 24 hr tablet TAKE 1 TABLET BY MOUTH DAILY 90 tablet 3   Omega-3 Fatty Acids (SUPER OMEGA-3) 1000 MG CAPS Take by mouth.     polyethylene glycol (MIRALAX / GLYCOLAX) 17 g packet Take 17 g by mouth daily.     potassium chloride SA (KLOR-CON M) 20 MEQ tablet TAKE 1 TABLET BY MOUTH DAILY 90 tablet 3   Fluticasone-Umeclidin-Vilant (TRELEGY ELLIPTA) 100-62.5-25 MCG/ACT AEPB Inhale into the lungs.     levothyroxine (SYNTHROID) 125 MCG tablet Take 1 tablet (125 mcg total) by mouth daily. 90 tablet 0   omeprazole (PRILOSEC) 40 MG capsule TAKE 1 CAPSULE BY MOUTH TWICE  DAILY 180 capsule 0   simvastatin (ZOCOR) 20 MG tablet TAKE 1 TABLET BY MOUTH DAILY AT  6 PM. 90 tablet 0   losartan-hydrochlorothiazide (HYZAAR) 100-25 MG tablet TAKE ONE-HALF TABLET BY MOUTH  DAILY 45 tablet 3   No facility-administered medications prior to visit.    ROS Review of Systems  Constitutional:  Negative for appetite change, chills, diaphoresis, fatigue and fever.  HENT:  Negative for trouble swallowing.   Respiratory:  Positive for cough and shortness of breath. Negative  for chest tightness and wheezing.   Cardiovascular:  Negative for chest pain, palpitations and leg swelling.  Gastrointestinal:  Positive for constipation. Negative for abdominal pain, diarrhea, nausea and vomiting.  Genitourinary:  Negative for difficulty urinating.  Musculoskeletal:  Positive for arthralgias, back pain and gait problem. Negative for joint swelling and myalgias.  Skin: Negative.   Neurological:  Negative for dizziness and weakness.  Hematological:  Negative for adenopathy. Does not bruise/bleed easily.  Psychiatric/Behavioral: Negative.       Objective:  BP 124/62 (BP Location: Left Arm, Patient Position: Sitting, Cuff Size: Large)   Pulse 71   Temp 98.3 F (36.8 C) (Oral)   Ht 5\' 10"  (1.778 m)   Wt 255 lb (115.7 kg)   SpO2 92%   BMI 36.59 kg/m   BP Readings from Last 3 Encounters:  03/03/23 124/62  10/28/22 120/72  09/28/22 112/70    Wt Readings from Last 3 Encounters:  03/03/23 255 lb (115.7 kg)  10/28/22 257 lb 3.2 oz (116.7 kg)  10/12/22 257 lb (116.6 kg)    Physical Exam Vitals reviewed.  Constitutional:      Appearance: Normal appearance.  HENT:     Nose: Nose normal.     Mouth/Throat:     Mouth: Mucous membranes are moist.  Eyes:     General: No scleral icterus.    Conjunctiva/sclera: Conjunctivae normal.  Cardiovascular:     Rate and Rhythm: Normal rate and regular rhythm.     Heart sounds: No murmur heard.    No gallop.  Pulmonary:     Effort: Pulmonary effort is normal.     Breath sounds: No stridor. No wheezing, rhonchi or rales.  Abdominal:     General: Abdomen is flat and protuberant.     Palpations: There is no mass.     Tenderness: There is no abdominal tenderness. There is no guarding.     Hernia: No hernia is present.  Musculoskeletal:        General: Normal range of motion.     Cervical back: Neck supple.     Right lower leg: No edema.     Left lower leg: No edema.  Lymphadenopathy:     Cervical: No cervical adenopathy.  Skin:    General: Skin is warm and dry.  Neurological:     General: No focal deficit present.     Mental Status: He is alert. Mental status is at baseline.  Psychiatric:        Mood and Affect: Mood normal.        Behavior: Behavior normal.        Thought Content: Thought content normal.        Judgment: Judgment normal.     Lab Results  Component Value Date   WBC 7.9 08/13/2022   HGB 15.4 08/13/2022   HCT 46.6 08/13/2022   PLT 238 08/13/2022   GLUCOSE 112 (H) 03/03/2023   CHOL 120 03/03/2023   TRIG 146.0 03/03/2023   HDL 35.00 (L)  03/03/2023   LDLCALC 55 03/03/2023   ALT 19 03/03/2023   AST 19 03/03/2023   NA 136 03/03/2023   K 4.1 03/03/2023   CL 103 03/03/2023   CREATININE 0.82 03/03/2023   BUN 19 03/03/2023   CO2 26 03/03/2023   TSH 2.08 03/03/2023   PSA 0.25 09/22/2021   INR 1.03 07/29/2015   HGBA1C 6.0 03/03/2023    MR Lumbar Spine W Wo Contrast  Result Date: 08/02/2022 CLINICAL DATA:  Degenerative  lumbar spinal stenosis. EXAM: MRI LUMBAR SPINE WITHOUT AND WITH CONTRAST TECHNIQUE: Multiplanar and multiecho pulse sequences of the lumbar spine were obtained without and with intravenous contrast. CONTRAST:  10 cc of vueway intravenous COMPARISON:  01/10/2014 FINDINGS: Segmentation:  Standard. Alignment:  Physiologic. Vertebrae: No fracture, evidence of discitis, or bone lesion. Minor discogenic endplate edema at W0-9. Hemangioma in the right T12 body/pedicle. Conus medullaris and cauda equina: Conus extends to the L1 level. Conus and cauda equina appear normal. Paraspinal and other soft tissues: Atrophy of intrinsic back muscles. Disc levels: T12- L1: Ventral spondylitic spurring.  No neural impingement L1-L2: Ventral spondylitic spurring. Disc narrowing and circumferential bulging. Mild facet spurring. Little change since prior. L2-L3: Solid arthrodesis with no impingement L3-L4: Solid arthrodesis with no impingement L4-L5: Solid arthrodesis with no impingement L5-S1:Solid arthrodesis with no impingement IMPRESSION: 1. Uncomplicated L2-S1 fusion with solid arthrodesis. 2. Spondylosis and degeneration at the open levels with little change since 2015. No neural compression. Electronically Signed   By: Tiburcio Pea M.D.   On: 08/02/2022 06:58   MR CERVICAL SPINE WO CONTRAST  Result Date: 08/02/2022 CLINICAL DATA:  Cervical spondylosis with myelopathy. EXAM: MRI CERVICAL SPINE WITHOUT CONTRAST TECHNIQUE: Multiplanar, multisequence MR imaging of the cervical spine was performed. No intravenous contrast was administered.  COMPARISON:  02/02/2019 FINDINGS: Alignment: Straightening of cervical lordosis. Vertebrae: No fracture, evidence of discitis, or bone lesion. Cord: Discrete T2 hyperintensity in the right cord at the level of C3-4, also seen on prior. Posterior Fossa, vertebral arteries, paraspinal tissues: Negative for perispinal mass or inflammation. Disc levels: C2-3: Ankylosis.  No neural impingement. C3-4: ACDF with solid arthrodesis. Spurring crowds the right foramen but patent bilateral canal and foramina C4-5: Bulky facet spurring with ankylosis. Disc narrowing and fusion. Bilateral foraminal narrowing is mild to moderate. Mild spinal stenosis crowding the cord C5-6: Disc narrowing with endplate and uncovertebral ridging greater on the right. Mild facet spurring. Advanced right and moderate left foraminal impingement C6-7: Disc narrowing with endplate and uncovertebral ridging. Advanced biforaminal impingement, especially on the left. Ridging effaces the ventral subarachnoid space. C7-T1:Degenerative facet spurring on the left more than right. Disc narrowing and bulging with uncovertebral ridging. Mild right foraminal stenosis. IMPRESSION: 1. Solid fusion from C2-C5.  Mild spinal stenosis at C4-5. 2. Bilateral degenerative foraminal impingement at C5-6 and  C6-7. 3. 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 Electronically Signed   By: Tiburcio Pea M.D.   On: 08/02/2022 06:55    Assessment & Plan:  Flu vaccine need -     Flu Vaccine Trivalent High Dose (Fluad)  Acquired hypothyroidism-- He is euthyroid. -     TSH; Future -     Levothyroxine Sodium; Take 1 tablet (125 mcg total) by mouth daily.  Dispense: 90 tablet; Refill: 1  Encounter for general adult medical examination with abnormal findings- Exam completed, labs reviewed, vaccines reviewed and updated, cancer screenings not indicated, pt ed material was given.   Atherosclerosis of aorta (HCC)- Risk factor modifications addressed. -     Lipid panel;  Future -     Simvastatin; Take 1 tablet (20 mg total) by mouth daily at 6 PM.  Dispense: 90 tablet; Refill: 1  Hyperlipidemia LDL goal <70- LDL goal achieved. Doing well on the statin  -     Lipid panel; Future -     Hepatic function panel; Future -     Simvastatin; Take 1 tablet (20 mg total) by mouth daily at 6 PM.  Dispense:  90 tablet; Refill: 1  Essential hypertension- His BP is well controlled. -     TSH; Future -     Urinalysis, Routine w reflex microscopic; Future -     Hepatic function panel; Future  Prediabetes -     Hemoglobin A1c; Future -     Basic metabolic panel; Future  Erosive esophagitis -     Omeprazole; Take 1 capsule (40 mg total) by mouth 2 (two) times daily.  Dispense: 90 capsule; Refill: 1  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HCC)     Follow-up: Return in about 6 months (around 08/31/2023).  Sanda Linger, MD

## 2023-03-03 NOTE — Patient Instructions (Signed)
Health Maintenance, Male Adopting a healthy lifestyle and getting preventive care are important in promoting health and wellness. Ask your health care provider about: The right schedule for you to have regular tests and exams. Things you can do on your own to prevent diseases and keep yourself healthy. What should I know about diet, weight, and exercise? Eat a healthy diet  Eat a diet that includes plenty of vegetables, fruits, low-fat dairy products, and lean protein. Do not eat a lot of foods that are high in solid fats, added sugars, or sodium. Maintain a healthy weight Body mass index (BMI) is a measurement that can be used to identify possible weight problems. It estimates body fat based on height and weight. Your health care provider can help determine your BMI and help you achieve or maintain a healthy weight. Get regular exercise Get regular exercise. This is one of the most important things you can do for your health. Most adults should: Exercise for at least 150 minutes each week. The exercise should increase your heart rate and make you sweat (moderate-intensity exercise). Do strengthening exercises at least twice a week. This is in addition to the moderate-intensity exercise. Spend less time sitting. Even light physical activity can be beneficial. Watch cholesterol and blood lipids Have your blood tested for lipids and cholesterol at 76 years of age, then have this test every 5 years. You may need to have your cholesterol levels checked more often if: Your lipid or cholesterol levels are high. You are older than 76 years of age. You are at high risk for heart disease. What should I know about cancer screening? Many types of cancers can be detected early and may often be prevented. Depending on your health history and family history, you may need to have cancer screening at various ages. This may include screening for: Colorectal cancer. Prostate cancer. Skin cancer. Lung  cancer. What should I know about heart disease, diabetes, and high blood pressure? Blood pressure and heart disease High blood pressure causes heart disease and increases the risk of stroke. This is more likely to develop in people who have high blood pressure readings or are overweight. Talk with your health care provider about your target blood pressure readings. Have your blood pressure checked: Every 3-5 years if you are 18-39 years of age. Every year if you are 40 years old or older. If you are between the ages of 65 and 75 and are a current or former smoker, ask your health care provider if you should have a one-time screening for abdominal aortic aneurysm (AAA). Diabetes Have regular diabetes screenings. This checks your fasting blood sugar level. Have the screening done: Once every three years after age 45 if you are at a normal weight and have a low risk for diabetes. More often and at a younger age if you are overweight or have a high risk for diabetes. What should I know about preventing infection? Hepatitis B If you have a higher risk for hepatitis B, you should be screened for this virus. Talk with your health care provider to find out if you are at risk for hepatitis B infection. Hepatitis C Blood testing is recommended for: Everyone born from 1945 through 1965. Anyone with known risk factors for hepatitis C. Sexually transmitted infections (STIs) You should be screened each year for STIs, including gonorrhea and chlamydia, if: You are sexually active and are younger than 76 years of age. You are older than 76 years of age and your   health care provider tells you that you are at risk for this type of infection. Your sexual activity has changed since you were last screened, and you are at increased risk for chlamydia or gonorrhea. Ask your health care provider if you are at risk. Ask your health care provider about whether you are at high risk for HIV. Your health care provider  may recommend a prescription medicine to help prevent HIV infection. If you choose to take medicine to prevent HIV, you should first get tested for HIV. You should then be tested every 3 months for as long as you are taking the medicine. Follow these instructions at home: Alcohol use Do not drink alcohol if your health care provider tells you not to drink. If you drink alcohol: Limit how much you have to 0-2 drinks a day. Know how much alcohol is in your drink. In the [place] one drink equals one 12 oz bottle of beer (355 mL), one 5 oz glass of wine (148 mL), or one 1 oz glass of hard liquor (44 mL). Lifestyle Do not use any products that contain nicotine or tobacco. These products include cigarettes, chewing tobacco, and vaping devices, such as e-cigarettes. If you need help quitting, ask your health care provider. Do not use street drugs. Do not share needles. Ask your health care provider for help if you need support or information about quitting drugs. General instructions Schedule regular health, dental, and eye exams. Stay current with your vaccines. Tell your health care provider if: You often feel depressed. You have ever been abused or do not feel safe at home. Summary Adopting a healthy lifestyle and getting preventive care are important in promoting health and wellness. Follow your health care provider's instructions about healthy diet, exercising, and getting tested or screened for diseases. Follow your health care provider's instructions on monitoring your cholesterol and blood pressure. This information is not intended to replace advice given to you by your health care provider. Make sure you discuss any questions you have with your health care provider. Document Revised: 11/03/2020 Document Reviewed: 11/03/2020 Elsevier Patient Education  2024 [employer]vier Inc.  

## 2023-03-04 MED ORDER — LEVOTHYROXINE SODIUM 125 MCG PO TABS
125.0000 ug | ORAL_TABLET | Freq: Every day | ORAL | 1 refills | Status: DC
Start: 2023-03-04 — End: 2023-08-29

## 2023-03-04 MED ORDER — OMEPRAZOLE 40 MG PO CPDR
40.0000 mg | DELAYED_RELEASE_CAPSULE | Freq: Every day | ORAL | 1 refills | Status: DC
Start: 2023-03-04 — End: 2023-08-29

## 2023-03-04 MED ORDER — OMEPRAZOLE 40 MG PO CPDR
40.0000 mg | DELAYED_RELEASE_CAPSULE | Freq: Two times a day (BID) | ORAL | 1 refills | Status: DC
Start: 2023-03-04 — End: 2023-03-04

## 2023-03-04 MED ORDER — SIMVASTATIN 20 MG PO TABS
20.0000 mg | ORAL_TABLET | Freq: Every day | ORAL | 1 refills | Status: DC
Start: 2023-03-04 — End: 2023-08-29

## 2023-04-04 ENCOUNTER — Telehealth: Payer: Self-pay | Admitting: Internal Medicine

## 2023-04-04 NOTE — Telephone Encounter (Signed)
A representative from [employer] called and said the manufacturer for patient's levothyroxine is changing to Lupin. They would like to get provider approval for this change. They said they have already gotten approval from the patient. Best callback is [phone]. Reference number is [id number].

## 2023-04-04 NOTE — Telephone Encounter (Signed)
Verbal ok given to change manufacturer

## 2023-04-25 NOTE — Progress Notes (Unsigned)
Cardiology Office Note   Date:  05/01/2023   ID:  [name] [name], DOB [birth year], MRN [id number]  PCP:  Etta Grandchild, MD  Cardiologist:   [name] Pates, MD   F/U of CAD     History of Present Illness: [name] [name] is a 77 y.o. 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  Meds held    He was subsequently seen by Margaretha Glassing in May 2024  Blood pressure  was better     The pt says one of his meds makes him dizzy  ? Tegretol  He denies syncope or severe presyncope   No CP   Breathing is stable   No palpitations    Current Meds  Medication Sig   aspirin EC 81 MG tablet Take 1 tablet (81 mg total) by mouth 2 (two) times daily.   carbamazepine (TEGRETOL) 200 MG tablet Take 200 mg by mouth 2 (two) times daily.   DULoxetine (CYMBALTA) 30 MG capsule TAKE 1 CAPSULE BY MOUTH DAILY   furosemide (LASIX) 40 MG tablet TAKE 1 TABLET BY MOUTH DAILY   gabapentin (NEURONTIN) 300 MG capsule Take 1 capsule (300 mg total) by mouth 3 (three) times daily.   levothyroxine (SYNTHROID) 125 MCG tablet Take 1 tablet (125 mcg total) by mouth daily.   metoprolol succinate (TOPROL-XL) 25 MG 24 hr tablet TAKE 1 TABLET BY MOUTH DAILY   Omega-3 Fatty Acids (SUPER OMEGA-3) 1000 MG CAPS Take by mouth.   omeprazole (PRILOSEC) 40 MG capsule Take 1 capsule (40 mg total) by mouth daily.   polyethylene glycol (MIRALAX / GLYCOLAX) 17 g packet Take 17 g by mouth daily.   potassium chloride SA (KLOR-CON M) 20 MEQ tablet TAKE 1 TABLET BY MOUTH DAILY   simvastatin (ZOCOR) 20 MG tablet Take 1 tablet (20 mg total) by mouth daily at 6 PM.     Allergies:   Penicillins, Codeine, and Sulfonamide derivatives   Past Medical History:  Diagnosis Date   Arthritis    CAD (coronary artery disease)    cath 3/10: mLAD 30%, pRCA 30%, EF 55-60% Dr. Tenny Craw cardiologist  most recent  stress stress done ~ 2  years ago with Dr. Tenny Craw   Complication of anesthesia    "he usually gets an ileus after back OR"    Depression    Dyspnea    -PFTs compeltely normal 03/20/08 including DLc0   Environmental allergies    Dust, Smoke   GERD (gastroesophageal reflux disease)    HTN (hypertension)    Hypothyroidism    Low testosterone    Observed sleep apnea    can't wear cpap   Polycythemia    PONV (postoperative nausea and vomiting)    Pulmonary nodule 06/13/2012   Rosacea    Sleep apnea    Most recent sleep study 2010; records [facility]    Past Surgical History:  Procedure Laterality Date   BACK SURGERY  2005; 06/2006; 01/2007; 04/2009   CARDIAC CATHETERIZATION  08/2008   CATARACT EXTRACTION, BILATERAL  2010   CERVICAL FUSION  04/2002   ?C3-4   COLONOSCOPY  12/2005   normal screening study.    ESOPHAGOGASTRODUODENOSCOPY  multiple   last 07/2012 GERD esophagitis, 48 Fr dilation   ESOPHAGOGASTRODUODENOSCOPY N/A 09/19/2015   Procedure: ESOPHAGOGASTRODUODENOSCOPY (  EGD);  Surgeon: Meryl Dare, MD;  [facility] END[facility]COPY;  Service: Endoscopy;  Laterality: N/A;   KNEE ARTHROSCOPY WITH MEDIAL MENISECTOMY Right 05/14/2015   Procedure: RIGHT KNEE ARTHROSCOPY CHONDROPLASTY, PARTIAL  MEDIAL MENISECTOMY;  Surgeon: Frederico Hamman, MD;  Location: [facility];  Service: Orthopedics;  Laterality: Right;   left knee replacement     left shoudler clean     LUMBAR DISC SURGERY  06/2006   L3   MENISCUS REPAIR  08/2008   left   NECK SURGERY     POSTERIOR FUSION LUMBAR SPINE  2005; 01/2007; 04/2009   L5; L3-4; L2-3   PROSTATE SURGERY  2000   SAVORY DILATION N/A 09/19/2015   Procedure: SAVORY DILATION;  Surgeon: Meryl Dare, MD;  Location: [facility] ENDOSCOPY;  Service: Endoscopy;  Laterality: N/A;   SHOULDER OPEN ROTATOR CUFF REPAIR Left 1999   left   TONSILLECTOMY  09/21/11   TOTAL KNEE ARTHROPLASTY Left 01/2011   left   TOTAL KNEE ARTHROPLASTY Right 08/08/2015   Procedure: RIGHT TOTAL  KNEE ARTHROPLASTY;  Surgeon: Frederico Hamman, MD;  Location[facility] OR;  Service: Orthopedics;  Laterality: Right;   TRANSURETHRAL RESECTION OF PROSTATE  2006   followed by "surgery to get rid of clots"   UVULOPALATOPHARYNGOPLASTY, TONSILLECTOMY AND SEPTOPLASTY  09/21/11   Deviated Septum     Social History:  The patient  reports that he quit smoking about 48 years ago. His smoking use included cigarettes. He started smoking about 64 years ago. He has a 16 pack-year smoking history. He has quit using smokeless tobacco. He reports that he does not drink alcohol and does not use drugs.   Family History:  The patient's family history includes Alzheimer's disease in his cousin and father; Bipolar disorder in an other family member; Breast cancer in his mother and sister; Breast cancer (age of onset: 72) in his sister; Cirrhosis in his maternal uncle; Colon polyps in his mother; Diabetes in his mother and sister; Heart Problems in his sister; Heart attack in his cousin, father, and paternal grandfather; Liver cancer in his maternal uncle; Lung cancer in his paternal uncle, paternal uncle, and paternal uncle; Other in his daughter and sister; Parkinson's disease in an other family member; Stroke in his paternal grandmother; Throat cancer in his maternal uncle.    ROS:  Please see the history of present illness. All other systems are reviewed and  Negative to the above problem except as noted.    PHYSICAL EXAM: VS:  BP 112/82   Pulse 66   Ht 5\' 10"  (1.778 m)   Wt 260 lb 6.4 oz (118.1 kg)   SpO2 96%   BMI 37.36 kg/m     GEN: Obese 77 yo in no acute distress  HEENT: NCAT   Neck: JVP is not elevated  Cardiac: RRR; No signif murmurs   Triv  LE edema   Respiratory:  CTA GI: Obese. Nontender. MS: No deformity      EKG:  EKG not done today     Component Value Date/Time   CHOL 120 03/03/2023 0925   CHOL 107 08/26/2021 0826   TRIG 146.0 03/03/2023 0925   HDL 35.00 (L) 03/03/2023 0925   HDL 32 (L)  08/26/2021 0826   CHOLHDL 3 03/03/2023 0925   VLDL 29.2 03/03/2023 0925   LDLCALC 55 03/03/2023 0925   LDLCALC 56 08/26/2021 0826      Wt Readings from Last 3 Encounters:  04/29/23 260 lb 6.4 oz (118.1 kg)  03/03/23 255 lb (115.7 kg)  10/28/22 257 lb 3.2 oz (116.7 kg)      ASSESSMENT AND PLAN:  1 HTN   The pt is doing better than when I saw him earlier this year    Reading is better    He has some dizziness   May be related to Tegretol Duscuss with PCP   2   CAD  Mild dz on CT coronary angiogram in March 2023   NO symptoms of angina  3  Lipids.  Continue statin LDL 55   HDL 35      Follow up in June   Current medicines are reviewed at length with the patient today.  The patient does not have concerns regarding medicines.  Signed, [name] Pates, MD  05/01/2023 8:19 PM    [facility] [address] [place], [place], [place]  [address] Phone: [phone]; Fax: [phone]

## 2023-04-29 ENCOUNTER — Encounter: Payer: Self-pay | Admitting: Internal Medicine

## 2023-04-29 ENCOUNTER — Ambulatory Visit: Payer: Medicare Other | Attending: Internal Medicine | Admitting: Internal Medicine

## 2023-04-29 VITALS — BP 112/82 | HR 66 | Ht 70.0 in | Wt 260.4 lb

## 2023-04-29 DIAGNOSIS — I10 Essential (primary) hypertension: Secondary | ICD-10-CM | POA: Diagnosis not present

## 2023-04-29 NOTE — Patient Instructions (Signed)
Medication Instructions:  Your physician recommends that you continue on your current medications as directed. Please refer to the Current Medication list given to you today.  *If you need a refill on your cardiac medications before your next appointment, please call your pharmacy*   Lab Work: none If you have labs (blood work) drawn today and your tests are completely normal, you will receive your results only by: MyChart Message (if you have MyChart) OR A paper copy in the mail If you have any lab test that is abnormal or we need to change your treatment, we will call you to review the results.   Testing/Procedures: none   Follow-Up: At [facility], you and your health needs are our priority.  As part of our continuing mission to provide you with exceptional heart care, we have created designated Provider Care Teams.  These Care Teams include your primary Cardiologist (physician) and Advanced Practice Providers (APPs -  Physician Assistants and Nurse Practitioners) who all work together to provide you with the care you need, when you need it.  We recommend signing up for the patient portal called "MyChart".  Sign up information is provided on this After Visit Summary.  MyChart is used to connect with patients for Virtual Visits (Telemedicine).  Patients are able to view lab/test results, encounter notes, upcoming appointments, etc.  Non-urgent messages can be sent to your provider as well.   To learn more about what you can do with MyChart, go to [email or web address].    Your next appointment:   7 month(s)  Provider:   Dietrich Pates, MD     Other Instructions

## 2023-07-29 ENCOUNTER — Other Ambulatory Visit: Payer: Self-pay | Admitting: Internal Medicine

## 2023-08-18 ENCOUNTER — Other Ambulatory Visit: Payer: Self-pay | Admitting: Internal Medicine

## 2023-08-21 ENCOUNTER — Other Ambulatory Visit: Payer: Self-pay | Admitting: Internal Medicine

## 2023-08-27 ENCOUNTER — Other Ambulatory Visit: Payer: Self-pay | Admitting: Internal Medicine

## 2023-08-27 DIAGNOSIS — K2210 Ulcer of esophagus without bleeding: Secondary | ICD-10-CM

## 2023-08-27 DIAGNOSIS — E039 Hypothyroidism, unspecified: Secondary | ICD-10-CM

## 2023-08-27 DIAGNOSIS — E785 Hyperlipidemia, unspecified: Secondary | ICD-10-CM

## 2023-08-27 DIAGNOSIS — I700 Atherosclerosis of aorta: Secondary | ICD-10-CM

## 2023-08-31 ENCOUNTER — Ambulatory Visit: Payer: Medicare Other | Admitting: Internal Medicine

## 2023-10-04 ENCOUNTER — Encounter: Payer: Self-pay | Admitting: Internal Medicine

## 2023-10-04 ENCOUNTER — Ambulatory Visit: Admitting: Internal Medicine

## 2023-10-04 ENCOUNTER — Other Ambulatory Visit: Payer: Self-pay

## 2023-10-04 ENCOUNTER — Encounter (HOSPITAL_COMMUNITY): Payer: Self-pay | Admitting: Emergency Medicine

## 2023-10-04 ENCOUNTER — Emergency Department (HOSPITAL_COMMUNITY)

## 2023-10-04 ENCOUNTER — Telehealth: Payer: Self-pay

## 2023-10-04 ENCOUNTER — Emergency Department (HOSPITAL_BASED_OUTPATIENT_CLINIC_OR_DEPARTMENT_OTHER)

## 2023-10-04 ENCOUNTER — Emergency Department (HOSPITAL_COMMUNITY)
Admission: EM | Admit: 2023-10-04 | Discharge: 2023-10-04 | Disposition: A | Discharge status: Home / Self Care | Attending: Emergency Medicine | Admitting: Emergency Medicine

## 2023-10-04 VITALS — BP 132/66 | HR 74 | Temp 98.2°F | Resp 16 | Ht 70.0 in | Wt 264.0 lb

## 2023-10-04 DIAGNOSIS — I10 Essential (primary) hypertension: Secondary | ICD-10-CM | POA: Insufficient documentation

## 2023-10-04 DIAGNOSIS — Z7982 Long term (current) use of aspirin: Secondary | ICD-10-CM | POA: Diagnosis not present

## 2023-10-04 DIAGNOSIS — R7989 Other specified abnormal findings of blood chemistry: Secondary | ICD-10-CM | POA: Diagnosis not present

## 2023-10-04 DIAGNOSIS — M79661 Pain in right lower leg: Secondary | ICD-10-CM

## 2023-10-04 DIAGNOSIS — E538 Deficiency of other specified B group vitamins: Secondary | ICD-10-CM

## 2023-10-04 DIAGNOSIS — I2510 Atherosclerotic heart disease of native coronary artery without angina pectoris: Secondary | ICD-10-CM | POA: Diagnosis not present

## 2023-10-04 DIAGNOSIS — E039 Hypothyroidism, unspecified: Secondary | ICD-10-CM | POA: Diagnosis not present

## 2023-10-04 DIAGNOSIS — R600 Localized edema: Secondary | ICD-10-CM | POA: Insufficient documentation

## 2023-10-04 DIAGNOSIS — L03115 Cellulitis of right lower limb: Secondary | ICD-10-CM | POA: Diagnosis not present

## 2023-10-04 DIAGNOSIS — G500 Trigeminal neuralgia: Secondary | ICD-10-CM

## 2023-10-04 DIAGNOSIS — R0609 Other forms of dyspnea: Secondary | ICD-10-CM | POA: Insufficient documentation

## 2023-10-04 DIAGNOSIS — R7303 Prediabetes: Secondary | ICD-10-CM | POA: Diagnosis not present

## 2023-10-04 DIAGNOSIS — Z79899 Other long term (current) drug therapy: Secondary | ICD-10-CM | POA: Diagnosis not present

## 2023-10-04 DIAGNOSIS — R0602 Shortness of breath: Secondary | ICD-10-CM | POA: Diagnosis not present

## 2023-10-04 DIAGNOSIS — M48061 Spinal stenosis, lumbar region without neurogenic claudication: Secondary | ICD-10-CM | POA: Insufficient documentation

## 2023-10-04 LAB — BASIC METABOLIC PANEL WITH GFR
Anion gap: 10 (ref 5–15)
BUN: 23 mg/dL (ref 6–23)
BUN: 20 mg/dL (ref 8–23)
CO2: 24 meq/L (ref 19–32)
CO2: 24 mmol/L (ref 22–32)
Calcium: 9.9 mg/dL (ref 8.4–10.5)
Calcium: 9.5 mg/dL (ref 8.9–10.3)
Chloride: 101 meq/L (ref 96–112)
Chloride: 101 mmol/L (ref 98–111)
Creatinine, Ser: 0.83 mg/dL (ref 0.40–1.50)
Creatinine, Ser: 0.96 mg/dL (ref 0.61–1.24)
GFR, Estimated: >60 mL/min (ref >60)
GFR: 84.88 mL/min (ref >60.00)
Glucose, Bld: 96 mg/dL (ref 70–99)
Glucose, Bld: 99 mg/dL (ref 70–99)
Potassium: 4.5 meq/L (ref 3.5–5.1)
Potassium: 3.9 mmol/L (ref 3.5–5.1)
Sodium: 137 meq/L (ref 135–145)
Sodium: 135 mmol/L (ref 135–145)

## 2023-10-04 LAB — CBC
HCT: 46.1 % (ref 39.0–52.0)
Hemoglobin: 15.1 g/dL (ref 13.0–17.0)
MCH: 31.5 pg (ref 26.0–34.0)
MCHC: 32.8 g/dL (ref 30.0–36.0)
MCV: 96.2 fL (ref 80.0–100.0)
Platelets: 229 10*3/uL (ref 150–400)
RBC: 4.79 MIL/uL (ref 4.22–5.81)
RDW: 13.2 % (ref 11.5–15.5)
WBC: 8.4 10*3/uL (ref 4.0–10.5)
nRBC: 0 % (ref 0.0–0.2)

## 2023-10-04 LAB — CBC WITH DIFFERENTIAL/PLATELET
Basophils Absolute: 0.1 10*3/uL (ref 0.0–0.1)
Basophils Relative: 0.8 % (ref 0.0–3.0)
Eosinophils Absolute: 0.1 10*3/uL (ref 0.0–0.7)
Eosinophils Relative: 2.1 % (ref 0.0–5.0)
HCT: 45.7 % (ref 39.0–52.0)
Hemoglobin: 15.1 g/dL (ref 13.0–17.0)
Lymphocytes Relative: 20.8 % (ref 12.0–46.0)
Lymphs Abs: 1.5 10*3/uL (ref 0.7–4.0)
MCHC: 33.1 g/dL (ref 30.0–36.0)
MCV: 95.9 fl (ref 78.0–100.0)
Monocytes Absolute: 0.7 10*3/uL (ref 0.1–1.0)
Monocytes Relative: 10 % (ref 3.0–12.0)
Neutro Abs: 4.8 10*3/uL (ref 1.4–7.7)
Neutrophils Relative %: 66.3 % (ref 43.0–77.0)
Platelets: 229 10*3/uL (ref 150.0–400.0)
RBC: 4.77 Mil/uL (ref 4.22–5.81)
RDW: 13.8 % (ref 11.5–15.5)
WBC: 7.2 10*3/uL (ref 4.0–10.5)

## 2023-10-04 LAB — TROPONIN I (HIGH SENSITIVITY)
High Sens Troponin I: 25 ng/L (ref 2–17)
Troponin I (High Sensitivity): 25 ng/L — ABNORMAL HIGH (ref <18)
Troponin I (High Sensitivity): 26 ng/L — ABNORMAL HIGH (ref <18)

## 2023-10-04 LAB — HEPATIC FUNCTION PANEL
ALT: 15 U/L (ref 0–53)
AST: 19 U/L (ref 0–37)
Albumin: 4.4 g/dL (ref 3.5–5.2)
Alkaline Phosphatase: 85 U/L (ref 39–117)
Bilirubin, Direct: 0.2 mg/dL (ref 0.0–0.3)
Total Bilirubin: 0.8 mg/dL (ref 0.2–1.2)
Total Protein: 8.6 g/dL — ABNORMAL HIGH (ref 6.0–8.3)

## 2023-10-04 LAB — URINALYSIS, ROUTINE W REFLEX MICROSCOPIC
Bilirubin Urine: NEGATIVE
Hgb urine dipstick: NEGATIVE
Ketones, ur: NEGATIVE
Leukocytes,Ua: NEGATIVE
Nitrite: NEGATIVE
RBC / HPF: NONE SEEN (ref 0–?)
Specific Gravity, Urine: 1.02 (ref 1.000–1.030)
Urine Glucose: NEGATIVE
Urobilinogen, UA: 1 (ref 0.0–1.0)
pH: 6 (ref 5.0–8.0)

## 2023-10-04 LAB — D-DIMER, QUANTITATIVE: D-Dimer, Quant: 1.7 ug{FEU}/mL — ABNORMAL HIGH (ref <0.50)

## 2023-10-04 LAB — BRAIN NATRIURETIC PEPTIDE: Pro B Natriuretic peptide (BNP): 32 pg/mL (ref 0.0–100.0)

## 2023-10-04 LAB — HEMOGLOBIN A1C: Hgb A1c MFr Bld: 6.1 % (ref 4.6–6.5)

## 2023-10-04 MED ORDER — GABAPENTIN 300 MG PO CAPS: 300.0000 mg | ORAL_CAPSULE | Freq: Three times a day (TID) | ORAL | 1 refills | Status: DC

## 2023-10-04 MED ORDER — CEPHALEXIN 500 MG PO CAPS: 500.0000 mg | ORAL_CAPSULE | Freq: Four times a day (QID) | ORAL | 0 refills | Status: AC

## 2023-10-04 MED ORDER — IOHEXOL 350 MG/ML SOLN
75.0000 mL | Freq: Once | INTRAVENOUS | Status: AC | PRN
  Administered 2023-10-04: 75 mL via INTRAVENOUS

## 2023-10-04 MED ORDER — CEPHALEXIN 250 MG PO CAPS
500.0000 mg | ORAL_CAPSULE | Freq: Once | ORAL | Status: AC
  Administered 2023-10-04: 500 mg via ORAL
  Filled 2023-10-04: qty 2

## 2023-10-04 NOTE — Progress Notes (Signed)
 Lower extremity venous duplex completed. Please see CV Procedures for preliminary results.  Shona Simpson, RVT 10/04/23 5:26 PM

## 2023-10-04 NOTE — Patient Instructions (Signed)
 Hypertension, Adult High blood pressure (hypertension) is when the force of blood pumping through the arteries is too strong. The arteries are the blood vessels that carry blood from the heart throughout the body. Hypertension forces the heart to work harder to pump blood and may cause arteries to become narrow or stiff. Untreated or uncontrolled hypertension can lead to a heart attack, heart failure, a stroke, kidney disease, and other problems. A blood pressure reading consists of a higher number over a lower number. Ideally, your blood pressure should be below 120/80. The first ("top") number is called the systolic pressure. It is a measure of the pressure in your arteries as your heart beats. The second ("bottom") number is called the diastolic pressure. It is a measure of the pressure in your arteries as the heart relaxes. What are the causes? The exact cause of this condition is not known. There are some conditions that result in high blood pressure. What increases the risk? Certain factors may make you more likely to develop high blood pressure. Some of these risk factors are under your control, including: Smoking. Not getting enough exercise or physical activity. Being overweight. Having too much fat, sugar, calories, or salt (sodium) in your diet. Drinking too much alcohol. Other risk factors include: Having a personal history of heart disease, diabetes, high cholesterol, or kidney disease. Stress. Having a family history of high blood pressure and high cholesterol. Having obstructive sleep apnea. Age. The risk increases with age. What are the signs or symptoms? High blood pressure may not cause symptoms. Very high blood pressure (hypertensive crisis) may cause: Headache. Fast or irregular heartbeats (palpitations). Shortness of breath. Nosebleed. Nausea and vomiting. Vision changes. Severe chest pain, dizziness, and seizures. How is this diagnosed? This condition is diagnosed by  measuring your blood pressure while you are seated, with your arm resting on a flat surface, your legs uncrossed, and your feet flat on the floor. The cuff of the blood pressure monitor will be placed directly against the skin of your upper arm at the level of your heart. Blood pressure should be measured at least twice using the same arm. Certain conditions can cause a difference in blood pressure between your right and left arms. If you have a high blood pressure reading during one visit or you have normal blood pressure with other risk factors, you may be asked to: Return on a different day to have your blood pressure checked again. Monitor your blood pressure at home for 1 week or longer. If you are diagnosed with hypertension, you may have other blood or imaging tests to help your health care provider understand your overall risk for other conditions. How is this treated? This condition is treated by making healthy lifestyle changes, such as eating healthy foods, exercising more, and reducing your alcohol intake. You may be referred for counseling on a healthy diet and physical activity. Your health care provider may prescribe medicine if lifestyle changes are not enough to get your blood pressure under control and if: Your systolic blood pressure is above 130. Your diastolic blood pressure is above 80. Your personal target blood pressure may vary depending on your medical conditions, your age, and other factors. Follow these instructions at home: Eating and drinking  Eat a diet that is high in fiber and potassium, and low in sodium, added sugar, and fat. An example of this eating plan is called the DASH diet. DASH stands for Dietary Approaches to Stop Hypertension. To eat this way: Eat  plenty of fresh fruits and vegetables. Try to fill one half of your plate at each meal with fruits and vegetables. Eat whole grains, such as whole-wheat pasta, brown rice, or whole-grain bread. Fill about one  fourth of your plate with whole grains. Eat or drink low-fat dairy products, such as skim milk or low-fat yogurt. Avoid fatty cuts of meat, processed or cured meats, and poultry with skin. Fill about one fourth of your plate with lean proteins, such as fish, chicken without skin, beans, eggs, or tofu. Avoid pre-made and processed foods. These tend to be higher in sodium, added sugar, and fat. Reduce your daily sodium intake. Many people with hypertension should eat less than 1,500 mg of sodium a day. Do not drink alcohol if: Your health care provider tells you not to drink. You are pregnant, may be pregnant, or are planning to become pregnant. If you drink alcohol: Limit how much you have to: 0-1 drink a day for women. 0-2 drinks a day for men. Know how much alcohol is in your drink. In the [place] one drink equals one 12 oz bottle of beer (355 mL), one 5 oz glass of wine (148 mL), or one 1 oz glass of hard liquor (44 mL). Lifestyle  Work with your health care provider to maintain a healthy body weight or to lose weight. Ask what an ideal weight is for you. Get at least 30 minutes of exercise that causes your heart to beat faster (aerobic exercise) most days of the week. Activities may include walking, swimming, or biking. Include exercise to strengthen your muscles (resistance exercise), such as Pilates or lifting weights, as part of your weekly exercise routine. Try to do these types of exercises for 30 minutes at least 3 days a week. Do not use any products that contain nicotine or tobacco. These products include cigarettes, chewing tobacco, and vaping devices, such as e-cigarettes. If you need help quitting, ask your health care provider. Monitor your blood pressure at home as told by your health care provider. Keep all follow-up visits. This is important. Medicines Take over-the-counter and prescription medicines only as told by your health care provider. Follow directions carefully. Blood  pressure medicines must be taken as prescribed. Do not skip doses of blood pressure medicine. Doing this puts you at risk for problems and can make the medicine less effective. Ask your health care provider about side effects or reactions to medicines that you should watch for. Contact a health care provider if you: Think you are having a reaction to a medicine you are taking. Have headaches that keep coming back (recurring). Feel dizzy. Have swelling in your ankles. Have trouble with your vision. Get help right away if you: Develop a severe headache or confusion. Have unusual weakness or numbness. Feel faint. Have severe pain in your chest or abdomen. Vomit repeatedly. Have trouble breathing. These symptoms may be an emergency. Get help right away. Call 911. Do not wait to see if the symptoms will go away. Do not drive yourself to the hospital. Summary Hypertension is when the force of blood pumping through your arteries is too strong. If this condition is not controlled, it may put you at risk for serious complications. Your personal target blood pressure may vary depending on your medical conditions, your age, and other factors. For most people, a normal blood pressure is less than 120/80. Hypertension is treated with lifestyle changes, medicines, or a combination of both. Lifestyle changes include losing weight, eating a healthy,  low-sodium diet, exercising more, and limiting alcohol. This information is not intended to replace advice given to you by your health care provider. Make sure you discuss any questions you have with your health care provider. Document Revised: 04/21/2021 Document Reviewed: 04/21/2021 Elsevier Patient Education  2024 [employer].

## 2023-10-04 NOTE — ED Triage Notes (Addendum)
 PT BIB EMS. Pt had PCP appt today at 1030. Pt was called at 1pm and told to come to ER for abnormal EKG and elevated labs- troponin . Pt states mild chest pain feels like indigestion.   EMS vs  162/84 76 pulse irregular with PVCs 97% Cbg 119 324 asa given   20g left hand

## 2023-10-04 NOTE — ED Provider Notes (Signed)
 [name] AT [facility] Provider Note   CSN: [id number] Arrival date & time: 10/04/23  1442     History  Chief Complaint  Patient presents with   Chest Pain    [name] [name] is a 78 y.o. male with PMHx OA, CAD, depression, GERD, HTN, hypothyroidism, OSA who presents to ED concerned for elevated troponin and d-dimer. These labs were obtained during patient's checkup PCP appointment today. Patient states that he has been having DOE for years that has seemed to progress in severity over the years - no severe acute worsening. Patient also stating that his LE have been swollen for years and he takes lasix daily for this. Patient also endorsing intermittent coughs for years as well. Patient denies any new infectious symptoms today such as fevers, nausea, vomiting, diarrhea.   Chest Pain      Home Medications Prior to Admission medications   Medication Sig Start Date End Date Taking? Authorizing Provider  cephALEXin (KEFLEX) 500 MG capsule Take 1 capsule (500 mg total) by mouth 4 (four) times daily for 5 days. 10/04/23 10/09/23 Yes Dorthy Cooler, PA-C  aspirin EC 81 MG tablet Take 1 tablet (81 mg total) by mouth 2 (two) times daily. 08/12/21   Pricilla Riffle, MD  DULoxetine (CYMBALTA) 30 MG capsule TAKE 1 CAPSULE BY MOUTH DAILY 07/29/23   Etta Grandchild, MD  furosemide (LASIX) 40 MG tablet TAKE 1 TABLET BY MOUTH DAILY 08/22/23   Pricilla Riffle, MD  gabapentin (NEURONTIN) 300 MG capsule Take 1 capsule (300 mg total) by mouth 3 (three) times daily. 10/04/23   Etta Grandchild, MD  levothyroxine (SYNTHROID) 125 MCG tablet TAKE 1 TABLET BY MOUTH DAILY 08/29/23   Etta Grandchild, MD  metoprolol succinate (TOPROL-XL) 25 MG 24 hr tablet TAKE 1 TABLET BY MOUTH ONCE  DAILY 08/18/23   Sharlene Dory, PA-C  Omega-3 Fatty Acids (SUPER OMEGA-3) 1000 MG CAPS Take by mouth.    [provider]  omeprazole (PRILOSEC) 40 MG capsule TAKE 1 CAPSULE BY MOUTH DAILY 08/29/23    Etta Grandchild, MD  polyethylene glycol (MIRALAX / GLYCOLAX) 17 g packet Take 17 g by mouth daily.    [provider]  potassium chloride SA (KLOR-CON M) 20 MEQ tablet TAKE 1 TABLET BY MOUTH DAILY 08/18/23   Sharlene Dory, PA-C  simvastatin (ZOCOR) 20 MG tablet TAKE 1 TABLET BY MOUTH DAILY AT  6 PM. 08/29/23   Etta Grandchild, MD      Allergies    Penicillins, Codeine, and Sulfonamide derivatives    Review of Systems   Review of Systems  Cardiovascular:  Positive for chest pain.    Physical Exam Updated Vital Signs BP 106/88   Pulse 61   Temp 98.2 F (36.8 C)   Resp 19   Ht 5\' 10"  (1.778 m)   Wt 119 kg   SpO2 98%   BMI 37.64 kg/m  Physical Exam Vitals and nursing note reviewed.  Constitutional:      General: He is not in acute distress.    Appearance: He is not ill-appearing or toxic-appearing.  HENT:     Head: Normocephalic and atraumatic.     Mouth/Throat:     Mouth: Mucous membranes are moist.     Pharynx: No oropharyngeal exudate or posterior oropharyngeal erythema.  Eyes:     General: No scleral icterus.       Right eye: No discharge.  Left eye: No discharge.     Conjunctiva/sclera: Conjunctivae normal.  Cardiovascular:     Rate and Rhythm: Normal rate and regular rhythm.     Pulses: Normal pulses.     Heart sounds: Normal heart sounds. No murmur heard. Pulmonary:     Effort: Pulmonary effort is normal. No respiratory distress.     Breath sounds: Normal breath sounds. No wheezing, rhonchi or rales.  Abdominal:     General: Bowel sounds are normal.     Palpations: Abdomen is soft. There is no mass.     Tenderness: There is no abdominal tenderness.  Musculoskeletal:     Left lower leg: No edema.     Comments: Mild erythema with +2 pitting edema in right LE. No erythema or pitting edema in left LE.  Skin:    General: Skin is warm and dry.     Findings: No rash.  Neurological:     General: No focal deficit present.     Mental Status: He is  alert and oriented to person, place, and time. Mental status is at baseline.  Psychiatric:        Mood and Affect: Mood normal.     ED Results / Procedures / Treatments   Labs (all labs ordered are listed, but only abnormal results are displayed) Labs Reviewed  TROPONIN I (HIGH SENSITIVITY) - Abnormal; Notable for the following components:      Result Value   Troponin I (High Sensitivity) 25 (*)    All other components within normal limits  TROPONIN I (HIGH SENSITIVITY) - Abnormal; Notable for the following components:   Troponin I (High Sensitivity) 26 (*)    All other components within normal limits  BASIC METABOLIC PANEL WITH GFR  CBC    EKG EKG Interpretation Date/Time:  Tuesday October 04 2023 14:51:53 EDT Ventricular Rate:  77 PR Interval:  220 QRS Duration:  168 QT Interval:  438 QTC Calculation: 495 R Axis:   -80  Text Interpretation: Sinus rhythm with 1st degree A-V block with frequent Premature ventricular complexes Right bundle branch block Left anterior fascicular block Bifascicular block Minimal voltage criteria for LVH, may be normal variant ( R in aVL ) similar QRS morphology to 2017 Abnormal ECG Confirmed by Gerhard Munch ([id number]) on 10/04/2023 3:00:18 PM  Radiology CT Angio Chest PE W/Cm &/Or Wo Cm Result Date: 10/04/2023 CLINICAL DATA:  Abnormal EKG chest pain EXAM: CT ANGIOGRAPHY CHEST WITH CONTRAST TECHNIQUE: Multidetector CT imaging of the chest was performed using the standard protocol during bolus administration of intravenous contrast. Multiplanar CT image reconstructions and MIPs were obtained to evaluate the vascular anatomy. RADIATION DOSE REDUCTION: This exam was performed according to the departmental dose-optimization program which includes automated exposure control, adjustment of the mA and/or kV according to patient size and/or use of iterative reconstruction technique. CONTRAST:  75mL OMNIPAQUE IOHEXOL 350 MG/ML SOLN COMPARISON:  Chest x-[place]  10/04/2023, CT chest 10/04/2016 trauma CT chest 11/27/2018 FINDINGS: Cardiovascular: Satisfactory opacification of the pulmonary arteries to the segmental level. No evidence of pulmonary embolism. Mild aneurysmal dilatation of the ascending aorta up to 4.1 cm previously 4.1 cm. Coronary vascular calcification. Normal cardiac size. No pericardial effusion Mediastinum/Nodes: No enlarged mediastinal, hilar, or axillary lymph nodes. Thyroid gland, trachea, and esophagus demonstrate no significant findings. Lungs/Pleura: No acute airspace disease, pleural effusion or pneumothorax. Numerous scattered bilateral pulmonary nodules, measuring up to 6 mm in the left lower lobe on series 6, image 103, stable compared to 2020, no imaging  follow-up is recommended. No new or enlarging pulmonary nodules. 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 IMPRESSION: 1. Negative for acute pulmonary embolus. 2. No acute airspace disease. 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 Stable mild aneurysmal dilatation of the ascending aorta up to 4.1 cm. Aortic aneurysm NOS (ICD10-I71.9).Aortic Atherosclerosis (ICD10-I70.0). Electronically Signed   By: Jasmine Pang M.D.   On: 10/04/2023 20:18   VAS [name] LOWER EXTREMITY VENOUS (DVT) (7a-7p) Result Date: 10/04/2023  Lower Venous DVT Study Patient Name:  VIVIAN OKELLEY  Date of Exam:   10/04/2023 Medical Rec #: [id number]      Accession #:    [id number] Date of Birth: 11/02/1945      Patient Gender: M Patient Age:   57 years Exam Location:  [facility] Procedure:      VAS [name] LOWER EXTREMITY VENOUS (DVT) Referring Phys: [facility] Emeli Goguen --------------------------------------------------------------------------------  Indications: Pain.  Risk Factors: Obesity. Limitations: Body habitus. Comparison Study: No significant changes seen since  previous exam 09/18/15. Performing Technologist: Shona Simpson  Examination Guidelines: A complete evaluation includes B-mode imaging, spectral Doppler, color Doppler, and power Doppler as needed of all accessible portions of each vessel. Bilateral testing is considered an integral part of a complete examination. Limited examinations for reoccurring indications may be performed as noted. The reflux portion of the exam is performed with the patient in reverse Trendelenburg.  +---------+---------------+---------+-----------+----------+-------------------+ RIGHT    CompressibilityPhasicitySpontaneityPropertiesThrombus Aging      +---------+---------------+---------+-----------+----------+-------------------+ CFV      Full           Yes      Yes                                      +---------+---------------+---------+-----------+----------+-------------------+ SFJ      Full                                                             +---------+---------------+---------+-----------+----------+-------------------+ FV Prox  Full                                                             +---------+---------------+---------+-----------+----------+-------------------+ FV Mid   Full                                                             +---------+---------------+---------+-----------+----------+-------------------+ FV DistalFull                    Yes                  Not well visualized +---------+---------------+---------+-----------+----------+-------------------+ PFV      Full                                                             +---------+---------------+---------+-----------+----------+-------------------+  POP      Full           Yes      Yes                                      +---------+---------------+---------+-----------+----------+-------------------+ PTV      Full                    Yes                                       +---------+---------------+---------+-----------+----------+-------------------+ PERO     Full                    Yes                  Not well visualized +---------+---------------+---------+-----------+----------+-------------------+   +----+---------------+---------+-----------+----------+--------------+ LEFTCompressibilityPhasicitySpontaneityPropertiesThrombus Aging +----+---------------+---------+-----------+----------+--------------+ CFV Full           Yes      Yes                                 +----+---------------+---------+-----------+----------+--------------+    Summary: RIGHT: - There is no evidence of deep vein thrombosis in the lower extremity.  - No cystic structure found in the popliteal fossa.  LEFT: - No evidence of common femoral vein obstruction.   *See table(s) above for measurements and observations. Electronically signed by Lemar Livings MD on 10/04/2023 at 7:47:55 PM.    Final    DG Chest 2 View Result Date: 10/04/2023 CLINICAL DATA:  Chest pain EXAM: CHEST - 2 VIEW COMPARISON:  03/17/2022 FINDINGS: Limited exam because of positioning and body habitus. Prominent heart size but normal vascularity. Negative for edema. Scattered bibasilar streaky opacities may represent atelectasis versus pneumonia. Blunting of the right costophrenic angle may be related to overlying soft tissues versus a small right effusion. No pneumothorax. Remote rotator cuff repair on the left. Degenerative changes throughout the spine. IMPRESSION: 1. Limited exam. 2. Bibasilar atelectasis versus pneumonia. 3. Possible small right effusion. Electronically Signed   By: Judie Petit.  Shick M.D.   On: 10/04/2023 16:55    Procedures Procedures    Medications Ordered in ED Medications  iohexol (OMNIPAQUE) 350 MG/ML injection 75 mL (75 mLs Intravenous Contrast Given 10/04/23 1749)  cephALEXin (KEFLEX) capsule 500 mg (500 mg Oral Given 10/04/23 2046)    ED Course/ Medical Decision Making/ A&P                                  Medical Decision Making Amount and/or Complexity of Data Reviewed Labs: ordered. Radiology: ordered.  Risk Prescription drug management.   This patient presents to the ED for concern of shortness of breath, this involves an extensive number of treatment options, and is a complaint that carries with it a high risk of complications and morbidity.  The differential diagnosis includes Anxiety, Anaphylaxis/Angioedema, Aspirated FB, Arrhythmia, CHF, Asthma, COPD, PNA, COVID/Flu/RSV, STEMI, Tamponade, TPNX, Sepsis   Co morbidities that complicate the patient evaluation  OA, CAD, depression, GERD, HTN, hypothyroidism, OSA    Additional history obtained:  Additional history obtained from labs from earlier today D-dimer: 1.70 Troponin: 25 BNP: 32  Problem List / ED Course / Critical interventions / Medication management  Patient presents to ED concerned for elevated troponin and d-dimer. Patient with intermittent cough and progressive DOE for years - no severe acute worsening in symptoms recently. It does not seem that patient has any new complaints today. Patient stating that he also has GERD which has been doing better ever since he has been on Prilosec. Physical exam with right lower extremity +2 pitting edema and mild erythema that is not present on left leg. Rest of physical exam reassuring. Patient afebrile with stable vitals.  I Ordered, and personally interpreted labs.  BMP unremarkable.  CBC without leukocytosis or anemia.  Troponin 25 -> 26. The patient was maintained on a cardiac monitor.  I personally viewed and interpreted the cardiac monitored which showed an underlying rhythm of: no acute abnormality from prior EKG I ordered imaging studies including chest xray, CTA chest, DVT US to assess for process contributing to patient's symptoms. I independently visualized and interpreted imaging. CTA chest without acute findings - there are chronic and stable lung  nodules concerning for asbestos. Patient stating that he was in the [place] and is aware that he had asbestos. US DVT study without acute process.  Patient does not appear to be having an emergent cardiopulmonary process requiring admission today. Shared all results with patient. Answered all questions. Patient stating that he wants to go home now which I think is appropriate at this time. Patient does have outpatient cardiologist that he will be following up with. I will provide patient with ABX for his RLE cellulitis. Patient tolerated first dose of keflex well here in ED.  I have reviewed the patients home medicines and have made adjustments as needed Staffed with Dr. Andria Meuse who agrees with plan. Patient was given return precautions. Patient stable for discharge at this time. Patient verbalized understanding of plan.   Social Determinants of Health:  geriatric          Final Clinical Impression(s) / ED Diagnoses Final diagnoses:  DOE (dyspnea on exertion)  Positive D dimer  Elevated troponin  Cellulitis of right lower extremity    Rx / DC Orders ED Discharge Orders          Ordered    cephALEXin (KEFLEX) 500 MG capsule  4 times daily        10/04/23 2056              Dorthy Cooler, [place] 10/04/23 2058    Anders Simmonds T, DO 10/04/23 2351

## 2023-10-04 NOTE — ED Notes (Signed)
 Patient care taken, resting, awaiting results.

## 2023-10-04 NOTE — Telephone Encounter (Signed)
 CRITICAL VALUE STICKER  CRITICAL VALUE: Trop. 25.2  RECEIVER (on-site recipient of call): Kim  DATE & TIME NOTIFIED: 4/8 1242  MESSENGER (representative from lab):  MD NOTIFIED: Yetta Barre  TIME OF NOTIFICATION: 1242

## 2023-10-04 NOTE — Progress Notes (Unsigned)
 Subjective:  Patient ID: [name] [name], male    DOB: September 10, 1945  Age: 78 y.o. MRN: [id number]  CC: Hypertension and Hypothyroidism   HPI  He complains of persistent DOE and leg edema. He denies CP or cough.  [name] [name] presents for f/up -     Outpatient Medications Prior to Visit  Medication Sig Dispense Refill   aspirin EC 81 MG tablet Take 1 tablet (81 mg total) by mouth 2 (two) times daily. 30 tablet    DULoxetine (CYMBALTA) 30 MG capsule TAKE 1 CAPSULE BY MOUTH DAILY 90 capsule 3   furosemide (LASIX) 40 MG tablet TAKE 1 TABLET BY MOUTH DAILY 90 tablet 3   levothyroxine (SYNTHROID) 125 MCG tablet TAKE 1 TABLET BY MOUTH DAILY 90 tablet 3   metoprolol succinate (TOPROL-XL) 25 MG 24 hr tablet TAKE 1 TABLET BY MOUTH ONCE  DAILY 90 tablet 0   Omega-3 Fatty Acids (SUPER OMEGA-3) 1000 MG CAPS Take by mouth.     omeprazole (PRILOSEC) 40 MG capsule TAKE 1 CAPSULE BY MOUTH DAILY 90 capsule 3   polyethylene glycol (MIRALAX / GLYCOLAX) 17 g packet Take 17 g by mouth daily.     potassium chloride SA (KLOR-CON M) 20 MEQ tablet TAKE 1 TABLET BY MOUTH DAILY 90 tablet 0   simvastatin (ZOCOR) 20 MG tablet TAKE 1 TABLET BY MOUTH DAILY AT  6 PM. 90 tablet 3   gabapentin (NEURONTIN) 300 MG capsule Take 1 capsule (300 mg total) by mouth 3 (three) times daily. 270 capsule 3   carbamazepine (TEGRETOL) 200 MG tablet Take 200 mg by mouth 2 (two) times daily.     No facility-administered medications prior to visit.    ROS Review of Systems  Constitutional:  Positive for fatigue and unexpected weight change (wt gain). Negative for appetite change, chills, diaphoresis and fever.  HENT:  Negative for congestion.   Respiratory:  Positive for shortness of breath. Negative for cough, chest tightness and wheezing.   Cardiovascular:  Positive for leg swelling. Negative for chest pain and palpitations.  Gastrointestinal:  Negative for abdominal pain, constipation, diarrhea, nausea and vomiting.   Endocrine: Negative.   Genitourinary: Negative.  Negative for difficulty urinating and dysuria.  Musculoskeletal:  Positive for arthralgias, back pain and gait problem. Negative for myalgias.  Skin: Negative.  Negative for color change.  Neurological:  Negative for dizziness and weakness.  Hematological:  Negative for adenopathy. Does not bruise/bleed easily.  Psychiatric/Behavioral:  Positive for confusion and decreased concentration. Negative for sleep disturbance. The patient is not nervous/anxious.     Objective:  BP 132/66 (BP Location: Left Arm, Patient Position: Sitting, Cuff Size: Large)   Pulse 74   Temp 98.2 F (36.8 C) (Oral)   Resp 16   Ht 5\' 10"  (1.778 m)   Wt 264 lb (119.7 kg)   SpO2 98%   BMI 37.88 kg/m   BP Readings from Last 3 Encounters:  10/04/23 (!) 133/99  10/04/23 132/66  04/29/23 112/82    Wt Readings from Last 3 Encounters:  10/04/23 262 lb 5.6 oz (119 kg)  10/04/23 264 lb (119.7 kg)  04/29/23 260 lb 6.4 oz (118.1 kg)    Physical Exam Vitals reviewed.  Constitutional:      General: He is not in acute distress.    Appearance: He is ill-appearing. He is not toxic-appearing or diaphoretic.  HENT:     Mouth/Throat:     Mouth: Mucous membranes are moist.  Eyes:  General: No scleral icterus.    Conjunctiva/sclera: Conjunctivae normal.  Cardiovascular:     Rate and Rhythm: Normal rate and regular rhythm. Occasional Extrasystoles are present.    Heart sounds: Murmur heard.     Systolic murmur is present with a grade of 1/6.     No diastolic murmur is present.     No friction rub. No gallop.     Comments: EKG-- SR with occasional PVC's, 72 bpm PVC's are new LAFB, RBBB - unchanged Minimal LVH Unchanged  Pulmonary:     Effort: Pulmonary effort is normal.     Breath sounds: No stridor. No wheezing, rhonchi or rales.  Abdominal:     General: Abdomen is flat.     Palpations: There is no mass.     Tenderness: There is no abdominal  tenderness. There is no guarding.     Hernia: No hernia is present.  Musculoskeletal:        General: Normal range of motion.     Cervical back: Neck supple.     Right lower leg: 2+ Edema present.     Left lower leg: 2+ Edema present.  Lymphadenopathy:     Cervical: No cervical adenopathy.  Skin:    General: Skin is warm and dry.  Neurological:     Mental Status: He is alert. Mental status is at baseline.  Psychiatric:        Mood and Affect: Mood normal.        Behavior: Behavior normal.     Lab Results  Component Value Date   WBC 8.4 10/04/2023   HGB 15.1 10/04/2023   HCT 46.1 10/04/2023   PLT 229 10/04/2023   GLUCOSE 99 10/04/2023   CHOL 120 03/03/2023   TRIG 146.0 03/03/2023   HDL 35.00 (L) 03/03/2023   LDLCALC 55 03/03/2023   ALT 15 10/04/2023   AST 19 10/04/2023   NA 135 10/04/2023   K 3.9 10/04/2023   CL 101 10/04/2023   CREATININE 0.96 10/04/2023   BUN 20 10/04/2023   CO2 24 10/04/2023   TSH 4.01 10/04/2023   PSA 0.25 09/22/2021   INR 1.03 07/29/2015   HGBA1C 6.1 10/04/2023    MR Lumbar Spine W Wo Contrast Result Date: 08/02/2022 CLINICAL DATA:  Degenerative lumbar spinal stenosis. EXAM: MRI LUMBAR SPINE WITHOUT AND WITH CONTRAST TECHNIQUE: Multiplanar and multiecho pulse sequences of the lumbar spine were obtained without and with intravenous contrast. CONTRAST:  10 cc of vueway intravenous COMPARISON:  01/10/2014 FINDINGS: Segmentation:  Standard. Alignment:  Physiologic. Vertebrae: No fracture, evidence of discitis, or bone lesion. Minor discogenic endplate edema at A5-4. Hemangioma in the right T12 body/pedicle. Conus medullaris and cauda equina: Conus extends to the L1 level. Conus and cauda equina appear normal. Paraspinal and other soft tissues: Atrophy of intrinsic back muscles. Disc levels: T12- L1: Ventral spondylitic spurring.  No neural impingement L1-L2: Ventral spondylitic spurring. Disc narrowing and circumferential bulging. Mild facet spurring.  Little change since prior. L2-L3: Solid arthrodesis with no impingement L3-L4: Solid arthrodesis with no impingement L4-L5: Solid arthrodesis with no impingement L5-S1:Solid arthrodesis with no impingement IMPRESSION: 1. Uncomplicated L2-S1 fusion with solid arthrodesis. 2. Spondylosis and degeneration at the open levels with little change since 2015. No neural compression. Electronically Signed   By: Tiburcio Pea M.D.   On: 08/02/2022 06:58   MR CERVICAL SPINE WO CONTRAST Result Date: 08/02/2022 CLINICAL DATA:  Cervical spondylosis with myelopathy. EXAM: MRI CERVICAL SPINE WITHOUT CONTRAST TECHNIQUE: Multiplanar, multisequence MR imaging  of the cervical spine was performed. No intravenous contrast was administered. COMPARISON:  02/02/2019 FINDINGS: Alignment: Straightening of cervical lordosis. Vertebrae: No fracture, evidence of discitis, or bone lesion. Cord: Discrete T2 hyperintensity in the right cord at the level of C3-4, also seen on prior. Posterior Fossa, vertebral arteries, paraspinal tissues: Negative for perispinal mass or inflammation. Disc levels: C2-3: Ankylosis.  No neural impingement. C3-4: ACDF with solid arthrodesis. Spurring crowds the right foramen but patent bilateral canal and foramina C4-5: Bulky facet spurring with ankylosis. Disc narrowing and fusion. Bilateral foraminal narrowing is mild to moderate. Mild spinal stenosis crowding the cord C5-6: Disc narrowing with endplate and uncovertebral ridging greater on the right. Mild facet spurring. Advanced right and moderate left foraminal impingement C6-7: Disc narrowing with endplate and uncovertebral ridging. Advanced biforaminal impingement, especially on the left. Ridging effaces the ventral subarachnoid space. C7-T1:Degenerative facet spurring on the left more than right. Disc narrowing and bulging with uncovertebral ridging. Mild right foraminal stenosis. IMPRESSION: 1. Solid fusion from C2-C5.  Mild spinal stenosis at C4-5. 2.  Bilateral degenerative foraminal impingement at C5-6 and  C6-7. 3. 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 Electronically Signed   By: Tiburcio Pea M.D.   On: 08/02/2022 06:55   CT Angio Chest PE W/Cm &/Or Wo Cm Result Date: 10/04/2023 CLINICAL DATA:  Abnormal EKG chest pain EXAM: CT ANGIOGRAPHY CHEST WITH CONTRAST TECHNIQUE: Multidetector CT imaging of the chest was performed using the standard protocol during bolus administration of intravenous contrast. Multiplanar CT image reconstructions and MIPs were obtained to evaluate the vascular anatomy. RADIATION DOSE REDUCTION: This exam was performed according to the departmental dose-optimization program which includes automated exposure control, adjustment of the mA and/or kV according to patient size and/or use of iterative reconstruction technique. CONTRAST:  75mL OMNIPAQUE IOHEXOL 350 MG/ML SOLN COMPARISON:  Chest x-[place] 10/04/2023, CT chest 10/04/2016 trauma CT chest 11/27/2018 FINDINGS: Cardiovascular: Satisfactory opacification of the pulmonary arteries to the segmental level. No evidence of pulmonary embolism. Mild aneurysmal dilatation of the ascending aorta up to 4.1 cm previously 4.1 cm. Coronary vascular calcification. Normal cardiac size. No pericardial effusion Mediastinum/Nodes: No enlarged mediastinal, hilar, or axillary lymph nodes. Thyroid gland, trachea, and esophagus demonstrate no significant findings. Lungs/Pleura: No acute airspace disease, pleural effusion or pneumothorax. Numerous scattered bilateral pulmonary nodules, measuring up to 6 mm in the left lower lobe on series 6, image 103, stable compared to 2020, no imaging follow-up is recommended. No new or enlarging pulmonary nodules. 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 IMPRESSION: 1. Negative for acute pulmonary embolus. 2. No acute  airspace disease. 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 Stable mild aneurysmal dilatation of the ascending aorta up to 4.1 cm. Aortic aneurysm NOS (ICD10-I71.9).Aortic Atherosclerosis (ICD10-I70.0). Electronically Signed   By: Jasmine Pang M.D.   On: 10/04/2023 20:18   VAS [name] LOWER EXTREMITY VENOUS (DVT) (7a-7p) Result Date: 10/04/2023  Lower Venous DVT Study Patient Name:  [name] [name]  Date of Exam:   10/04/2023 Medical Rec #: [id number]      Accession #:    [id number] Date of Birth: Jul 28, 1945      Patient Gender: M Patient Age:   14 years Exam Location:  [facility] Procedure:      VAS [name] LOWER EXTREMITY VENOUS (DVT) Referring Phys: [facility] --------------------------------------------------------------------------------  Indications: Pain.  Risk Factors: Obesity.  Limitations: Body habitus. Comparison Study: No significant changes seen since previous exam 09/18/15. Performing Technologist: Shona Simpson  Examination Guidelines: A complete evaluation includes B-mode imaging, spectral Doppler, color Doppler, and power Doppler as needed of all accessible portions of each vessel. Bilateral testing is considered an integral part of a complete examination. Limited examinations for reoccurring indications may be performed as noted. The reflux portion of the exam is performed with the patient in reverse Trendelenburg.  +---------+---------------+---------+-----------+----------+-------------------+ RIGHT    CompressibilityPhasicitySpontaneityPropertiesThrombus Aging      +---------+---------------+---------+-----------+----------+-------------------+ CFV      Full           Yes      Yes                                      +---------+---------------+---------+-----------+----------+-------------------+ SFJ      Full                                                             +---------+---------------+---------+-----------+----------+-------------------+ FV Prox   Full                                                             +---------+---------------+---------+-----------+----------+-------------------+ FV Mid   Full                                                             +---------+---------------+---------+-----------+----------+-------------------+ FV DistalFull                    Yes                  Not well visualized +---------+---------------+---------+-----------+----------+-------------------+ PFV      Full                                                             +---------+---------------+---------+-----------+----------+-------------------+ POP      Full           Yes      Yes                                      +---------+---------------+---------+-----------+----------+-------------------+ PTV      Full                    Yes                                      +---------+---------------+---------+-----------+----------+-------------------+ PERO     Full  Yes                  Not well visualized +---------+---------------+---------+-----------+----------+-------------------+   +----+---------------+---------+-----------+----------+--------------+ LEFTCompressibilityPhasicitySpontaneityPropertiesThrombus Aging +----+---------------+---------+-----------+----------+--------------+ CFV Full           Yes      Yes                                 +----+---------------+---------+-----------+----------+--------------+    Summary: RIGHT: - There is no evidence of deep vein thrombosis in the lower extremity.  - No cystic structure found in the popliteal fossa.  LEFT: - No evidence of common femoral vein obstruction.   *See table(s) above for measurements and observations. Electronically signed by Lemar Livings MD on 10/04/2023 at 7:47:55 PM.    Final    DG Chest 2 View Result Date: 10/04/2023 CLINICAL DATA:  Chest pain EXAM: CHEST - 2 VIEW COMPARISON:  03/17/2022 FINDINGS: Limited  exam because of positioning and body habitus. Prominent heart size but normal vascularity. Negative for edema. Scattered bibasilar streaky opacities may represent atelectasis versus pneumonia. Blunting of the right costophrenic angle may be related to overlying soft tissues versus a small right effusion. No pneumothorax. Remote rotator cuff repair on the left. Degenerative changes throughout the spine. IMPRESSION: 1. Limited exam. 2. Bibasilar atelectasis versus pneumonia. 3. Possible small right effusion. Electronically Signed   By: Judie Petit.  Shick M.D.   On: 10/04/2023 16:55     Assessment & Plan:   B12 deficiency -     CBC with Differential/Platelet; Future -     Folate; Future -     Vitamin B12; Future  Prediabetes- His A1C is 6.1%. -     Basic metabolic panel with GFR; Future -     Hemoglobin A1c; Future  Acquired hypothyroidism- He is euthyroid. -     TSH; Future  Essential hypertension- BP is well controled. -     Basic metabolic panel with GFR; Future -     TSH; Future -     EKG 12-Lead -     Urinalysis, Routine w reflex microscopic; Future  SOB (shortness of breath)- Troponin was mildly elevated. He was seen in the ED. -     EKG 12-Lead -     Brain natriuretic peptide; Future -     D-dimer, quantitative; Future -     Troponin I (High Sensitivity); Future  Bilateral leg edema -     Urinalysis, Routine w reflex microscopic; Future -     Hepatic function panel; Future -     Brain natriuretic peptide; Future -     D-dimer, quantitative; Future -     Troponin I (High Sensitivity); Future  Trigeminal neuralgia of right side of face -     Gabapentin; Take 1 capsule (300 mg total) by mouth 3 (three) times daily.  Dispense: 270 capsule; Refill: 1     Follow-up: Return in about 6 months (around 04/04/2024).  Sanda Linger, MD

## 2023-10-04 NOTE — Telephone Encounter (Signed)
Ask him to go to [facility] ED

## 2023-10-04 NOTE — Discharge Instructions (Addendum)
 It was a pleasure caring for you today. Please follow up with your primary care and cardiologist.  Seek emergency care experiencing any new or worsening symptoms.

## 2023-10-05 LAB — VITAMIN B12: Vitamin B-12: 399 pg/mL (ref 211–911)

## 2023-10-05 LAB — FOLATE: Folate: >25.2 ng/mL (ref >5.9)

## 2023-10-05 LAB — TSH: TSH: 4.01 u[IU]/mL (ref 0.35–5.50)

## 2023-10-05 NOTE — Telephone Encounter (Signed)
 This has been handled in a telephone call.

## 2023-10-13 ENCOUNTER — Ambulatory Visit (INDEPENDENT_AMBULATORY_CARE_PROVIDER_SITE_OTHER): Payer: Medicare Other

## 2023-10-13 ENCOUNTER — Telehealth: Payer: Self-pay

## 2023-10-13 VITALS — Ht 70.0 in | Wt 262.0 lb

## 2023-10-13 DIAGNOSIS — Z0000 Encounter for general adult medical examination without abnormal findings: Secondary | ICD-10-CM | POA: Diagnosis not present

## 2023-10-13 NOTE — Telephone Encounter (Signed)
 Patient is requesting a refill for the antibotic given to him during his ER visit last week.  Patient's wife stated that the ER doctor informed him that he would need 2 rounds of medication.  It was cephALEXin (KEFLEX) 500 MG capsule  4 times daily .

## 2023-10-13 NOTE — Telephone Encounter (Signed)
 Patient scheduled for Monday, 10/17/23.  Dr. Sagardia.

## 2023-10-13 NOTE — Progress Notes (Signed)
 Subjective:   [name] [name] is a 78 y.o. who presents for a Medicare Wellness preventive visit.  Visit Complete: Virtual I connected with  [name] [name] on 10/13/23 by a audio enabled telemedicine application and verified that I am speaking with the correct person using two identifiers.  Patient Location: Home  Provider Location: Home Office  I discussed the limitations of evaluation and management by telemedicine. The patient expressed understanding and agreed to proceed.  Vital Signs: Because this visit was a virtual/telehealth visit, some criteria may be missing or patient reported. Any vitals not documented were not able to be obtained and vitals that have been documented are patient reported.  VideoDeclined- This patient declined Librarian, academic. Therefore the visit was completed with audio only.  Persons Participating in Visit: Patient assisted by wife.  AWV Questionnaire: No: Patient Medicare AWV questionnaire was not completed prior to this visit.  Cardiac Risk Factors include: advanced age (>48men, >42 women);hypertension;male gender;Other (see comment);dyslipidemia, Risk factor comments: CAD, OSA, Dilatation of thoracic aorta, Atherosclerosis of aorta     Objective:    Today's Vitals   10/13/23 0846  Weight: 262 lb (118.8 kg)  Height: 5\' 10"  (1.778 m)   Body mass index is 37.59 kg/m.     10/13/2023    9:06 AM 10/04/2023    2:47 PM 10/12/2022    9:39 AM 06/02/2022   11:01 AM 10/07/2021    8:50 AM 10/06/2020    9:43 AM 08/29/2020   10:59 AM  Advanced Directives  Does Patient Have a Medical Advance Directive? No No No No No No No  Does patient want to make changes to medical advance directive?      No - Patient declined   Would patient like information on creating a medical advance directive?  No - Patient declined No - Patient declined  No - Patient declined  No - Patient declined    Current Medications (verified) Outpatient Encounter  Medications as of 10/13/2023  Medication Sig   aspirin EC 81 MG tablet Take 1 tablet (81 mg total) by mouth 2 (two) times daily.   DULoxetine (CYMBALTA) 30 MG capsule TAKE 1 CAPSULE BY MOUTH DAILY   furosemide (LASIX) 40 MG tablet TAKE 1 TABLET BY MOUTH DAILY   gabapentin (NEURONTIN) 300 MG capsule Take 1 capsule (300 mg total) by mouth 3 (three) times daily.   levothyroxine (SYNTHROID) 125 MCG tablet TAKE 1 TABLET BY MOUTH DAILY   metoprolol succinate (TOPROL-XL) 25 MG 24 hr tablet TAKE 1 TABLET BY MOUTH ONCE  DAILY   Omega-3 Fatty Acids (SUPER OMEGA-3) 1000 MG CAPS Take by mouth.   omeprazole (PRILOSEC) 40 MG capsule TAKE 1 CAPSULE BY MOUTH DAILY   polyethylene glycol (MIRALAX / GLYCOLAX) 17 g packet Take 17 g by mouth daily.   potassium chloride SA (KLOR-CON M) 20 MEQ tablet TAKE 1 TABLET BY MOUTH DAILY   simvastatin (ZOCOR) 20 MG tablet TAKE 1 TABLET BY MOUTH DAILY AT  6 PM.   No facility-administered encounter medications on file as of 10/13/2023.    Allergies (verified) Penicillins, Codeine, and Sulfonamide derivatives   History: Past Medical History:  Diagnosis Date   Arthritis    CAD (coronary artery disease)    cath 3/10: mLAD 30%, pRCA 30%, EF 55-60% Dr. Tenny Craw cardiologist  most recent  stress stress done ~ 2 years ago with Dr. Tenny Craw   Complication of anesthesia    "he usually gets an ileus after back OR"  Depression    Dyspnea    -PFTs compeltely normal 03/20/08 including DLc0   Environmental allergies    Dust, Smoke   GERD (gastroesophageal reflux disease)    HTN (hypertension)    Hypothyroidism    Low testosterone    Observed sleep apnea    can't wear cpap   Polycythemia    PONV (postoperative nausea and vomiting)    Pulmonary nodule 06/13/2012   Rosacea    Sleep apnea    Most recent sleep study 2010; records at [facility]   Past Surgical History:  Procedure Laterality Date   BACK SURGERY  2005; 06/2006; 01/2007; 04/2009   CARDIAC CATHETERIZATION   08/2008   CATARACT EXTRACTION, BILATERAL  2010   CERVICAL FUSION  04/2002   ?C3-4   COLONOSCOPY  12/2005   normal screening study.    ESOPHAGOGASTRODUODENOSCOPY  multiple   last 07/2012 GERD esophagitis, 48 Fr dilation   ESOPHAGOGASTRODUODENOSCOPY N/A 09/19/2015   Procedure: ESOPHAGOGASTRODUODENOSCOPY (EGD);  Surgeon: Meryl Dare, MD;  Location: [facility] ENDOSCOPY;  Service: Endoscopy;  Laterality: N/A;   KNEE ARTHROSCOPY WITH MEDIAL MENISECTOMY Right 05/14/2015   Procedure: RIGHT KNEE ARTHROSCOPY CHONDROPLASTY, PARTIAL  MEDIAL MENISECTOMY;  Surgeon: Frederico Hamman, MD;  Location: [facility];  Service: Orthopedics;  Laterality: Right;   left knee replacement     left shoudler clean     LUMBAR DISC SURGERY  06/2006   L3   MENISCUS REPAIR  08/2008   left   NECK SURGERY     POSTERIOR FUSION LUMBAR SPINE  2005; 01/2007; 04/2009   L5; L3-4; L2-3   PROSTATE SURGERY  2000   SAVORY DILATION N/A 09/19/2015   Procedure: SAVORY DILATION;  Surgeon: Meryl Dare, MD;  Location: [facility] ENDOSCOPY;  Service: Endoscopy;  Laterality: N/A;   SHOULDER OPEN ROTATOR CUFF REPAIR Left 1999   left   TONSILLECTOMY  09/21/11   TOTAL KNEE ARTHROPLASTY Left 01/2011   left   TOTAL KNEE ARTHROPLASTY Right 08/08/2015   Procedure: RIGHT TOTAL KNEE ARTHROPLASTY;  Surgeon: Frederico Hamman, MD;  Location: [facility] OR;  Service: Orthopedics;  Laterality: Right;   TRANSURETHRAL RESECTION OF PROSTATE  2006   followed by "surgery to get rid of clots"   UVULOPALATOPHARYNGOPLASTY, TONSILLECTOMY AND SEPTOPLASTY  09/21/11   Deviated Septum   Family History  Problem Relation Age of Onset   Breast cancer Mother        dx. mid-70s   Colon polyps Mother        "76+ colon polyps per yr"   Diabetes Mother    Breast cancer Sister 52   Other Sister        + MUTYH mutation   Diabetes Sister        borderline   Heart Problems Sister    Breast cancer Sister        dx. 40s   Heart attack Father    Alzheimer's disease Father     Lung cancer Paternal Uncle        x4   Throat cancer Maternal Uncle        mother's maternal half-brother; dx 69s; +EtOH   Liver cancer Maternal Uncle        mother's maternal half-brother; either liver cancer or cirrhosis   Cirrhosis Maternal Uncle        +EtOH   Stroke Paternal Grandmother    Heart attack Paternal Grandfather    Other Daughter        negative genetic testing  in 2017   Parkinson's disease Other    Bipolar disorder Other    Lung cancer Paternal Uncle        (x3)   Lung cancer Paternal Uncle    Alzheimer's disease Cousin        paternal 1st cousin, d. 15   Heart attack Cousin    Anesthesia problems Neg Hx    Social History   Socioeconomic History   Marital status: Married    Spouse name: Cornelius Dill   Number of children: 3   Years of education: Not on file   Highest education level: 11th grade  Occupational History   Occupation: retired    Associate Professor: [employer]  Tobacco Use   Smoking status: Former    Current packs/day: 0.00    Average packs/day: 1 pack/day for 16.0 years (16.0 ttl pk-yrs)    Types: Cigarettes    Start date: 07/17/1958    Quit date: 07/17/1974    Years since quitting: 49.2   Smokeless tobacco: Former  Building services engineer status: Never Used  Substance and Sexual Activity   Alcohol use: No   Drug use: No   Sexual activity: Not on file  Other Topics Concern   Not on file  Social History Narrative   Area, 3 children.Retired from the [employer] of [employer] caffeinated beverages daily   Home is one story with wife   Social Drivers of Corporate investment banker Strain: Low Risk  (10/13/2023)   Overall Financial Resource Strain (CARDIA)    Difficulty of Paying Living Expenses: Not very hard  Recent Concern: Financial Resource Strain - Medium Risk (10/03/2023)   Overall Financial Resource Strain (CARDIA)    Difficulty of Paying Living Expenses: Somewhat hard  Food Insecurity: No Food Insecurity (10/13/2023)   Hunger Vital Sign     Worried About Running Out of Food in the Last Year: Never true    Ran Out of Food in the Last Year: Never true  Transportation Needs: No Transportation Needs (10/13/2023)   PRAPARE - Administrator, Civil Service (Medical): No    Lack of Transportation (Non-Medical): No  Physical Activity: Inactive (10/13/2023)   Exercise Vital Sign    Days of Exercise per Week: 0 days    Minutes of Exercise per Session: 0 min  Stress: No Stress Concern Present (10/13/2023)   [employer] of Occupational Health - Occupational Stress Questionnaire    Feeling of Stress : Not at all  Recent Concern: Stress - Stress Concern Present (10/03/2023)   [employer] of Occupational Health - Occupational Stress Questionnaire    Feeling of Stress : To some extent  Social Connections: Socially Isolated (10/13/2023)   Social Connection and Isolation Panel [NHANES]    Frequency of Communication with Friends and Family: Never    Frequency of Social Gatherings with Friends and Family: Never    Attends Religious Services: Never    Database administrator or Organizations: No    Attends Engineer, structural: Never    Marital Status: Married    Tobacco Counseling Counseling given: Not Answered    Clinical Intake:  Pre-visit preparation completed: Yes  Pain : No/denies pain     BMI - recorded: 37.59 Nutritional Status: BMI > 30  Obese Nutritional Risks: Other (Comment) (constipation) Diabetes: No  Lab Results  Component Value Date   HGBA1C 6.1 10/04/2023   HGBA1C 6.0 03/03/2023   HGBA1C 6.1 03/17/2022     How often  do you need to have someone help you when you read instructions, pamphlets, or other written materials from your doctor or pharmacy?: 1 - Never  Interpreter Needed?: No  Comments: Wife helped with visit due to patient being hard of hearing/ no hearing aides Information entered by :: Jeydi Klingel ,RMA   Activities of Daily Living     10/13/2023    8:47 AM  In  your present state of health, do you have any difficulty performing the following activities:  Hearing? 1  Vision? 0  Difficulty concentrating or making decisions? 1  Walking or climbing stairs? 0  Dressing or bathing? 0  Doing errands, shopping? 0  Preparing Food and eating ? N  Using the Toilet? N  In the past six months, have you accidently leaked urine? Y  Do you have problems with loss of bowel control? N  Managing your Medications? N  Managing your Finances? N  Housekeeping or managing your Housekeeping? N    Patient Care Team: Etta Grandchild, MD as PCP - General (Internal Medicine) Pricilla Riffle, MD as PCP - Cardiology (Cardiology) Benjiman Core, MD (Inactive) (Hematology and Oncology) Bjorn Pippin, MD (Urology) Barnett Abu, MD (Neurosurgery) Janet Berlin, MD as Consulting Physician (Ophthalmology) Delories Heinz, DPM (Podiatry) Iva Boop, MD as Consulting Physician (Gastroenterology) Szabat, Vinnie Level, [facility] (Inactive) (Pharmacist) Josephine Igo, DO as Consulting Physician (Pulmonary Disease)  Indicate any recent Medical Services you may have received from other than [facility] providers in the past year (date may be approximate).     Assessment:   This is a routine wellness examination for Carroll.  Hearing/Vision screen Hearing Screening - Comments:: Has some hearing problems Vision Screening - Comments:: Wears reading glasses   Goals Addressed               This Visit's Progress     Patient Stated (pt-stated)        I just want to feel better. Same goal/2025       Depression Screen     10/13/2023    9:11 AM 10/04/2023   10:36 AM 10/12/2022    9:38 AM 12/01/2021    2:29 PM 10/07/2021    8:59 AM 10/06/2020    9:42 AM 09/22/2020   12:04 PM  PHQ 2/9 Scores  PHQ - 2 Score 1 3 0 1 2 2  0  PHQ- 9 Score 2 17  12   5     Fall Risk     10/13/2023    9:06 AM 10/04/2023   10:35 AM 10/12/2022    9:37 AM 06/02/2022   11:01 AM 12/01/2021    2:29 PM  Fall  Risk   Falls in the past year? 1 1 1 1 1   Number falls in past yr: 1 1 1 1 1   Injury with Fall? 0 0 0 1 0  Risk for fall due to :  Impaired mobility History of fall(s);Impaired balance/gait;Impaired mobility    Follow up Falls evaluation completed;Falls prevention discussed Falls evaluation completed Falls prevention discussed;Education provided;Falls evaluation completed Falls evaluation completed     MEDICARE RISK AT HOME:  Medicare Risk at Home Any stairs in or around the home?: Yes If so, are there any without handrails?: Yes Home free of loose throw rugs in walkways, pet beds, electrical cords, etc?: Yes Adequate lighting in your home to reduce risk of falls?: Yes Life alert?: No Use of a cane, walker or w/c?: Yes Grab bars in the bathroom?:  Yes Shower chair or bench in shower?: Yes Elevated toilet seat or a handicapped toilet?: Yes  TIMED UP AND GO:  Was the test performed?  No  Cognitive Function: 6CIT completed        10/13/2023    9:08 AM 10/12/2022    9:40 AM 10/07/2021    9:01 AM 10/03/2019   11:19 AM  6CIT Screen  What Year? 0 points 0 points 0 points 0 points  What month? 0 points 0 points 0 points 0 points  What time? 0 points 0 points 0 points 0 points  Count back from 20 0 points 0 points 0 points 0 points  Months in reverse 0 points 0 points 0 points 0 points  Repeat phrase 0 points 2 points 0 points 0 points  Total Score 0 points 2 points 0 points 0 points    Immunizations Immunization History  Administered Date(s) Administered   Fluad Quad(high Dose 65+) 03/08/2019, 03/19/2021, 03/17/2022   Fluad Trivalent(High Dose 65+) 03/03/2023   Influenza Split 03/28/2013   Influenza, High Dose Seasonal PF 03/28/2018   Influenza,inj,Quad PF,6+ Mos 02/26/2020   Influenza-Unspecified 03/29/2011   PPD Test 08/14/2015   Pneumococcal Conjugate-13 07/03/2014   Pneumococcal Polysaccharide-23 02/19/2013, 03/08/2019   Tdap 07/15/2008, 09/18/2018   Zoster  Recombinant(Shingrix) 02/23/2018, 05/03/2018    Screening Tests Health Maintenance  Topic Date Due   Medicare Annual Wellness (AWV)  10/12/2023   INFLUENZA VACCINE  01/27/2024   DTaP/Tdap/Td (3 - Td or Tdap) 09/17/2028   Pneumonia Vaccine 9+ Years old  Completed   Hepatitis C Screening  Completed   Zoster Vaccines- Shingrix  Completed   HPV VACCINES  Aged Out   Meningococcal B Vaccine  Aged Out   Colonoscopy  Discontinued   COVID-19 Vaccine  Discontinued    Health Maintenance  Health Maintenance Due  Topic Date Due   Medicare Annual Wellness (AWV)  10/12/2023   Health Maintenance Items Addressed: See Nurse Notes  Additional Screening:  Vision Screening: Recommended annual ophthalmology exams for early detection of glaucoma and other disorders of the eye.  Dental Screening: Recommended annual dental exams for proper oral hygiene  Community Resource Referral / Chronic Care Management: CRR required this visit?  No   CCM required this visit?  No     Plan:     I have personally reviewed and noted the following in the patient's chart:   Medical and social history Use of alcohol, tobacco or illicit drugs  Current medications and supplements including opioid prescriptions. Patient is not currently taking opioid prescriptions. Functional ability and status Nutritional status Physical activity Advanced directives List of other physicians Hospitalizations, surgeries, and ER visits in previous 12 months Vitals Screenings to include cognitive, depression, and falls Referrals and appointments  In addition, I have reviewed and discussed with patient certain preventive protocols, quality metrics, and best practice recommendations. A written personalized care plan for preventive services as well as general preventive health recommendations were provided to patient.     Ersie Savino L Kiron Osmun, CMA   10/13/2023   After Visit Summary: (MyChart) Due to this being a telephonic  visit, the after visit summary with patients personalized plan was offered to patient via MyChart   Notes: Please refer to Routing Comments.

## 2023-10-13 NOTE — Telephone Encounter (Signed)
 If the infection  has not resolved then he needs to be seen

## 2023-10-13 NOTE — Patient Instructions (Addendum)
 Mr. Bann , Thank you for taking time to come for your Medicare Wellness Visit. I appreciate your ongoing commitment to your health goals. Please review the following plan we discussed and let me know if I can assist you in the future.   Referrals/Orders/Follow-Ups/Clinician Recommendations: It was nice talking with you and your wife today.  Feel better soon.  This is a list of the screening recommended for you and due dates:  Health Maintenance  Topic Date Due   Flu Shot  01/27/2024   Medicare Annual Wellness Visit  10/12/2024   DTaP/Tdap/Td vaccine (3 - Td or Tdap) 09/17/2028   Pneumonia Vaccine  Completed   Hepatitis C Screening  Completed   Zoster (Shingles) Vaccine  Completed   HPV Vaccine  Aged Out   Meningitis B Vaccine  Aged Out   Colon Cancer Screening  Discontinued   COVID-19 Vaccine  Discontinued    Advanced directives: (Declined) Advance directive discussed with you today. Even though you declined this today, please call our office should you change your mind, and we can give you the proper paperwork for you to fill out.  Next Medicare Annual Wellness Visit scheduled for next year: Yes

## 2023-10-17 ENCOUNTER — Encounter: Payer: Self-pay | Admitting: Emergency Medicine

## 2023-10-17 ENCOUNTER — Ambulatory Visit: Admitting: Emergency Medicine

## 2023-10-17 VITALS — BP 120/82 | HR 74 | Temp 98.2°F | Ht 70.0 in | Wt 258.4 lb

## 2023-10-17 DIAGNOSIS — I77810 Thoracic aortic ectasia: Secondary | ICD-10-CM | POA: Diagnosis not present

## 2023-10-17 DIAGNOSIS — L03115 Cellulitis of right lower limb: Secondary | ICD-10-CM | POA: Insufficient documentation

## 2023-10-17 DIAGNOSIS — E039 Hypothyroidism, unspecified: Secondary | ICD-10-CM

## 2023-10-17 DIAGNOSIS — I10 Essential (primary) hypertension: Secondary | ICD-10-CM | POA: Diagnosis not present

## 2023-10-17 DIAGNOSIS — R0602 Shortness of breath: Secondary | ICD-10-CM

## 2023-10-17 DIAGNOSIS — R600 Localized edema: Secondary | ICD-10-CM

## 2023-10-17 MED ORDER — CEFADROXIL 500 MG PO CAPS: 500.0000 mg | ORAL_CAPSULE | Freq: Two times a day (BID) | ORAL | 0 refills | Status: AC

## 2023-10-17 NOTE — Assessment & Plan Note (Signed)
 Partially treated infection.  Still active. Took cephalexin  4 times a day for 5 days.  Finished 1 week ago. Recommend to start Duricef 500 mg twice a day for 7 more days. Needs to follow-up with PCP in 1 to 2 weeks. ED precautions given Advised to contact the office or send us  a message if no better or worse during the next several days

## 2023-10-17 NOTE — Progress Notes (Signed)
 [name] [name] 78 y.o.   Chief Complaint  Patient presents with   Medical Management of Chronic Issues    ED follow up, med refill ( from the ED, cephalexin ) , still experiencing SOB, chest congestion     HISTORY OF PRESENT ILLNESS: This is a 78 y.o. male here for follow-up of emergency department visit on 10/04/2023 when he presented with some chronic difficulty breathing Unremarkable EKG.  Negative troponins.  Negative Doppler leg ultrasound.  CTA of chest with no acute problems Patient has history of thoracic aneurysm.  Stable.  No PE on CTA.  No DVT on ultrasound. Found to have erythema with swelling right lower leg more than left.  Was started on cephalexin  4 times a day for 5 days with suspected cellulitis. After observation he was sent home to follow-up with his PCP Dr. Sandra Crouch. Unable to see him today.  Was scheduled with me instead. No acute complaints. No other concern or medical complaints today.  HPI   Prior to Admission medications   Medication Sig Start Date End Date Taking? Authorizing Provider  aspirin  EC 81 MG tablet Take 1 tablet (81 mg total) by mouth 2 (two) times daily. 08/12/21  Yes Elmyra Haggard, MD  DULoxetine  (CYMBALTA ) 30 MG capsule TAKE 1 CAPSULE BY MOUTH DAILY 07/29/23  Yes Arcadio Knuckles, MD  furosemide  (LASIX ) 40 MG tablet TAKE 1 TABLET BY MOUTH DAILY 08/22/23  Yes Elmyra Haggard, MD  gabapentin  (NEURONTIN ) 300 MG capsule Take 1 capsule (300 mg total) by mouth 3 (three) times daily. 10/04/23  Yes Arcadio Knuckles, MD  levothyroxine  (SYNTHROID ) 125 MCG tablet TAKE 1 TABLET BY MOUTH DAILY 08/29/23  Yes Arcadio Knuckles, MD  metoprolol  succinate (TOPROL -XL) 25 MG 24 hr tablet TAKE 1 TABLET BY MOUTH ONCE  DAILY 08/18/23  Yes Rueben Cote, Tessa N, PA-C  Omega-3 Fatty Acids (SUPER OMEGA-3) 1000 MG CAPS Take by mouth.   Yes [provider]  omeprazole  (PRILOSEC) 40 MG capsule TAKE 1 CAPSULE BY MOUTH DAILY 08/29/23  Yes Arcadio Knuckles, MD  polyethylene glycol (MIRALAX   / GLYCOLAX ) 17 g packet Take 17 g by mouth daily.   Yes [provider]  potassium chloride  SA (KLOR-CON  M) 20 MEQ tablet TAKE 1 TABLET BY MOUTH DAILY 08/18/23  Yes Conte, Tessa N, PA-C  simvastatin  (ZOCOR ) 20 MG tablet TAKE 1 TABLET BY MOUTH DAILY AT  6 PM. 08/29/23  Yes Arcadio Knuckles, MD    Allergies  Allergen Reactions   Penicillins Hives and Other (See Comments)    "I break out in welts" Has patient had a PCN reaction causing immediate rash, facial/tongue/throat swelling, SOB or lightheadedness with hypotension: No Has patient had a PCN reaction causing severe rash involving mucus membranes or skin necrosis: No Has patient had a PCN reaction that required hospitalization No Has patient had a PCN reaction occurring within the last 10 years: No If all of the above answers are "NO", then may proceed with Cephalosporin use.   Codeine Nausea And Vomiting   Sulfonamide Derivatives Nausea And Vomiting    Patient Active Problem List   Diagnosis Date Noted   Degenerative lumbar spinal stenosis 10/04/2023   Bilateral leg edema 10/04/2023   B12 deficiency 06/02/2022   History of lacunar cerebrovascular accident 12/29/2021   Trigeminal neuralgia of right side of face 12/01/2021   Encounter for general adult medical examination with abnormal findings 09/22/2021   Flu vaccine need 03/19/2021   Atherosclerosis of aorta (HCC)  09/22/2020   Onychomycosis of toenail 09/05/2019   Benign prostatic hyperplasia without lower urinary tract symptoms 09/18/2018   Prediabetes 09/18/2018   Claudication of both lower extremities (HCC) 09/18/2018   Hyperlipidemia LDL goal <70 09/18/2018   Pure hyperglyceridemia 09/18/2018   GERD (gastroesophageal reflux disease)    SOB (shortness of breath)    CAD (coronary artery disease)    OSA (obstructive sleep apnea) 09/18/2015   Mild concentric left ventricular hypertrophy (LVH) 09/18/2015   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HCC) 09/18/2015   Hypothyroidism  09/18/2015   Chronic idiopathic constipation 08/18/2015   Primary localized osteoarthritis of right knee 08/08/2015   Pulmonary nodule 06/13/2012   CAD, NATIVE VESSEL 06/18/2009   Essential hypertension 07/14/2007    Past Medical History:  Diagnosis Date   Arthritis    CAD (coronary artery disease)    cath 3/10: mLAD 30%, pRCA 30%, EF 55-60% Dr. Avanell Bob cardiologist  most recent  stress stress done ~ 2 years ago with Dr. Avanell Bob   Complication of anesthesia    "he usually gets an ileus after back OR"    Depression    Dyspnea    -PFTs compeltely normal 03/20/08 including DLc0   Environmental allergies    Dust, Smoke   GERD (gastroesophageal reflux disease)    HTN (hypertension)    Hypothyroidism    Low testosterone     Observed sleep apnea    can't wear cpap   Polycythemia    PONV (postoperative nausea and vomiting)    Pulmonary nodule 06/13/2012   Rosacea    Sleep apnea    Most recent sleep study 2010; records at [facility]    Past Surgical History:  Procedure Laterality Date   BACK SURGERY  2005; 06/2006; 01/2007; 04/2009   CARDIAC CATHETERIZATION  08/2008   CATARACT EXTRACTION, BILATERAL  2010   CERVICAL FUSION  04/2002   ?C3-4   COLONOSCOPY  12/2005   normal screening study.    ESOPHAGOGASTRODUODENOSCOPY  multiple   last 07/2012 GERD esophagitis, 48 Fr dilation   ESOPHAGOGASTRODUODENOSCOPY N/A 09/19/2015   Procedure: ESOPHAGOGASTRODUODENOSCOPY (EGD);  Surgeon: Asencion Blacksmith, MD;  Location[facility] ENDOSCOPY;  Service: Endoscopy;  Laterality: N/A;   KNEE ARTHROSCOPY WITH MEDIAL MENISECTOMY Right 05/14/2015   Procedure: RIGHT KNEE ARTHROSCOPY CHONDROPLASTY, PARTIAL  MEDIAL MENISECTOMY;  Surgeon: Marlena Sima, MD;  Location: [facility];  Service: Orthopedics;  Laterality: Right;   left knee replacement     left shoudler clean     LUMBAR DISC SURGERY  06/2006   L3   MENISCUS REPAIR  08/2008   left   NECK SURGERY     POSTERIOR FUSION LUMBAR SPINE  2005;  01/2007; 04/2009   L5; L3-4; L2-3   PROSTATE SURGERY  2000   SAVORY DILATION N/A 09/19/2015   Procedure: SAVORY DILATION;  Surgeon: Asencion Blacksmith, MD;  Location: [facility] ENDOSCOPY;  Service: Endoscopy;  Laterality: N/A;   SHOULDER OPEN ROTATOR CUFF REPAIR Left 1999   left   TONSILLECTOMY  09/21/11   TOTAL KNEE ARTHROPLASTY Left 01/2011   left   TOTAL KNEE ARTHROPLASTY Right 08/08/2015   Procedure: RIGHT TOTAL KNEE ARTHROPLASTY;  Surgeon: Marlena Sima, MD;  Location: [facility] OR;  Service: Orthopedics;  Laterality: Right;   TRANSURETHRAL RESECTION OF PROSTATE  2006   followed by "surgery to get rid of clots"   UVULOPALATOPHARYNGOPLASTY, TONSILLECTOMY AND SEPTOPLASTY  09/21/11   Deviated Septum    Social History   Socioeconomic History   Marital status: Married  Spouse name: Cornelius Dill   Number of children: 3   Years of education: Not on file   Highest education level: 11th grade  Occupational History   Occupation: retired    Associate Professor: [employer]  Tobacco Use   Smoking status: Former    Current packs/day: 0.00    Average packs/day: 1 pack/day for 16.0 years (16.0 ttl pk-yrs)    Types: Cigarettes    Start date: 07/17/1958    Quit date: 07/17/1974    Years since quitting: 49.2   Smokeless tobacco: Former  Building services engineer status: Never Used  Substance and Sexual Activity   Alcohol use: No   Drug use: No   Sexual activity: Not on file  Other Topics Concern   Not on file  Social History Narrative   Area, 3 children.Retired from the [employer] of [employer] caffeinated beverages daily   Home is one story with wife   Social Drivers of Corporate investment banker Strain: Low Risk  (10/13/2023)   Overall Financial Resource Strain (CARDIA)    Difficulty of Paying Living Expenses: Not very hard  Recent Concern: Financial Resource Strain - Medium Risk (10/03/2023)   Overall Financial Resource Strain (CARDIA)    Difficulty of Paying Living Expenses: Somewhat hard  Food Insecurity: No  Food Insecurity (10/13/2023)   Hunger Vital Sign    Worried About Running Out of Food in the Last Year: Never true    Ran Out of Food in the Last Year: Never true  Transportation Needs: No Transportation Needs (10/13/2023)   PRAPARE - Administrator, Civil Service (Medical): No    Lack of Transportation (Non-Medical): No  Physical Activity: Inactive (10/13/2023)   Exercise Vital Sign    Days of Exercise per Week: 0 days    Minutes of Exercise per Session: 0 min  Stress: No Stress Concern Present (10/13/2023)   [employer] of Occupational Health - Occupational Stress Questionnaire    Feeling of Stress : Not at all  Recent Concern: Stress - Stress Concern Present (10/03/2023)   [employer] of Occupational Health - Occupational Stress Questionnaire    Feeling of Stress : To some extent  Social Connections: Socially Isolated (10/13/2023)   Social Connection and Isolation Panel [NHANES]    Frequency of Communication with Friends and Family: Never    Frequency of Social Gatherings with Friends and Family: Never    Attends Religious Services: Never    Database administrator or Organizations: No    Attends Banker Meetings: Never    Marital Status: Married  Catering manager Violence: Not At Risk (10/13/2023)   Humiliation, Afraid, Rape, and Kick questionnaire    Fear of Current or Ex-Partner: No    Emotionally Abused: No    Physically Abused: No    Sexually Abused: No    Family History  Problem Relation Age of Onset   Breast cancer Mother        dx. mid-70s   Colon polyps Mother        "20+ colon polyps per yr"   Diabetes Mother    Breast cancer Sister 14   Other Sister        + MUTYH mutation   Diabetes Sister        borderline   Heart Problems Sister    Breast cancer Sister        dx. 40s   Heart attack Father    Alzheimer's disease Father  Lung cancer Paternal Uncle        x4   Throat cancer Maternal Uncle        mother's maternal  half-brother; dx 61s; +EtOH   Liver cancer Maternal Uncle        mother's maternal half-brother; either liver cancer or cirrhosis   Cirrhosis Maternal Uncle        +EtOH   Stroke Paternal Grandmother    Heart attack Paternal Grandfather    Other Daughter        negative genetic testing in 2017   Parkinson's disease Other    Bipolar disorder Other    Lung cancer Paternal Uncle        (x3)   Lung cancer Paternal Uncle    Alzheimer's disease Cousin        paternal 1st cousin, d. 61   Heart attack Cousin    Anesthesia problems Neg Hx      Review of Systems  Constitutional: Negative.  Negative for chills and fever.  HENT: Negative.  Negative for congestion and sore throat.   Respiratory:  Positive for shortness of breath (Chronic problem). Negative for cough.   Cardiovascular: Negative.  Negative for chest pain and palpitations.  Gastrointestinal:  Negative for abdominal pain, nausea and vomiting.  Skin: Negative.  Negative for rash.  Neurological:  Negative for dizziness and headaches.  All other systems reviewed and are negative.   Vitals:   10/17/23 0838  BP: 120/82  Pulse: 74  Temp: 98.2 F (36.8 C)  SpO2: 98%    Physical Exam Vitals reviewed.  Constitutional:      Appearance: Normal appearance.  HENT:     Head: Normocephalic.     Mouth/Throat:     Mouth: Mucous membranes are moist.     Pharynx: Oropharynx is clear.  Eyes:     Extraocular Movements: Extraocular movements intact.  Cardiovascular:     Rate and Rhythm: Normal rate and regular rhythm.     Pulses: Normal pulses.     Heart sounds: Normal heart sounds.  Pulmonary:     Effort: Pulmonary effort is normal.     Breath sounds: Normal breath sounds.  Musculoskeletal:     Cervical back: No tenderness.     Comments: Right lower extremity: Positive erythema.  Warm to touch.  Some swelling. Right foot: Erythematous and warm to touch  Lymphadenopathy:     Cervical: No cervical adenopathy.  Skin:     General: Skin is warm and dry.     Capillary Refill: Capillary refill takes less than 2 seconds.  Neurological:     General: No focal deficit present.     Mental Status: He is alert and oriented to person, place, and time.  Psychiatric:        Mood and Affect: Mood normal.        Behavior: Behavior normal.      ASSESSMENT & PLAN: A total of 45 minutes was spent with the patient and counseling/coordination of care regarding preparing for this visit, review of most recent office visit notes, review of recent emergency department visit notes, review of most recent chest imaging reports, review of multiple chronic medical conditions and their management, review of all medications, review of most recent bloodwork results, review of health maintenance items, education on nutrition, prognosis, documentation, and need for follow up.   Problem List Items Addressed This Visit       Cardiovascular and Mediastinum   Essential hypertension   BP Readings from  Last 3 Encounters:  10/17/23 120/82  10/04/23 (!) 133/99  10/04/23 132/66  Well-controlled hypertension Continue metoprolol  succinate 25 mg daily       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HCC)   Stable.  Recently visualized on CTA of the lungs        Endocrine   Hypothyroidism   Clinically euthyroid. Continue Synthroid  125 mcg daily        Other   SOB (shortness of breath)   Chronic condition.  Clinically stable.  No red flag signs or symptoms. Recent blood work results and recent report of CTA of the lungs reviewed with patient.      Bilateral leg edema   Still present Continue furosemide  40 mg daily No signs of acute congestive heart failure      Cellulitis of right lower extremity - Primary   Partially treated infection.  Still active. Took cephalexin  4 times a day for 5 days.  Finished 1 week ago. Recommend to start Duricef 500 mg twice a day for 7 more days. Needs to follow-up with PCP in 1 to 2 weeks. ED precautions  given Advised to contact the office or send us  a message if no better or worse during the next several days      Relevant Medications   cefadroxil  (DURICEF) 500 MG capsule   Patient Instructions  Cellulitis, Adult  Cellulitis is a skin infection. The infected area is often warm, red, swollen, and sore. It occurs most often on the legs, feet, and toes, but can happen on any part of the body. This condition can be life-threatening without treatment. It is very important to get treated right away. What are the causes? This condition is caused by bacteria. The bacteria enter through a break in the skin, such as: A cut. A burn. A bug bite. An animal bite. An open sore. A crack. What increases the risk? Having a weak body's defense system (immune system). Being older than 77 years old. Having a blood sugar problem (diabetes). Having a long-term liver disease (cirrhosis) or kidney disease. Being very overweight (obese). Having a skin problem, such as: An itchy rash. A rash caused by a fungus. A rash with blisters. Slow movement of blood in the veins (venous stasis). Fluid buildup below the skin (edema). This condition is more likely to occur in people who: Have open cuts, burns, bites, or scrapes on the skin. Have been treated with high-energy rays (radiation). Use IV drugs. What are the signs or symptoms? Skin that: Looks red or purple, or slightly darker than your usual skin color. Has streaks. Has spots. Is swollen. Is sore or painful when you touch it. Is warm. A fever. Chills. Blisters. Tiredness (fatigue). How is this treated? Medicines to treat infections or allergies. Rest. Placing cold or warm cloths on the skin. Staying in the hospital, if the condition is very bad. You may need medicines through an IV. Follow these instructions at home: Medicines Take over-the-counter and prescription medicines only as told by your doctor. If you were prescribed  antibiotics, take them as told by your doctor. Do not stop using them even if you start to feel better. General instructions Drink enough fluid to keep your pee (urine) pale yellow. Do not touch or rub the infected area. Raise (elevate) the infected area above the level of your heart while you are sitting or lying down. Return to your normal activities when your doctor says that it is safe. Place cold or warm cloths on the area  as told by your doctor. Keep all follow-up visits. Your doctor will need to make sure that a more serious infection is not developing. Contact a doctor if: You have a fever. You do not start to get better after 1-2 days of treatment. Your bone or joint under the infected area starts to hurt after the skin has healed. Your infection comes back in the same area or another area. Signs of this may include: You have a swollen bump in the area. Your red area gets larger, turns dark in color, or hurts more. You have more fluid coming from the wound. Pus or a bad smell develops in your infected area. You have more pain. You feel sick and have muscle aches and weakness. You develop vomiting or watery poop that will not go away. Get help right away if: You see red streaks coming from the area. You notice the skin turns purple or black and falls off. These symptoms may be an emergency. Get help right away. Call 911. Do not wait to see if the symptoms will go away. Do not drive yourself to the hospital. This information is not intended to replace advice given to you by your health care provider. Make sure you discuss any questions you have with your health care provider. Document Revised: 02/09/2022 Document Reviewed: 02/09/2022 Elsevier Patient Education  2024 Elsevier Inc.    Maryagnes Small, MD [facility]

## 2023-10-17 NOTE — Assessment & Plan Note (Signed)
 Chronic condition.  Clinically stable.  No red flag signs or symptoms. Recent blood work results and recent report of CTA of the lungs reviewed with patient.

## 2023-10-17 NOTE — Assessment & Plan Note (Signed)
 Stable.  Recently visualized on CTA of the lungs

## 2023-10-17 NOTE — Assessment & Plan Note (Signed)
 Still present Continue furosemide  40 mg daily No signs of acute congestive heart failure

## 2023-10-17 NOTE — Patient Instructions (Signed)
 Cellulitis, Adult    Cellulitis is a skin infection. The infected area is often warm, red, swollen, and sore. It occurs most often on the legs, feet, and toes, but can happen on any part of the body.  This condition can be life-threatening without treatment. It is very important to get treated right away.  What are the causes?  This condition is caused by bacteria. The bacteria enter through a break in the skin, such as:  A cut.  A burn.  A bug bite.  An animal bite.  An open sore.  A crack.  What increases the risk?  Having a weak body's defense system (immune system).  Being older than 77 years old.  Having a blood sugar problem (diabetes).  Having a long-term liver disease (cirrhosis) or kidney disease.  Being very overweight (obese).  Having a skin problem, such as:  An itchy rash.  A rash caused by a fungus.  A rash with blisters.  Slow movement of blood in the veins (venous stasis).  Fluid buildup below the skin (edema).  This condition is more likely to occur in people who:  Have open cuts, burns, bites, or scrapes on the skin.  Have been treated with high-energy rays (radiation).  Use IV drugs.  What are the signs or symptoms?  Skin that:  Looks red or purple, or slightly darker than your usual skin color.  Has streaks.  Has spots.  Is swollen.  Is sore or painful when you touch it.  Is warm.  A fever.  Chills.  Blisters.  Tiredness (fatigue).  How is this treated?  Medicines to treat infections or allergies.  Rest.  Placing cold or warm cloths on the skin.  Staying in the hospital, if the condition is very bad. You may need medicines through an IV.  Follow these instructions at home:  Medicines  Take over-the-counter and prescription medicines only as told by your doctor.  If you were prescribed antibiotics, take them as told by your doctor. Do not stop using them even if you start to feel better.  General instructions  Drink enough fluid to keep your pee (urine) pale yellow.  Do not touch or rub the  infected area.  Raise (elevate) the infected area above the level of your heart while you are sitting or lying down.  Return to your normal activities when your doctor says that it is safe.  Place cold or warm cloths on the area as told by your doctor.  Keep all follow-up visits. Your doctor will need to make sure that a more serious infection is not developing.  Contact a doctor if:  You have a fever.  You do not start to get better after 1-2 days of treatment.  Your bone or joint under the infected area starts to hurt after the skin has healed.  Your infection comes back in the same area or another area. Signs of this may include:  You have a swollen bump in the area.  Your red area gets larger, turns dark in color, or hurts more.  You have more fluid coming from the wound.  Pus or a bad smell develops in your infected area.  You have more pain.  You feel sick and have muscle aches and weakness.  You develop vomiting or watery poop that will not go away.  Get help right away if:  You see red streaks coming from the area.  You notice the skin turns purple or black and falls  off.  These symptoms may be an emergency. Get help right away. Call 911.  Do not wait to see if the symptoms will go away.  Do not drive yourself to the hospital.  This information is not intended to replace advice given to you by your health care provider. Make sure you discuss any questions you have with your health care provider.  Document Revised: 02/09/2022 Document Reviewed: 02/09/2022  Elsevier Patient Education  2024 [employer].

## 2023-10-17 NOTE — Assessment & Plan Note (Signed)
 Clinically euthyroid. Continue Synthroid 125 mcg daily.

## 2023-10-17 NOTE — Assessment & Plan Note (Signed)
 BP Readings from Last 3 Encounters:  10/17/23 120/82  10/04/23 (!) 133/99  10/04/23 132/66  Well-controlled hypertension Continue metoprolol  succinate 25 mg daily

## 2023-10-24 ENCOUNTER — Other Ambulatory Visit: Payer: Self-pay | Admitting: Physician Assistant

## 2023-10-31 NOTE — Progress Notes (Unsigned)
 error

## 2023-11-01 ENCOUNTER — Ambulatory Visit: Payer: Medicare Other | Attending: Nurse Practitioner | Admitting: Nurse Practitioner

## 2023-11-01 ENCOUNTER — Encounter: Payer: Self-pay | Admitting: Nurse Practitioner

## 2023-11-01 VITALS — BP 116/62 | HR 75 | Ht 70.0 in | Wt 259.0 lb

## 2023-11-01 DIAGNOSIS — I2510 Atherosclerotic heart disease of native coronary artery without angina pectoris: Secondary | ICD-10-CM

## 2023-11-01 DIAGNOSIS — E785 Hyperlipidemia, unspecified: Secondary | ICD-10-CM | POA: Diagnosis not present

## 2023-11-01 DIAGNOSIS — I10 Essential (primary) hypertension: Secondary | ICD-10-CM | POA: Diagnosis not present

## 2023-11-01 DIAGNOSIS — R600 Localized edema: Secondary | ICD-10-CM

## 2023-11-01 DIAGNOSIS — R0602 Shortness of breath: Secondary | ICD-10-CM

## 2023-11-01 NOTE — Patient Instructions (Signed)
 Medication Instructions:  Your physician recommends that you continue on your current medications as directed. Please refer to the Current Medication list given to you today. *If you need a refill on your cardiac medications before your next appointment, please call your pharmacy*  Lab Work: None ordered If you have labs (blood work) drawn today and your tests are completely normal, you will receive your results only by: MyChart Message (if you have MyChart) OR A paper copy in the mail If you have any lab test that is abnormal or we need to change your treatment, we will call you to review the results.  Testing/Procedures: Your physician has requested that you have an echocardiogram. Echocardiography is a painless test that uses sound waves to create images of your heart. It provides your doctor with information about the size and shape of your heart and how well your heart's chambers and valves are working. This procedure takes approximately one hour. There are no restrictions for this procedure. Please do NOT wear cologne, perfume, aftershave, or lotions (deodorant is allowed). Please arrive 15 minutes prior to your appointment time.  Please note: We ask at that you not bring children with you during ultrasound (echo/ vascular) testing. Due to room size and safety concerns, children are not allowed in the ultrasound rooms during exams. Our front office staff cannot provide observation of children in our lobby area while testing is being conducted. An adult accompanying a patient to their appointment will only be allowed in the ultrasound room at the discretion of the ultrasound technician under special circumstances. We apologize for any inconvenience.  Your physician has requested that you have a lexiscan myoview. For further information please visit [email or web address] Please follow instruction sheet, as given.   Follow-Up: At [facility], you and your health needs are our  priority.  As part of our continuing mission to provide you with exceptional heart care, our providers are all part of one team.  This team includes your primary Cardiologist (physician) and Advanced Practice Providers or APPs (Physician Assistants and Nurse Practitioners) who all work together to provide you with the care you need, when you need it.  Your next appointment:   6 month(s)  Provider:   Ola Berger, MD    We recommend signing up for the patient portal called "MyChart".  Sign up information is provided on this After Visit Summary.  MyChart is used to connect with patients for Virtual Visits (Telemedicine).  Patients are able to view lab/test results, encounter notes, upcoming appointments, etc.  Non-urgent messages can be sent to your provider as well.   To learn more about what you can do with MyChart, go to [email or web address].   Other Instructions

## 2023-11-01 NOTE — Progress Notes (Signed)
 Cardiology Office Note    Patient Name: [name] [name] Date of Encounter: 11/01/2023  Primary Care Provider:  Arcadio Knuckles, [name] Primary Cardiologist:  [name] [name] Primary Electrophysiologist: None   Past Medical History    Past Medical History:  Diagnosis Date   Arthritis    CAD (coronary artery disease)    cath 3/10: mLAD 30%, pRCA 30%, EF 55-60% [name] [name] cardiologist  most recent  stress stress done ~ 2 years ago with [name] [name]   Complication of anesthesia    "he usually gets an ileus after back OR"    Depression    Dyspnea    -PFTs compeltely normal 03/20/08 including DLc0   Environmental allergies    Dust, Smoke   GERD (gastroesophageal reflux disease)    HTN (hypertension)    Hypothyroidism    Low testosterone     Observed sleep apnea    can't wear cpap   Polycythemia    PONV (postoperative nausea and vomiting)    Pulmonary nodule 06/13/2012   Rosacea    Sleep apnea    Most recent sleep study 2010; records at [name] [name] office    History of Present Illness  [name] [name] is a 78 y.o. male with a PMH of nonobstructive CAD, HTN, HLD, and OSA, thoracic aneurysm, hypothyroidism, GERD, venous insufficiency who presents today for 55-month follow-up.  [name] [name] has been followed by [name] [name] for management of CAD.  He has a history of mild CAD by cath 08/2008 with 30% RCA stenosis and 30% LAD stenosis.  Of multiple back and neck surgeries and falls often.  He experienced a fall in 10/2018 and wore an event monitor that showed AF.  He was deemed not a good candidate for anticoagulation due to frequent falls.  He wore a repeat monitor that showed no evidence of AF.  He was last seen by [name] [name] on 04/29/2023 and reported no episodes of chest pain with no evidence of palpitations.  His blood pressure was well-controlled and noticed some dizziness with Tegretol .  He was seen in the ED on 10/04/23 with complaint of chest pain.  He was found to have elevated troponin and D-dimer with 2+  pitting edema in his lower extremities.  CTA of the chest that was normal with no evidence of DVT.  CT was also completed showing no acute abnormalities.  He was provided antibiotics for right lower extremity cellulitis and discharged in stable condition.  [name] [name] presents today for 22-month follow-up and post ED evaluation. He reports experiencing shortness of breath, particularly when walking from the parking deck, but no current chest pain or palpitations. He was seen in the emergency room on April 8th for chest pain, where a troponin test was performed, and a CT scan ruled out embolism but showed mild aortic dilation. He has a history of a bifascicular block and premature ventricular contractions, but does not feel any skipped or hard beats. He was diagnosed with atrial fibrillation in 2020, but is not on blood thinners due to a history of falls. He continues to experience falls, the most recent being at a service station where he injured his elbow and back. He reports ongoing back pain and has had four back surgeries in the past. He has not had any head injuries from his falls. He has a history of cellulitis in the right leg, treated with antibiotics for six weeks, but reports persistent swelling and wheezing. He was diagnosed with acute bronchitis and continues  to have a productive cough with mucus, but no fever or chills. He has lost weight, currently weighing 258 pounds, down from 262 pounds in April. He reports a history of a cracked rib from ten years ago, which he believes has not healed. He also mentions a past diagnosis of cysts in his leg and a pinched nerve in his neck, which he believes affects his gait and causes leg swelling. He takes gabapentin  for neck pain. He has a history of trigeminal neuralgia, with right-sided facial tingling and numbness, previously treated with medication. He reports that the symptoms have returned, though less severe than before. He experiences mild left-sided  chest pain, which he attributes to indigestion, and takes generic Prilosec, which he reports is effective. He has a history of a stroke, diagnosed by a doctor who noted it on a brain x-Essie. Patient denies chest pain, palpitations, dyspnea, PND, orthopnea, nausea, vomiting, dizziness, syncope, edema, weight gain, or early satiety.  Discussed the use of AI scribe software for clinical note transcription with the patient, who gave verbal consent to proceed.  History of Present Illness     Review of Systems  Please see the history of present illness.    All other systems reviewed and are otherwise negative except as noted above.  Physical Exam    Wt Readings from Last 3 Encounters:  11/01/23 259 lb (117.5 kg)  10/17/23 258 lb 6 oz (117.2 kg)  10/13/23 262 lb (118.8 kg)   VS: Vitals:   11/01/23 0935  BP: 116/62  Pulse: 75  SpO2: 95%  ,Body mass index is 37.16 kg/m. GEN: Well nourished, well developed in no acute distress Neck: No JVD; No carotid bruits Pulmonary: Clear to auscultation without rales, wheezing or rhonchi  Cardiovascular: Normal rate. Regular rhythm. Normal S1. Normal S2.   Murmurs: There is no murmur.  ABDOMEN: Soft, non-tender, non-distended EXTREMITIES:  No edema; No deformity   EKG/LABS/ Recent Cardiac Studies   ECG personally reviewed by me today -none completed today  Risk Assessment/Calculations:    CHA2DS2-VASc Score = 6   This indicates a 9.7% annual risk of stroke. The patient's score is based upon: CHF History: 0 HTN History: 1 Diabetes History: 0 Stroke History: 2 Vascular Disease History: 1 Age Score: 2 Gender Score: 0         Lab Results  Component Value Date   WBC 8.4 10/04/2023   HGB 15.1 10/04/2023   HCT 46.1 10/04/2023   MCV 96.2 10/04/2023   PLT 229 10/04/2023   Lab Results  Component Value Date   CREATININE 0.96 10/04/2023   BUN 20 10/04/2023   NA 135 10/04/2023   K 3.9 10/04/2023   CL 101 10/04/2023   CO2 24  10/04/2023   Lab Results  Component Value Date   CHOL 120 03/03/2023   HDL 35.00 (L) 03/03/2023   LDLCALC 55 03/03/2023   TRIG 146.0 03/03/2023   CHOLHDL 3 03/03/2023    Lab Results  Component Value Date   HGBA1C 6.1 10/04/2023   Assessment & Plan    1.  Coronary artery disease/SOB: -s/p mild CAD by cath 08/2008 with 30% RCA stenosis and 30% LAD stenosis.  - He was seen in the ED for complaint of chest pain on 09/2023 mild elevation to troponins and D-dimer. - Today patient reports no chest pain or shortness of breath. - Patient will complete a Lexiscan Myoview to rule out ischemia. -Repeat 2D echo to evaluate heart structure and valve function. -  Continue current GDMT with ASA 81 mg, Toprol  25 mg, Zocor  20 mg - ED precautions discussed and patient aware to go to the emergency room if chest pain returns.  2.  Essential hypertension: - Patient's blood pressure today was stable at 116/62 - Continue Toprol -XL 25 mg daily  3.  Hyperlipidemia: - Patient's last LDL cholesterol was 55 - Continue Zocor  20 mg daily  4.  History of lower extremity edema: - Patient has right lower extremity mottling and edema and was recently treated with antibiotics for cellulitis. - Patient's left leg is normal size with no mottling or edema present. - Continue Lasix  40 mg daily   Disposition: Follow-up with [name] [name] or APP in 6 months Informed Consent   Shared Decision Making/Informed Consent The risks [chest pain, shortness of breath, cardiac arrhythmias, dizziness, blood pressure fluctuations, myocardial infarction, stroke/transient ischemic attack, nausea, vomiting, allergic reaction, radiation exposure, metallic taste sensation and life-threatening complications (estimated to be 1 in 10,000)], benefits (risk stratification, diagnosing coronary artery disease, treatment guidance) and alternatives of a nuclear stress test were discussed in detail with [name] [name] and he agrees to  proceed.        Signed, Francene Ing, Retha Cast, NP 11/01/2023, 12:43 PM [facility]

## 2023-11-28 ENCOUNTER — Telehealth (HOSPITAL_COMMUNITY): Payer: Self-pay

## 2023-11-28 NOTE — Telephone Encounter (Signed)
 Spoke with the patient's wife. She stated that he would be here for his test. S.Fumi Guadron CCT

## 2023-12-01 ENCOUNTER — Other Ambulatory Visit: Payer: Self-pay | Admitting: Nurse Practitioner

## 2023-12-01 DIAGNOSIS — R600 Localized edema: Secondary | ICD-10-CM

## 2023-12-01 DIAGNOSIS — I2510 Atherosclerotic heart disease of native coronary artery without angina pectoris: Secondary | ICD-10-CM

## 2023-12-01 DIAGNOSIS — I10 Essential (primary) hypertension: Secondary | ICD-10-CM

## 2023-12-01 DIAGNOSIS — E785 Hyperlipidemia, unspecified: Secondary | ICD-10-CM

## 2023-12-06 ENCOUNTER — Ambulatory Visit (HOSPITAL_COMMUNITY)
Admission: RE | Admit: 2023-12-06 | Discharge: 2023-12-06 | Disposition: A | Discharge status: Home / Self Care | Source: Ambulatory Visit | Attending: Cardiovascular Disease | Admitting: Cardiovascular Disease

## 2023-12-06 ENCOUNTER — Ambulatory Visit (HOSPITAL_BASED_OUTPATIENT_CLINIC_OR_DEPARTMENT_OTHER)
Admission: RE | Admit: 2023-12-06 | Discharge: 2023-12-06 | Disposition: A | Discharge status: Home / Self Care | Source: Ambulatory Visit | Attending: Nurse Practitioner | Admitting: Nurse Practitioner

## 2023-12-06 ENCOUNTER — Ambulatory Visit: Payer: Self-pay | Admitting: Nurse Practitioner

## 2023-12-06 DIAGNOSIS — E785 Hyperlipidemia, unspecified: Secondary | ICD-10-CM | POA: Diagnosis present

## 2023-12-06 DIAGNOSIS — R600 Localized edema: Secondary | ICD-10-CM | POA: Diagnosis present

## 2023-12-06 DIAGNOSIS — I10 Essential (primary) hypertension: Secondary | ICD-10-CM

## 2023-12-06 DIAGNOSIS — I2510 Atherosclerotic heart disease of native coronary artery without angina pectoris: Secondary | ICD-10-CM

## 2023-12-06 DIAGNOSIS — I7121 Aneurysm of the ascending aorta, without rupture: Secondary | ICD-10-CM

## 2023-12-06 LAB — ECHOCARDIOGRAM COMPLETE
Area-P 1/2: 2.87 cm2
Height: 70 in
S' Lateral: 2.4 cm
Weight: 4064 [oz_av]

## 2023-12-06 LAB — MYOCARDIAL PERFUSION IMAGING
LV dias vol: 105 mL (ref 62–150)
LV sys vol: 31 mL
Nuc Stress EF: 70 %
Peak HR: 69 {beats}/min
Rest HR: 56 {beats}/min
Rest Nuclear Isotope Dose: 10.5 mCi
SDS: 0
SRS: 2
SSS: 1
ST Depression (mm): 0 mm
Stress Nuclear Isotope Dose: 31.8 mCi
TID: 1

## 2023-12-06 MED ORDER — TECHNETIUM TC 99M TETROFOSMIN IV KIT
10.5000 | PACK | Freq: Once | INTRAVENOUS | Status: AC | PRN
  Administered 2023-12-06: 10.5 via INTRAVENOUS

## 2023-12-06 MED ORDER — REGADENOSON 0.4 MG/5ML IV SOLN
0.4000 mg | Freq: Once | INTRAVENOUS | Status: AC
  Administered 2023-12-06: 0.4 mg via INTRAVENOUS

## 2023-12-06 MED ORDER — TECHNETIUM TC 99M TETROFOSMIN IV KIT
31.8000 | PACK | Freq: Once | INTRAVENOUS | Status: AC | PRN
  Administered 2023-12-06: 31.8 via INTRAVENOUS

## 2023-12-06 MED ORDER — PERFLUTREN LIPID MICROSPHERE
1.0000 mL | INTRAVENOUS | Status: AC | PRN
  Administered 2023-12-06: 1 mL via INTRAVENOUS

## 2023-12-06 MED ORDER — REGADENOSON 0.4 MG/5ML IV SOLN
INTRAVENOUS | Status: AC
  Filled 2023-12-06: qty 5

## 2023-12-10 ENCOUNTER — Ambulatory Visit: Payer: Self-pay | Admitting: Nurse Practitioner

## 2023-12-15 ENCOUNTER — Other Ambulatory Visit: Payer: Self-pay | Admitting: Physician Assistant

## 2023-12-27 ENCOUNTER — Ambulatory Visit (HOSPITAL_COMMUNITY)

## 2024-01-02 ENCOUNTER — Ambulatory Visit: Admitting: Internal Medicine

## 2024-02-16 ENCOUNTER — Ambulatory Visit (HOSPITAL_BASED_OUTPATIENT_CLINIC_OR_DEPARTMENT_OTHER): Admitting: Pulmonary Disease

## 2024-02-16 ENCOUNTER — Encounter (HOSPITAL_BASED_OUTPATIENT_CLINIC_OR_DEPARTMENT_OTHER): Payer: Self-pay | Admitting: Pulmonary Disease

## 2024-02-16 VITALS — BP 138/85 | HR 74 | Ht 70.0 in | Wt 257.0 lb

## 2024-02-16 DIAGNOSIS — R0609 Other forms of dyspnea: Secondary | ICD-10-CM | POA: Diagnosis not present

## 2024-02-16 DIAGNOSIS — Z77090 Contact with and (suspected) exposure to asbestos: Secondary | ICD-10-CM

## 2024-02-16 DIAGNOSIS — J42 Unspecified chronic bronchitis: Secondary | ICD-10-CM

## 2024-02-16 DIAGNOSIS — I5032 Chronic diastolic (congestive) heart failure: Secondary | ICD-10-CM

## 2024-02-16 DIAGNOSIS — G4730 Sleep apnea, unspecified: Secondary | ICD-10-CM

## 2024-02-16 DIAGNOSIS — R918 Other nonspecific abnormal finding of lung field: Secondary | ICD-10-CM

## 2024-02-16 MED ORDER — ALBUTEROL SULFATE HFA 108 (90 BASE) MCG/ACT IN AERS: 2.0000 | INHALATION_SPRAY | RESPIRATORY_TRACT | 11 refills | Status: AC | PRN

## 2024-02-16 NOTE — Progress Notes (Signed)
 New Patient Pulmonology Office Visit   Subjective:  Patient ID: [name] [name], male    DOB: 07/28/1945  MRN: [id number]  Referred by: Joshua Debby CROME, MD  CC:  Chief Complaint  Patient presents with   [facility] FOLLOW UP     HPI [name] [name] is a 78 y.o. male with with a PMH significant for HLD, hypothyroidism, GERD CAD, who presents for initial evaluation.  A long time ago noticed that he was having trouble breathing and coughing up mucous. He did not think much of it. Probably years ago. Over the past few years it get a lot worse. Gradually worsening in the last few years. He saw Dr. Darlean in 2009 and he did not find anything wrong with his lung aside from some asbestos. He gets dyspena on exertion like going up steps, walking long distances, pushing the rollator. He has to sit in a chair to shower. Sometimes rest dyspnea. It gets easier when he is not moving around. He coughs every now and then and gets mucous. His wife says he snores and chokes/gurgles. Negative orthopnea, PND. He has some swelling in his legs. A lot mucous every day. Sticky and hard to get out. He gets a weird feeling in his chest only on exertion. That feeling improves when he sits down. He lost around 30 lbs. It did not help breathing. Wheezing from times to times. He previously had bronchitis. He was previously given Trelegy and it helped slightly. Albuterol  rescue inhaler not using.  Diagnosed with sleep apnea in the past and had tonsillectomy and then stopped using CPAP. ESS 14, STOP BANG 7  PMH:  - HTN - non obstructive CAD - Hypothyroidism - HLD  PSH:  - 4 back surgeries - 2 knee surgeries - Tonsillectomy  Medications:  - Lasix  40 mg daily  Allergies: penicillin, sulfa, codeine  Social Hx: - Smoking: quit 40 years ago, 17 years x 2 packs per day  - Second hand smoking: none  - Vaping: none  - Alcohol: none  - Illicit substances: none  - Indoor emission from household  combustion: none  - Hobbies (e.g. wood work, Surveyor, quantity, bird breeding etc...): none - Environmental (e.g. 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 - Holiday representative workers, Medical sales representative, Therapist, sports, railroad, English as a second language teacher  [ ]  Ionizing radiation - uranium mining  [ ]  Vinyl chloride - pulp & paper workers  [ ]  Arsenic - smelting of ores, Chief Executive Officer, wood preservation  [ ]  Beryllium - Archivist workers, Geophysical data processor, Pensions consultant workers, jewelers  [ ]  chromium - Financial trader, welding, tanning industries  [ ]  Nickel - Chief Strategy Officer, Naval architect, Physiological scientist, glass workers, Health and safety inspector, metal workers   Family Hx:  - Lung disease: none   - Cancer: none       No data to display          ROS  Allergies: Penicillins, Codeine, and Sulfonamide derivatives  Current Outpatient Medications:    albuterol  (VENTOLIN  HFA) 108 (90 Base) MCG/ACT inhaler, Inhale 2 puffs into the lungs every 4 (four) hours as needed for wheezing or shortness of breath., Disp: 1 each, Rfl: 11   aspirin  EC 81 MG tablet, Take 1 tablet (81 mg total) by mouth 2 (two) times daily., Disp: 30 tablet, Rfl:    DULoxetine  (CYMBALTA ) 30 MG capsule, TAKE  1 CAPSULE BY MOUTH DAILY, Disp: 90 capsule, Rfl: 3   furosemide  (LASIX ) 40 MG tablet, TAKE 1 TABLET BY MOUTH DAILY, Disp: 90 tablet, Rfl: 3   gabapentin  (NEURONTIN ) 300 MG capsule, Take 1 capsule (300 mg total) by mouth 3 (three) times daily., Disp: 270 capsule, Rfl: 1   levothyroxine  (SYNTHROID ) 125 MCG tablet, TAKE 1 TABLET BY MOUTH DAILY, Disp: 90 tablet, Rfl: 3   metoprolol  succinate (TOPROL -XL) 25 MG 24 hr tablet, TAKE 1 TABLET BY MOUTH ONCE  DAILY, Disp: 90 tablet, Rfl: 0   Omega-3 Fatty Acids (SUPER OMEGA-3) 1000 MG CAPS, Take 1 capsule by mouth daily at 6 (six) AM., Disp: , Rfl:    omeprazole  (PRILOSEC) 40 MG capsule, TAKE 1  CAPSULE BY MOUTH DAILY, Disp: 90 capsule, Rfl: 3   polyethylene glycol (MIRALAX  / GLYCOLAX ) 17 g packet, Take 17 g by mouth daily as needed for moderate constipation., Disp: , Rfl:    potassium chloride  SA (KLOR-CON  M) 20 MEQ tablet, TAKE 1 TABLET BY MOUTH DAILY, Disp: 90 tablet, Rfl: 2   simvastatin  (ZOCOR ) 20 MG tablet, TAKE 1 TABLET BY MOUTH DAILY AT  6 PM., Disp: 90 tablet, Rfl: 3 Past Medical History:  Diagnosis Date   Arthritis    CAD (coronary artery disease)    cath 3/10: mLAD 30%, pRCA 30%, EF 55-60% Dr. Okey cardiologist  most recent  stress stress done ~ 2 years ago with Dr. Okey   Complication of anesthesia    he usually gets an ileus after back OR    Depression    Dyspnea    -PFTs compeltely normal 03/20/08 including DLc0   Environmental allergies    Dust, Smoke   GERD (gastroesophageal reflux disease)    HTN (hypertension)    Hypothyroidism    Low testosterone     Observed sleep apnea    can't wear cpap   Polycythemia    PONV (postoperative nausea and vomiting)    Pulmonary nodule 06/13/2012   Rosacea    Sleep apnea    Most recent sleep study 2010; records at [facility]   Past Surgical History:  Procedure Laterality Date   BACK SURGERY  2005; 06/2006; 01/2007; 04/2009   CARDIAC CATHETERIZATION  08/2008   CATARACT EXTRACTION, BILATERAL  2010   CERVICAL FUSION  04/2002   ?C3-4   COLONOSCOPY  12/2005   normal screening study.    ESOPHAGOGASTRODUODENOSCOPY  multiple   last 07/2012 GERD esophagitis, 48 Fr dilation   ESOPHAGOGASTRODUODENOSCOPY N/A 09/19/2015   Procedure: ESOPHAGOGASTRODUODENOSCOPY (EGD);  Surgeon: Gwendlyn ONEIDA Buddy, MD;  Location: [facility] ENDOSCOPY;  Service: Endoscopy;  Laterality: N/A;   KNEE ARTHROSCOPY WITH MEDIAL MENISECTOMY Right 05/14/2015   Procedure: RIGHT KNEE ARTHROSCOPY CHONDROPLASTY, PARTIAL  MEDIAL MENISECTOMY;  Surgeon: Toribio Silos, MD;  Location: [facility];  Service: Orthopedics;  Laterality: Right;   left knee  replacement     left shoudler clean     LUMBAR DISC SURGERY  06/2006   L3   MENISCUS REPAIR  08/2008   left   NECK SURGERY     POSTERIOR FUSION LUMBAR SPINE  2005; 01/2007; 04/2009   L5; L3-4; L2-3   PROSTATE SURGERY  2000   SAVORY DILATION N/A 09/19/2015   Procedure: SAVORY DILATION;  Surgeon: Gwendlyn ONEIDA Buddy, MD;  Location: [facility] ENDOSCOPY;  Service: Endoscopy;  Laterality: N/A;   SHOULDER OPEN ROTATOR CUFF REPAIR Left 1999   left   TONSILLECTOMY  09/21/11   TOTAL KNEE ARTHROPLASTY Left 01/2011  left   TOTAL KNEE ARTHROPLASTY Right 08/08/2015   Procedure: RIGHT TOTAL KNEE ARTHROPLASTY;  Surgeon: Toribio Silos, MD;  Location: [facility] OR;  Service: Orthopedics;  Laterality: Right;   TRANSURETHRAL RESECTION OF PROSTATE  2006   followed by surgery to get rid of clots   UVULOPALATOPHARYNGOPLASTY, TONSILLECTOMY AND SEPTOPLASTY  09/21/11   Deviated Septum   Family History  Problem Relation Age of Onset   Breast cancer Mother        dx. mid-70s   Colon polyps Mother        86+ colon polyps per yr   Diabetes Mother    Breast cancer Sister 62   Other Sister        + MUTYH mutation   Diabetes Sister        borderline   Heart Problems Sister    Breast cancer Sister        dx. 40s   Heart attack Father    Alzheimer's disease Father    Lung cancer Paternal Uncle        x4   Throat cancer Maternal Uncle        mother's maternal half-brother; dx 49s; +EtOH   Liver cancer Maternal Uncle        mother's maternal half-brother; either liver cancer or cirrhosis   Cirrhosis Maternal Uncle        +EtOH   Stroke Paternal Grandmother    Heart attack Paternal Grandfather    Other Daughter        negative genetic testing in 2017   Parkinson's disease Other    Bipolar disorder Other    Lung cancer Paternal Uncle        (x3)   Lung cancer Paternal Uncle    Alzheimer's disease Cousin        paternal 1st cousin, d. 100   Heart attack Cousin    Anesthesia problems Neg Hx    Social History    Socioeconomic History   Marital status: Married    Spouse name: Erminio   Number of children: 3   Years of education: Not on file   Highest education level: 11th grade  Occupational History   Occupation: retired    Associate Professor: [employer]  Tobacco Use   Smoking status: Former    Current packs/day: 0.00    Average packs/day: 1 pack/day for 16.0 years (16.0 ttl pk-yrs)    Types: Cigarettes    Start date: 07/17/1958    Quit date: 07/17/1974    Years since quitting: 49.6   Smokeless tobacco: Former  Building services engineer status: Never Used  Substance and Sexual Activity   Alcohol use: No   Drug use: No   Sexual activity: Not on file  Other Topics Concern   Not on file  Social History Narrative   Area, 3 children.Retired from the city of [employer] caffeinated beverages daily   Home is one story with wife   Social Drivers of Corporate investment banker Strain: Low Risk  (10/13/2023)   Overall Financial Resource Strain (CARDIA)    Difficulty of Paying Living Expenses: Not very hard  Recent Concern: Financial Resource Strain - Medium Risk (10/03/2023)   Overall Financial Resource Strain (CARDIA)    Difficulty of Paying Living Expenses: Somewhat hard  Food Insecurity: No Food Insecurity (10/13/2023)   Hunger Vital Sign    Worried About Running Out of Food in the Last Year: Never true    Ran Out of Food in  the Last Year: Never true  Transportation Needs: No Transportation Needs (10/13/2023)   PRAPARE - Administrator, Civil Service (Medical): No    Lack of Transportation (Non-Medical): No  Physical Activity: Inactive (10/13/2023)   Exercise Vital Sign    Days of Exercise per Week: 0 days    Minutes of Exercise per Session: 0 min  Stress: No Stress Concern Present (10/13/2023)   [employer] of Occupational Health - Occupational Stress Questionnaire    Feeling of Stress : Not at all  Recent Concern: Stress - Stress Concern Present (10/03/2023)   [employer] of Occupational Health - Occupational Stress Questionnaire    Feeling of Stress : To some extent  Social Connections: Socially Isolated (10/13/2023)   Social Connection and Isolation Panel    Frequency of Communication with Friends and Family: Never    Frequency of Social Gatherings with Friends and Family: Never    Attends Religious Services: Never    Database administrator or Organizations: No    Attends Banker Meetings: Never    Marital Status: Married  Catering manager Violence: Not At Risk (10/13/2023)   Humiliation, Afraid, Rape, and Kick questionnaire    Fear of Current or Ex-Partner: No    Emotionally Abused: No    Physically Abused: No    Sexually Abused: No       Objective:  BP 138/85 (BP Location: Left Arm, Patient Position: Sitting)   Pulse 74   Ht 5' 10 (1.778 m)   Wt 257 lb (116.6 kg)   SpO2 95%   BMI 36.88 kg/m  Wt Readings from Last 3 Encounters:  02/16/24 257 lb (116.6 kg)  12/06/23 254 lb (115.2 kg)  11/01/23 259 lb (117.5 kg)   BMI Readings from Last 3 Encounters:  02/16/24 36.88 kg/m  12/06/23 36.45 kg/m  11/01/23 37.16 kg/m   SpO2 Readings from Last 3 Encounters:  02/16/24 95%  11/01/23 95%  10/17/23 98%    Physical Exam General: obese gentleman, not in any distress Eyes: PERRL, no scleral icterus ENMT: oropharynx clear, good dentition, no oral lesions, mallampati score IV Skin: warm, intact, no rashes Neck: JVD +, ROM and lymph node assessment normal, neck circ 19 inches CV: RRR, soft systolic murmur, nl S1 and S2, 1-2+ pitting peripheral edema worse in RLE compared to LLE Resp: clear to auscultation bilaterally, no wheezes, rales, or rhonchi, normal effort, no clubbing/cyanosis Abdom: Normoactive bowel sounds, soft, nontender, nondistended, no hepatosplenomegaly Ext: edema Neuro: Awake alert oriented to person place time and situation  Diagnostic Review:  Last CBC Lab Results  Component Value Date   WBC 8.4  10/04/2023   HGB 15.1 10/04/2023   HCT 46.1 10/04/2023   MCV 96.2 10/04/2023   MCH 31.5 10/04/2023   RDW 13.2 10/04/2023   PLT 229 10/04/2023   Last metabolic panel Lab Results  Component Value Date   GLUCOSE 99 10/04/2023   NA 135 10/04/2023   K 3.9 10/04/2023   CL 101 10/04/2023   CO2 24 10/04/2023   BUN 20 10/04/2023   CREATININE 0.96 10/04/2023   GFRNONAA >60 10/04/2023   CALCIUM 9.5 10/04/2023   PHOS 2.3 02/23/2011   PROT 8.6 (H) 10/04/2023   ALBUMIN 4.4 10/04/2023   LABGLOB 3.7 01/27/2016   AGRATIO 1.1 01/27/2016   BILITOT 0.8 10/04/2023   ALKPHOS 85 10/04/2023   AST 19 10/04/2023   ALT 15 10/04/2023   ANIONGAP 10 10/04/2023   CTA Chest 09/2023: has  evidence of mosaic attenuation in all lobes as well as multiple pulmonary nodules all measuring 6 mm or less. He also has calcified pleural plaque bilaterally. There's evidence of bronchial wall thickening. These nodules have been present before on CT coronaries from 2023.  Myocardial perfusion scan 11/2023  TTE 11/2023: 1. Left ventricular ejection fraction, by estimation, is 55 to 60%. The  left ventricle has normal function. The left ventricle has no regional  wall motion abnormalities. Left ventricular diastolic parameters were  normal.   2. Right ventricular systolic function is normal. The right ventricular  size is normal. There is normal pulmonary artery systolic pressure.   3. The mitral valve is degenerative. No evidence of mitral valve  regurgitation. No evidence of mitral stenosis.   4. The aortic valve was not well visualized. Aortic valve regurgitation  is not visualized. No aortic stenosis is present.   5. Aortic Ascending aorta is structurally normal, with no evidence of  dilitation.   6. The inferior vena cava is normal in size with greater than 50%  respiratory variability, suggesting right atrial pressure of 3 mmHg.     Assessment & Plan:   Assessment & Plan Multiple pulmonary nodules The  patient has multiple bilateral pulmonary nodules measuring 6 mm at largest dimension. These nodules were previously present on CT chest in the past. Per Fleischner criteria nodules measuring 6-8 mm in a high risk patient will need repeat CT chest in 6-12 months. I ordered CT chest for end of October 2025. Dyspnea on exertion Likely multifactorial in the setting of morbid obesity, chronic diastolic heart failure and possible COPD. I advised patient to increase physical activity and continue to lose weight. He will continue lasix  40 mg daily which may need to be increased as he is hypervolemic on examination. I will defer that decision to PCP or cardiologist. Will obtain initial PFT to evaluate for COPD. Sleep-disordered breathing Patient with prior hx of OSA on CPAP. ESS 14 and STOP BANG 7. Mallampti IV and neck circ is 19 inches. I discussed with patient getting repeat polysomnogram to evaluate for OSA. I discussed the risk of stroke and CVD that comes with untreated OSA to patient. Patient would like to defer for now. Chronic diastolic heart failure (HCC) Continue lasix  40 mg daily Chronic bronchitis, unspecified chronic bronchitis type (HCC) Advised patient to start albuterol  MDI 2 puffs every 4 hours as needed whenever short of breath. Will put on maintenance inhaler if PFTs suggest obstructive lung disease. Asbestos exposure Patient previously in [employer] and has been exposed to asbestos. He does have bilateral pleural plaques on CT chest but no evidence of fibrotic changes. Will follow with chest imaging and PFTs as ordered above.  Orders Placed This Encounter  Procedures   CT Chest Wo Contrast   Pulmonary Function Test   I spent 60 minutes reviewing patient's chart including prior consultant notes, imaging, and PFTs as well as face-to-face with the patient, over half in discussion of the diagnosis and the importance of compliance with the treatment plan.  Return in about 3 months (around  05/18/2024).   Dayton Kenley, MD

## 2024-02-16 NOTE — Patient Instructions (Signed)
-   Please obtain CT chest in 03/2024 - Please get your breathing test done soon - You can use albuterol  inhaler 2 puffs every 4 hours as needed - Come back in 2-3 months.

## 2024-02-22 ENCOUNTER — Other Ambulatory Visit: Payer: Self-pay | Admitting: Physician Assistant

## 2024-02-23 MED ORDER — METOPROLOL SUCCINATE ER 25 MG PO TB24: 25.0000 mg | ORAL_TABLET | Freq: Every day | ORAL | 2 refills | Status: DC

## 2024-03-09 ENCOUNTER — Other Ambulatory Visit: Payer: Self-pay | Admitting: Internal Medicine

## 2024-03-09 DIAGNOSIS — G500 Trigeminal neuralgia: Secondary | ICD-10-CM

## 2024-03-16 ENCOUNTER — Telehealth: Payer: Self-pay | Admitting: Pulmonary Disease

## 2024-03-16 ENCOUNTER — Ambulatory Visit: Admitting: Internal Medicine

## 2024-03-16 DIAGNOSIS — R918 Other nonspecific abnormal finding of lung field: Secondary | ICD-10-CM

## 2024-03-16 DIAGNOSIS — R0609 Other forms of dyspnea: Secondary | ICD-10-CM

## 2024-03-16 DIAGNOSIS — J449 Chronic obstructive pulmonary disease, unspecified: Secondary | ICD-10-CM

## 2024-03-16 LAB — PULMONARY FUNCTION TEST
DL/VA % pred: 134 %
DL/VA: 5.31 ml/min/mmHg/L
DLCO cor % pred: 127 %
DLCO cor: 30.71 ml/min/mmHg
DLCO unc % pred: 127 %
DLCO unc: 30.71 ml/min/mmHg
FEF 25-75 Post: 2.74 L/s
FEF 25-75 Pre: 1.84 L/s
FEF2575-%Change-Post: 48 %
FEF2575-%Pred-Post: 134 %
FEF2575-%Pred-Pre: 90 %
FEV1-%Change-Post: 9 %
FEV1-%Pred-Post: 82 %
FEV1-%Pred-Pre: 75 %
FEV1-Post: 2.35 L
FEV1-Pre: 2.15 L
FEV1FVC-%Change-Post: -2 %
FEV1FVC-%Pred-Pre: 107 %
FEV6-%Change-Post: 12 %
FEV6-%Pred-Post: 83 %
FEV6-%Pred-Pre: 73 %
FEV6-Post: 3.1 L
FEV6-Pre: 2.75 L
FEV6FVC-%Change-Post: 0 %
FEV6FVC-%Pred-Post: 106 %
FEV6FVC-%Pred-Pre: 106 %
FVC-%Change-Post: 12 %
FVC-%Pred-Post: 77 %
FVC-%Pred-Pre: 69 %
FVC-Post: 3.11 L
FVC-Pre: 2.76 L
Post FEV1/FVC ratio: 76 %
Post FEV6/FVC ratio: 100 %
Pre FEV1/FVC ratio: 78 %
Pre FEV6/FVC Ratio: 99 %

## 2024-03-16 MED ORDER — STIOLTO RESPIMAT 2.5-2.5 MCG/ACT IN AERS: 2.0000 | INHALATION_SPRAY | Freq: Every day | RESPIRATORY_TRACT | 3 refills | Status: DC

## 2024-03-16 NOTE — Patient Instructions (Signed)
 pirometry pre/post and diffusion capacity performed today. Patient unable to keep pant frequency to complete lung volumes.

## 2024-03-16 NOTE — Telephone Encounter (Signed)
 Called patient to home phone and patient's wife answered. She said he was not there so I went ahead and informed her of result of PFTs. I suggested that he could trial LAMA/LABA inhaler for his early COPD. She noted understanding. Will send order to mail order pharmacy as requested.

## 2024-03-16 NOTE — Progress Notes (Signed)
 Spirometry pre/post and diffusion capacity performed today. Patient unable to keep pant frequency to complete lung volumes.

## 2024-03-22 ENCOUNTER — Telehealth (HOSPITAL_BASED_OUTPATIENT_CLINIC_OR_DEPARTMENT_OTHER): Payer: Self-pay

## 2024-03-22 NOTE — Telephone Encounter (Signed)
 Copied from CRM [id number]. Topic: Clinical - Lab/Test Results >> Mar 21, 2024  2:19 PM Essie A wrote: Reason for CRM: Patient had a PFT done on 03/16/24.  He would like to have a copy of the test results mailed to his home.  He would also like to get a call back from the nurse or the doctor to discuss the results.  Please call him at [phone].  Thanks.

## 2024-03-22 NOTE — Telephone Encounter (Signed)
 Called patient and explained everything to him.

## 2024-04-02 ENCOUNTER — Ambulatory Visit: Admitting: Internal Medicine

## 2024-04-02 ENCOUNTER — Encounter: Payer: Self-pay | Admitting: Internal Medicine

## 2024-04-02 VITALS — BP 136/78 | HR 76 | Temp 98.4°F | Ht 70.0 in | Wt 258.2 lb

## 2024-04-02 DIAGNOSIS — K5904 Chronic idiopathic constipation: Secondary | ICD-10-CM

## 2024-04-02 DIAGNOSIS — Z23 Encounter for immunization: Secondary | ICD-10-CM

## 2024-04-02 DIAGNOSIS — I10 Essential (primary) hypertension: Secondary | ICD-10-CM | POA: Diagnosis not present

## 2024-04-02 DIAGNOSIS — I517 Cardiomegaly: Secondary | ICD-10-CM

## 2024-04-02 DIAGNOSIS — E039 Hypothyroidism, unspecified: Secondary | ICD-10-CM

## 2024-04-02 DIAGNOSIS — E538 Deficiency of other specified B group vitamins: Secondary | ICD-10-CM | POA: Diagnosis not present

## 2024-04-02 DIAGNOSIS — R7303 Prediabetes: Secondary | ICD-10-CM

## 2024-04-02 DIAGNOSIS — I2510 Atherosclerotic heart disease of native coronary artery without angina pectoris: Secondary | ICD-10-CM

## 2024-04-02 DIAGNOSIS — E785 Hyperlipidemia, unspecified: Secondary | ICD-10-CM | POA: Diagnosis not present

## 2024-04-02 LAB — HEPATIC FUNCTION PANEL
ALT: 21 U/L (ref 0–53)
AST: 23 U/L (ref 0–37)
Albumin: 4.4 g/dL (ref 3.5–5.2)
Alkaline Phosphatase: 63 U/L (ref 39–117)
Bilirubin, Direct: 0.2 mg/dL (ref 0.0–0.3)
Total Bilirubin: 0.6 mg/dL (ref 0.2–1.2)
Total Protein: 8.2 g/dL (ref 6.0–8.3)

## 2024-04-02 LAB — TSH: TSH: 2.5 u[IU]/mL (ref 0.35–5.50)

## 2024-04-02 LAB — BASIC METABOLIC PANEL WITH GFR
BUN: 14 mg/dL (ref 6–23)
CO2: 24 meq/L (ref 19–32)
Calcium: 10.1 mg/dL (ref 8.4–10.5)
Chloride: 102 meq/L (ref 96–112)
Creatinine, Ser: 0.75 mg/dL (ref 0.40–1.50)
GFR: 87.22 mL/min (ref >60.00)
Glucose, Bld: 89 mg/dL (ref 70–99)
Potassium: 4.2 meq/L (ref 3.5–5.1)
Sodium: 139 meq/L (ref 135–145)

## 2024-04-02 LAB — CBC WITH DIFFERENTIAL/PLATELET
Basophils Absolute: 0 K/uL (ref 0.0–0.1)
Basophils Relative: 0.6 % (ref 0.0–3.0)
Eosinophils Absolute: 0.1 K/uL (ref 0.0–0.7)
Eosinophils Relative: 2.7 % (ref 0.0–5.0)
HCT: 45 % (ref 39.0–52.0)
Hemoglobin: 15 g/dL (ref 13.0–17.0)
Lymphocytes Relative: 28.2 % (ref 12.0–46.0)
Lymphs Abs: 1.4 K/uL (ref 0.7–4.0)
MCHC: 33.4 g/dL (ref 30.0–36.0)
MCV: 95.7 fl (ref 78.0–100.0)
Monocytes Absolute: 0.5 K/uL (ref 0.1–1.0)
Monocytes Relative: 10.9 % (ref 3.0–12.0)
Neutro Abs: 2.8 K/uL (ref 1.4–7.7)
Neutrophils Relative %: 57.6 % (ref 43.0–77.0)
Platelets: 249 K/uL (ref 150.0–400.0)
RBC: 4.7 Mil/uL (ref 4.22–5.81)
RDW: 13.5 % (ref 11.5–15.5)
WBC: 4.9 K/uL (ref 4.0–10.5)

## 2024-04-02 LAB — HEMOGLOBIN A1C: Hgb A1c MFr Bld: 6.2 % (ref 4.6–6.5)

## 2024-04-02 LAB — MAGNESIUM: Magnesium: 2 mg/dL (ref 1.5–2.5)

## 2024-04-02 NOTE — Progress Notes (Signed)
 "  Subjective:  Patient ID: [name] [name], male    DOB: [birth year]  Age: 78 y.o. MRN: [id number]  CC: Medical Management of Chronic Issues (6 month follow up )   HPI [name] [name] presents for f/up -  Discussed the use of AI scribe software for clinical note transcription with the patient, who gave verbal consent to proceed.  History of Present Illness [name] [name] is a 78 year old male with COPD who presents with shortness of breath and shoulder pain.  He experiences shortness of breath, which he attributes to his COPD, but describes it as 'not too bad' currently. He uses two inhalers, one in the morning and another as needed. No chest pain, hemoptysis, or phlegm, but he does report mucus production. He has not experienced any fever or chills.  He has constipation, which he describes as not severe. He takes medication for it, which works every four to five days, although he has gone up to six days without a bowel movement. His last bowel movement was yesterday, and he denies any blood or mucus in it.  He reports shoulder pain, which he describes as 'bad' and mentions needing surgery. However, his previous surgery was canceled, and he is concerned about whether an aneurysm will affect future surgery. He has not yet seen his cardiologist regarding this issue.  He mentions a previous hospital visit where his troponin levels were slightly elevated, reaching 25, but he was sent home shortly after.     Outpatient Medications Prior to Visit  Medication Sig Dispense Refill   albuterol  (VENTOLIN  HFA) 108 (90 Base) MCG/ACT inhaler Inhale 2 puffs into the lungs every 4 (four) hours as needed for wheezing or shortness of breath. 1 each 11   aspirin  EC 81 MG tablet Take 1 tablet (81 mg total) by mouth 2 (two) times daily. 30 tablet    DULoxetine  (CYMBALTA ) 30 MG capsule TAKE 1 CAPSULE BY MOUTH DAILY 90 capsule 3   furosemide  (LASIX ) 40 MG tablet TAKE 1 TABLET BY MOUTH DAILY 90 tablet 3    gabapentin  (NEURONTIN ) 300 MG capsule TAKE 1 CAPSULE BY MOUTH 3 TIMES  DAILY 270 capsule 3   levothyroxine  (SYNTHROID ) 125 MCG tablet TAKE 1 TABLET BY MOUTH DAILY 90 tablet 3   metoprolol  succinate (TOPROL -XL) 25 MG 24 hr tablet Take 1 tablet (25 mg total) by mouth daily. 90 tablet 2   Omega-3 Fatty Acids (SUPER OMEGA-3) 1000 MG CAPS Take 1 capsule by mouth daily at 6 (six) AM.     omeprazole  (PRILOSEC) 40 MG capsule TAKE 1 CAPSULE BY MOUTH DAILY 90 capsule 3   polyethylene glycol (MIRALAX  / GLYCOLAX ) 17 g packet Take 17 g by mouth daily as needed for moderate constipation.     potassium chloride  SA (KLOR-CON  M) 20 MEQ tablet TAKE 1 TABLET BY MOUTH DAILY 90 tablet 2   simvastatin  (ZOCOR ) 20 MG tablet TAKE 1 TABLET BY MOUTH DAILY AT  6 PM. 90 tablet 3   Tiotropium Bromide-Olodaterol (STIOLTO RESPIMAT ) 2.5-2.5 MCG/ACT AERS Inhale 2 puffs into the lungs daily. 4 g 3   No facility-administered medications prior to visit.    ROS Review of Systems  Constitutional:  Negative for appetite change, chills, diaphoresis, fatigue and fever.  HENT: Negative.    Eyes: Negative.   Respiratory:  Positive for shortness of breath. Negative for cough, chest tightness and wheezing.   Cardiovascular:  Negative for chest pain, palpitations and leg swelling.  Gastrointestinal:  Positive for constipation.  Negative for abdominal pain, blood in stool, diarrhea, nausea and vomiting.  Endocrine: Negative.   Genitourinary: Negative.  Negative for difficulty urinating and dysuria.  Musculoskeletal:  Positive for arthralgias, back pain and gait problem. Negative for joint swelling.  Skin: Negative.   Neurological:  Negative for dizziness, weakness and light-headedness.  Hematological:  Negative for adenopathy. Does not bruise/bleed easily.  Psychiatric/Behavioral:  Positive for decreased concentration and dysphoric mood. The patient is not nervous/anxious.     Objective:  BP 136/78 (BP Location: Left Arm, Patient  Position: Sitting, Cuff Size: Normal)   Pulse 76   Temp 98.4 F (36.9 C) (Oral)   Ht 5' 10 (1.778 m)   Wt 258 lb 3.2 oz (117.1 kg)   SpO2 97%   BMI 37.05 kg/m   BP Readings from Last 3 Encounters:  04/02/24 136/78  02/16/24 138/85  12/06/23 133/87    Wt Readings from Last 3 Encounters:  04/02/24 258 lb 3.2 oz (117.1 kg)  02/16/24 257 lb (116.6 kg)  12/06/23 254 lb (115.2 kg)    Physical Exam Vitals reviewed.  Constitutional:      General: He is not in acute distress.    Appearance: He is obese. He is ill-appearing. He is not toxic-appearing or diaphoretic.  HENT:     Mouth/Throat:     Mouth: Mucous membranes are moist.  Eyes:     General: No scleral icterus.    Conjunctiva/sclera: Conjunctivae normal.  Cardiovascular:     Rate and Rhythm: Normal rate and regular rhythm.     Heart sounds: No murmur heard.    No friction rub. No gallop.     Comments: EKG--- NSR, 69 bpm RBBB LAFB Unchanged  Pulmonary:     Effort: Pulmonary effort is normal.     Breath sounds: No stridor. No wheezing, rhonchi or rales.  Abdominal:     General: Abdomen is protuberant. Bowel sounds are normal. There is no distension.     Palpations: Abdomen is soft. There is no hepatomegaly, splenomegaly or mass.     Tenderness: There is no abdominal tenderness.     Hernia: No hernia is present.  Musculoskeletal:     Cervical back: Neck supple.     Right lower leg: No edema.     Left lower leg: No edema.  Lymphadenopathy:     Cervical: No cervical adenopathy.  Skin:    General: Skin is warm and dry.  Neurological:     General: No focal deficit present.     Mental Status: He is alert. Mental status is at baseline.  Psychiatric:        Mood and Affect: Mood normal.        Behavior: Behavior normal.     Lab Results  Component Value Date   WBC 4.9 04/02/2024   HGB 15.0 04/02/2024   HCT 45.0 04/02/2024   PLT 249.0 04/02/2024   GLUCOSE 89 04/02/2024   CHOL 122 04/02/2024   TRIG 152.0  (H) 04/02/2024   HDL 35.90 (L) 04/02/2024   LDLCALC 56 04/02/2024   ALT 21 04/02/2024   AST 23 04/02/2024   NA 139 04/02/2024   K 4.2 04/02/2024   CL 102 04/02/2024   CREATININE 0.75 04/02/2024   BUN 14 04/02/2024   CO2 24 04/02/2024   TSH 2.50 04/02/2024   PSA 0.25 09/22/2021   INR 1.03 07/29/2015   HGBA1C 6.2 04/02/2024    MYOCARDIAL PERFUSION/CT RAD READ Result Date: 12/09/2023 CLINICAL DATA:  This over-read does  not include interpretation of cardiac or coronary anatomy or pathology. The cardiac SPECT CT interpretation by the cardiologist is attached. COMPARISON:  10/04/2023 FINDINGS: Vascular: Limited without IV contrast. Aortic atherosclerosis noted. Aorta normal in caliber without aneurysm. No mediastinal hemorrhage or hematoma. Native coronary atherosclerosis noted. Normal heart size. No pericardial effusion. Mediastinum/Nodes: No bulky adenopathy. 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 There are similar subcentimeter punctate scattered small pulmonary nodules bilaterally in the right upper lobe, right middle lobe, and both lower lobes. These all measure 4 mm or less. No pleural abnormality, effusion, or pneumothorax. Upper Abdomen: Limited with artifact because of the arms positioned down for the scan. No acute upper abdominal finding appreciated. Musculoskeletal: Degenerative changes throughout the thoracic spine. IMPRESSION: 1. No acute extra cardiac finding by noncontrast CT. 2. Aortic atherosclerosis. 3. Asbestos related pleural disease. 4. Similar scattered small bilateral pulmonary nodules measuring 4 mm or less. No follow-up needed if patient is low-risk. Non-contrast chest CT can be considered in 12 months if patient is high-risk. This recommendation follows the consensus statement: Guidelines for Management of Incidental Pulmonary Nodules Detected on CT Images: From the Fleischner Society 2017;  Radiology 2017; 284:228-243. Aortic Atherosclerosis (ICD10-I70.0). Electronically Signed   By: CHRISTELLA.  Shick M.D.   On: 12/09/2023 21:20   MYOCARDIAL PERFUSION IMAGING Result Date: 12/06/2023   The study is normal. The study is low risk.   No ST deviation was noted.   LV perfusion is normal. There is no evidence of ischemia. There is no evidence of infarction.   Left ventricular function is normal. End diastolic cavity size is normal. End systolic cavity size is normal.   CT images were obtained for attenuation correction and were examined for the presence of coronary calcium when appropriate.   Coronary calcium was present on the attenuation correction CT images. Moderate coronary calcifications were present. Coronary calcifications were present in the left anterior descending artery distribution(s).   Prior study not available for comparison.   ECHOCARDIOGRAM COMPLETE Result Date: 12/06/2023    ECHOCARDIOGRAM REPORT   Patient Name:   CLAUDIA ALVIZO Date of Exam: 12/06/2023 Medical Rec #:  [id number]     Height:       70.0 in Accession #:    [id number]    Weight:       254.0 lb Date of Birth:  03-23-1946     BSA:          2.311 m Patient Age:    77 years      BP:           133/87 mmHg Patient Gender: M             HR:           61 bpm. Exam Location:  [address] Procedure: 2D Echo, 3D Echo, Cardiac Doppler, Color Doppler and Intracardiac            Opacification Agent (Both Spectral and Color Flow Doppler were            utilized during procedure). Indications:    I25.5 CAD  History:        Patient has prior history of Echocardiogram examinations, most                 recent 09/10/2020. Thoracic aneurysm, Signs/Symptoms:Shortness of                 Breath, Edema and Dyspnea; Risk Factors:Sleep  Apnea and                 Hypertension.  Sonographer:    Waldo Guadalajara RCS Referring Phys: [phone] JACKEE DEL, JR DICK  Sonographer Comments: Technically difficult study due to poor echo windows and suboptimal parasternal  window. IMPRESSIONS  1. Left ventricular ejection fraction, by estimation, is 55 to 60%. The left ventricle has normal function. The left ventricle has no regional wall motion abnormalities. Left ventricular diastolic parameters were normal.  2. Right ventricular systolic function is normal. The right ventricular size is normal. There is normal pulmonary artery systolic pressure.  3. The mitral valve is degenerative. No evidence of mitral valve regurgitation. No evidence of mitral stenosis.  4. The aortic valve was not well visualized. Aortic valve regurgitation is not visualized. No aortic stenosis is present.  5. Aortic Ascending aorta is structurally normal, with no evidence of dilitation.  6. The inferior vena cava is normal in size with greater than 50% respiratory variability, suggesting right atrial pressure of 3 mmHg. Comparison(s): A prior study was performed on 09/10/2020. Prior study reported aortic root at 42 mm and ascending aorta at 41mm. On current study parasternal windows are suboptimal consider different modality for evaluation. FINDINGS  Left Ventricle: Left ventricular ejection fraction, by estimation, is 55 to 60%. The left ventricle has normal function. The left ventricle has no regional wall motion abnormalities. The left ventricular internal cavity size was normal in size. There is  no left ventricular hypertrophy of the basal-septal segment. Left ventricular diastolic parameters were normal. Right Ventricle: The right ventricular size is normal. Right vetricular wall thickness was not well visualized. Right ventricular systolic function is normal. There is normal pulmonary artery systolic pressure. The tricuspid regurgitant velocity is 1.39 m/s, and with an assumed right atrial pressure of 3 mmHg, the estimated right ventricular systolic pressure is 10.7 mmHg. Left Atrium: Left atrial size was normal in size. Right Atrium: Right atrial size was normal in size. Pericardium: There is no  evidence of pericardial effusion. Mitral Valve: The mitral valve is degenerative in appearance. No evidence of mitral valve regurgitation. No evidence of mitral valve stenosis. Tricuspid Valve: The tricuspid valve is grossly normal. Tricuspid valve regurgitation is trivial. No evidence of tricuspid stenosis. Aortic Valve: The aortic valve was not well visualized. Aortic valve regurgitation is not visualized. No aortic stenosis is present. Pulmonic Valve: The pulmonic valve was not well visualized. Pulmonic valve regurgitation is not visualized. No evidence of pulmonic stenosis. Aorta: Aortic root could not be assessed and Ascending aorta is structurally normal, with no evidence of dilitation. Venous: The inferior vena cava is normal in size with greater than 50% respiratory variability, suggesting right atrial pressure of 3 mmHg. IAS/Shunts: There is redundancy of the interatrial septum. The atrial septum is grossly normal. Additional Comments: 3D was performed not requiring image post processing on an independent workstation and was normal.  LEFT VENTRICLE PLAX 2D LVIDd:         3.40 cm   Diastology LVIDs:         2.40 cm   LV e' medial:    6.42 cm/s LV PW:         1.00 cm   LV E/e' medial:  9.5 LV IVS:        1.10 cm   LV e' lateral:   7.40 cm/s LVOT diam:     2.00 cm   LV E/e' lateral: 8.3 LV SV:  96 LV SV Index:   41 LVOT Area:     3.14 cm                           3D Volume EF:                          3D EF:        63 %                          LV EDV:       136 ml                          LV ESV:       50 ml                          LV SV:        86 ml RIGHT VENTRICLE RV Basal diam:  3.50 cm RV S prime:     13.40 cm/s RVSP:           10.7 mmHg LEFT ATRIUM           Index        RIGHT ATRIUM           Index LA diam:      3.20 cm 1.38 cm/m   RA Pressure: 3.00 mmHg LA Vol (A2C): 42.5 ml 18.39 ml/m  RA Area:     13.50 cm LA Vol (A4C): 22.8 ml 9.87 ml/m   RA Volume:   32.30 ml  13.98 ml/m  AORTIC  VALVE LVOT Vmax:   138.00 cm/s LVOT Vmean:  101.000 cm/s LVOT VTI:    0.305 m  AORTA Ao Root diam: 3.40 cm Ao Asc diam:  3.70 cm MITRAL VALVE               TRICUSPID VALVE MV Area (PHT): 2.87 cm    TR Peak grad:   7.7 mmHg MV Decel Time: 264 msec    TR Vmax:        139.00 cm/s MV E velocity: 61.30 cm/s  Estimated RAP:  3.00 mmHg MV A velocity: 71.80 cm/s  RVSP:           10.7 mmHg MV E/A ratio:  0.85                            SHUNTS                            Systemic VTI:  0.30 m                            Systemic Diam: 2.00 cm Sunit Tolia Electronically signed by [employer] Signature Date/Time: 12/06/2023/11:56:00 AM    Final     Assessment & Plan:  Need for immunization against influenza -     Flu vaccine HIGH DOSE PF(Fluzone Trivalent)  Hyperlipidemia LDL goal <70- LDL goal achieved. Doing well on the statin  -     Lipid panel; Future -     TSH; Future -     Hepatic function panel; Future -     AMB Referral [facility] Care Management  Essential hypertension- BP is well controlled. -     Basic metabolic panel with GFR; Future -     TSH; Future -     EKG 12-Lead -     AMB Referral [facility] Care Management  B12 deficiency -     CBC with Differential/Platelet; Future  Acquired hypothyroidism- He is euthyroid. -     TSH; Future -     AMB Referral [facility] Care Management  Chronic idiopathic constipation -     Magnesium ; Future  Prediabetes -     Basic metabolic panel with GFR; Future -     Hemoglobin A1c; Future     Follow-up: Return in about 6 months (around 10/01/2024).  Debby Molt, MD "

## 2024-04-02 NOTE — Patient Instructions (Signed)
 Hypertension, Adult High blood pressure (hypertension) is when the force of blood pumping through the arteries is too strong. The arteries are the blood vessels that carry blood from the heart throughout the body. Hypertension forces the heart to work harder to pump blood and may cause arteries to become narrow or stiff. Untreated or uncontrolled hypertension can lead to a heart attack, heart failure, a stroke, kidney disease, and other problems. A blood pressure reading consists of a higher number over a lower number. Ideally, your blood pressure should be below 120/80. The first ("top") number is called the systolic pressure. It is a measure of the pressure in your arteries as your heart beats. The second ("bottom") number is called the diastolic pressure. It is a measure of the pressure in your arteries as the heart relaxes. What are the causes? The exact cause of this condition is not known. There are some conditions that result in high blood pressure. What increases the risk? Certain factors may make you more likely to develop high blood pressure. Some of these risk factors are under your control, including: Smoking. Not getting enough exercise or physical activity. Being overweight. Having too much fat, sugar, calories, or salt (sodium) in your diet. Drinking too much alcohol. Other risk factors include: Having a personal history of heart disease, diabetes, high cholesterol, or kidney disease. Stress. Having a family history of high blood pressure and high cholesterol. Having obstructive sleep apnea. Age. The risk increases with age. What are the signs or symptoms? High blood pressure may not cause symptoms. Very high blood pressure (hypertensive crisis) may cause: Headache. Fast or irregular heartbeats (palpitations). Shortness of breath. Nosebleed. Nausea and vomiting. Vision changes. Severe chest pain, dizziness, and seizures. How is this diagnosed? This condition is diagnosed by  measuring your blood pressure while you are seated, with your arm resting on a flat surface, your legs uncrossed, and your feet flat on the floor. The cuff of the blood pressure monitor will be placed directly against the skin of your upper arm at the level of your heart. Blood pressure should be measured at least twice using the same arm. Certain conditions can cause a difference in blood pressure between your right and left arms. If you have a high blood pressure reading during one visit or you have normal blood pressure with other risk factors, you may be asked to: Return on a different day to have your blood pressure checked again. Monitor your blood pressure at home for 1 week or longer. If you are diagnosed with hypertension, you may have other blood or imaging tests to help your health care provider understand your overall risk for other conditions. How is this treated? This condition is treated by making healthy lifestyle changes, such as eating healthy foods, exercising more, and reducing your alcohol intake. You may be referred for counseling on a healthy diet and physical activity. Your health care provider may prescribe medicine if lifestyle changes are not enough to get your blood pressure under control and if: Your systolic blood pressure is above 130. Your diastolic blood pressure is above 80. Your personal target blood pressure may vary depending on your medical conditions, your age, and other factors. Follow these instructions at home: Eating and drinking  Eat a diet that is high in fiber and potassium, and low in sodium, added sugar, and fat. An example of this eating plan is called the DASH diet. DASH stands for Dietary Approaches to Stop Hypertension. To eat this way: Eat  plenty of fresh fruits and vegetables. Try to fill one half of your plate at each meal with fruits and vegetables. Eat whole grains, such as whole-wheat pasta, brown rice, or whole-grain bread. Fill about one  fourth of your plate with whole grains. Eat or drink low-fat dairy products, such as skim milk or low-fat yogurt. Avoid fatty cuts of meat, processed or cured meats, and poultry with skin. Fill about one fourth of your plate with lean proteins, such as fish, chicken without skin, beans, eggs, or tofu. Avoid pre-made and processed foods. These tend to be higher in sodium, added sugar, and fat. Reduce your daily sodium intake. Many people with hypertension should eat less than 1,500 mg of sodium a day. Do not drink alcohol if: Your health care provider tells you not to drink. You are pregnant, may be pregnant, or are planning to become pregnant. If you drink alcohol: Limit how much you have to: 0-1 drink a day for women. 0-2 drinks a day for men. Know how much alcohol is in your drink. In the [place] one drink equals one 12 oz bottle of beer (355 mL), one 5 oz glass of wine (148 mL), or one 1 oz glass of hard liquor (44 mL). Lifestyle  Work with your health care provider to maintain a healthy body weight or to lose weight. Ask what an ideal weight is for you. Get at least 30 minutes of exercise that causes your heart to beat faster (aerobic exercise) most days of the week. Activities may include walking, swimming, or biking. Include exercise to strengthen your muscles (resistance exercise), such as Pilates or lifting weights, as part of your weekly exercise routine. Try to do these types of exercises for 30 minutes at least 3 days a week. Do not use any products that contain nicotine or tobacco. These products include cigarettes, chewing tobacco, and vaping devices, such as e-cigarettes. If you need help quitting, ask your health care provider. Monitor your blood pressure at home as told by your health care provider. Keep all follow-up visits. This is important. Medicines Take over-the-counter and prescription medicines only as told by your health care provider. Follow directions carefully. Blood  pressure medicines must be taken as prescribed. Do not skip doses of blood pressure medicine. Doing this puts you at risk for problems and can make the medicine less effective. Ask your health care provider about side effects or reactions to medicines that you should watch for. Contact a health care provider if you: Think you are having a reaction to a medicine you are taking. Have headaches that keep coming back (recurring). Feel dizzy. Have swelling in your ankles. Have trouble with your vision. Get help right away if you: Develop a severe headache or confusion. Have unusual weakness or numbness. Feel faint. Have severe pain in your chest or abdomen. Vomit repeatedly. Have trouble breathing. These symptoms may be an emergency. Get help right away. Call 911. Do not wait to see if the symptoms will go away. Do not drive yourself to the hospital. Summary Hypertension is when the force of blood pumping through your arteries is too strong. If this condition is not controlled, it may put you at risk for serious complications. Your personal target blood pressure may vary depending on your medical conditions, your age, and other factors. For most people, a normal blood pressure is less than 120/80. Hypertension is treated with lifestyle changes, medicines, or a combination of both. Lifestyle changes include losing weight, eating a healthy,  low-sodium diet, exercising more, and limiting alcohol. This information is not intended to replace advice given to you by your health care provider. Make sure you discuss any questions you have with your health care provider. Document Revised: 04/21/2021 Document Reviewed: 04/21/2021 Elsevier Patient Education  2024 [employer].

## 2024-04-03 LAB — LIPID PANEL
Cholesterol: 122 mg/dL (ref 0–200)
HDL: 35.9 mg/dL — ABNORMAL LOW (ref >39.00)
LDL Cholesterol: 56 mg/dL (ref 0–99)
NonHDL: 86.35
Total CHOL/HDL Ratio: 3
Triglycerides: 152 mg/dL — ABNORMAL HIGH (ref 0.0–149.0)
VLDL: 30.4 mg/dL (ref 0.0–40.0)

## 2024-04-05 ENCOUNTER — Telehealth: Payer: Self-pay | Admitting: *Deleted

## 2024-04-05 ENCOUNTER — Ambulatory Visit: Payer: Self-pay | Admitting: Internal Medicine

## 2024-04-05 NOTE — Progress Notes (Signed)
 Care Guide Pharmacy Note  04/05/2024 Name: JAYLENN ALTIER MRN: [id number] DOB: 05-26-1946  Referred By: Joshua Debby CROME, MD Reason for referral: Complex Care Management (Outreach to schedule referral with pharmacist )   AVAN GULLETT is a 78 y.o. year old male who is a primary care patient of Joshua Debby CROME, MD.  Levander JONETTA Bars was referred to the pharmacist for assistance related to: HTN  Successful contact was made with the patient to discuss pharmacy services including being ready for the pharmacist to call at least 5 minutes before the scheduled appointment time and to have medication bottles and any blood pressure readings ready for review. The patient agreed to meet with the pharmacist via telephone visit on (04/16/2024  Thedford Franks, CMA [place]  [facility] Guide Direct Dial: [phone]  Fax: [phone] Website: [email or web address]

## 2024-04-10 ENCOUNTER — Ambulatory Visit
Admission: RE | Admit: 2024-04-10 | Discharge: 2024-04-10 | Disposition: A | Source: Ambulatory Visit | Attending: Pulmonary Disease | Admitting: Pulmonary Disease

## 2024-04-10 DIAGNOSIS — R918 Other nonspecific abnormal finding of lung field: Secondary | ICD-10-CM

## 2024-04-16 ENCOUNTER — Other Ambulatory Visit: Admitting: Pharmacist

## 2024-04-16 ENCOUNTER — Ambulatory Visit: Payer: Self-pay | Admitting: Pulmonary Disease

## 2024-04-16 DIAGNOSIS — E039 Hypothyroidism, unspecified: Secondary | ICD-10-CM

## 2024-04-16 DIAGNOSIS — I10 Essential (primary) hypertension: Secondary | ICD-10-CM

## 2024-04-16 DIAGNOSIS — E785 Hyperlipidemia, unspecified: Secondary | ICD-10-CM

## 2024-04-16 NOTE — Progress Notes (Signed)
 04/16/2024 Name: [name] [name] MRN: [id number] DOB: 1946/05/21  Chief Complaint  Patient presents with   Hypertension   Hyperlipidemia   Medication Management   Hypothyroidism    [name] [name] is a 78 y.o. year old male who presented for a telephone visit.   They were referred to the pharmacist by their PCP for assistance in managing hypertension, hyperlipidemia/cardiovascular risk reduction, and hypothyroidism.    Subjective:  Care Team: Primary Care Provider: Joshua Debby CROME, MD ; Next Scheduled Visit: none scheduled   Medication Access/Adherence  Current Pharmacy:  [id number] [place], [place] - [address] [place] [address] Phone: [phone] Fax: [phone]  [facility] - [place], [place] - [address] [place] [address] Phone: [phone] Fax: [phone]   Patient reports affordability concerns with their medications: No  Patient reports access/transportation concerns to their pharmacy: No  Patient reports adherence concerns with their medications:  No     Hypertension:  Current medications: metoprolol  succinate 25 mg daily (CAD dx), furosemide  40 mg daily (lower extremity edema) Medications previously tried: hydrochlorothiazide , losartan   Patient has a validated, automated, upper arm home BP cuff Current blood pressure readings readings: none recent  Patient denies hypotensive s/sx including dizziness, lightheadedness.  Patient denies hypertensive symptoms including headache, chest pain, shortness of breath   Hyperlipidemia/ASCVD Risk Reduction  Current lipid lowering medications: simvastatin  20 mg daily Medications tried in the past:   Antiplatelet regimen: aspirin  81 mg daily  ASCVD History: mild CAD Risk Factors:    Hypothyroidism: Current medication: levothyroxine  125 mcg daily   Objective:  Lab Results   Component Value Date   HGBA1C 6.2 04/02/2024    Lab Results  Component Value Date   CREATININE 0.75 04/02/2024   BUN 14 04/02/2024   NA 139 04/02/2024   K 4.2 04/02/2024   CL 102 04/02/2024   CO2 24 04/02/2024    Lab Results  Component Value Date   CHOL 122 04/02/2024   HDL 35.90 (L) 04/02/2024   LDLCALC 56 04/02/2024   TRIG 152.0 (H) 04/02/2024   CHOLHDL 3 04/02/2024    Medications Reviewed Today     Reviewed by Merceda Lela SAUNDERS, RPH (Pharmacist) on 04/16/24 at 1617  Med List Status: <None>   Medication Order Taking? Sig Documenting Provider Last Dose Status Informant  albuterol  (VENTOLIN  HFA) 108 (90 Base) MCG/ACT inhaler [id number] Yes Inhale 2 puffs into the lungs every 4 (four) hours as needed for wheezing or shortness of breath. Alghanim, Fahid, MD  Active   aspirin  EC 81 MG tablet [id number] Yes Take 1 tablet (81 mg total) by mouth 2 (two) times daily. Okey Vina GAILS, MD  Active   DULoxetine  (CYMBALTA ) 30 MG capsule [id number] Yes TAKE 1 CAPSULE BY MOUTH DAILY Joshua Debby CROME, MD  Active   furosemide  (LASIX ) 40 MG tablet [id number] Yes TAKE 1 TABLET BY MOUTH DAILY Ross, Paula V, MD  Active   gabapentin  (NEURONTIN ) 300 MG capsule [id number] Yes TAKE 1 CAPSULE BY MOUTH 3 TIMES  DAILY Joshua Debby CROME, MD  Active   levothyroxine  (SYNTHROID ) 125 MCG tablet [id number] Yes TAKE 1 TABLET BY MOUTH DAILY Joshua Debby CROME, MD  Active   metoprolol  succinate (TOPROL -XL) 25 MG 24 hr tablet [id number] Yes Take 1 tablet (25 mg total) by mouth daily. Lucien Orren SAILOR, PA-C  Active   Omega-3 Fatty Acids (SUPER OMEGA-3)  1000 MG CAPS [id number] Yes Take 1 capsule by mouth daily at 6 (six) AM. [provider]  Active   omeprazole  (PRILOSEC) 40 MG capsule [id number] Yes TAKE 1 CAPSULE BY MOUTH DAILY Joshua Debby CROME, MD  Active   polyethylene glycol (MIRALAX  / GLYCOLAX ) 17 g packet [id number] Yes Take 17 g by mouth daily as needed for moderate constipation. [provider]  Active    potassium chloride  SA (KLOR-CON  M) 20 MEQ tablet [id number] Yes TAKE 1 TABLET BY MOUTH DAILY Ross, Paula V, MD  Active   simvastatin  (ZOCOR ) 20 MG tablet [id number] Yes TAKE 1 TABLET BY MOUTH DAILY AT  6 PM. Joshua Debby CROME, MD  Active   Tiotropium Bromide-Olodaterol (STIOLTO RESPIMAT ) 2.5-2.5 MCG/ACT AERS [id number] Yes Inhale 2 puffs into the lungs daily. Catherine Cools, MD  Active               Assessment/Plan:   Hypertension: - Currently borderline controlled, BP goal <130/80 - Reviewed long term cardiovascular and renal outcomes of uncontrolled blood pressure - Reviewed appropriate blood pressure monitoring technique and reviewed goal blood pressure. Recommended to check home blood pressure and heart rate and inform us  if BP is regularly above goal at home - Recommend to continue current regimen. Consider ACE/ARB is needed additional antihypertensive given hx of CAD    Hyperlipidemia/ASCVD Risk Reduction: - Currently controlled. LDL goal <70, TG <150 - Reviewed long term complications of uncontrolled cholesterol - Recommend to continue current regimen    Hypothyroidism: - Currently controlled. Continue current regimen.  Follow Up Plan: PRN, chronic conditions currently controlled.  Darrelyn Drum, PharmD, BCPS, CPP Clinical Pharmacist Practitioner [place] [facility] [phone]

## 2024-04-16 NOTE — Patient Instructions (Signed)
 It was a pleasure speaking with you today!  Continue your current regimen,  Monitor your blood pressure at home and let us  know if you are frequently seeing readings above 130/80 at home.  Feel free to call with any questions or concerns!  Darrelyn Drum, PharmD, BCPS, CPP Clinical Pharmacist Practitioner [place] [facility] [phone]

## 2024-05-16 NOTE — Progress Notes (Unsigned)
 Established Patient Pulmonology Office Visit   Subjective:  Patient ID: [name] [name], male    DOB: February 08, 1946  MRN: [id number]  CC: No chief complaint on file.   HPI  [name] [name] is a 78 y.o. 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  I last saw him on 02/16/24, at the time I advised him to lose weight. Ordered PFTs and repeat CT chest for lung nodules which was due 03/2024. After PFT results came out, I ordered Stiolto to be started for him.   {PULM QUESTIONNAIRES (Optional):33196}  ROS  {History (Optional):23778}  Current Outpatient Medications:    albuterol  (VENTOLIN  HFA) 108 (90 Base) MCG/ACT inhaler, Inhale 2 puffs into the lungs every 4 (four) hours as needed for wheezing or shortness of breath., Disp: 1 each, Rfl: 11   aspirin  EC 81 MG tablet, Take 1 tablet (81 mg total) by mouth 2 (two) times daily., Disp: 30 tablet, Rfl:    DULoxetine  (CYMBALTA ) 30 MG capsule, TAKE 1 CAPSULE BY MOUTH DAILY, Disp: 90 capsule, Rfl: 3   furosemide  (LASIX ) 40 MG tablet, TAKE 1 TABLET BY MOUTH DAILY, Disp: 90 tablet, Rfl: 3   gabapentin  (NEURONTIN ) 300 MG capsule, TAKE 1 CAPSULE BY MOUTH 3 TIMES  DAILY, Disp: 270 capsule, Rfl: 3   levothyroxine  (SYNTHROID ) 125 MCG tablet, TAKE 1 TABLET BY MOUTH DAILY, Disp: 90 tablet, Rfl: 3   metoprolol  succinate (TOPROL -XL) 25 MG 24 hr tablet, Take 1 tablet (25 mg total) by mouth daily., Disp: 90 tablet, Rfl: 2   Omega-3 Fatty Acids (SUPER OMEGA-3) 1000 MG CAPS, Take 1 capsule by mouth daily at 6 (six) AM., Disp: , Rfl:    omeprazole  (PRILOSEC) 40 MG capsule, TAKE 1 CAPSULE BY MOUTH DAILY, Disp: 90 capsule, Rfl: 3   polyethylene glycol (MIRALAX  / GLYCOLAX ) 17 g packet, Take 17 g by mouth daily as needed for moderate constipation., Disp: , Rfl:    potassium chloride  SA (KLOR-CON  M) 20 MEQ tablet, TAKE 1 TABLET BY MOUTH DAILY, Disp: 90 tablet, Rfl: 2   simvastatin   (ZOCOR ) 20 MG tablet, TAKE 1 TABLET BY MOUTH DAILY AT  6 PM., Disp: 90 tablet, Rfl: 3   Tiotropium Bromide-Olodaterol (STIOLTO RESPIMAT ) 2.5-2.5 MCG/ACT AERS, Inhale 2 puffs into the lungs daily., Disp: 4 g, Rfl: 3      Objective:  There were no vitals taken for this visit. {Pulm Vitals (Optional):32837}  Physical Exam   Diagnostic Review:  {Labs (Optional):32838}  PFT 02/2024: PRISM  + bronchodilator response.  CTA Chest 09/2023: has evidence of mosaic attenuation in all lobes as well as multiple pulmonary nodules all measuring 6 mm or less. He also has calcified pleural plaque bilaterally. There's evidence of bronchial wall thickening. These nodules have been present before on CT coronaries from 2023.  CT Chest 04/10/2024:  IMPRESSION: 1. Bronchitis / reactive airways in the lower lungs. 2. Stable multiple bilateral pulmonary nodules without new nodules. No additional follow up recommended.   Myocardial perfusion scan 11/2023   TTE 11/2023: 1. Left ventricular ejection fraction, by estimation, is 55 to 60%. The  left ventricle has normal function. The left ventricle has no regional  wall motion abnormalities. Left ventricular diastolic parameters were  normal.   2. Right ventricular systolic function is normal. The right ventricular  size is normal. There is normal pulmonary artery systolic pressure.   3. The mitral valve is degenerative. No evidence of mitral valve  regurgitation. No evidence of mitral stenosis.   4. The aortic valve was not well visualized. Aortic valve regurgitation  is not visualized. No aortic stenosis is present.   5. Aortic Ascending aorta is structurally normal, with no evidence of  dilitation.   6. The inferior vena cava is normal in size with greater than 50%  respiratory variability, suggesting right atrial pressure of 3 mmHg.     Assessment & Plan:   Assessment & Plan   No orders of the defined types were placed in this encounter.     No  follow-ups on file.   Jasmeen Fritsch, MD

## 2024-05-17 ENCOUNTER — Encounter (HOSPITAL_BASED_OUTPATIENT_CLINIC_OR_DEPARTMENT_OTHER): Payer: Self-pay | Admitting: Pulmonary Disease

## 2024-05-17 ENCOUNTER — Ambulatory Visit (HOSPITAL_BASED_OUTPATIENT_CLINIC_OR_DEPARTMENT_OTHER): Admitting: Pulmonary Disease

## 2024-05-17 VITALS — BP 152/83 | HR 71 | Ht 70.0 in | Wt 258.0 lb

## 2024-05-17 DIAGNOSIS — J42 Unspecified chronic bronchitis: Secondary | ICD-10-CM | POA: Diagnosis not present

## 2024-05-17 DIAGNOSIS — R918 Other nonspecific abnormal finding of lung field: Secondary | ICD-10-CM | POA: Diagnosis not present

## 2024-05-17 DIAGNOSIS — I5032 Chronic diastolic (congestive) heart failure: Secondary | ICD-10-CM

## 2024-05-17 DIAGNOSIS — Z23 Encounter for immunization: Secondary | ICD-10-CM

## 2024-05-17 MED ORDER — ALBUTEROL SULFATE 1.25 MG/3ML IN NEBU: 1.0000 | INHALATION_SOLUTION | Freq: Four times a day (QID) | RESPIRATORY_TRACT | 12 refills | Status: DC | PRN

## 2024-05-17 MED ORDER — SODIUM CHLORIDE 3 % IN NEBU: INHALATION_SOLUTION | RESPIRATORY_TRACT | 12 refills | Status: DC | PRN

## 2024-05-17 NOTE — Addendum Note (Signed)
 Addended by: ALINDA WINN KIDD on: 05/17/2024 09:25 AM   Modules accepted: Orders

## 2024-05-17 NOTE — Patient Instructions (Signed)
  VISIT SUMMARY: You came in for a follow-up on your lung nodules and persistent cough. We discussed your stable pulmonary nodules, chronic bronchitis, and leg swelling related to heart failure. We made some adjustments to your treatment plan to help manage your symptoms better.  YOUR PLAN: STABLE MULTIPLE PULMONARY NODULES: Your lung nodules are stable with no new growth. The sizes remain unchanged. -Continue with annual CT scans. Your next scan is scheduled for October next year.  CHRONIC BRONCHITIS: You have mild chronic bronchitis with a persistent cough and mucus production. Your pulmonary function tests indicate mild COPD. -Continue using the Stiolto inhaler, 2 puffs in the morning. -We have ordered a nebulizer machine and treatments with albuterol  and saline for you to use twice daily as needed. -Use the flutter valve after your nebulizer treatments to help clear mucus. -You received the Prevnar 20 vaccine today to help protect against pneumonia.  CHRONIC DIASTOLIC HEART FAILURE: You are experiencing leg swelling, which decreases with elevation but returns when you stand. This may indicate that your current dose of furosemide  is insufficient. -Increase your furosemide  dose to two tablets if you notice significant swelling or if your weight increases by 2 pounds overnight or 5 pounds within a week. -Continue to monitor your weight and adjust your furosemide  dose as needed. -Keep taking your potassium supplement.  Contains text generated by [employer].

## 2024-05-24 ENCOUNTER — Other Ambulatory Visit: Payer: Self-pay | Admitting: Pulmonary Disease

## 2024-05-24 DIAGNOSIS — J449 Chronic obstructive pulmonary disease, unspecified: Secondary | ICD-10-CM

## 2024-05-30 ENCOUNTER — Other Ambulatory Visit (HOSPITAL_BASED_OUTPATIENT_CLINIC_OR_DEPARTMENT_OTHER): Payer: Self-pay

## 2024-05-30 DIAGNOSIS — J42 Unspecified chronic bronchitis: Secondary | ICD-10-CM

## 2024-05-30 MED ORDER — SODIUM CHLORIDE 3 % IN NEBU: INHALATION_SOLUTION | RESPIRATORY_TRACT | 12 refills | Status: AC

## 2024-06-01 ENCOUNTER — Telehealth (HOSPITAL_BASED_OUTPATIENT_CLINIC_OR_DEPARTMENT_OTHER): Payer: Self-pay

## 2024-06-01 NOTE — Telephone Encounter (Signed)
 CMN received for flutter valve sent to [facility] signed by provider and faxed confirmation received

## 2024-06-26 ENCOUNTER — Other Ambulatory Visit (HOSPITAL_BASED_OUTPATIENT_CLINIC_OR_DEPARTMENT_OTHER): Payer: Self-pay

## 2024-06-26 DIAGNOSIS — J449 Chronic obstructive pulmonary disease, unspecified: Secondary | ICD-10-CM

## 2024-06-26 DIAGNOSIS — J42 Unspecified chronic bronchitis: Secondary | ICD-10-CM

## 2024-06-26 MED ORDER — ALBUTEROL SULFATE 1.25 MG/3ML IN NEBU: 1.0000 | INHALATION_SOLUTION | Freq: Four times a day (QID) | RESPIRATORY_TRACT | 12 refills | Status: AC | PRN

## 2024-06-26 MED ORDER — STIOLTO RESPIMAT 2.5-2.5 MCG/ACT IN AERS: 2.0000 | INHALATION_SPRAY | Freq: Every day | RESPIRATORY_TRACT | 12 refills | Status: AC

## 2024-07-02 ENCOUNTER — Other Ambulatory Visit: Payer: Self-pay | Admitting: Internal Medicine

## 2024-07-02 DIAGNOSIS — I700 Atherosclerosis of aorta: Secondary | ICD-10-CM

## 2024-07-02 DIAGNOSIS — K2210 Ulcer of esophagus without bleeding: Secondary | ICD-10-CM

## 2024-07-02 DIAGNOSIS — E039 Hypothyroidism, unspecified: Secondary | ICD-10-CM

## 2024-07-02 DIAGNOSIS — E785 Hyperlipidemia, unspecified: Secondary | ICD-10-CM

## 2024-07-02 NOTE — Telephone Encounter (Signed)
 Copied from CRM [id number]. Topic: Clinical - Medication Refill >> Jul 02, 2024  3:49 PM Berneda F wrote: Medication: DULoxetine  (CYMBALTA ) 30 MG capsule levothyroxine  (SYNTHROID ) 125 MCG tablet omeprazole  (PRILOSEC) 40 MG capsule simvastatin  (ZOCOR ) 20 MG tablet  Has the patient contacted their pharmacy? Yes (Agent: If no, request that the patient contact the pharmacy for the refill. If patient does not wish to contact the pharmacy document the reason why and proceed with request.) (Agent: If yes, when and what did the pharmacy advise?)  This is the patient's preferred pharmacy:   [facility] - [place], [place] - [address] [place] [address] Phone: [phone] Fax: [phone]  Is this the correct pharmacy for this prescription? Yes If no, delete pharmacy and type the correct one.   Has the prescription been filled recently? No  Is the patient out of the medication? No  Has the patient been seen for an appointment in the last year OR does the patient have an upcoming appointment? Yes  Can we respond through MyChart? Yes  Agent: Please be advised that Rx refills may take up to 3 business days. We ask that you follow-up with your pharmacy.

## 2024-07-05 MED ORDER — OMEPRAZOLE 40 MG PO CPDR: 40.0000 mg | DELAYED_RELEASE_CAPSULE | Freq: Every day | ORAL | 3 refills | Status: DC

## 2024-07-05 MED ORDER — SIMVASTATIN 20 MG PO TABS: 20.0000 mg | ORAL_TABLET | Freq: Every day | ORAL | 3 refills | Status: DC

## 2024-07-05 MED ORDER — LEVOTHYROXINE SODIUM 125 MCG PO TABS: 125.0000 ug | ORAL_TABLET | Freq: Every day | ORAL | 3 refills | Status: DC

## 2024-07-05 MED ORDER — DULOXETINE HCL 30 MG PO CPEP: 30.0000 mg | ORAL_CAPSULE | Freq: Every day | ORAL | 3 refills | Status: AC

## 2024-07-11 NOTE — Progress Notes (Signed)
 "   Cardiology Office Note   Date:  07/12/2024   ID:  [name] [name], DOB 10-01-1945, MRN [id number]  PCP:  Joshua Debby CROME, MD  Cardiologist:   Vina Gull, MD   F/U of CAD     History of Present Illness: [name] [name] is a 79 y.o. male with a history of  CAD, HTN, HL, mild aortic dilitation, OSA, venous insufficiency  2023  CCTA  Ca score 591   Nonobstructive CAD  Mild LAD plaquing Minimal LCx plaquing     Feb 2024  PT seen in clinic   BP low   Meds held     May 2024  BP improved     PT seen by E Dick  Complained of SOB June 2025  Echo done   LVEF normal  June 2025  Myoview  done   NOrmal   Since seen the pt notes occasional CP  SOmetimes spells can last most of day    No association with activity   Breathing is relatively stable now   Has hx of COPD   Can go up and doen  Pt also notes some swelling in legs   Says lasix  isn't helping   Will wake up with some swelling    Current Meds  Medication Sig   albuterol  (ACCUNEB ) 1.25 MG/3ML nebulizer solution Take 3 mLs (1.25 mg total) by nebulization every 6 (six) hours as needed for wheezing.   albuterol  (VENTOLIN  HFA) 108 (90 Base) MCG/ACT inhaler Inhale 2 puffs into the lungs every 4 (four) hours as needed for wheezing or shortness of breath.   aspirin  EC 81 MG tablet Take 1 tablet (81 mg total) by mouth 2 (two) times daily.   DULoxetine  (CYMBALTA ) 30 MG capsule Take 1 capsule (30 mg total) by mouth daily.   gabapentin  (NEURONTIN ) 300 MG capsule TAKE 1 CAPSULE BY MOUTH 3 TIMES  DAILY   levothyroxine  (SYNTHROID ) 125 MCG tablet Take 1 tablet (125 mcg total) by mouth daily.   metoprolol  succinate (TOPROL -XL) 25 MG 24 hr tablet Take 1 tablet (25 mg total) by mouth daily.   Omega-3 Fatty Acids (SUPER OMEGA-3) 1000 MG CAPS Take 1 capsule by mouth daily at 6 (six) AM.   omeprazole  (PRILOSEC) 40 MG capsule Take 1 capsule (40 mg total) by mouth daily.   polyethylene glycol (MIRALAX  / GLYCOLAX ) 17 g packet Take 17 g by mouth daily as needed for  moderate constipation.   simvastatin  (ZOCOR ) 20 MG tablet Take 1 tablet (20 mg total) by mouth daily at 6 PM.   sodium chloride  HYPERTONIC 3 % nebulizer solution Use 4ml via nebulizer two times per day as needed   Tiotropium Bromide-Olodaterol (STIOLTO RESPIMAT ) 2.5-2.5 MCG/ACT AERS Inhale 2 puffs into the lungs daily.   [DISCONTINUED] furosemide  (LASIX ) 40 MG tablet TAKE 1 TABLET BY MOUTH DAILY   [DISCONTINUED] potassium chloride  SA (KLOR-CON  M) 20 MEQ tablet TAKE 1 TABLET BY MOUTH DAILY     Allergies:   Penicillins, Codeine, and Sulfonamide derivatives   Past Medical History:  Diagnosis Date   Arthritis    CAD (coronary artery disease)    cath 3/10: mLAD 30%, pRCA 30%, EF 55-60% Dr. Gull cardiologist  most recent  stress stress done ~ 2 years ago with Dr. Gull   Complication of anesthesia    he usually gets an ileus after back OR    Depression    Dyspnea    -PFTs compeltely normal 03/20/08 including DLc0   Environmental allergies  Dust, Smoke   GERD (gastroesophageal reflux disease)    HTN (hypertension)    Hypothyroidism    Low testosterone     Observed sleep apnea    can't wear cpap   Polycythemia    PONV (postoperative nausea and vomiting)    Pulmonary nodule 06/13/2012   Rosacea    Sleep apnea    Most recent sleep study 2010; records at [facility]    Past Surgical History:  Procedure Laterality Date   BACK SURGERY  2005; 06/2006; 01/2007; 04/2009   CARDIAC CATHETERIZATION  08/2008   CATARACT EXTRACTION, BILATERAL  2010   CERVICAL FUSION  04/2002   ?C3-4   COLONOSCOPY  12/2005   normal screening study.    ESOPHAGOGASTRODUODENOSCOPY  multiple   last 07/2012 GERD esophagitis, 48 Fr dilation   ESOPHAGOGASTRODUODENOSCOPY N/A 09/19/2015   Procedure: ESOPHAGOGASTRODUODENOSCOPY (EGD);  Surgeon: Gwendlyn ONEIDA Buddy, MD;  Location: [facility] ENDOSCOPY;  Service: Endoscopy;  Laterality: N/A;   KNEE ARTHROSCOPY WITH MEDIAL MENISECTOMY Right 05/14/2015   Procedure: RIGHT KNEE  ARTHROSCOPY CHONDROPLASTY, PARTIAL  MEDIAL MENISECTOMY;  Surgeon: Toribio Silos, MD;  Location: [facility];  Service: Orthopedics;  Laterality: Right;   left knee replacement     left shoudler clean     LUMBAR DISC SURGERY  06/2006   L3   MENISCUS REPAIR  08/2008   left   NECK SURGERY     POSTERIOR FUSION LUMBAR SPINE  2005; 01/2007; 04/2009   L5; L3-4; L2-3   PROSTATE SURGERY  2000   SAVORY DILATION N/A 09/19/2015   Procedure: SAVORY DILATION;  Surgeon: Gwendlyn ONEIDA Buddy, MD;  Location: [facility] ENDOSCOPY;  Service: Endoscopy;  Laterality: N/A;   SHOULDER OPEN ROTATOR CUFF REPAIR Left 1999   left   TONSILLECTOMY  09/21/11   TOTAL KNEE ARTHROPLASTY Left 01/2011   left   TOTAL KNEE ARTHROPLASTY Right 08/08/2015   Procedure: RIGHT TOTAL KNEE ARTHROPLASTY;  Surgeon: Toribio Silos, MD;  Location: [facility] OR;  Service: Orthopedics;  Laterality: Right;   TRANSURETHRAL RESECTION OF PROSTATE  2006   followed by surgery to get rid of clots   UVULOPALATOPHARYNGOPLASTY, TONSILLECTOMY AND SEPTOPLASTY  09/21/11   Deviated Septum     Social History:  The patient  reports that he quit smoking about 50 years ago. His smoking use included cigarettes. He started smoking about 66 years ago. He has a 16 pack-year smoking history. He has quit using smokeless tobacco. He reports that he does not drink alcohol and does not use drugs.   Family History:  The patient's family history includes Alzheimer's disease in his cousin and father; Bipolar disorder in an other family member; Breast cancer in his mother and sister; Breast cancer (age of onset: 51) in his sister; Cirrhosis in his maternal uncle; Colon polyps in his mother; Diabetes in his mother and sister; Heart Problems in his sister; Heart attack in his cousin, father, and paternal grandfather; Liver cancer in his maternal uncle; Lung cancer in his paternal uncle, paternal uncle, and paternal uncle; Other in his daughter and sister; Parkinson's disease in an  other family member; Stroke in his paternal grandmother; Throat cancer in his maternal uncle.    ROS:  Please see the history of present illness. All other systems are reviewed and  Negative to the above problem except as noted.    PHYSICAL EXAM: VS:  BP 110/60 (BP Location: Left Arm, Patient Position: Sitting, Cuff Size: Normal)   Pulse 68   Ht 5' 10 (1.778  m)   Wt 258 lb (117 kg)   SpO2 96%   BMI 37.02 kg/m     GEN: Obese 79 yo in no acute distress  HEENT: NCAT   Neck: JVP is not elevated  Cardiac: RRR; No signif murmurs  Respiratory:  CTA No rales  GI: Obese. Nontender. Ext  1+ LE edema   Soft      EKG:  EKG not done today  Myoview    June 2025    The study is normal. The study is low risk.   No ST deviation was noted.   LV perfusion is normal. There is no evidence of ischemia. There is no evidence of infarction.   Left ventricular function is normal. End diastolic cavity size is normal. End systolic cavity size is normal.   CT images were obtained for attenuation correction and were examined for the presence of coronary calcium when appropriate.   Coronary calcium was present on the attenuation correction CT images. Moderate coronary calcifications were present. Coronary calcifications were present in the left anterior descending artery distribution(s).   Prior study not available for comparison.    Echo  June 2025  1. Left ventricular ejection fraction, by estimation, is 55 to 60%. The  left ventricle has normal function. The left ventricle has no regional  wall motion abnormalities. Left ventricular diastolic parameters were  normal.   2. Right ventricular systolic function is normal. The right ventricular  size is normal. There is normal pulmonary artery systolic pressure.   3. The mitral valve is degenerative. No evidence of mitral valve  regurgitation. No evidence of mitral stenosis.   4. The aortic valve was not well visualized. Aortic valve regurgitation  is  not visualized. No aortic stenosis is present.   5. Aortic Ascending aorta is structurally normal, with no evidence of  dilitation.   6. The inferior vena cava is normal in size with greater than 50%  respiratory variability, suggesting right atrial pressure of 3 mmHg.   Comparison(s): A prior study was performed on 09/10/2020. Prior study  reported aortic root at 42 mm and ascending aorta at 41mm. On current  study parasternal windows are suboptimal consider different modality for  evaluation.       Component Value Date/Time   CHOL 122 04/02/2024 1042   CHOL 107 08/26/2021 0826   TRIG 152.0 (H) 04/02/2024 1042   HDL 35.90 (L) 04/02/2024 1042   HDL 32 (L) 08/26/2021 0826   CHOLHDL 3 04/02/2024 1042   VLDL 30.4 04/02/2024 1042   LDLCALC 56 04/02/2024 1042   LDLCALC 56 08/26/2021 0826      Wt Readings from Last 3 Encounters:  07/12/24 258 lb (117 kg)  05/17/24 258 lb (117 kg)  04/02/24 258 lb 3.2 oz (117.1 kg)      ASSESSMENT AND PLAN:   2   CAD  Mild dz on CT coronary angiogram in March 2023     Myoview  in June 2025 showed no ischemia    Pt with CP that seems atypical  ? GI   Follow   2  Hx HTN  BP is well controlled   3  Aortic dilitation  Mild on CT scan    Follow periodically over time   NO symptoms of angina  4  LE edema   PT can increase lasix  to 80 mg 2x per week with extra KCL    Check labs in 3 wks    Limit sodium  5  Lipids.In Oct 2025  LDL 56  HDL 36  Trig 152  Keep on simvistatin   Watch diet      6  MEtabolics  A1C 6.2 in Oct 2025  Limit carbs, sugars.   Current medicines are reviewed at length with the patient today.  The patient does not have concerns regarding medicines.  Signed, Vina Gull, MD  "

## 2024-07-12 ENCOUNTER — Ambulatory Visit: Attending: Internal Medicine | Admitting: Internal Medicine

## 2024-07-12 VITALS — BP 110/60 | HR 68 | Ht 70.0 in | Wt 258.0 lb

## 2024-07-12 DIAGNOSIS — R600 Localized edema: Secondary | ICD-10-CM

## 2024-07-12 DIAGNOSIS — Z79899 Other long term (current) drug therapy: Secondary | ICD-10-CM | POA: Diagnosis not present

## 2024-07-12 DIAGNOSIS — I517 Cardiomegaly: Secondary | ICD-10-CM | POA: Diagnosis not present

## 2024-07-12 MED ORDER — FUROSEMIDE 40 MG PO TABS: 40.0000 mg | ORAL_TABLET | Freq: Every day | ORAL | 3 refills | Status: DC

## 2024-07-12 MED ORDER — POTASSIUM CHLORIDE CRYS ER 20 MEQ PO TBCR: 40.0000 meq | EXTENDED_RELEASE_TABLET | Freq: Every day | ORAL | 2 refills | Status: DC

## 2024-07-12 NOTE — Patient Instructions (Signed)
 Medication Instructions:  Twice a week, INCREASE your dose of lasix  to 80 mg. You can do this by taking a 40 mg tablet in the morning and taking another 40 mg tablet in the afternoon. Take a 40 MEq dose of potassium with your extra lasix  dose.   *If you need a refill on your cardiac medications before your next appointment, please call your pharmacy*  Lab Work: Please go to any [facility] to complete a BMET and a BNP in 3 weeks. You do not need to be fasting for these labs.  If you have labs (blood work) drawn today and your tests are completely normal, you will receive your results only by: MyChart Message (if you have MyChart) OR A paper copy in the mail If you have any lab test that is abnormal or we need to change your treatment, we will call you to review the results.  Testing/Procedures: None.  Follow-Up: At [facility], you and your health needs are our priority.  As part of our continuing mission to provide you with exceptional heart care, our providers are all part of one team.  This team includes your primary Cardiologist (physician) and Advanced Practice Providers or APPs (Physician Assistants and Nurse Practitioners) who all work together to provide you with the care you need, when you need it.  Your next appointment:   August 2026  Provider:   Vina Gull, MD

## 2024-07-23 ENCOUNTER — Other Ambulatory Visit: Payer: Self-pay | Admitting: Internal Medicine

## 2024-07-23 DIAGNOSIS — E039 Hypothyroidism, unspecified: Secondary | ICD-10-CM

## 2024-07-23 DIAGNOSIS — K2210 Ulcer of esophagus without bleeding: Secondary | ICD-10-CM

## 2024-07-23 DIAGNOSIS — I700 Atherosclerosis of aorta: Secondary | ICD-10-CM

## 2024-07-23 DIAGNOSIS — E785 Hyperlipidemia, unspecified: Secondary | ICD-10-CM

## 2024-07-24 ENCOUNTER — Other Ambulatory Visit: Payer: Self-pay | Admitting: Physician Assistant

## 2024-07-25 ENCOUNTER — Encounter: Payer: Self-pay | Admitting: Orthopaedic Surgery

## 2024-07-25 ENCOUNTER — Other Ambulatory Visit: Payer: Self-pay | Admitting: Orthopaedic Surgery

## 2024-07-25 DIAGNOSIS — M12811 Other specific arthropathies, not elsewhere classified, right shoulder: Secondary | ICD-10-CM

## 2024-07-27 MED ORDER — METOPROLOL SUCCINATE ER 25 MG PO TB24: 25.0000 mg | ORAL_TABLET | Freq: Every day | ORAL | 2 refills | Status: AC

## 2024-08-14 ENCOUNTER — Other Ambulatory Visit

## 2024-10-18 ENCOUNTER — Ambulatory Visit
